# Patient Record
Sex: Male | Born: 1970
Health system: Southern US, Community
[De-identification: ages and names within clinical notes are randomized; demographics above are authoritative.]

## PROBLEM LIST (undated history)

## (undated) DIAGNOSIS — I10 Essential (primary) hypertension: Secondary | ICD-10-CM

## (undated) DIAGNOSIS — R0600 Dyspnea, unspecified: Secondary | ICD-10-CM

## (undated) DIAGNOSIS — N289 Disorder of kidney and ureter, unspecified: Secondary | ICD-10-CM

## (undated) DIAGNOSIS — E2740 Unspecified adrenocortical insufficiency: Secondary | ICD-10-CM

## (undated) DIAGNOSIS — D649 Anemia, unspecified: Secondary | ICD-10-CM

## (undated) DIAGNOSIS — T7840XA Allergy, unspecified, initial encounter: Secondary | ICD-10-CM

## (undated) DIAGNOSIS — Z9289 Personal history of other medical treatment: Secondary | ICD-10-CM

## (undated) DIAGNOSIS — K219 Gastro-esophageal reflux disease without esophagitis: Secondary | ICD-10-CM

## (undated) DIAGNOSIS — I071 Rheumatic tricuspid insufficiency: Secondary | ICD-10-CM

## (undated) DIAGNOSIS — N186 End stage renal disease: Secondary | ICD-10-CM

## (undated) DIAGNOSIS — N19 Unspecified kidney failure: Secondary | ICD-10-CM

## (undated) DIAGNOSIS — F419 Anxiety disorder, unspecified: Secondary | ICD-10-CM

## (undated) DIAGNOSIS — E7800 Pure hypercholesterolemia, unspecified: Secondary | ICD-10-CM

## (undated) DIAGNOSIS — Z992 Dependence on renal dialysis: Secondary | ICD-10-CM

## (undated) DIAGNOSIS — F32A Depression, unspecified: Secondary | ICD-10-CM

## (undated) DIAGNOSIS — I639 Cerebral infarction, unspecified: Secondary | ICD-10-CM

## (undated) DIAGNOSIS — I5020 Unspecified systolic (congestive) heart failure: Secondary | ICD-10-CM

## (undated) HISTORY — PX: INSERTION OF DIALYSIS CATHETER: SHX1324

## (undated) HISTORY — DX: Allergy, unspecified, initial encounter: T78.40XA

## (undated) HISTORY — DX: Anxiety disorder, unspecified: F41.9

## (undated) HISTORY — PX: AV FISTULA PLACEMENT: SHX1204

## (undated) HISTORY — PX: THYROIDECTOMY, PARTIAL: SHX18

## (undated) HISTORY — DX: Depression, unspecified: F32.A

---

## 2000-08-16 ENCOUNTER — Inpatient Hospital Stay (HOSPITAL_COMMUNITY): Admission: EM | Admit: 2000-08-16 | Discharge: 2000-08-21 | Payer: Self-pay | Admitting: Emergency Medicine

## 2000-08-17 ENCOUNTER — Encounter: Payer: Self-pay | Admitting: Internal Medicine

## 2000-08-18 ENCOUNTER — Encounter: Payer: Self-pay | Admitting: Internal Medicine

## 2000-12-24 ENCOUNTER — Ambulatory Visit (HOSPITAL_COMMUNITY): Admission: RE | Admit: 2000-12-24 | Discharge: 2000-12-24 | Payer: Self-pay | Admitting: *Deleted

## 2001-04-10 ENCOUNTER — Emergency Department (HOSPITAL_COMMUNITY): Admission: EM | Admit: 2001-04-10 | Discharge: 2001-04-10 | Payer: Self-pay | Admitting: Orthopedic Surgery

## 2002-12-07 ENCOUNTER — Encounter: Payer: Self-pay | Admitting: Nephrology

## 2002-12-07 ENCOUNTER — Encounter: Admission: RE | Admit: 2002-12-07 | Discharge: 2002-12-07 | Payer: Self-pay | Admitting: Nephrology

## 2003-02-19 ENCOUNTER — Emergency Department (HOSPITAL_COMMUNITY): Admission: EM | Admit: 2003-02-19 | Discharge: 2003-02-19 | Payer: Self-pay | Admitting: Emergency Medicine

## 2003-02-20 ENCOUNTER — Emergency Department (HOSPITAL_COMMUNITY): Admission: EM | Admit: 2003-02-20 | Discharge: 2003-02-20 | Payer: Self-pay | Admitting: Emergency Medicine

## 2003-07-28 ENCOUNTER — Emergency Department (HOSPITAL_COMMUNITY): Admission: EM | Admit: 2003-07-28 | Discharge: 2003-07-28 | Payer: Self-pay | Admitting: Emergency Medicine

## 2003-08-01 ENCOUNTER — Inpatient Hospital Stay (HOSPITAL_COMMUNITY): Admission: EM | Admit: 2003-08-01 | Discharge: 2003-08-04 | Payer: Self-pay | Admitting: Emergency Medicine

## 2003-08-10 ENCOUNTER — Ambulatory Visit (HOSPITAL_COMMUNITY): Admission: RE | Admit: 2003-08-10 | Discharge: 2003-08-10 | Payer: Self-pay | Admitting: Internal Medicine

## 2012-10-04 DIAGNOSIS — L03114 Cellulitis of left upper limb: Secondary | ICD-10-CM | POA: Insufficient documentation

## 2012-10-05 DIAGNOSIS — I82629 Acute embolism and thrombosis of deep veins of unspecified upper extremity: Secondary | ICD-10-CM | POA: Insufficient documentation

## 2012-10-06 DIAGNOSIS — I2699 Other pulmonary embolism without acute cor pulmonale: Secondary | ICD-10-CM | POA: Insufficient documentation

## 2012-10-11 DIAGNOSIS — G43909 Migraine, unspecified, not intractable, without status migrainosus: Secondary | ICD-10-CM | POA: Insufficient documentation

## 2013-03-30 DIAGNOSIS — A419 Sepsis, unspecified organism: Secondary | ICD-10-CM | POA: Insufficient documentation

## 2014-03-03 DIAGNOSIS — R634 Abnormal weight loss: Secondary | ICD-10-CM | POA: Insufficient documentation

## 2014-03-18 DIAGNOSIS — R590 Localized enlarged lymph nodes: Secondary | ICD-10-CM | POA: Insufficient documentation

## 2014-03-18 DIAGNOSIS — D638 Anemia in other chronic diseases classified elsewhere: Secondary | ICD-10-CM | POA: Insufficient documentation

## 2014-03-18 DIAGNOSIS — E871 Hypo-osmolality and hyponatremia: Secondary | ICD-10-CM | POA: Insufficient documentation

## 2014-07-05 DIAGNOSIS — M87059 Idiopathic aseptic necrosis of unspecified femur: Secondary | ICD-10-CM | POA: Insufficient documentation

## 2014-07-05 DIAGNOSIS — K56609 Unspecified intestinal obstruction, unspecified as to partial versus complete obstruction: Secondary | ICD-10-CM | POA: Insufficient documentation

## 2014-07-05 DIAGNOSIS — Z5181 Encounter for therapeutic drug level monitoring: Secondary | ICD-10-CM | POA: Insufficient documentation

## 2014-07-05 DIAGNOSIS — Z7901 Long term (current) use of anticoagulants: Secondary | ICD-10-CM

## 2014-07-05 DIAGNOSIS — R29898 Other symptoms and signs involving the musculoskeletal system: Secondary | ICD-10-CM | POA: Insufficient documentation

## 2014-11-04 DIAGNOSIS — I639 Cerebral infarction, unspecified: Secondary | ICD-10-CM

## 2014-11-04 HISTORY — PX: OTHER SURGICAL HISTORY: SHX169

## 2014-11-04 HISTORY — DX: Cerebral infarction, unspecified: I63.9

## 2014-11-21 DIAGNOSIS — R4182 Altered mental status, unspecified: Secondary | ICD-10-CM | POA: Diagnosis present

## 2014-11-21 DIAGNOSIS — N186 End stage renal disease: Secondary | ICD-10-CM | POA: Diagnosis present

## 2014-11-21 DIAGNOSIS — E162 Hypoglycemia, unspecified: Secondary | ICD-10-CM | POA: Diagnosis present

## 2015-07-13 DIAGNOSIS — S4412XA Injury of median nerve at upper arm level, left arm, initial encounter: Secondary | ICD-10-CM | POA: Insufficient documentation

## 2015-07-24 DIAGNOSIS — I2510 Atherosclerotic heart disease of native coronary artery without angina pectoris: Secondary | ICD-10-CM | POA: Insufficient documentation

## 2015-11-06 DIAGNOSIS — D631 Anemia in chronic kidney disease: Secondary | ICD-10-CM | POA: Diagnosis not present

## 2015-11-06 DIAGNOSIS — N186 End stage renal disease: Secondary | ICD-10-CM | POA: Diagnosis not present

## 2015-11-06 DIAGNOSIS — N2581 Secondary hyperparathyroidism of renal origin: Secondary | ICD-10-CM | POA: Diagnosis not present

## 2015-11-06 DIAGNOSIS — Z992 Dependence on renal dialysis: Secondary | ICD-10-CM | POA: Diagnosis not present

## 2015-11-10 DIAGNOSIS — D631 Anemia in chronic kidney disease: Secondary | ICD-10-CM | POA: Diagnosis not present

## 2015-11-10 DIAGNOSIS — Z992 Dependence on renal dialysis: Secondary | ICD-10-CM | POA: Diagnosis not present

## 2015-11-10 DIAGNOSIS — N186 End stage renal disease: Secondary | ICD-10-CM | POA: Diagnosis not present

## 2015-11-10 DIAGNOSIS — N2581 Secondary hyperparathyroidism of renal origin: Secondary | ICD-10-CM | POA: Diagnosis not present

## 2015-11-14 DIAGNOSIS — D631 Anemia in chronic kidney disease: Secondary | ICD-10-CM | POA: Diagnosis not present

## 2015-11-14 DIAGNOSIS — N2581 Secondary hyperparathyroidism of renal origin: Secondary | ICD-10-CM | POA: Diagnosis not present

## 2015-11-14 DIAGNOSIS — N509 Disorder of male genital organs, unspecified: Secondary | ICD-10-CM | POA: Diagnosis not present

## 2015-11-14 DIAGNOSIS — D509 Iron deficiency anemia, unspecified: Secondary | ICD-10-CM | POA: Diagnosis not present

## 2015-11-14 DIAGNOSIS — N186 End stage renal disease: Secondary | ICD-10-CM | POA: Diagnosis not present

## 2015-11-17 DIAGNOSIS — N2581 Secondary hyperparathyroidism of renal origin: Secondary | ICD-10-CM | POA: Diagnosis not present

## 2015-11-17 DIAGNOSIS — N186 End stage renal disease: Secondary | ICD-10-CM | POA: Diagnosis not present

## 2015-11-17 DIAGNOSIS — D509 Iron deficiency anemia, unspecified: Secondary | ICD-10-CM | POA: Diagnosis not present

## 2015-11-17 DIAGNOSIS — D631 Anemia in chronic kidney disease: Secondary | ICD-10-CM | POA: Diagnosis not present

## 2015-11-20 DIAGNOSIS — N2581 Secondary hyperparathyroidism of renal origin: Secondary | ICD-10-CM | POA: Diagnosis not present

## 2015-11-20 DIAGNOSIS — N186 End stage renal disease: Secondary | ICD-10-CM | POA: Diagnosis not present

## 2015-11-20 DIAGNOSIS — D509 Iron deficiency anemia, unspecified: Secondary | ICD-10-CM | POA: Diagnosis not present

## 2015-11-20 DIAGNOSIS — D631 Anemia in chronic kidney disease: Secondary | ICD-10-CM | POA: Diagnosis not present

## 2015-11-22 DIAGNOSIS — D631 Anemia in chronic kidney disease: Secondary | ICD-10-CM | POA: Diagnosis not present

## 2015-11-22 DIAGNOSIS — D509 Iron deficiency anemia, unspecified: Secondary | ICD-10-CM | POA: Diagnosis not present

## 2015-11-22 DIAGNOSIS — N186 End stage renal disease: Secondary | ICD-10-CM | POA: Diagnosis not present

## 2015-11-22 DIAGNOSIS — E039 Hypothyroidism, unspecified: Secondary | ICD-10-CM | POA: Diagnosis not present

## 2015-11-22 DIAGNOSIS — N2581 Secondary hyperparathyroidism of renal origin: Secondary | ICD-10-CM | POA: Diagnosis not present

## 2015-11-24 DIAGNOSIS — D631 Anemia in chronic kidney disease: Secondary | ICD-10-CM | POA: Diagnosis not present

## 2015-11-24 DIAGNOSIS — N186 End stage renal disease: Secondary | ICD-10-CM | POA: Diagnosis not present

## 2015-11-24 DIAGNOSIS — N2581 Secondary hyperparathyroidism of renal origin: Secondary | ICD-10-CM | POA: Diagnosis not present

## 2015-11-24 DIAGNOSIS — D509 Iron deficiency anemia, unspecified: Secondary | ICD-10-CM | POA: Diagnosis not present

## 2015-11-27 DIAGNOSIS — N186 End stage renal disease: Secondary | ICD-10-CM | POA: Diagnosis not present

## 2015-11-27 DIAGNOSIS — N2581 Secondary hyperparathyroidism of renal origin: Secondary | ICD-10-CM | POA: Diagnosis not present

## 2015-11-27 DIAGNOSIS — D631 Anemia in chronic kidney disease: Secondary | ICD-10-CM | POA: Diagnosis not present

## 2015-11-27 DIAGNOSIS — D509 Iron deficiency anemia, unspecified: Secondary | ICD-10-CM | POA: Diagnosis not present

## 2015-11-28 DIAGNOSIS — S4402XD Injury of ulnar nerve at upper arm level, left arm, subsequent encounter: Secondary | ICD-10-CM | POA: Diagnosis not present

## 2015-11-28 DIAGNOSIS — S5402XD Injury of ulnar nerve at forearm level, left arm, subsequent encounter: Secondary | ICD-10-CM | POA: Diagnosis not present

## 2015-11-28 DIAGNOSIS — R29898 Other symptoms and signs involving the musculoskeletal system: Secondary | ICD-10-CM | POA: Diagnosis not present

## 2015-11-28 DIAGNOSIS — G5631 Lesion of radial nerve, right upper limb: Secondary | ICD-10-CM | POA: Diagnosis not present

## 2015-11-28 DIAGNOSIS — M6281 Muscle weakness (generalized): Secondary | ICD-10-CM | POA: Diagnosis not present

## 2015-11-28 DIAGNOSIS — I728 Aneurysm of other specified arteries: Secondary | ICD-10-CM | POA: Diagnosis not present

## 2015-11-29 DIAGNOSIS — D631 Anemia in chronic kidney disease: Secondary | ICD-10-CM | POA: Diagnosis not present

## 2015-11-29 DIAGNOSIS — D509 Iron deficiency anemia, unspecified: Secondary | ICD-10-CM | POA: Diagnosis not present

## 2015-11-29 DIAGNOSIS — N186 End stage renal disease: Secondary | ICD-10-CM | POA: Diagnosis not present

## 2015-11-29 DIAGNOSIS — N509 Disorder of male genital organs, unspecified: Secondary | ICD-10-CM | POA: Diagnosis not present

## 2015-11-29 DIAGNOSIS — N2581 Secondary hyperparathyroidism of renal origin: Secondary | ICD-10-CM | POA: Diagnosis not present

## 2015-11-30 DIAGNOSIS — N433 Hydrocele, unspecified: Secondary | ICD-10-CM | POA: Diagnosis not present

## 2015-12-01 DIAGNOSIS — N2581 Secondary hyperparathyroidism of renal origin: Secondary | ICD-10-CM | POA: Diagnosis not present

## 2015-12-01 DIAGNOSIS — N186 End stage renal disease: Secondary | ICD-10-CM | POA: Diagnosis not present

## 2015-12-01 DIAGNOSIS — D509 Iron deficiency anemia, unspecified: Secondary | ICD-10-CM | POA: Diagnosis not present

## 2015-12-01 DIAGNOSIS — D631 Anemia in chronic kidney disease: Secondary | ICD-10-CM | POA: Diagnosis not present

## 2015-12-01 DIAGNOSIS — N433 Hydrocele, unspecified: Secondary | ICD-10-CM | POA: Insufficient documentation

## 2015-12-04 DIAGNOSIS — N186 End stage renal disease: Secondary | ICD-10-CM | POA: Diagnosis not present

## 2015-12-04 DIAGNOSIS — D509 Iron deficiency anemia, unspecified: Secondary | ICD-10-CM | POA: Diagnosis not present

## 2015-12-04 DIAGNOSIS — D631 Anemia in chronic kidney disease: Secondary | ICD-10-CM | POA: Diagnosis not present

## 2015-12-04 DIAGNOSIS — N2581 Secondary hyperparathyroidism of renal origin: Secondary | ICD-10-CM | POA: Diagnosis not present

## 2015-12-05 DIAGNOSIS — Z992 Dependence on renal dialysis: Secondary | ICD-10-CM | POA: Diagnosis not present

## 2015-12-05 DIAGNOSIS — N186 End stage renal disease: Secondary | ICD-10-CM | POA: Diagnosis not present

## 2015-12-05 DIAGNOSIS — I120 Hypertensive chronic kidney disease with stage 5 chronic kidney disease or end stage renal disease: Secondary | ICD-10-CM | POA: Diagnosis not present

## 2015-12-08 DIAGNOSIS — D631 Anemia in chronic kidney disease: Secondary | ICD-10-CM | POA: Diagnosis not present

## 2015-12-08 DIAGNOSIS — D509 Iron deficiency anemia, unspecified: Secondary | ICD-10-CM | POA: Diagnosis not present

## 2015-12-08 DIAGNOSIS — N186 End stage renal disease: Secondary | ICD-10-CM | POA: Diagnosis not present

## 2015-12-08 DIAGNOSIS — N2581 Secondary hyperparathyroidism of renal origin: Secondary | ICD-10-CM | POA: Diagnosis not present

## 2015-12-11 DIAGNOSIS — N186 End stage renal disease: Secondary | ICD-10-CM | POA: Diagnosis not present

## 2015-12-11 DIAGNOSIS — N2581 Secondary hyperparathyroidism of renal origin: Secondary | ICD-10-CM | POA: Diagnosis not present

## 2015-12-11 DIAGNOSIS — D631 Anemia in chronic kidney disease: Secondary | ICD-10-CM | POA: Diagnosis not present

## 2015-12-11 DIAGNOSIS — D509 Iron deficiency anemia, unspecified: Secondary | ICD-10-CM | POA: Diagnosis not present

## 2015-12-13 DIAGNOSIS — N186 End stage renal disease: Secondary | ICD-10-CM | POA: Diagnosis not present

## 2015-12-13 DIAGNOSIS — N2581 Secondary hyperparathyroidism of renal origin: Secondary | ICD-10-CM | POA: Diagnosis not present

## 2015-12-13 DIAGNOSIS — D631 Anemia in chronic kidney disease: Secondary | ICD-10-CM | POA: Diagnosis not present

## 2015-12-13 DIAGNOSIS — D509 Iron deficiency anemia, unspecified: Secondary | ICD-10-CM | POA: Diagnosis not present

## 2015-12-15 DIAGNOSIS — N186 End stage renal disease: Secondary | ICD-10-CM | POA: Diagnosis not present

## 2015-12-15 DIAGNOSIS — D631 Anemia in chronic kidney disease: Secondary | ICD-10-CM | POA: Diagnosis not present

## 2015-12-15 DIAGNOSIS — D509 Iron deficiency anemia, unspecified: Secondary | ICD-10-CM | POA: Diagnosis not present

## 2015-12-15 DIAGNOSIS — N2581 Secondary hyperparathyroidism of renal origin: Secondary | ICD-10-CM | POA: Diagnosis not present

## 2015-12-18 DIAGNOSIS — N186 End stage renal disease: Secondary | ICD-10-CM | POA: Diagnosis not present

## 2015-12-18 DIAGNOSIS — N2581 Secondary hyperparathyroidism of renal origin: Secondary | ICD-10-CM | POA: Diagnosis not present

## 2015-12-18 DIAGNOSIS — D509 Iron deficiency anemia, unspecified: Secondary | ICD-10-CM | POA: Diagnosis not present

## 2015-12-18 DIAGNOSIS — D631 Anemia in chronic kidney disease: Secondary | ICD-10-CM | POA: Diagnosis not present

## 2015-12-22 DIAGNOSIS — D509 Iron deficiency anemia, unspecified: Secondary | ICD-10-CM | POA: Diagnosis not present

## 2015-12-22 DIAGNOSIS — E039 Hypothyroidism, unspecified: Secondary | ICD-10-CM | POA: Diagnosis not present

## 2015-12-22 DIAGNOSIS — N186 End stage renal disease: Secondary | ICD-10-CM | POA: Diagnosis not present

## 2015-12-22 DIAGNOSIS — N2581 Secondary hyperparathyroidism of renal origin: Secondary | ICD-10-CM | POA: Diagnosis not present

## 2015-12-22 DIAGNOSIS — D631 Anemia in chronic kidney disease: Secondary | ICD-10-CM | POA: Diagnosis not present

## 2015-12-25 DIAGNOSIS — D509 Iron deficiency anemia, unspecified: Secondary | ICD-10-CM | POA: Diagnosis not present

## 2015-12-25 DIAGNOSIS — D631 Anemia in chronic kidney disease: Secondary | ICD-10-CM | POA: Diagnosis not present

## 2015-12-25 DIAGNOSIS — N2581 Secondary hyperparathyroidism of renal origin: Secondary | ICD-10-CM | POA: Diagnosis not present

## 2015-12-25 DIAGNOSIS — N186 End stage renal disease: Secondary | ICD-10-CM | POA: Diagnosis not present

## 2015-12-26 DIAGNOSIS — K219 Gastro-esophageal reflux disease without esophagitis: Secondary | ICD-10-CM | POA: Diagnosis not present

## 2015-12-26 DIAGNOSIS — Z01818 Encounter for other preprocedural examination: Secondary | ICD-10-CM | POA: Diagnosis not present

## 2015-12-26 DIAGNOSIS — N433 Hydrocele, unspecified: Secondary | ICD-10-CM | POA: Diagnosis not present

## 2015-12-26 DIAGNOSIS — N186 End stage renal disease: Secondary | ICD-10-CM | POA: Diagnosis not present

## 2015-12-26 DIAGNOSIS — I10 Essential (primary) hypertension: Secondary | ICD-10-CM | POA: Diagnosis not present

## 2015-12-26 DIAGNOSIS — F17210 Nicotine dependence, cigarettes, uncomplicated: Secondary | ICD-10-CM | POA: Diagnosis not present

## 2015-12-26 DIAGNOSIS — I120 Hypertensive chronic kidney disease with stage 5 chronic kidney disease or end stage renal disease: Secondary | ICD-10-CM | POA: Diagnosis not present

## 2015-12-26 DIAGNOSIS — Z992 Dependence on renal dialysis: Secondary | ICD-10-CM | POA: Diagnosis not present

## 2015-12-26 DIAGNOSIS — Z79899 Other long term (current) drug therapy: Secondary | ICD-10-CM | POA: Diagnosis not present

## 2015-12-26 DIAGNOSIS — Z8673 Personal history of transient ischemic attack (TIA), and cerebral infarction without residual deficits: Secondary | ICD-10-CM | POA: Diagnosis not present

## 2015-12-27 DIAGNOSIS — N2581 Secondary hyperparathyroidism of renal origin: Secondary | ICD-10-CM | POA: Diagnosis not present

## 2015-12-27 DIAGNOSIS — D509 Iron deficiency anemia, unspecified: Secondary | ICD-10-CM | POA: Diagnosis not present

## 2015-12-27 DIAGNOSIS — N186 End stage renal disease: Secondary | ICD-10-CM | POA: Diagnosis not present

## 2015-12-27 DIAGNOSIS — D631 Anemia in chronic kidney disease: Secondary | ICD-10-CM | POA: Diagnosis not present

## 2015-12-29 DIAGNOSIS — D509 Iron deficiency anemia, unspecified: Secondary | ICD-10-CM | POA: Diagnosis not present

## 2015-12-29 DIAGNOSIS — D631 Anemia in chronic kidney disease: Secondary | ICD-10-CM | POA: Diagnosis not present

## 2015-12-29 DIAGNOSIS — N186 End stage renal disease: Secondary | ICD-10-CM | POA: Diagnosis not present

## 2015-12-29 DIAGNOSIS — N2581 Secondary hyperparathyroidism of renal origin: Secondary | ICD-10-CM | POA: Diagnosis not present

## 2016-01-01 DIAGNOSIS — D631 Anemia in chronic kidney disease: Secondary | ICD-10-CM | POA: Diagnosis not present

## 2016-01-01 DIAGNOSIS — D509 Iron deficiency anemia, unspecified: Secondary | ICD-10-CM | POA: Diagnosis not present

## 2016-01-01 DIAGNOSIS — N186 End stage renal disease: Secondary | ICD-10-CM | POA: Diagnosis not present

## 2016-01-01 DIAGNOSIS — N2581 Secondary hyperparathyroidism of renal origin: Secondary | ICD-10-CM | POA: Diagnosis not present

## 2016-01-02 DIAGNOSIS — Z992 Dependence on renal dialysis: Secondary | ICD-10-CM | POA: Diagnosis not present

## 2016-01-02 DIAGNOSIS — N186 End stage renal disease: Secondary | ICD-10-CM | POA: Diagnosis not present

## 2016-01-02 DIAGNOSIS — I120 Hypertensive chronic kidney disease with stage 5 chronic kidney disease or end stage renal disease: Secondary | ICD-10-CM | POA: Diagnosis not present

## 2016-01-03 DIAGNOSIS — N186 End stage renal disease: Secondary | ICD-10-CM | POA: Diagnosis not present

## 2016-01-03 DIAGNOSIS — R509 Fever, unspecified: Secondary | ICD-10-CM | POA: Diagnosis not present

## 2016-01-03 DIAGNOSIS — A499 Bacterial infection, unspecified: Secondary | ICD-10-CM | POA: Diagnosis not present

## 2016-01-03 DIAGNOSIS — R7881 Bacteremia: Secondary | ICD-10-CM | POA: Diagnosis not present

## 2016-01-03 DIAGNOSIS — N2581 Secondary hyperparathyroidism of renal origin: Secondary | ICD-10-CM | POA: Diagnosis not present

## 2016-01-03 DIAGNOSIS — D631 Anemia in chronic kidney disease: Secondary | ICD-10-CM | POA: Diagnosis not present

## 2016-01-03 DIAGNOSIS — D509 Iron deficiency anemia, unspecified: Secondary | ICD-10-CM | POA: Diagnosis not present

## 2016-01-05 DIAGNOSIS — R7881 Bacteremia: Secondary | ICD-10-CM | POA: Diagnosis not present

## 2016-01-05 DIAGNOSIS — N186 End stage renal disease: Secondary | ICD-10-CM | POA: Diagnosis not present

## 2016-01-05 DIAGNOSIS — D631 Anemia in chronic kidney disease: Secondary | ICD-10-CM | POA: Diagnosis not present

## 2016-01-05 DIAGNOSIS — A499 Bacterial infection, unspecified: Secondary | ICD-10-CM | POA: Diagnosis not present

## 2016-01-05 DIAGNOSIS — R509 Fever, unspecified: Secondary | ICD-10-CM | POA: Diagnosis not present

## 2016-01-05 DIAGNOSIS — N2581 Secondary hyperparathyroidism of renal origin: Secondary | ICD-10-CM | POA: Diagnosis not present

## 2016-01-08 DIAGNOSIS — N2581 Secondary hyperparathyroidism of renal origin: Secondary | ICD-10-CM | POA: Diagnosis not present

## 2016-01-08 DIAGNOSIS — A499 Bacterial infection, unspecified: Secondary | ICD-10-CM | POA: Diagnosis not present

## 2016-01-08 DIAGNOSIS — D631 Anemia in chronic kidney disease: Secondary | ICD-10-CM | POA: Diagnosis not present

## 2016-01-08 DIAGNOSIS — R7881 Bacteremia: Secondary | ICD-10-CM | POA: Diagnosis not present

## 2016-01-08 DIAGNOSIS — N186 End stage renal disease: Secondary | ICD-10-CM | POA: Diagnosis not present

## 2016-01-08 DIAGNOSIS — R509 Fever, unspecified: Secondary | ICD-10-CM | POA: Diagnosis not present

## 2016-01-11 DIAGNOSIS — N2581 Secondary hyperparathyroidism of renal origin: Secondary | ICD-10-CM | POA: Diagnosis not present

## 2016-01-11 DIAGNOSIS — R7881 Bacteremia: Secondary | ICD-10-CM | POA: Diagnosis not present

## 2016-01-11 DIAGNOSIS — N186 End stage renal disease: Secondary | ICD-10-CM | POA: Diagnosis not present

## 2016-01-11 DIAGNOSIS — D631 Anemia in chronic kidney disease: Secondary | ICD-10-CM | POA: Diagnosis not present

## 2016-01-11 DIAGNOSIS — R509 Fever, unspecified: Secondary | ICD-10-CM | POA: Diagnosis not present

## 2016-01-11 DIAGNOSIS — A499 Bacterial infection, unspecified: Secondary | ICD-10-CM | POA: Diagnosis not present

## 2016-01-12 DIAGNOSIS — Z885 Allergy status to narcotic agent status: Secondary | ICD-10-CM | POA: Diagnosis not present

## 2016-01-12 DIAGNOSIS — Z7982 Long term (current) use of aspirin: Secondary | ICD-10-CM | POA: Diagnosis not present

## 2016-01-12 DIAGNOSIS — K219 Gastro-esophageal reflux disease without esophagitis: Secondary | ICD-10-CM | POA: Diagnosis not present

## 2016-01-12 DIAGNOSIS — Z79899 Other long term (current) drug therapy: Secondary | ICD-10-CM | POA: Diagnosis not present

## 2016-01-12 DIAGNOSIS — Z888 Allergy status to other drugs, medicaments and biological substances status: Secondary | ICD-10-CM | POA: Diagnosis not present

## 2016-01-12 DIAGNOSIS — R590 Localized enlarged lymph nodes: Secondary | ICD-10-CM | POA: Diagnosis not present

## 2016-01-12 DIAGNOSIS — Z79891 Long term (current) use of opiate analgesic: Secondary | ICD-10-CM | POA: Diagnosis not present

## 2016-01-12 DIAGNOSIS — I509 Heart failure, unspecified: Secondary | ICD-10-CM | POA: Diagnosis not present

## 2016-01-12 DIAGNOSIS — I2510 Atherosclerotic heart disease of native coronary artery without angina pectoris: Secondary | ICD-10-CM | POA: Diagnosis not present

## 2016-01-12 DIAGNOSIS — R911 Solitary pulmonary nodule: Secondary | ICD-10-CM | POA: Diagnosis not present

## 2016-01-12 DIAGNOSIS — F17200 Nicotine dependence, unspecified, uncomplicated: Secondary | ICD-10-CM | POA: Diagnosis not present

## 2016-01-12 DIAGNOSIS — Z86718 Personal history of other venous thrombosis and embolism: Secondary | ICD-10-CM | POA: Diagnosis not present

## 2016-01-12 DIAGNOSIS — I132 Hypertensive heart and chronic kidney disease with heart failure and with stage 5 chronic kidney disease, or end stage renal disease: Secondary | ICD-10-CM | POA: Diagnosis not present

## 2016-01-12 DIAGNOSIS — Z79818 Long term (current) use of other agents affecting estrogen receptors and estrogen levels: Secondary | ICD-10-CM | POA: Diagnosis not present

## 2016-01-12 DIAGNOSIS — I861 Scrotal varices: Secondary | ICD-10-CM | POA: Diagnosis not present

## 2016-01-12 DIAGNOSIS — I722 Aneurysm of renal artery: Secondary | ICD-10-CM | POA: Diagnosis not present

## 2016-01-12 DIAGNOSIS — N5089 Other specified disorders of the male genital organs: Secondary | ICD-10-CM | POA: Diagnosis not present

## 2016-01-12 DIAGNOSIS — D631 Anemia in chronic kidney disease: Secondary | ICD-10-CM | POA: Diagnosis not present

## 2016-01-12 DIAGNOSIS — N509 Disorder of male genital organs, unspecified: Secondary | ICD-10-CM | POA: Diagnosis not present

## 2016-01-12 DIAGNOSIS — Z992 Dependence on renal dialysis: Secondary | ICD-10-CM | POA: Diagnosis not present

## 2016-01-12 DIAGNOSIS — I071 Rheumatic tricuspid insufficiency: Secondary | ICD-10-CM | POA: Diagnosis not present

## 2016-01-12 DIAGNOSIS — I120 Hypertensive chronic kidney disease with stage 5 chronic kidney disease or end stage renal disease: Secondary | ICD-10-CM | POA: Diagnosis not present

## 2016-01-12 DIAGNOSIS — D472 Monoclonal gammopathy: Secondary | ICD-10-CM | POA: Diagnosis not present

## 2016-01-12 DIAGNOSIS — I5020 Unspecified systolic (congestive) heart failure: Secondary | ICD-10-CM | POA: Diagnosis not present

## 2016-01-12 DIAGNOSIS — M300 Polyarteritis nodosa: Secondary | ICD-10-CM | POA: Diagnosis not present

## 2016-01-12 DIAGNOSIS — N186 End stage renal disease: Secondary | ICD-10-CM | POA: Diagnosis not present

## 2016-01-12 DIAGNOSIS — N4611 Organic oligospermia: Secondary | ICD-10-CM | POA: Diagnosis not present

## 2016-01-12 DIAGNOSIS — I776 Arteritis, unspecified: Secondary | ICD-10-CM | POA: Diagnosis not present

## 2016-01-12 DIAGNOSIS — N433 Hydrocele, unspecified: Secondary | ICD-10-CM | POA: Diagnosis not present

## 2016-01-12 DIAGNOSIS — G894 Chronic pain syndrome: Secondary | ICD-10-CM | POA: Diagnosis not present

## 2016-01-13 DIAGNOSIS — N186 End stage renal disease: Secondary | ICD-10-CM | POA: Diagnosis not present

## 2016-01-13 DIAGNOSIS — Z79891 Long term (current) use of opiate analgesic: Secondary | ICD-10-CM | POA: Diagnosis not present

## 2016-01-13 DIAGNOSIS — Z992 Dependence on renal dialysis: Secondary | ICD-10-CM | POA: Diagnosis not present

## 2016-01-13 DIAGNOSIS — I120 Hypertensive chronic kidney disease with stage 5 chronic kidney disease or end stage renal disease: Secondary | ICD-10-CM | POA: Diagnosis not present

## 2016-01-13 DIAGNOSIS — N5089 Other specified disorders of the male genital organs: Secondary | ICD-10-CM | POA: Diagnosis not present

## 2016-01-13 DIAGNOSIS — R590 Localized enlarged lymph nodes: Secondary | ICD-10-CM | POA: Diagnosis not present

## 2016-01-13 DIAGNOSIS — D631 Anemia in chronic kidney disease: Secondary | ICD-10-CM | POA: Diagnosis not present

## 2016-01-13 DIAGNOSIS — N4611 Organic oligospermia: Secondary | ICD-10-CM | POA: Diagnosis not present

## 2016-01-13 DIAGNOSIS — N433 Hydrocele, unspecified: Secondary | ICD-10-CM | POA: Diagnosis not present

## 2016-01-15 DIAGNOSIS — R509 Fever, unspecified: Secondary | ICD-10-CM | POA: Diagnosis not present

## 2016-01-15 DIAGNOSIS — R7881 Bacteremia: Secondary | ICD-10-CM | POA: Diagnosis not present

## 2016-01-15 DIAGNOSIS — N186 End stage renal disease: Secondary | ICD-10-CM | POA: Diagnosis not present

## 2016-01-15 DIAGNOSIS — A499 Bacterial infection, unspecified: Secondary | ICD-10-CM | POA: Diagnosis not present

## 2016-01-15 DIAGNOSIS — D631 Anemia in chronic kidney disease: Secondary | ICD-10-CM | POA: Diagnosis not present

## 2016-01-15 DIAGNOSIS — N2581 Secondary hyperparathyroidism of renal origin: Secondary | ICD-10-CM | POA: Diagnosis not present

## 2016-01-19 DIAGNOSIS — R509 Fever, unspecified: Secondary | ICD-10-CM | POA: Diagnosis not present

## 2016-01-19 DIAGNOSIS — R7881 Bacteremia: Secondary | ICD-10-CM | POA: Diagnosis not present

## 2016-01-19 DIAGNOSIS — E039 Hypothyroidism, unspecified: Secondary | ICD-10-CM | POA: Diagnosis not present

## 2016-01-19 DIAGNOSIS — N186 End stage renal disease: Secondary | ICD-10-CM | POA: Diagnosis not present

## 2016-01-19 DIAGNOSIS — N2581 Secondary hyperparathyroidism of renal origin: Secondary | ICD-10-CM | POA: Diagnosis not present

## 2016-01-19 DIAGNOSIS — A499 Bacterial infection, unspecified: Secondary | ICD-10-CM | POA: Diagnosis not present

## 2016-01-19 DIAGNOSIS — D631 Anemia in chronic kidney disease: Secondary | ICD-10-CM | POA: Diagnosis not present

## 2016-01-22 DIAGNOSIS — N186 End stage renal disease: Secondary | ICD-10-CM

## 2016-01-22 DIAGNOSIS — N2581 Secondary hyperparathyroidism of renal origin: Secondary | ICD-10-CM | POA: Diagnosis not present

## 2016-01-22 DIAGNOSIS — I119 Hypertensive heart disease without heart failure: Secondary | ICD-10-CM | POA: Insufficient documentation

## 2016-01-22 DIAGNOSIS — D631 Anemia in chronic kidney disease: Secondary | ICD-10-CM | POA: Diagnosis not present

## 2016-01-22 DIAGNOSIS — R7881 Bacteremia: Secondary | ICD-10-CM | POA: Diagnosis not present

## 2016-01-22 DIAGNOSIS — R509 Fever, unspecified: Secondary | ICD-10-CM | POA: Diagnosis not present

## 2016-01-22 DIAGNOSIS — A499 Bacterial infection, unspecified: Secondary | ICD-10-CM | POA: Diagnosis not present

## 2016-01-22 DIAGNOSIS — B9561 Methicillin susceptible Staphylococcus aureus infection as the cause of diseases classified elsewhere: Secondary | ICD-10-CM | POA: Insufficient documentation

## 2016-01-22 DIAGNOSIS — I43 Cardiomyopathy in diseases classified elsewhere: Secondary | ICD-10-CM

## 2016-01-22 DIAGNOSIS — I120 Hypertensive chronic kidney disease with stage 5 chronic kidney disease or end stage renal disease: Secondary | ICD-10-CM | POA: Insufficient documentation

## 2016-01-23 DIAGNOSIS — Z992 Dependence on renal dialysis: Secondary | ICD-10-CM | POA: Diagnosis not present

## 2016-01-23 DIAGNOSIS — Z7982 Long term (current) use of aspirin: Secondary | ICD-10-CM | POA: Diagnosis not present

## 2016-01-23 DIAGNOSIS — I2510 Atherosclerotic heart disease of native coronary artery without angina pectoris: Secondary | ICD-10-CM | POA: Diagnosis present

## 2016-01-23 DIAGNOSIS — N186 End stage renal disease: Secondary | ICD-10-CM | POA: Diagnosis not present

## 2016-01-23 DIAGNOSIS — F17200 Nicotine dependence, unspecified, uncomplicated: Secondary | ICD-10-CM | POA: Diagnosis present

## 2016-01-23 DIAGNOSIS — D631 Anemia in chronic kidney disease: Secondary | ICD-10-CM | POA: Diagnosis not present

## 2016-01-23 DIAGNOSIS — T80211A Bloodstream infection due to central venous catheter, initial encounter: Secondary | ICD-10-CM | POA: Diagnosis present

## 2016-01-23 DIAGNOSIS — D472 Monoclonal gammopathy: Secondary | ICD-10-CM | POA: Diagnosis present

## 2016-01-23 DIAGNOSIS — Z4901 Encounter for fitting and adjustment of extracorporeal dialysis catheter: Secondary | ICD-10-CM | POA: Diagnosis not present

## 2016-01-23 DIAGNOSIS — I120 Hypertensive chronic kidney disease with stage 5 chronic kidney disease or end stage renal disease: Secondary | ICD-10-CM | POA: Diagnosis not present

## 2016-01-23 DIAGNOSIS — E2740 Unspecified adrenocortical insufficiency: Secondary | ICD-10-CM | POA: Diagnosis present

## 2016-01-23 DIAGNOSIS — K7460 Unspecified cirrhosis of liver: Secondary | ICD-10-CM | POA: Diagnosis present

## 2016-01-23 DIAGNOSIS — I071 Rheumatic tricuspid insufficiency: Secondary | ICD-10-CM | POA: Diagnosis not present

## 2016-01-23 DIAGNOSIS — B957 Other staphylococcus as the cause of diseases classified elsewhere: Secondary | ICD-10-CM | POA: Diagnosis present

## 2016-01-23 DIAGNOSIS — I43 Cardiomyopathy in diseases classified elsewhere: Secondary | ICD-10-CM | POA: Diagnosis present

## 2016-01-23 DIAGNOSIS — A411 Sepsis due to other specified staphylococcus: Secondary | ICD-10-CM | POA: Diagnosis not present

## 2016-01-23 DIAGNOSIS — Z79899 Other long term (current) drug therapy: Secondary | ICD-10-CM | POA: Diagnosis not present

## 2016-01-23 DIAGNOSIS — I1311 Hypertensive heart and chronic kidney disease without heart failure, with stage 5 chronic kidney disease, or end stage renal disease: Secondary | ICD-10-CM | POA: Diagnosis present

## 2016-01-23 DIAGNOSIS — Z7902 Long term (current) use of antithrombotics/antiplatelets: Secondary | ICD-10-CM | POA: Diagnosis not present

## 2016-01-23 DIAGNOSIS — R7881 Bacteremia: Secondary | ICD-10-CM | POA: Diagnosis not present

## 2016-01-29 DIAGNOSIS — N186 End stage renal disease: Secondary | ICD-10-CM | POA: Diagnosis not present

## 2016-01-29 DIAGNOSIS — A499 Bacterial infection, unspecified: Secondary | ICD-10-CM | POA: Diagnosis not present

## 2016-01-29 DIAGNOSIS — R7881 Bacteremia: Secondary | ICD-10-CM | POA: Diagnosis not present

## 2016-01-29 DIAGNOSIS — D631 Anemia in chronic kidney disease: Secondary | ICD-10-CM | POA: Diagnosis not present

## 2016-01-29 DIAGNOSIS — N2581 Secondary hyperparathyroidism of renal origin: Secondary | ICD-10-CM | POA: Diagnosis not present

## 2016-01-29 DIAGNOSIS — R509 Fever, unspecified: Secondary | ICD-10-CM | POA: Diagnosis not present

## 2016-01-31 DIAGNOSIS — R7881 Bacteremia: Secondary | ICD-10-CM | POA: Diagnosis not present

## 2016-01-31 DIAGNOSIS — A499 Bacterial infection, unspecified: Secondary | ICD-10-CM | POA: Diagnosis not present

## 2016-01-31 DIAGNOSIS — D631 Anemia in chronic kidney disease: Secondary | ICD-10-CM | POA: Diagnosis not present

## 2016-01-31 DIAGNOSIS — R509 Fever, unspecified: Secondary | ICD-10-CM | POA: Diagnosis not present

## 2016-01-31 DIAGNOSIS — N2581 Secondary hyperparathyroidism of renal origin: Secondary | ICD-10-CM | POA: Diagnosis not present

## 2016-01-31 DIAGNOSIS — N186 End stage renal disease: Secondary | ICD-10-CM | POA: Diagnosis not present

## 2016-02-02 DIAGNOSIS — R7881 Bacteremia: Secondary | ICD-10-CM | POA: Diagnosis not present

## 2016-02-02 DIAGNOSIS — N2581 Secondary hyperparathyroidism of renal origin: Secondary | ICD-10-CM | POA: Diagnosis not present

## 2016-02-02 DIAGNOSIS — A499 Bacterial infection, unspecified: Secondary | ICD-10-CM | POA: Diagnosis not present

## 2016-02-02 DIAGNOSIS — N186 End stage renal disease: Secondary | ICD-10-CM | POA: Diagnosis not present

## 2016-02-02 DIAGNOSIS — D631 Anemia in chronic kidney disease: Secondary | ICD-10-CM | POA: Diagnosis not present

## 2016-02-02 DIAGNOSIS — I120 Hypertensive chronic kidney disease with stage 5 chronic kidney disease or end stage renal disease: Secondary | ICD-10-CM | POA: Diagnosis not present

## 2016-02-02 DIAGNOSIS — Z992 Dependence on renal dialysis: Secondary | ICD-10-CM | POA: Diagnosis not present

## 2016-02-02 DIAGNOSIS — R509 Fever, unspecified: Secondary | ICD-10-CM | POA: Diagnosis not present

## 2016-02-06 DIAGNOSIS — R7881 Bacteremia: Secondary | ICD-10-CM | POA: Diagnosis not present

## 2016-02-06 DIAGNOSIS — N186 End stage renal disease: Secondary | ICD-10-CM | POA: Diagnosis not present

## 2016-02-06 DIAGNOSIS — A499 Bacterial infection, unspecified: Secondary | ICD-10-CM | POA: Diagnosis not present

## 2016-02-06 DIAGNOSIS — D631 Anemia in chronic kidney disease: Secondary | ICD-10-CM | POA: Diagnosis not present

## 2016-02-06 DIAGNOSIS — N2581 Secondary hyperparathyroidism of renal origin: Secondary | ICD-10-CM | POA: Diagnosis not present

## 2016-02-07 DIAGNOSIS — A499 Bacterial infection, unspecified: Secondary | ICD-10-CM | POA: Diagnosis not present

## 2016-02-07 DIAGNOSIS — D631 Anemia in chronic kidney disease: Secondary | ICD-10-CM | POA: Diagnosis not present

## 2016-02-07 DIAGNOSIS — N2581 Secondary hyperparathyroidism of renal origin: Secondary | ICD-10-CM | POA: Diagnosis not present

## 2016-02-07 DIAGNOSIS — N186 End stage renal disease: Secondary | ICD-10-CM | POA: Diagnosis not present

## 2016-02-07 DIAGNOSIS — R7881 Bacteremia: Secondary | ICD-10-CM | POA: Diagnosis not present

## 2016-02-09 DIAGNOSIS — D631 Anemia in chronic kidney disease: Secondary | ICD-10-CM | POA: Diagnosis not present

## 2016-02-09 DIAGNOSIS — N2581 Secondary hyperparathyroidism of renal origin: Secondary | ICD-10-CM | POA: Diagnosis not present

## 2016-02-09 DIAGNOSIS — N186 End stage renal disease: Secondary | ICD-10-CM | POA: Diagnosis not present

## 2016-02-09 DIAGNOSIS — R7881 Bacteremia: Secondary | ICD-10-CM | POA: Diagnosis not present

## 2016-02-09 DIAGNOSIS — A499 Bacterial infection, unspecified: Secondary | ICD-10-CM | POA: Diagnosis not present

## 2016-02-12 DIAGNOSIS — R7881 Bacteremia: Secondary | ICD-10-CM | POA: Diagnosis not present

## 2016-02-12 DIAGNOSIS — N2581 Secondary hyperparathyroidism of renal origin: Secondary | ICD-10-CM | POA: Diagnosis not present

## 2016-02-12 DIAGNOSIS — D631 Anemia in chronic kidney disease: Secondary | ICD-10-CM | POA: Diagnosis not present

## 2016-02-12 DIAGNOSIS — A499 Bacterial infection, unspecified: Secondary | ICD-10-CM | POA: Diagnosis not present

## 2016-02-12 DIAGNOSIS — N186 End stage renal disease: Secondary | ICD-10-CM | POA: Diagnosis not present

## 2016-02-14 DIAGNOSIS — R7881 Bacteremia: Secondary | ICD-10-CM | POA: Diagnosis not present

## 2016-02-14 DIAGNOSIS — N2581 Secondary hyperparathyroidism of renal origin: Secondary | ICD-10-CM | POA: Diagnosis not present

## 2016-02-14 DIAGNOSIS — N186 End stage renal disease: Secondary | ICD-10-CM | POA: Diagnosis not present

## 2016-02-14 DIAGNOSIS — A499 Bacterial infection, unspecified: Secondary | ICD-10-CM | POA: Diagnosis not present

## 2016-02-14 DIAGNOSIS — D631 Anemia in chronic kidney disease: Secondary | ICD-10-CM | POA: Diagnosis not present

## 2016-02-15 DIAGNOSIS — N433 Hydrocele, unspecified: Secondary | ICD-10-CM | POA: Diagnosis not present

## 2016-02-16 DIAGNOSIS — N2581 Secondary hyperparathyroidism of renal origin: Secondary | ICD-10-CM | POA: Diagnosis not present

## 2016-02-16 DIAGNOSIS — N186 End stage renal disease: Secondary | ICD-10-CM | POA: Diagnosis not present

## 2016-02-16 DIAGNOSIS — R7881 Bacteremia: Secondary | ICD-10-CM | POA: Diagnosis not present

## 2016-02-16 DIAGNOSIS — D631 Anemia in chronic kidney disease: Secondary | ICD-10-CM | POA: Diagnosis not present

## 2016-02-16 DIAGNOSIS — A499 Bacterial infection, unspecified: Secondary | ICD-10-CM | POA: Diagnosis not present

## 2016-02-19 DIAGNOSIS — A499 Bacterial infection, unspecified: Secondary | ICD-10-CM | POA: Diagnosis not present

## 2016-02-19 DIAGNOSIS — N2581 Secondary hyperparathyroidism of renal origin: Secondary | ICD-10-CM | POA: Diagnosis not present

## 2016-02-19 DIAGNOSIS — N186 End stage renal disease: Secondary | ICD-10-CM | POA: Diagnosis not present

## 2016-02-19 DIAGNOSIS — D631 Anemia in chronic kidney disease: Secondary | ICD-10-CM | POA: Diagnosis not present

## 2016-02-19 DIAGNOSIS — R7881 Bacteremia: Secondary | ICD-10-CM | POA: Diagnosis not present

## 2016-02-21 DIAGNOSIS — N186 End stage renal disease: Secondary | ICD-10-CM | POA: Diagnosis not present

## 2016-02-21 DIAGNOSIS — I2510 Atherosclerotic heart disease of native coronary artery without angina pectoris: Secondary | ICD-10-CM | POA: Diagnosis not present

## 2016-02-21 DIAGNOSIS — Z992 Dependence on renal dialysis: Secondary | ICD-10-CM | POA: Diagnosis not present

## 2016-02-21 DIAGNOSIS — Z8673 Personal history of transient ischemic attack (TIA), and cerebral infarction without residual deficits: Secondary | ICD-10-CM | POA: Diagnosis not present

## 2016-02-21 DIAGNOSIS — F17210 Nicotine dependence, cigarettes, uncomplicated: Secondary | ICD-10-CM | POA: Diagnosis not present

## 2016-02-21 DIAGNOSIS — I120 Hypertensive chronic kidney disease with stage 5 chronic kidney disease or end stage renal disease: Secondary | ICD-10-CM | POA: Diagnosis not present

## 2016-02-21 DIAGNOSIS — R52 Pain, unspecified: Secondary | ICD-10-CM | POA: Diagnosis not present

## 2016-02-21 DIAGNOSIS — I509 Heart failure, unspecified: Secondary | ICD-10-CM | POA: Diagnosis not present

## 2016-02-21 DIAGNOSIS — M7989 Other specified soft tissue disorders: Secondary | ICD-10-CM | POA: Diagnosis not present

## 2016-02-21 DIAGNOSIS — M79602 Pain in left arm: Secondary | ICD-10-CM | POA: Diagnosis not present

## 2016-02-22 DIAGNOSIS — Z720 Tobacco use: Secondary | ICD-10-CM | POA: Diagnosis not present

## 2016-02-22 DIAGNOSIS — M7989 Other specified soft tissue disorders: Secondary | ICD-10-CM | POA: Diagnosis not present

## 2016-02-22 DIAGNOSIS — Z885 Allergy status to narcotic agent status: Secondary | ICD-10-CM | POA: Diagnosis not present

## 2016-02-22 DIAGNOSIS — I722 Aneurysm of renal artery: Secondary | ICD-10-CM | POA: Diagnosis not present

## 2016-02-22 DIAGNOSIS — Z959 Presence of cardiac and vascular implant and graft, unspecified: Secondary | ICD-10-CM | POA: Diagnosis not present

## 2016-02-22 DIAGNOSIS — I998 Other disorder of circulatory system: Secondary | ICD-10-CM | POA: Diagnosis not present

## 2016-02-22 DIAGNOSIS — N186 End stage renal disease: Secondary | ICD-10-CM | POA: Diagnosis not present

## 2016-02-22 DIAGNOSIS — Z992 Dependence on renal dialysis: Secondary | ICD-10-CM | POA: Diagnosis not present

## 2016-02-22 DIAGNOSIS — Z888 Allergy status to other drugs, medicaments and biological substances status: Secondary | ICD-10-CM | POA: Diagnosis not present

## 2016-02-22 DIAGNOSIS — K759 Inflammatory liver disease, unspecified: Secondary | ICD-10-CM | POA: Diagnosis not present

## 2016-02-22 DIAGNOSIS — Z79818 Long term (current) use of other agents affecting estrogen receptors and estrogen levels: Secondary | ICD-10-CM | POA: Diagnosis not present

## 2016-02-22 DIAGNOSIS — Z7982 Long term (current) use of aspirin: Secondary | ICD-10-CM | POA: Diagnosis not present

## 2016-02-22 DIAGNOSIS — I440 Atrioventricular block, first degree: Secondary | ICD-10-CM | POA: Diagnosis not present

## 2016-02-22 DIAGNOSIS — I071 Rheumatic tricuspid insufficiency: Secondary | ICD-10-CM | POA: Diagnosis not present

## 2016-02-22 DIAGNOSIS — I728 Aneurysm of other specified arteries: Secondary | ICD-10-CM | POA: Diagnosis not present

## 2016-02-22 DIAGNOSIS — Z883 Allergy status to other anti-infective agents status: Secondary | ICD-10-CM | POA: Diagnosis not present

## 2016-02-22 DIAGNOSIS — M79602 Pain in left arm: Secondary | ICD-10-CM | POA: Diagnosis not present

## 2016-02-22 DIAGNOSIS — I721 Aneurysm of artery of upper extremity: Secondary | ICD-10-CM | POA: Diagnosis not present

## 2016-02-22 DIAGNOSIS — Z8673 Personal history of transient ischemic attack (TIA), and cerebral infarction without residual deficits: Secondary | ICD-10-CM | POA: Diagnosis not present

## 2016-02-22 DIAGNOSIS — K219 Gastro-esophageal reflux disease without esophagitis: Secondary | ICD-10-CM | POA: Diagnosis not present

## 2016-02-22 DIAGNOSIS — I2510 Atherosclerotic heart disease of native coronary artery without angina pectoris: Secondary | ICD-10-CM | POA: Diagnosis not present

## 2016-02-22 DIAGNOSIS — I749 Embolism and thrombosis of unspecified artery: Secondary | ICD-10-CM | POA: Diagnosis not present

## 2016-02-22 DIAGNOSIS — G5630 Lesion of radial nerve, unspecified upper limb: Secondary | ICD-10-CM | POA: Diagnosis not present

## 2016-02-22 DIAGNOSIS — I120 Hypertensive chronic kidney disease with stage 5 chronic kidney disease or end stage renal disease: Secondary | ICD-10-CM | POA: Diagnosis not present

## 2016-02-22 DIAGNOSIS — Z79899 Other long term (current) drug therapy: Secondary | ICD-10-CM | POA: Diagnosis not present

## 2016-02-22 DIAGNOSIS — F17210 Nicotine dependence, cigarettes, uncomplicated: Secondary | ICD-10-CM | POA: Diagnosis not present

## 2016-02-22 DIAGNOSIS — R599 Enlarged lymph nodes, unspecified: Secondary | ICD-10-CM | POA: Diagnosis not present

## 2016-02-22 DIAGNOSIS — K7460 Unspecified cirrhosis of liver: Secondary | ICD-10-CM | POA: Diagnosis not present

## 2016-02-22 DIAGNOSIS — D631 Anemia in chronic kidney disease: Secondary | ICD-10-CM | POA: Diagnosis not present

## 2016-02-22 DIAGNOSIS — E875 Hyperkalemia: Secondary | ICD-10-CM | POA: Diagnosis not present

## 2016-02-22 DIAGNOSIS — Z8249 Family history of ischemic heart disease and other diseases of the circulatory system: Secondary | ICD-10-CM | POA: Diagnosis not present

## 2016-02-23 DIAGNOSIS — N186 End stage renal disease: Secondary | ICD-10-CM | POA: Diagnosis not present

## 2016-02-23 DIAGNOSIS — I749 Embolism and thrombosis of unspecified artery: Secondary | ICD-10-CM | POA: Diagnosis not present

## 2016-02-23 DIAGNOSIS — Z992 Dependence on renal dialysis: Secondary | ICD-10-CM | POA: Diagnosis not present

## 2016-02-23 DIAGNOSIS — I722 Aneurysm of renal artery: Secondary | ICD-10-CM | POA: Diagnosis not present

## 2016-02-23 DIAGNOSIS — R599 Enlarged lymph nodes, unspecified: Secondary | ICD-10-CM | POA: Diagnosis not present

## 2016-02-26 DIAGNOSIS — Z8249 Family history of ischemic heart disease and other diseases of the circulatory system: Secondary | ICD-10-CM | POA: Diagnosis not present

## 2016-02-26 DIAGNOSIS — Z79818 Long term (current) use of other agents affecting estrogen receptors and estrogen levels: Secondary | ICD-10-CM | POA: Diagnosis not present

## 2016-02-26 DIAGNOSIS — D631 Anemia in chronic kidney disease: Secondary | ICD-10-CM | POA: Diagnosis not present

## 2016-02-26 DIAGNOSIS — I132 Hypertensive heart and chronic kidney disease with heart failure and with stage 5 chronic kidney disease, or end stage renal disease: Secondary | ICD-10-CM | POA: Diagnosis present

## 2016-02-26 DIAGNOSIS — K7460 Unspecified cirrhosis of liver: Secondary | ICD-10-CM | POA: Diagnosis present

## 2016-02-26 DIAGNOSIS — R079 Chest pain, unspecified: Secondary | ICD-10-CM | POA: Diagnosis not present

## 2016-02-26 DIAGNOSIS — I5022 Chronic systolic (congestive) heart failure: Secondary | ICD-10-CM | POA: Diagnosis present

## 2016-02-26 DIAGNOSIS — R001 Bradycardia, unspecified: Secondary | ICD-10-CM | POA: Diagnosis not present

## 2016-02-26 DIAGNOSIS — Z7982 Long term (current) use of aspirin: Secondary | ICD-10-CM | POA: Diagnosis not present

## 2016-02-26 DIAGNOSIS — D649 Anemia, unspecified: Secondary | ICD-10-CM | POA: Diagnosis present

## 2016-02-26 DIAGNOSIS — E162 Hypoglycemia, unspecified: Secondary | ICD-10-CM | POA: Diagnosis not present

## 2016-02-26 DIAGNOSIS — I429 Cardiomyopathy, unspecified: Secondary | ICD-10-CM | POA: Diagnosis present

## 2016-02-26 DIAGNOSIS — Z79899 Other long term (current) drug therapy: Secondary | ICD-10-CM | POA: Diagnosis not present

## 2016-02-26 DIAGNOSIS — I721 Aneurysm of artery of upper extremity: Secondary | ICD-10-CM | POA: Diagnosis not present

## 2016-02-26 DIAGNOSIS — I442 Atrioventricular block, complete: Secondary | ICD-10-CM | POA: Diagnosis present

## 2016-02-26 DIAGNOSIS — D696 Thrombocytopenia, unspecified: Secondary | ICD-10-CM | POA: Diagnosis present

## 2016-02-26 DIAGNOSIS — N186 End stage renal disease: Secondary | ICD-10-CM | POA: Diagnosis not present

## 2016-02-26 DIAGNOSIS — Z8673 Personal history of transient ischemic attack (TIA), and cerebral infarction without residual deficits: Secondary | ICD-10-CM | POA: Diagnosis not present

## 2016-02-26 DIAGNOSIS — F17210 Nicotine dependence, cigarettes, uncomplicated: Secondary | ICD-10-CM | POA: Diagnosis present

## 2016-02-26 DIAGNOSIS — I120 Hypertensive chronic kidney disease with stage 5 chronic kidney disease or end stage renal disease: Secondary | ICD-10-CM | POA: Diagnosis not present

## 2016-02-26 DIAGNOSIS — E875 Hyperkalemia: Secondary | ICD-10-CM | POA: Diagnosis not present

## 2016-02-26 DIAGNOSIS — R0602 Shortness of breath: Secondary | ICD-10-CM | POA: Diagnosis not present

## 2016-02-26 DIAGNOSIS — D472 Monoclonal gammopathy: Secondary | ICD-10-CM | POA: Diagnosis present

## 2016-02-26 DIAGNOSIS — I724 Aneurysm of artery of lower extremity: Secondary | ICD-10-CM | POA: Diagnosis not present

## 2016-02-26 DIAGNOSIS — K219 Gastro-esophageal reflux disease without esophagitis: Secondary | ICD-10-CM | POA: Diagnosis present

## 2016-02-26 DIAGNOSIS — I071 Rheumatic tricuspid insufficiency: Secondary | ICD-10-CM | POA: Diagnosis present

## 2016-02-26 DIAGNOSIS — I499 Cardiac arrhythmia, unspecified: Secondary | ICD-10-CM | POA: Diagnosis not present

## 2016-02-26 DIAGNOSIS — Z95820 Peripheral vascular angioplasty status with implants and grafts: Secondary | ICD-10-CM | POA: Diagnosis not present

## 2016-02-26 DIAGNOSIS — Z992 Dependence on renal dialysis: Secondary | ICD-10-CM | POA: Diagnosis not present

## 2016-02-26 DIAGNOSIS — I10 Essential (primary) hypertension: Secondary | ICD-10-CM | POA: Diagnosis not present

## 2016-03-01 DIAGNOSIS — A499 Bacterial infection, unspecified: Secondary | ICD-10-CM | POA: Diagnosis not present

## 2016-03-01 DIAGNOSIS — N186 End stage renal disease: Secondary | ICD-10-CM | POA: Diagnosis not present

## 2016-03-01 DIAGNOSIS — R7881 Bacteremia: Secondary | ICD-10-CM | POA: Diagnosis not present

## 2016-03-01 DIAGNOSIS — E039 Hypothyroidism, unspecified: Secondary | ICD-10-CM | POA: Diagnosis not present

## 2016-03-01 DIAGNOSIS — N2581 Secondary hyperparathyroidism of renal origin: Secondary | ICD-10-CM | POA: Diagnosis not present

## 2016-03-01 DIAGNOSIS — D631 Anemia in chronic kidney disease: Secondary | ICD-10-CM | POA: Diagnosis not present

## 2016-03-03 DIAGNOSIS — N186 End stage renal disease: Secondary | ICD-10-CM | POA: Diagnosis not present

## 2016-03-03 DIAGNOSIS — Z992 Dependence on renal dialysis: Secondary | ICD-10-CM | POA: Diagnosis not present

## 2016-03-03 DIAGNOSIS — I120 Hypertensive chronic kidney disease with stage 5 chronic kidney disease or end stage renal disease: Secondary | ICD-10-CM | POA: Diagnosis not present

## 2016-03-06 DIAGNOSIS — N186 End stage renal disease: Secondary | ICD-10-CM | POA: Diagnosis not present

## 2016-03-06 DIAGNOSIS — D509 Iron deficiency anemia, unspecified: Secondary | ICD-10-CM | POA: Diagnosis not present

## 2016-03-06 DIAGNOSIS — N2581 Secondary hyperparathyroidism of renal origin: Secondary | ICD-10-CM | POA: Diagnosis not present

## 2016-03-06 DIAGNOSIS — D631 Anemia in chronic kidney disease: Secondary | ICD-10-CM | POA: Diagnosis not present

## 2016-03-08 DIAGNOSIS — N2581 Secondary hyperparathyroidism of renal origin: Secondary | ICD-10-CM | POA: Diagnosis not present

## 2016-03-08 DIAGNOSIS — N186 End stage renal disease: Secondary | ICD-10-CM | POA: Diagnosis not present

## 2016-03-08 DIAGNOSIS — D509 Iron deficiency anemia, unspecified: Secondary | ICD-10-CM | POA: Diagnosis not present

## 2016-03-08 DIAGNOSIS — D631 Anemia in chronic kidney disease: Secondary | ICD-10-CM | POA: Diagnosis not present

## 2016-03-11 DIAGNOSIS — D509 Iron deficiency anemia, unspecified: Secondary | ICD-10-CM | POA: Diagnosis not present

## 2016-03-11 DIAGNOSIS — N186 End stage renal disease: Secondary | ICD-10-CM | POA: Diagnosis not present

## 2016-03-11 DIAGNOSIS — D631 Anemia in chronic kidney disease: Secondary | ICD-10-CM | POA: Diagnosis not present

## 2016-03-11 DIAGNOSIS — N2581 Secondary hyperparathyroidism of renal origin: Secondary | ICD-10-CM | POA: Diagnosis not present

## 2016-03-12 DIAGNOSIS — Z01818 Encounter for other preprocedural examination: Secondary | ICD-10-CM | POA: Diagnosis not present

## 2016-03-12 DIAGNOSIS — N186 End stage renal disease: Secondary | ICD-10-CM | POA: Diagnosis not present

## 2016-03-12 DIAGNOSIS — T82898A Other specified complication of vascular prosthetic devices, implants and grafts, initial encounter: Secondary | ICD-10-CM | POA: Diagnosis not present

## 2016-03-12 DIAGNOSIS — I120 Hypertensive chronic kidney disease with stage 5 chronic kidney disease or end stage renal disease: Secondary | ICD-10-CM | POA: Diagnosis not present

## 2016-03-13 DIAGNOSIS — D509 Iron deficiency anemia, unspecified: Secondary | ICD-10-CM | POA: Diagnosis not present

## 2016-03-13 DIAGNOSIS — D631 Anemia in chronic kidney disease: Secondary | ICD-10-CM | POA: Diagnosis not present

## 2016-03-13 DIAGNOSIS — N186 End stage renal disease: Secondary | ICD-10-CM | POA: Diagnosis not present

## 2016-03-13 DIAGNOSIS — N2581 Secondary hyperparathyroidism of renal origin: Secondary | ICD-10-CM | POA: Diagnosis not present

## 2016-03-15 DIAGNOSIS — N2581 Secondary hyperparathyroidism of renal origin: Secondary | ICD-10-CM | POA: Diagnosis not present

## 2016-03-15 DIAGNOSIS — D631 Anemia in chronic kidney disease: Secondary | ICD-10-CM | POA: Diagnosis not present

## 2016-03-15 DIAGNOSIS — D509 Iron deficiency anemia, unspecified: Secondary | ICD-10-CM | POA: Diagnosis not present

## 2016-03-15 DIAGNOSIS — N186 End stage renal disease: Secondary | ICD-10-CM | POA: Diagnosis not present

## 2016-03-18 DIAGNOSIS — D509 Iron deficiency anemia, unspecified: Secondary | ICD-10-CM | POA: Diagnosis not present

## 2016-03-18 DIAGNOSIS — N186 End stage renal disease: Secondary | ICD-10-CM | POA: Diagnosis not present

## 2016-03-18 DIAGNOSIS — D631 Anemia in chronic kidney disease: Secondary | ICD-10-CM | POA: Diagnosis not present

## 2016-03-18 DIAGNOSIS — N2581 Secondary hyperparathyroidism of renal origin: Secondary | ICD-10-CM | POA: Diagnosis not present

## 2016-03-20 DIAGNOSIS — N186 End stage renal disease: Secondary | ICD-10-CM | POA: Diagnosis not present

## 2016-03-20 DIAGNOSIS — D509 Iron deficiency anemia, unspecified: Secondary | ICD-10-CM | POA: Diagnosis not present

## 2016-03-20 DIAGNOSIS — D631 Anemia in chronic kidney disease: Secondary | ICD-10-CM | POA: Diagnosis not present

## 2016-03-20 DIAGNOSIS — N2581 Secondary hyperparathyroidism of renal origin: Secondary | ICD-10-CM | POA: Diagnosis not present

## 2016-03-20 DIAGNOSIS — E039 Hypothyroidism, unspecified: Secondary | ICD-10-CM | POA: Diagnosis not present

## 2016-03-22 DIAGNOSIS — N2581 Secondary hyperparathyroidism of renal origin: Secondary | ICD-10-CM | POA: Diagnosis not present

## 2016-03-22 DIAGNOSIS — D509 Iron deficiency anemia, unspecified: Secondary | ICD-10-CM | POA: Diagnosis not present

## 2016-03-22 DIAGNOSIS — N186 End stage renal disease: Secondary | ICD-10-CM | POA: Diagnosis not present

## 2016-03-22 DIAGNOSIS — D631 Anemia in chronic kidney disease: Secondary | ICD-10-CM | POA: Diagnosis not present

## 2016-03-25 DIAGNOSIS — N186 End stage renal disease: Secondary | ICD-10-CM | POA: Diagnosis not present

## 2016-03-25 DIAGNOSIS — N2581 Secondary hyperparathyroidism of renal origin: Secondary | ICD-10-CM | POA: Diagnosis not present

## 2016-03-25 DIAGNOSIS — D631 Anemia in chronic kidney disease: Secondary | ICD-10-CM | POA: Diagnosis not present

## 2016-03-25 DIAGNOSIS — D509 Iron deficiency anemia, unspecified: Secondary | ICD-10-CM | POA: Diagnosis not present

## 2016-03-27 DIAGNOSIS — D631 Anemia in chronic kidney disease: Secondary | ICD-10-CM | POA: Diagnosis not present

## 2016-03-27 DIAGNOSIS — N2581 Secondary hyperparathyroidism of renal origin: Secondary | ICD-10-CM | POA: Diagnosis not present

## 2016-03-27 DIAGNOSIS — D509 Iron deficiency anemia, unspecified: Secondary | ICD-10-CM | POA: Diagnosis not present

## 2016-03-27 DIAGNOSIS — N186 End stage renal disease: Secondary | ICD-10-CM | POA: Diagnosis not present

## 2016-03-28 DIAGNOSIS — N186 End stage renal disease: Secondary | ICD-10-CM | POA: Diagnosis not present

## 2016-03-28 DIAGNOSIS — N2581 Secondary hyperparathyroidism of renal origin: Secondary | ICD-10-CM | POA: Diagnosis not present

## 2016-03-28 DIAGNOSIS — D509 Iron deficiency anemia, unspecified: Secondary | ICD-10-CM | POA: Diagnosis not present

## 2016-03-28 DIAGNOSIS — D631 Anemia in chronic kidney disease: Secondary | ICD-10-CM | POA: Diagnosis not present

## 2016-04-01 DIAGNOSIS — D509 Iron deficiency anemia, unspecified: Secondary | ICD-10-CM | POA: Diagnosis not present

## 2016-04-01 DIAGNOSIS — D631 Anemia in chronic kidney disease: Secondary | ICD-10-CM | POA: Diagnosis not present

## 2016-04-01 DIAGNOSIS — N2581 Secondary hyperparathyroidism of renal origin: Secondary | ICD-10-CM | POA: Diagnosis not present

## 2016-04-01 DIAGNOSIS — N186 End stage renal disease: Secondary | ICD-10-CM | POA: Diagnosis not present

## 2016-04-03 DIAGNOSIS — D631 Anemia in chronic kidney disease: Secondary | ICD-10-CM | POA: Diagnosis not present

## 2016-04-03 DIAGNOSIS — I120 Hypertensive chronic kidney disease with stage 5 chronic kidney disease or end stage renal disease: Secondary | ICD-10-CM | POA: Diagnosis not present

## 2016-04-03 DIAGNOSIS — D509 Iron deficiency anemia, unspecified: Secondary | ICD-10-CM | POA: Diagnosis not present

## 2016-04-03 DIAGNOSIS — N186 End stage renal disease: Secondary | ICD-10-CM | POA: Diagnosis not present

## 2016-04-03 DIAGNOSIS — N2581 Secondary hyperparathyroidism of renal origin: Secondary | ICD-10-CM | POA: Diagnosis not present

## 2016-04-03 DIAGNOSIS — Z992 Dependence on renal dialysis: Secondary | ICD-10-CM | POA: Diagnosis not present

## 2016-04-06 DIAGNOSIS — D631 Anemia in chronic kidney disease: Secondary | ICD-10-CM | POA: Diagnosis not present

## 2016-04-06 DIAGNOSIS — N186 End stage renal disease: Secondary | ICD-10-CM | POA: Diagnosis not present

## 2016-04-06 DIAGNOSIS — N2581 Secondary hyperparathyroidism of renal origin: Secondary | ICD-10-CM | POA: Diagnosis not present

## 2016-04-06 DIAGNOSIS — D509 Iron deficiency anemia, unspecified: Secondary | ICD-10-CM | POA: Diagnosis not present

## 2016-04-08 DIAGNOSIS — N186 End stage renal disease: Secondary | ICD-10-CM | POA: Diagnosis not present

## 2016-04-08 DIAGNOSIS — D509 Iron deficiency anemia, unspecified: Secondary | ICD-10-CM | POA: Diagnosis not present

## 2016-04-08 DIAGNOSIS — N2581 Secondary hyperparathyroidism of renal origin: Secondary | ICD-10-CM | POA: Diagnosis not present

## 2016-04-08 DIAGNOSIS — D631 Anemia in chronic kidney disease: Secondary | ICD-10-CM | POA: Diagnosis not present

## 2016-04-10 DIAGNOSIS — N2581 Secondary hyperparathyroidism of renal origin: Secondary | ICD-10-CM | POA: Diagnosis not present

## 2016-04-10 DIAGNOSIS — D509 Iron deficiency anemia, unspecified: Secondary | ICD-10-CM | POA: Diagnosis not present

## 2016-04-10 DIAGNOSIS — N186 End stage renal disease: Secondary | ICD-10-CM | POA: Diagnosis not present

## 2016-04-10 DIAGNOSIS — D631 Anemia in chronic kidney disease: Secondary | ICD-10-CM | POA: Diagnosis not present

## 2016-04-12 DIAGNOSIS — N186 End stage renal disease: Secondary | ICD-10-CM | POA: Diagnosis not present

## 2016-04-12 DIAGNOSIS — D631 Anemia in chronic kidney disease: Secondary | ICD-10-CM | POA: Diagnosis not present

## 2016-04-12 DIAGNOSIS — N2581 Secondary hyperparathyroidism of renal origin: Secondary | ICD-10-CM | POA: Diagnosis not present

## 2016-04-12 DIAGNOSIS — D509 Iron deficiency anemia, unspecified: Secondary | ICD-10-CM | POA: Diagnosis not present

## 2016-04-15 DIAGNOSIS — N186 End stage renal disease: Secondary | ICD-10-CM | POA: Diagnosis not present

## 2016-04-16 DIAGNOSIS — Z4901 Encounter for fitting and adjustment of extracorporeal dialysis catheter: Secondary | ICD-10-CM | POA: Diagnosis not present

## 2016-04-16 DIAGNOSIS — T82868A Thrombosis of vascular prosthetic devices, implants and grafts, initial encounter: Secondary | ICD-10-CM | POA: Diagnosis not present

## 2016-04-17 DIAGNOSIS — N186 End stage renal disease: Secondary | ICD-10-CM | POA: Diagnosis not present

## 2016-04-17 DIAGNOSIS — D631 Anemia in chronic kidney disease: Secondary | ICD-10-CM | POA: Diagnosis not present

## 2016-04-17 DIAGNOSIS — D509 Iron deficiency anemia, unspecified: Secondary | ICD-10-CM | POA: Diagnosis not present

## 2016-04-17 DIAGNOSIS — N2581 Secondary hyperparathyroidism of renal origin: Secondary | ICD-10-CM | POA: Diagnosis not present

## 2016-04-19 DIAGNOSIS — D509 Iron deficiency anemia, unspecified: Secondary | ICD-10-CM | POA: Diagnosis not present

## 2016-04-19 DIAGNOSIS — N186 End stage renal disease: Secondary | ICD-10-CM | POA: Diagnosis not present

## 2016-04-19 DIAGNOSIS — N2581 Secondary hyperparathyroidism of renal origin: Secondary | ICD-10-CM | POA: Diagnosis not present

## 2016-04-19 DIAGNOSIS — D631 Anemia in chronic kidney disease: Secondary | ICD-10-CM | POA: Diagnosis not present

## 2016-04-22 DIAGNOSIS — D509 Iron deficiency anemia, unspecified: Secondary | ICD-10-CM | POA: Diagnosis not present

## 2016-04-22 DIAGNOSIS — D631 Anemia in chronic kidney disease: Secondary | ICD-10-CM | POA: Diagnosis not present

## 2016-04-22 DIAGNOSIS — N186 End stage renal disease: Secondary | ICD-10-CM | POA: Diagnosis not present

## 2016-04-22 DIAGNOSIS — N2581 Secondary hyperparathyroidism of renal origin: Secondary | ICD-10-CM | POA: Diagnosis not present

## 2016-04-24 DIAGNOSIS — D631 Anemia in chronic kidney disease: Secondary | ICD-10-CM | POA: Diagnosis not present

## 2016-04-24 DIAGNOSIS — D509 Iron deficiency anemia, unspecified: Secondary | ICD-10-CM | POA: Diagnosis not present

## 2016-04-24 DIAGNOSIS — N2581 Secondary hyperparathyroidism of renal origin: Secondary | ICD-10-CM | POA: Diagnosis not present

## 2016-04-24 DIAGNOSIS — N186 End stage renal disease: Secondary | ICD-10-CM | POA: Diagnosis not present

## 2016-04-24 DIAGNOSIS — E039 Hypothyroidism, unspecified: Secondary | ICD-10-CM | POA: Diagnosis not present

## 2016-04-26 DIAGNOSIS — N2581 Secondary hyperparathyroidism of renal origin: Secondary | ICD-10-CM | POA: Diagnosis not present

## 2016-04-26 DIAGNOSIS — D631 Anemia in chronic kidney disease: Secondary | ICD-10-CM | POA: Diagnosis not present

## 2016-04-26 DIAGNOSIS — N186 End stage renal disease: Secondary | ICD-10-CM | POA: Diagnosis not present

## 2016-04-26 DIAGNOSIS — D509 Iron deficiency anemia, unspecified: Secondary | ICD-10-CM | POA: Diagnosis not present

## 2016-04-30 DIAGNOSIS — N186 End stage renal disease: Secondary | ICD-10-CM | POA: Diagnosis not present

## 2016-04-30 DIAGNOSIS — Z992 Dependence on renal dialysis: Secondary | ICD-10-CM | POA: Diagnosis not present

## 2016-04-30 DIAGNOSIS — I150 Renovascular hypertension: Secondary | ICD-10-CM | POA: Diagnosis not present

## 2016-04-30 DIAGNOSIS — Z01818 Encounter for other preprocedural examination: Secondary | ICD-10-CM | POA: Diagnosis not present

## 2016-04-30 DIAGNOSIS — D472 Monoclonal gammopathy: Secondary | ICD-10-CM | POA: Diagnosis not present

## 2016-05-01 DIAGNOSIS — D509 Iron deficiency anemia, unspecified: Secondary | ICD-10-CM | POA: Diagnosis not present

## 2016-05-01 DIAGNOSIS — N186 End stage renal disease: Secondary | ICD-10-CM | POA: Diagnosis not present

## 2016-05-01 DIAGNOSIS — D631 Anemia in chronic kidney disease: Secondary | ICD-10-CM | POA: Diagnosis not present

## 2016-05-01 DIAGNOSIS — N2581 Secondary hyperparathyroidism of renal origin: Secondary | ICD-10-CM | POA: Diagnosis not present

## 2016-05-03 DIAGNOSIS — D631 Anemia in chronic kidney disease: Secondary | ICD-10-CM | POA: Diagnosis not present

## 2016-05-03 DIAGNOSIS — I120 Hypertensive chronic kidney disease with stage 5 chronic kidney disease or end stage renal disease: Secondary | ICD-10-CM | POA: Diagnosis not present

## 2016-05-03 DIAGNOSIS — N186 End stage renal disease: Secondary | ICD-10-CM | POA: Diagnosis not present

## 2016-05-03 DIAGNOSIS — D689 Coagulation defect, unspecified: Secondary | ICD-10-CM | POA: Diagnosis not present

## 2016-05-03 DIAGNOSIS — N2581 Secondary hyperparathyroidism of renal origin: Secondary | ICD-10-CM | POA: Diagnosis not present

## 2016-05-03 DIAGNOSIS — Z992 Dependence on renal dialysis: Secondary | ICD-10-CM | POA: Diagnosis not present

## 2016-05-03 DIAGNOSIS — D509 Iron deficiency anemia, unspecified: Secondary | ICD-10-CM | POA: Diagnosis not present

## 2016-05-06 DIAGNOSIS — N2581 Secondary hyperparathyroidism of renal origin: Secondary | ICD-10-CM | POA: Diagnosis not present

## 2016-05-06 DIAGNOSIS — D631 Anemia in chronic kidney disease: Secondary | ICD-10-CM | POA: Diagnosis not present

## 2016-05-06 DIAGNOSIS — D509 Iron deficiency anemia, unspecified: Secondary | ICD-10-CM | POA: Diagnosis not present

## 2016-05-06 DIAGNOSIS — N186 End stage renal disease: Secondary | ICD-10-CM | POA: Diagnosis not present

## 2016-05-08 DIAGNOSIS — N2581 Secondary hyperparathyroidism of renal origin: Secondary | ICD-10-CM | POA: Diagnosis not present

## 2016-05-08 DIAGNOSIS — N186 End stage renal disease: Secondary | ICD-10-CM | POA: Diagnosis not present

## 2016-05-08 DIAGNOSIS — D631 Anemia in chronic kidney disease: Secondary | ICD-10-CM | POA: Diagnosis not present

## 2016-05-08 DIAGNOSIS — D509 Iron deficiency anemia, unspecified: Secondary | ICD-10-CM | POA: Diagnosis not present

## 2016-05-09 DIAGNOSIS — D631 Anemia in chronic kidney disease: Secondary | ICD-10-CM | POA: Diagnosis not present

## 2016-05-09 DIAGNOSIS — D509 Iron deficiency anemia, unspecified: Secondary | ICD-10-CM | POA: Diagnosis not present

## 2016-05-09 DIAGNOSIS — N186 End stage renal disease: Secondary | ICD-10-CM | POA: Diagnosis not present

## 2016-05-09 DIAGNOSIS — N2581 Secondary hyperparathyroidism of renal origin: Secondary | ICD-10-CM | POA: Diagnosis not present

## 2016-05-10 DIAGNOSIS — F17210 Nicotine dependence, cigarettes, uncomplicated: Secondary | ICD-10-CM | POA: Diagnosis not present

## 2016-05-10 DIAGNOSIS — Z7982 Long term (current) use of aspirin: Secondary | ICD-10-CM | POA: Diagnosis not present

## 2016-05-10 DIAGNOSIS — I5020 Unspecified systolic (congestive) heart failure: Secondary | ICD-10-CM | POA: Diagnosis not present

## 2016-05-10 DIAGNOSIS — I132 Hypertensive heart and chronic kidney disease with heart failure and with stage 5 chronic kidney disease, or end stage renal disease: Secondary | ICD-10-CM | POA: Diagnosis not present

## 2016-05-10 DIAGNOSIS — I879 Disorder of vein, unspecified: Secondary | ICD-10-CM | POA: Diagnosis not present

## 2016-05-10 DIAGNOSIS — Z888 Allergy status to other drugs, medicaments and biological substances status: Secondary | ICD-10-CM | POA: Diagnosis not present

## 2016-05-10 DIAGNOSIS — T82898A Other specified complication of vascular prosthetic devices, implants and grafts, initial encounter: Secondary | ICD-10-CM | POA: Diagnosis not present

## 2016-05-10 DIAGNOSIS — N186 End stage renal disease: Secondary | ICD-10-CM | POA: Diagnosis not present

## 2016-05-10 DIAGNOSIS — Z8673 Personal history of transient ischemic attack (TIA), and cerebral infarction without residual deficits: Secondary | ICD-10-CM | POA: Diagnosis not present

## 2016-05-10 DIAGNOSIS — K7460 Unspecified cirrhosis of liver: Secondary | ICD-10-CM | POA: Diagnosis not present

## 2016-05-10 DIAGNOSIS — Z885 Allergy status to narcotic agent status: Secondary | ICD-10-CM | POA: Diagnosis not present

## 2016-05-10 DIAGNOSIS — I071 Rheumatic tricuspid insufficiency: Secondary | ICD-10-CM | POA: Diagnosis not present

## 2016-05-10 DIAGNOSIS — R911 Solitary pulmonary nodule: Secondary | ICD-10-CM | POA: Diagnosis not present

## 2016-05-10 DIAGNOSIS — Z79899 Other long term (current) drug therapy: Secondary | ICD-10-CM | POA: Diagnosis not present

## 2016-05-10 DIAGNOSIS — Z992 Dependence on renal dialysis: Secondary | ICD-10-CM | POA: Diagnosis not present

## 2016-05-10 DIAGNOSIS — K219 Gastro-esophageal reflux disease without esophagitis: Secondary | ICD-10-CM | POA: Diagnosis not present

## 2016-05-11 DIAGNOSIS — K219 Gastro-esophageal reflux disease without esophagitis: Secondary | ICD-10-CM | POA: Diagnosis not present

## 2016-05-11 DIAGNOSIS — Z885 Allergy status to narcotic agent status: Secondary | ICD-10-CM | POA: Diagnosis not present

## 2016-05-11 DIAGNOSIS — I071 Rheumatic tricuspid insufficiency: Secondary | ICD-10-CM | POA: Diagnosis not present

## 2016-05-11 DIAGNOSIS — I879 Disorder of vein, unspecified: Secondary | ICD-10-CM | POA: Diagnosis not present

## 2016-05-11 DIAGNOSIS — T82898A Other specified complication of vascular prosthetic devices, implants and grafts, initial encounter: Secondary | ICD-10-CM | POA: Diagnosis not present

## 2016-05-11 DIAGNOSIS — Z992 Dependence on renal dialysis: Secondary | ICD-10-CM | POA: Diagnosis not present

## 2016-05-13 DIAGNOSIS — D509 Iron deficiency anemia, unspecified: Secondary | ICD-10-CM | POA: Diagnosis not present

## 2016-05-13 DIAGNOSIS — N186 End stage renal disease: Secondary | ICD-10-CM | POA: Diagnosis not present

## 2016-05-13 DIAGNOSIS — N2581 Secondary hyperparathyroidism of renal origin: Secondary | ICD-10-CM | POA: Diagnosis not present

## 2016-05-13 DIAGNOSIS — D631 Anemia in chronic kidney disease: Secondary | ICD-10-CM | POA: Diagnosis not present

## 2016-05-17 DIAGNOSIS — D509 Iron deficiency anemia, unspecified: Secondary | ICD-10-CM | POA: Diagnosis not present

## 2016-05-17 DIAGNOSIS — N2581 Secondary hyperparathyroidism of renal origin: Secondary | ICD-10-CM | POA: Diagnosis not present

## 2016-05-17 DIAGNOSIS — N186 End stage renal disease: Secondary | ICD-10-CM | POA: Diagnosis not present

## 2016-05-17 DIAGNOSIS — D631 Anemia in chronic kidney disease: Secondary | ICD-10-CM | POA: Diagnosis not present

## 2016-05-20 DIAGNOSIS — D509 Iron deficiency anemia, unspecified: Secondary | ICD-10-CM | POA: Diagnosis not present

## 2016-05-20 DIAGNOSIS — D631 Anemia in chronic kidney disease: Secondary | ICD-10-CM | POA: Diagnosis not present

## 2016-05-20 DIAGNOSIS — N186 End stage renal disease: Secondary | ICD-10-CM | POA: Diagnosis not present

## 2016-05-20 DIAGNOSIS — N2581 Secondary hyperparathyroidism of renal origin: Secondary | ICD-10-CM | POA: Diagnosis not present

## 2016-05-22 DIAGNOSIS — N186 End stage renal disease: Secondary | ICD-10-CM | POA: Diagnosis not present

## 2016-05-22 DIAGNOSIS — D509 Iron deficiency anemia, unspecified: Secondary | ICD-10-CM | POA: Diagnosis not present

## 2016-05-22 DIAGNOSIS — D631 Anemia in chronic kidney disease: Secondary | ICD-10-CM | POA: Diagnosis not present

## 2016-05-22 DIAGNOSIS — E039 Hypothyroidism, unspecified: Secondary | ICD-10-CM | POA: Diagnosis not present

## 2016-05-22 DIAGNOSIS — N2581 Secondary hyperparathyroidism of renal origin: Secondary | ICD-10-CM | POA: Diagnosis not present

## 2016-05-24 DIAGNOSIS — N2581 Secondary hyperparathyroidism of renal origin: Secondary | ICD-10-CM | POA: Diagnosis not present

## 2016-05-24 DIAGNOSIS — D631 Anemia in chronic kidney disease: Secondary | ICD-10-CM | POA: Diagnosis not present

## 2016-05-24 DIAGNOSIS — N186 End stage renal disease: Secondary | ICD-10-CM | POA: Diagnosis not present

## 2016-05-24 DIAGNOSIS — D509 Iron deficiency anemia, unspecified: Secondary | ICD-10-CM | POA: Diagnosis not present

## 2016-05-27 DIAGNOSIS — D631 Anemia in chronic kidney disease: Secondary | ICD-10-CM | POA: Diagnosis not present

## 2016-05-27 DIAGNOSIS — N186 End stage renal disease: Secondary | ICD-10-CM | POA: Diagnosis not present

## 2016-05-27 DIAGNOSIS — N2581 Secondary hyperparathyroidism of renal origin: Secondary | ICD-10-CM | POA: Diagnosis not present

## 2016-05-27 DIAGNOSIS — D509 Iron deficiency anemia, unspecified: Secondary | ICD-10-CM | POA: Diagnosis not present

## 2016-05-29 DIAGNOSIS — D631 Anemia in chronic kidney disease: Secondary | ICD-10-CM | POA: Diagnosis not present

## 2016-05-29 DIAGNOSIS — N2581 Secondary hyperparathyroidism of renal origin: Secondary | ICD-10-CM | POA: Diagnosis not present

## 2016-05-29 DIAGNOSIS — D509 Iron deficiency anemia, unspecified: Secondary | ICD-10-CM | POA: Diagnosis not present

## 2016-05-29 DIAGNOSIS — N186 End stage renal disease: Secondary | ICD-10-CM | POA: Diagnosis not present

## 2016-05-31 DIAGNOSIS — D631 Anemia in chronic kidney disease: Secondary | ICD-10-CM | POA: Diagnosis not present

## 2016-05-31 DIAGNOSIS — N2581 Secondary hyperparathyroidism of renal origin: Secondary | ICD-10-CM | POA: Diagnosis not present

## 2016-05-31 DIAGNOSIS — N186 End stage renal disease: Secondary | ICD-10-CM | POA: Diagnosis not present

## 2016-05-31 DIAGNOSIS — D509 Iron deficiency anemia, unspecified: Secondary | ICD-10-CM | POA: Diagnosis not present

## 2016-06-03 DIAGNOSIS — D631 Anemia in chronic kidney disease: Secondary | ICD-10-CM | POA: Diagnosis not present

## 2016-06-03 DIAGNOSIS — Z992 Dependence on renal dialysis: Secondary | ICD-10-CM | POA: Diagnosis not present

## 2016-06-03 DIAGNOSIS — N186 End stage renal disease: Secondary | ICD-10-CM | POA: Diagnosis not present

## 2016-06-03 DIAGNOSIS — N2581 Secondary hyperparathyroidism of renal origin: Secondary | ICD-10-CM | POA: Diagnosis not present

## 2016-06-03 DIAGNOSIS — D509 Iron deficiency anemia, unspecified: Secondary | ICD-10-CM | POA: Diagnosis not present

## 2016-06-03 DIAGNOSIS — I120 Hypertensive chronic kidney disease with stage 5 chronic kidney disease or end stage renal disease: Secondary | ICD-10-CM | POA: Diagnosis not present

## 2016-06-05 DIAGNOSIS — N186 End stage renal disease: Secondary | ICD-10-CM | POA: Diagnosis not present

## 2016-06-05 DIAGNOSIS — Z09 Encounter for follow-up examination after completed treatment for conditions other than malignant neoplasm: Secondary | ICD-10-CM | POA: Diagnosis not present

## 2016-06-05 DIAGNOSIS — Z992 Dependence on renal dialysis: Secondary | ICD-10-CM | POA: Diagnosis not present

## 2016-06-07 DIAGNOSIS — N2581 Secondary hyperparathyroidism of renal origin: Secondary | ICD-10-CM | POA: Diagnosis not present

## 2016-06-07 DIAGNOSIS — D631 Anemia in chronic kidney disease: Secondary | ICD-10-CM | POA: Diagnosis not present

## 2016-06-07 DIAGNOSIS — N186 End stage renal disease: Secondary | ICD-10-CM | POA: Diagnosis not present

## 2016-06-10 DIAGNOSIS — N2581 Secondary hyperparathyroidism of renal origin: Secondary | ICD-10-CM | POA: Diagnosis not present

## 2016-06-10 DIAGNOSIS — D631 Anemia in chronic kidney disease: Secondary | ICD-10-CM | POA: Diagnosis not present

## 2016-06-10 DIAGNOSIS — N186 End stage renal disease: Secondary | ICD-10-CM | POA: Diagnosis not present

## 2016-06-12 DIAGNOSIS — N186 End stage renal disease: Secondary | ICD-10-CM | POA: Diagnosis not present

## 2016-06-12 DIAGNOSIS — N2581 Secondary hyperparathyroidism of renal origin: Secondary | ICD-10-CM | POA: Diagnosis not present

## 2016-06-12 DIAGNOSIS — D631 Anemia in chronic kidney disease: Secondary | ICD-10-CM | POA: Diagnosis not present

## 2016-06-13 DIAGNOSIS — N2581 Secondary hyperparathyroidism of renal origin: Secondary | ICD-10-CM | POA: Diagnosis not present

## 2016-06-13 DIAGNOSIS — D631 Anemia in chronic kidney disease: Secondary | ICD-10-CM | POA: Diagnosis not present

## 2016-06-13 DIAGNOSIS — N186 End stage renal disease: Secondary | ICD-10-CM | POA: Diagnosis not present

## 2016-06-17 DIAGNOSIS — D631 Anemia in chronic kidney disease: Secondary | ICD-10-CM | POA: Diagnosis not present

## 2016-06-17 DIAGNOSIS — N186 End stage renal disease: Secondary | ICD-10-CM | POA: Diagnosis not present

## 2016-06-17 DIAGNOSIS — N2581 Secondary hyperparathyroidism of renal origin: Secondary | ICD-10-CM | POA: Diagnosis not present

## 2016-06-19 DIAGNOSIS — N2581 Secondary hyperparathyroidism of renal origin: Secondary | ICD-10-CM | POA: Diagnosis not present

## 2016-06-19 DIAGNOSIS — N186 End stage renal disease: Secondary | ICD-10-CM | POA: Diagnosis not present

## 2016-06-19 DIAGNOSIS — D631 Anemia in chronic kidney disease: Secondary | ICD-10-CM | POA: Diagnosis not present

## 2016-06-19 DIAGNOSIS — E039 Hypothyroidism, unspecified: Secondary | ICD-10-CM | POA: Diagnosis not present

## 2016-06-21 DIAGNOSIS — N2581 Secondary hyperparathyroidism of renal origin: Secondary | ICD-10-CM | POA: Diagnosis not present

## 2016-06-21 DIAGNOSIS — N186 End stage renal disease: Secondary | ICD-10-CM | POA: Diagnosis not present

## 2016-06-21 DIAGNOSIS — D631 Anemia in chronic kidney disease: Secondary | ICD-10-CM | POA: Diagnosis not present

## 2016-06-24 DIAGNOSIS — N2581 Secondary hyperparathyroidism of renal origin: Secondary | ICD-10-CM | POA: Diagnosis not present

## 2016-06-24 DIAGNOSIS — N186 End stage renal disease: Secondary | ICD-10-CM | POA: Diagnosis not present

## 2016-06-24 DIAGNOSIS — D631 Anemia in chronic kidney disease: Secondary | ICD-10-CM | POA: Diagnosis not present

## 2016-06-25 DIAGNOSIS — Z111 Encounter for screening for respiratory tuberculosis: Secondary | ICD-10-CM | POA: Diagnosis not present

## 2016-06-25 DIAGNOSIS — R7611 Nonspecific reaction to tuberculin skin test without active tuberculosis: Secondary | ICD-10-CM | POA: Diagnosis not present

## 2016-06-26 DIAGNOSIS — D631 Anemia in chronic kidney disease: Secondary | ICD-10-CM | POA: Diagnosis not present

## 2016-06-26 DIAGNOSIS — N186 End stage renal disease: Secondary | ICD-10-CM | POA: Diagnosis not present

## 2016-06-26 DIAGNOSIS — N2581 Secondary hyperparathyroidism of renal origin: Secondary | ICD-10-CM | POA: Diagnosis not present

## 2016-06-28 DIAGNOSIS — D631 Anemia in chronic kidney disease: Secondary | ICD-10-CM | POA: Diagnosis not present

## 2016-06-28 DIAGNOSIS — N2581 Secondary hyperparathyroidism of renal origin: Secondary | ICD-10-CM | POA: Diagnosis not present

## 2016-06-28 DIAGNOSIS — N186 End stage renal disease: Secondary | ICD-10-CM | POA: Diagnosis not present

## 2016-07-02 DIAGNOSIS — D631 Anemia in chronic kidney disease: Secondary | ICD-10-CM | POA: Diagnosis not present

## 2016-07-02 DIAGNOSIS — Z992 Dependence on renal dialysis: Secondary | ICD-10-CM | POA: Diagnosis not present

## 2016-07-02 DIAGNOSIS — N186 End stage renal disease: Secondary | ICD-10-CM | POA: Diagnosis not present

## 2016-07-02 DIAGNOSIS — Z1159 Encounter for screening for other viral diseases: Secondary | ICD-10-CM | POA: Diagnosis not present

## 2016-07-02 DIAGNOSIS — D529 Folate deficiency anemia, unspecified: Secondary | ICD-10-CM | POA: Diagnosis not present

## 2016-07-02 DIAGNOSIS — N2581 Secondary hyperparathyroidism of renal origin: Secondary | ICD-10-CM | POA: Diagnosis not present

## 2016-07-04 DIAGNOSIS — I120 Hypertensive chronic kidney disease with stage 5 chronic kidney disease or end stage renal disease: Secondary | ICD-10-CM | POA: Diagnosis not present

## 2016-07-04 DIAGNOSIS — Z992 Dependence on renal dialysis: Secondary | ICD-10-CM | POA: Diagnosis not present

## 2016-07-04 DIAGNOSIS — N186 End stage renal disease: Secondary | ICD-10-CM | POA: Diagnosis not present

## 2016-07-05 DIAGNOSIS — Z992 Dependence on renal dialysis: Secondary | ICD-10-CM | POA: Diagnosis not present

## 2016-07-05 DIAGNOSIS — N186 End stage renal disease: Secondary | ICD-10-CM | POA: Diagnosis not present

## 2016-07-06 DIAGNOSIS — N186 End stage renal disease: Secondary | ICD-10-CM | POA: Diagnosis not present

## 2016-07-06 DIAGNOSIS — Z992 Dependence on renal dialysis: Secondary | ICD-10-CM | POA: Diagnosis not present

## 2016-07-06 DIAGNOSIS — N2581 Secondary hyperparathyroidism of renal origin: Secondary | ICD-10-CM | POA: Diagnosis not present

## 2016-07-09 DIAGNOSIS — Z992 Dependence on renal dialysis: Secondary | ICD-10-CM | POA: Diagnosis not present

## 2016-07-09 DIAGNOSIS — N186 End stage renal disease: Secondary | ICD-10-CM | POA: Diagnosis not present

## 2016-07-09 DIAGNOSIS — N2581 Secondary hyperparathyroidism of renal origin: Secondary | ICD-10-CM | POA: Diagnosis not present

## 2016-07-12 DIAGNOSIS — E878 Other disorders of electrolyte and fluid balance, not elsewhere classified: Secondary | ICD-10-CM | POA: Diagnosis not present

## 2016-07-12 DIAGNOSIS — D631 Anemia in chronic kidney disease: Secondary | ICD-10-CM | POA: Diagnosis not present

## 2016-07-12 DIAGNOSIS — N2581 Secondary hyperparathyroidism of renal origin: Secondary | ICD-10-CM | POA: Diagnosis not present

## 2016-07-12 DIAGNOSIS — Z992 Dependence on renal dialysis: Secondary | ICD-10-CM | POA: Diagnosis not present

## 2016-07-12 DIAGNOSIS — N186 End stage renal disease: Secondary | ICD-10-CM | POA: Diagnosis not present

## 2016-07-12 DIAGNOSIS — D509 Iron deficiency anemia, unspecified: Secondary | ICD-10-CM | POA: Diagnosis not present

## 2016-07-15 DIAGNOSIS — D509 Iron deficiency anemia, unspecified: Secondary | ICD-10-CM | POA: Diagnosis not present

## 2016-07-15 DIAGNOSIS — Z992 Dependence on renal dialysis: Secondary | ICD-10-CM | POA: Diagnosis not present

## 2016-07-15 DIAGNOSIS — D631 Anemia in chronic kidney disease: Secondary | ICD-10-CM | POA: Diagnosis not present

## 2016-07-15 DIAGNOSIS — N186 End stage renal disease: Secondary | ICD-10-CM | POA: Diagnosis not present

## 2016-07-15 DIAGNOSIS — N2581 Secondary hyperparathyroidism of renal origin: Secondary | ICD-10-CM | POA: Diagnosis not present

## 2016-07-15 DIAGNOSIS — E878 Other disorders of electrolyte and fluid balance, not elsewhere classified: Secondary | ICD-10-CM | POA: Diagnosis not present

## 2016-07-17 DIAGNOSIS — D509 Iron deficiency anemia, unspecified: Secondary | ICD-10-CM | POA: Diagnosis not present

## 2016-07-17 DIAGNOSIS — Z992 Dependence on renal dialysis: Secondary | ICD-10-CM | POA: Diagnosis not present

## 2016-07-17 DIAGNOSIS — N186 End stage renal disease: Secondary | ICD-10-CM | POA: Diagnosis not present

## 2016-07-17 DIAGNOSIS — E878 Other disorders of electrolyte and fluid balance, not elsewhere classified: Secondary | ICD-10-CM | POA: Diagnosis not present

## 2016-07-17 DIAGNOSIS — N2581 Secondary hyperparathyroidism of renal origin: Secondary | ICD-10-CM | POA: Diagnosis not present

## 2016-07-17 DIAGNOSIS — D631 Anemia in chronic kidney disease: Secondary | ICD-10-CM | POA: Diagnosis not present

## 2016-07-18 DIAGNOSIS — Z992 Dependence on renal dialysis: Secondary | ICD-10-CM | POA: Diagnosis not present

## 2016-07-18 DIAGNOSIS — I10 Essential (primary) hypertension: Secondary | ICD-10-CM | POA: Diagnosis not present

## 2016-07-18 DIAGNOSIS — N186 End stage renal disease: Secondary | ICD-10-CM | POA: Diagnosis not present

## 2016-07-18 DIAGNOSIS — I721 Aneurysm of artery of upper extremity: Secondary | ICD-10-CM | POA: Diagnosis not present

## 2016-07-18 DIAGNOSIS — E2740 Unspecified adrenocortical insufficiency: Secondary | ICD-10-CM | POA: Diagnosis not present

## 2016-07-18 DIAGNOSIS — I5022 Chronic systolic (congestive) heart failure: Secondary | ICD-10-CM | POA: Diagnosis not present

## 2016-07-19 DIAGNOSIS — Z992 Dependence on renal dialysis: Secondary | ICD-10-CM | POA: Diagnosis not present

## 2016-07-19 DIAGNOSIS — E878 Other disorders of electrolyte and fluid balance, not elsewhere classified: Secondary | ICD-10-CM | POA: Diagnosis not present

## 2016-07-19 DIAGNOSIS — N186 End stage renal disease: Secondary | ICD-10-CM | POA: Diagnosis not present

## 2016-07-19 DIAGNOSIS — D509 Iron deficiency anemia, unspecified: Secondary | ICD-10-CM | POA: Diagnosis not present

## 2016-07-19 DIAGNOSIS — N2581 Secondary hyperparathyroidism of renal origin: Secondary | ICD-10-CM | POA: Diagnosis not present

## 2016-07-19 DIAGNOSIS — D631 Anemia in chronic kidney disease: Secondary | ICD-10-CM | POA: Diagnosis not present

## 2016-07-22 DIAGNOSIS — D631 Anemia in chronic kidney disease: Secondary | ICD-10-CM | POA: Diagnosis not present

## 2016-07-22 DIAGNOSIS — D509 Iron deficiency anemia, unspecified: Secondary | ICD-10-CM | POA: Diagnosis not present

## 2016-07-22 DIAGNOSIS — Z992 Dependence on renal dialysis: Secondary | ICD-10-CM | POA: Diagnosis not present

## 2016-07-22 DIAGNOSIS — N186 End stage renal disease: Secondary | ICD-10-CM | POA: Diagnosis not present

## 2016-07-22 DIAGNOSIS — N2581 Secondary hyperparathyroidism of renal origin: Secondary | ICD-10-CM | POA: Diagnosis not present

## 2016-07-22 DIAGNOSIS — E878 Other disorders of electrolyte and fluid balance, not elsewhere classified: Secondary | ICD-10-CM | POA: Diagnosis not present

## 2016-07-24 DIAGNOSIS — N186 End stage renal disease: Secondary | ICD-10-CM | POA: Diagnosis not present

## 2016-07-24 DIAGNOSIS — N2581 Secondary hyperparathyroidism of renal origin: Secondary | ICD-10-CM | POA: Diagnosis not present

## 2016-07-24 DIAGNOSIS — D509 Iron deficiency anemia, unspecified: Secondary | ICD-10-CM | POA: Diagnosis not present

## 2016-07-24 DIAGNOSIS — E878 Other disorders of electrolyte and fluid balance, not elsewhere classified: Secondary | ICD-10-CM | POA: Diagnosis not present

## 2016-07-24 DIAGNOSIS — D631 Anemia in chronic kidney disease: Secondary | ICD-10-CM | POA: Diagnosis not present

## 2016-07-24 DIAGNOSIS — Z992 Dependence on renal dialysis: Secondary | ICD-10-CM | POA: Diagnosis not present

## 2016-07-29 DIAGNOSIS — I440 Atrioventricular block, first degree: Secondary | ICD-10-CM | POA: Diagnosis not present

## 2016-07-29 DIAGNOSIS — Z8673 Personal history of transient ischemic attack (TIA), and cerebral infarction without residual deficits: Secondary | ICD-10-CM | POA: Diagnosis not present

## 2016-07-29 DIAGNOSIS — Z888 Allergy status to other drugs, medicaments and biological substances status: Secondary | ICD-10-CM | POA: Diagnosis not present

## 2016-07-29 DIAGNOSIS — Z886 Allergy status to analgesic agent status: Secondary | ICD-10-CM | POA: Diagnosis not present

## 2016-07-29 DIAGNOSIS — R001 Bradycardia, unspecified: Secondary | ICD-10-CM | POA: Diagnosis not present

## 2016-07-29 DIAGNOSIS — I120 Hypertensive chronic kidney disease with stage 5 chronic kidney disease or end stage renal disease: Secondary | ICD-10-CM | POA: Diagnosis not present

## 2016-07-29 DIAGNOSIS — N19 Unspecified kidney failure: Secondary | ICD-10-CM | POA: Diagnosis not present

## 2016-07-29 DIAGNOSIS — F17210 Nicotine dependence, cigarettes, uncomplicated: Secondary | ICD-10-CM | POA: Diagnosis present

## 2016-07-29 DIAGNOSIS — Z9115 Patient's noncompliance with renal dialysis: Secondary | ICD-10-CM | POA: Diagnosis not present

## 2016-07-29 DIAGNOSIS — Z992 Dependence on renal dialysis: Secondary | ICD-10-CM | POA: Diagnosis not present

## 2016-07-29 DIAGNOSIS — J811 Chronic pulmonary edema: Secondary | ICD-10-CM | POA: Diagnosis not present

## 2016-07-29 DIAGNOSIS — R531 Weakness: Secondary | ICD-10-CM | POA: Diagnosis not present

## 2016-07-29 DIAGNOSIS — J984 Other disorders of lung: Secondary | ICD-10-CM | POA: Diagnosis not present

## 2016-07-29 DIAGNOSIS — I10 Essential (primary) hypertension: Secondary | ICD-10-CM | POA: Diagnosis not present

## 2016-07-29 DIAGNOSIS — D638 Anemia in other chronic diseases classified elsewhere: Secondary | ICD-10-CM | POA: Diagnosis present

## 2016-07-29 DIAGNOSIS — Z885 Allergy status to narcotic agent status: Secondary | ICD-10-CM | POA: Diagnosis not present

## 2016-07-29 DIAGNOSIS — I517 Cardiomegaly: Secondary | ICD-10-CM | POA: Diagnosis not present

## 2016-07-29 DIAGNOSIS — I132 Hypertensive heart and chronic kidney disease with heart failure and with stage 5 chronic kidney disease, or end stage renal disease: Secondary | ICD-10-CM | POA: Diagnosis present

## 2016-07-29 DIAGNOSIS — I5022 Chronic systolic (congestive) heart failure: Secondary | ICD-10-CM | POA: Diagnosis present

## 2016-07-29 DIAGNOSIS — E875 Hyperkalemia: Secondary | ICD-10-CM | POA: Diagnosis not present

## 2016-07-29 DIAGNOSIS — N186 End stage renal disease: Secondary | ICD-10-CM | POA: Diagnosis not present

## 2016-07-29 DIAGNOSIS — Z743 Need for continuous supervision: Secondary | ICD-10-CM | POA: Diagnosis not present

## 2016-07-31 DIAGNOSIS — D509 Iron deficiency anemia, unspecified: Secondary | ICD-10-CM | POA: Diagnosis not present

## 2016-07-31 DIAGNOSIS — N186 End stage renal disease: Secondary | ICD-10-CM | POA: Diagnosis not present

## 2016-07-31 DIAGNOSIS — D631 Anemia in chronic kidney disease: Secondary | ICD-10-CM | POA: Diagnosis not present

## 2016-07-31 DIAGNOSIS — N2581 Secondary hyperparathyroidism of renal origin: Secondary | ICD-10-CM | POA: Diagnosis not present

## 2016-07-31 DIAGNOSIS — E878 Other disorders of electrolyte and fluid balance, not elsewhere classified: Secondary | ICD-10-CM | POA: Diagnosis not present

## 2016-07-31 DIAGNOSIS — Z992 Dependence on renal dialysis: Secondary | ICD-10-CM | POA: Diagnosis not present

## 2016-08-02 DIAGNOSIS — N186 End stage renal disease: Secondary | ICD-10-CM | POA: Diagnosis not present

## 2016-08-02 DIAGNOSIS — Z992 Dependence on renal dialysis: Secondary | ICD-10-CM | POA: Diagnosis not present

## 2016-08-02 DIAGNOSIS — E878 Other disorders of electrolyte and fluid balance, not elsewhere classified: Secondary | ICD-10-CM | POA: Diagnosis not present

## 2016-08-02 DIAGNOSIS — D509 Iron deficiency anemia, unspecified: Secondary | ICD-10-CM | POA: Diagnosis not present

## 2016-08-02 DIAGNOSIS — N2581 Secondary hyperparathyroidism of renal origin: Secondary | ICD-10-CM | POA: Diagnosis not present

## 2016-08-02 DIAGNOSIS — D631 Anemia in chronic kidney disease: Secondary | ICD-10-CM | POA: Diagnosis not present

## 2016-08-04 DIAGNOSIS — Z992 Dependence on renal dialysis: Secondary | ICD-10-CM | POA: Diagnosis not present

## 2016-08-04 DIAGNOSIS — N186 End stage renal disease: Secondary | ICD-10-CM | POA: Diagnosis not present

## 2016-08-06 DIAGNOSIS — Z886 Allergy status to analgesic agent status: Secondary | ICD-10-CM | POA: Diagnosis not present

## 2016-08-06 DIAGNOSIS — N186 End stage renal disease: Secondary | ICD-10-CM | POA: Diagnosis not present

## 2016-08-06 DIAGNOSIS — I120 Hypertensive chronic kidney disease with stage 5 chronic kidney disease or end stage renal disease: Secondary | ICD-10-CM | POA: Diagnosis not present

## 2016-08-06 DIAGNOSIS — I132 Hypertensive heart and chronic kidney disease with heart failure and with stage 5 chronic kidney disease, or end stage renal disease: Secondary | ICD-10-CM | POA: Diagnosis present

## 2016-08-06 DIAGNOSIS — Z8673 Personal history of transient ischemic attack (TIA), and cerebral infarction without residual deficits: Secondary | ICD-10-CM | POA: Diagnosis not present

## 2016-08-06 DIAGNOSIS — Z7952 Long term (current) use of systemic steroids: Secondary | ICD-10-CM | POA: Diagnosis not present

## 2016-08-06 DIAGNOSIS — Z992 Dependence on renal dialysis: Secondary | ICD-10-CM | POA: Diagnosis not present

## 2016-08-06 DIAGNOSIS — F17210 Nicotine dependence, cigarettes, uncomplicated: Secondary | ICD-10-CM | POA: Diagnosis present

## 2016-08-06 DIAGNOSIS — Z885 Allergy status to narcotic agent status: Secondary | ICD-10-CM | POA: Diagnosis not present

## 2016-08-06 DIAGNOSIS — I5022 Chronic systolic (congestive) heart failure: Secondary | ICD-10-CM | POA: Diagnosis present

## 2016-08-06 DIAGNOSIS — E892 Postprocedural hypoparathyroidism: Secondary | ICD-10-CM | POA: Diagnosis present

## 2016-08-06 DIAGNOSIS — Z765 Malingerer [conscious simulation]: Secondary | ICD-10-CM | POA: Diagnosis not present

## 2016-08-06 DIAGNOSIS — R200 Anesthesia of skin: Secondary | ICD-10-CM | POA: Diagnosis present

## 2016-08-06 DIAGNOSIS — D649 Anemia, unspecified: Secondary | ICD-10-CM | POA: Diagnosis not present

## 2016-08-06 DIAGNOSIS — I440 Atrioventricular block, first degree: Secondary | ICD-10-CM | POA: Diagnosis not present

## 2016-08-06 DIAGNOSIS — Z9115 Patient's noncompliance with renal dialysis: Secondary | ICD-10-CM | POA: Diagnosis not present

## 2016-08-06 DIAGNOSIS — E875 Hyperkalemia: Secondary | ICD-10-CM | POA: Diagnosis not present

## 2016-08-06 DIAGNOSIS — M79602 Pain in left arm: Secondary | ICD-10-CM | POA: Diagnosis not present

## 2016-08-06 DIAGNOSIS — N250 Renal osteodystrophy: Secondary | ICD-10-CM | POA: Diagnosis not present

## 2016-08-06 DIAGNOSIS — Z79891 Long term (current) use of opiate analgesic: Secondary | ICD-10-CM | POA: Diagnosis not present

## 2016-08-06 DIAGNOSIS — D638 Anemia in other chronic diseases classified elsewhere: Secondary | ICD-10-CM | POA: Diagnosis present

## 2016-08-06 DIAGNOSIS — M25532 Pain in left wrist: Secondary | ICD-10-CM | POA: Diagnosis not present

## 2016-08-09 DIAGNOSIS — D509 Iron deficiency anemia, unspecified: Secondary | ICD-10-CM | POA: Diagnosis not present

## 2016-08-09 DIAGNOSIS — E878 Other disorders of electrolyte and fluid balance, not elsewhere classified: Secondary | ICD-10-CM | POA: Diagnosis not present

## 2016-08-09 DIAGNOSIS — Z992 Dependence on renal dialysis: Secondary | ICD-10-CM | POA: Diagnosis not present

## 2016-08-09 DIAGNOSIS — D631 Anemia in chronic kidney disease: Secondary | ICD-10-CM | POA: Diagnosis not present

## 2016-08-09 DIAGNOSIS — N2581 Secondary hyperparathyroidism of renal origin: Secondary | ICD-10-CM | POA: Diagnosis not present

## 2016-08-09 DIAGNOSIS — N186 End stage renal disease: Secondary | ICD-10-CM | POA: Diagnosis not present

## 2016-08-12 DIAGNOSIS — N186 End stage renal disease: Secondary | ICD-10-CM | POA: Diagnosis not present

## 2016-08-12 DIAGNOSIS — D509 Iron deficiency anemia, unspecified: Secondary | ICD-10-CM | POA: Diagnosis not present

## 2016-08-12 DIAGNOSIS — D631 Anemia in chronic kidney disease: Secondary | ICD-10-CM | POA: Diagnosis not present

## 2016-08-12 DIAGNOSIS — E878 Other disorders of electrolyte and fluid balance, not elsewhere classified: Secondary | ICD-10-CM | POA: Diagnosis not present

## 2016-08-12 DIAGNOSIS — Z992 Dependence on renal dialysis: Secondary | ICD-10-CM | POA: Diagnosis not present

## 2016-08-12 DIAGNOSIS — N2581 Secondary hyperparathyroidism of renal origin: Secondary | ICD-10-CM | POA: Diagnosis not present

## 2016-08-14 DIAGNOSIS — N2581 Secondary hyperparathyroidism of renal origin: Secondary | ICD-10-CM | POA: Diagnosis not present

## 2016-08-14 DIAGNOSIS — Z992 Dependence on renal dialysis: Secondary | ICD-10-CM | POA: Diagnosis not present

## 2016-08-14 DIAGNOSIS — E878 Other disorders of electrolyte and fluid balance, not elsewhere classified: Secondary | ICD-10-CM | POA: Diagnosis not present

## 2016-08-14 DIAGNOSIS — N186 End stage renal disease: Secondary | ICD-10-CM | POA: Diagnosis not present

## 2016-08-14 DIAGNOSIS — D509 Iron deficiency anemia, unspecified: Secondary | ICD-10-CM | POA: Diagnosis not present

## 2016-08-14 DIAGNOSIS — D631 Anemia in chronic kidney disease: Secondary | ICD-10-CM | POA: Diagnosis not present

## 2016-08-16 DIAGNOSIS — Z992 Dependence on renal dialysis: Secondary | ICD-10-CM | POA: Diagnosis not present

## 2016-08-16 DIAGNOSIS — E878 Other disorders of electrolyte and fluid balance, not elsewhere classified: Secondary | ICD-10-CM | POA: Diagnosis not present

## 2016-08-16 DIAGNOSIS — N186 End stage renal disease: Secondary | ICD-10-CM | POA: Diagnosis not present

## 2016-08-16 DIAGNOSIS — N2581 Secondary hyperparathyroidism of renal origin: Secondary | ICD-10-CM | POA: Diagnosis not present

## 2016-08-16 DIAGNOSIS — D509 Iron deficiency anemia, unspecified: Secondary | ICD-10-CM | POA: Diagnosis not present

## 2016-08-16 DIAGNOSIS — D631 Anemia in chronic kidney disease: Secondary | ICD-10-CM | POA: Diagnosis not present

## 2016-08-19 DIAGNOSIS — N2581 Secondary hyperparathyroidism of renal origin: Secondary | ICD-10-CM | POA: Diagnosis not present

## 2016-08-19 DIAGNOSIS — D509 Iron deficiency anemia, unspecified: Secondary | ICD-10-CM | POA: Diagnosis not present

## 2016-08-19 DIAGNOSIS — N186 End stage renal disease: Secondary | ICD-10-CM | POA: Diagnosis not present

## 2016-08-19 DIAGNOSIS — D631 Anemia in chronic kidney disease: Secondary | ICD-10-CM | POA: Diagnosis not present

## 2016-08-19 DIAGNOSIS — E878 Other disorders of electrolyte and fluid balance, not elsewhere classified: Secondary | ICD-10-CM | POA: Diagnosis not present

## 2016-08-19 DIAGNOSIS — Z992 Dependence on renal dialysis: Secondary | ICD-10-CM | POA: Diagnosis not present

## 2016-08-21 DIAGNOSIS — N2581 Secondary hyperparathyroidism of renal origin: Secondary | ICD-10-CM | POA: Diagnosis not present

## 2016-08-21 DIAGNOSIS — E878 Other disorders of electrolyte and fluid balance, not elsewhere classified: Secondary | ICD-10-CM | POA: Diagnosis not present

## 2016-08-21 DIAGNOSIS — D631 Anemia in chronic kidney disease: Secondary | ICD-10-CM | POA: Diagnosis not present

## 2016-08-21 DIAGNOSIS — Z992 Dependence on renal dialysis: Secondary | ICD-10-CM | POA: Diagnosis not present

## 2016-08-21 DIAGNOSIS — D509 Iron deficiency anemia, unspecified: Secondary | ICD-10-CM | POA: Diagnosis not present

## 2016-08-21 DIAGNOSIS — N186 End stage renal disease: Secondary | ICD-10-CM | POA: Diagnosis not present

## 2016-08-23 DIAGNOSIS — E878 Other disorders of electrolyte and fluid balance, not elsewhere classified: Secondary | ICD-10-CM | POA: Diagnosis not present

## 2016-08-23 DIAGNOSIS — D631 Anemia in chronic kidney disease: Secondary | ICD-10-CM | POA: Diagnosis not present

## 2016-08-23 DIAGNOSIS — N2581 Secondary hyperparathyroidism of renal origin: Secondary | ICD-10-CM | POA: Diagnosis not present

## 2016-08-23 DIAGNOSIS — N186 End stage renal disease: Secondary | ICD-10-CM | POA: Diagnosis not present

## 2016-08-23 DIAGNOSIS — Z992 Dependence on renal dialysis: Secondary | ICD-10-CM | POA: Diagnosis not present

## 2016-08-23 DIAGNOSIS — D509 Iron deficiency anemia, unspecified: Secondary | ICD-10-CM | POA: Diagnosis not present

## 2016-08-26 DIAGNOSIS — E878 Other disorders of electrolyte and fluid balance, not elsewhere classified: Secondary | ICD-10-CM | POA: Diagnosis not present

## 2016-08-26 DIAGNOSIS — N2581 Secondary hyperparathyroidism of renal origin: Secondary | ICD-10-CM | POA: Diagnosis not present

## 2016-08-26 DIAGNOSIS — N186 End stage renal disease: Secondary | ICD-10-CM | POA: Diagnosis not present

## 2016-08-26 DIAGNOSIS — Z992 Dependence on renal dialysis: Secondary | ICD-10-CM | POA: Diagnosis not present

## 2016-08-26 DIAGNOSIS — D509 Iron deficiency anemia, unspecified: Secondary | ICD-10-CM | POA: Diagnosis not present

## 2016-08-26 DIAGNOSIS — D631 Anemia in chronic kidney disease: Secondary | ICD-10-CM | POA: Diagnosis not present

## 2016-08-28 DIAGNOSIS — D509 Iron deficiency anemia, unspecified: Secondary | ICD-10-CM | POA: Diagnosis not present

## 2016-08-28 DIAGNOSIS — D631 Anemia in chronic kidney disease: Secondary | ICD-10-CM | POA: Diagnosis not present

## 2016-08-28 DIAGNOSIS — Z992 Dependence on renal dialysis: Secondary | ICD-10-CM | POA: Diagnosis not present

## 2016-08-28 DIAGNOSIS — E878 Other disorders of electrolyte and fluid balance, not elsewhere classified: Secondary | ICD-10-CM | POA: Diagnosis not present

## 2016-08-28 DIAGNOSIS — N186 End stage renal disease: Secondary | ICD-10-CM | POA: Diagnosis not present

## 2016-08-28 DIAGNOSIS — N2581 Secondary hyperparathyroidism of renal origin: Secondary | ICD-10-CM | POA: Diagnosis not present

## 2016-08-30 DIAGNOSIS — D631 Anemia in chronic kidney disease: Secondary | ICD-10-CM | POA: Diagnosis not present

## 2016-08-30 DIAGNOSIS — N2581 Secondary hyperparathyroidism of renal origin: Secondary | ICD-10-CM | POA: Diagnosis not present

## 2016-08-30 DIAGNOSIS — E878 Other disorders of electrolyte and fluid balance, not elsewhere classified: Secondary | ICD-10-CM | POA: Diagnosis not present

## 2016-08-30 DIAGNOSIS — Z992 Dependence on renal dialysis: Secondary | ICD-10-CM | POA: Diagnosis not present

## 2016-08-30 DIAGNOSIS — N186 End stage renal disease: Secondary | ICD-10-CM | POA: Diagnosis not present

## 2016-08-30 DIAGNOSIS — D509 Iron deficiency anemia, unspecified: Secondary | ICD-10-CM | POA: Diagnosis not present

## 2016-09-02 DIAGNOSIS — D631 Anemia in chronic kidney disease: Secondary | ICD-10-CM | POA: Diagnosis not present

## 2016-09-02 DIAGNOSIS — N186 End stage renal disease: Secondary | ICD-10-CM | POA: Diagnosis not present

## 2016-09-02 DIAGNOSIS — E878 Other disorders of electrolyte and fluid balance, not elsewhere classified: Secondary | ICD-10-CM | POA: Diagnosis not present

## 2016-09-02 DIAGNOSIS — Z992 Dependence on renal dialysis: Secondary | ICD-10-CM | POA: Diagnosis not present

## 2016-09-02 DIAGNOSIS — N2581 Secondary hyperparathyroidism of renal origin: Secondary | ICD-10-CM | POA: Diagnosis not present

## 2016-09-02 DIAGNOSIS — D509 Iron deficiency anemia, unspecified: Secondary | ICD-10-CM | POA: Diagnosis not present

## 2016-09-04 DIAGNOSIS — Z992 Dependence on renal dialysis: Secondary | ICD-10-CM | POA: Diagnosis not present

## 2016-09-04 DIAGNOSIS — D509 Iron deficiency anemia, unspecified: Secondary | ICD-10-CM | POA: Diagnosis not present

## 2016-09-04 DIAGNOSIS — D631 Anemia in chronic kidney disease: Secondary | ICD-10-CM | POA: Diagnosis not present

## 2016-09-04 DIAGNOSIS — E878 Other disorders of electrolyte and fluid balance, not elsewhere classified: Secondary | ICD-10-CM | POA: Diagnosis not present

## 2016-09-04 DIAGNOSIS — N2581 Secondary hyperparathyroidism of renal origin: Secondary | ICD-10-CM | POA: Diagnosis not present

## 2016-09-04 DIAGNOSIS — N186 End stage renal disease: Secondary | ICD-10-CM | POA: Diagnosis not present

## 2016-09-06 DIAGNOSIS — Z992 Dependence on renal dialysis: Secondary | ICD-10-CM | POA: Diagnosis not present

## 2016-09-06 DIAGNOSIS — D509 Iron deficiency anemia, unspecified: Secondary | ICD-10-CM | POA: Diagnosis not present

## 2016-09-06 DIAGNOSIS — D631 Anemia in chronic kidney disease: Secondary | ICD-10-CM | POA: Diagnosis not present

## 2016-09-06 DIAGNOSIS — N2581 Secondary hyperparathyroidism of renal origin: Secondary | ICD-10-CM | POA: Diagnosis not present

## 2016-09-06 DIAGNOSIS — N186 End stage renal disease: Secondary | ICD-10-CM | POA: Diagnosis not present

## 2016-09-06 DIAGNOSIS — E878 Other disorders of electrolyte and fluid balance, not elsewhere classified: Secondary | ICD-10-CM | POA: Diagnosis not present

## 2016-09-09 DIAGNOSIS — N2581 Secondary hyperparathyroidism of renal origin: Secondary | ICD-10-CM | POA: Diagnosis not present

## 2016-09-09 DIAGNOSIS — D509 Iron deficiency anemia, unspecified: Secondary | ICD-10-CM | POA: Diagnosis not present

## 2016-09-09 DIAGNOSIS — Z992 Dependence on renal dialysis: Secondary | ICD-10-CM | POA: Diagnosis not present

## 2016-09-09 DIAGNOSIS — N186 End stage renal disease: Secondary | ICD-10-CM | POA: Diagnosis not present

## 2016-09-09 DIAGNOSIS — E878 Other disorders of electrolyte and fluid balance, not elsewhere classified: Secondary | ICD-10-CM | POA: Diagnosis not present

## 2016-09-09 DIAGNOSIS — D631 Anemia in chronic kidney disease: Secondary | ICD-10-CM | POA: Diagnosis not present

## 2016-09-13 DIAGNOSIS — Z992 Dependence on renal dialysis: Secondary | ICD-10-CM | POA: Diagnosis not present

## 2016-09-13 DIAGNOSIS — N186 End stage renal disease: Secondary | ICD-10-CM | POA: Diagnosis not present

## 2016-09-13 DIAGNOSIS — E878 Other disorders of electrolyte and fluid balance, not elsewhere classified: Secondary | ICD-10-CM | POA: Diagnosis not present

## 2016-09-13 DIAGNOSIS — N2581 Secondary hyperparathyroidism of renal origin: Secondary | ICD-10-CM | POA: Diagnosis not present

## 2016-09-13 DIAGNOSIS — D631 Anemia in chronic kidney disease: Secondary | ICD-10-CM | POA: Diagnosis not present

## 2016-09-13 DIAGNOSIS — D509 Iron deficiency anemia, unspecified: Secondary | ICD-10-CM | POA: Diagnosis not present

## 2016-09-16 DIAGNOSIS — N2581 Secondary hyperparathyroidism of renal origin: Secondary | ICD-10-CM | POA: Diagnosis not present

## 2016-09-16 DIAGNOSIS — D631 Anemia in chronic kidney disease: Secondary | ICD-10-CM | POA: Diagnosis not present

## 2016-09-16 DIAGNOSIS — Z992 Dependence on renal dialysis: Secondary | ICD-10-CM | POA: Diagnosis not present

## 2016-09-16 DIAGNOSIS — N186 End stage renal disease: Secondary | ICD-10-CM | POA: Diagnosis not present

## 2016-09-16 DIAGNOSIS — D509 Iron deficiency anemia, unspecified: Secondary | ICD-10-CM | POA: Diagnosis not present

## 2016-09-16 DIAGNOSIS — E878 Other disorders of electrolyte and fluid balance, not elsewhere classified: Secondary | ICD-10-CM | POA: Diagnosis not present

## 2016-09-17 DIAGNOSIS — T82898A Other specified complication of vascular prosthetic devices, implants and grafts, initial encounter: Secondary | ICD-10-CM | POA: Diagnosis not present

## 2016-09-18 DIAGNOSIS — N186 End stage renal disease: Secondary | ICD-10-CM | POA: Diagnosis not present

## 2016-09-18 DIAGNOSIS — E878 Other disorders of electrolyte and fluid balance, not elsewhere classified: Secondary | ICD-10-CM | POA: Diagnosis not present

## 2016-09-18 DIAGNOSIS — Z992 Dependence on renal dialysis: Secondary | ICD-10-CM | POA: Diagnosis not present

## 2016-09-18 DIAGNOSIS — N2581 Secondary hyperparathyroidism of renal origin: Secondary | ICD-10-CM | POA: Diagnosis not present

## 2016-09-18 DIAGNOSIS — D631 Anemia in chronic kidney disease: Secondary | ICD-10-CM | POA: Diagnosis not present

## 2016-09-18 DIAGNOSIS — D509 Iron deficiency anemia, unspecified: Secondary | ICD-10-CM | POA: Diagnosis not present

## 2016-09-20 DIAGNOSIS — Z992 Dependence on renal dialysis: Secondary | ICD-10-CM | POA: Diagnosis not present

## 2016-09-20 DIAGNOSIS — D509 Iron deficiency anemia, unspecified: Secondary | ICD-10-CM | POA: Diagnosis not present

## 2016-09-20 DIAGNOSIS — N186 End stage renal disease: Secondary | ICD-10-CM | POA: Diagnosis not present

## 2016-09-20 DIAGNOSIS — N2581 Secondary hyperparathyroidism of renal origin: Secondary | ICD-10-CM | POA: Diagnosis not present

## 2016-09-20 DIAGNOSIS — D631 Anemia in chronic kidney disease: Secondary | ICD-10-CM | POA: Diagnosis not present

## 2016-09-20 DIAGNOSIS — E878 Other disorders of electrolyte and fluid balance, not elsewhere classified: Secondary | ICD-10-CM | POA: Diagnosis not present

## 2016-09-23 DIAGNOSIS — E878 Other disorders of electrolyte and fluid balance, not elsewhere classified: Secondary | ICD-10-CM | POA: Diagnosis not present

## 2016-09-23 DIAGNOSIS — Z992 Dependence on renal dialysis: Secondary | ICD-10-CM | POA: Diagnosis not present

## 2016-09-23 DIAGNOSIS — N186 End stage renal disease: Secondary | ICD-10-CM | POA: Diagnosis not present

## 2016-09-23 DIAGNOSIS — D509 Iron deficiency anemia, unspecified: Secondary | ICD-10-CM | POA: Diagnosis not present

## 2016-09-23 DIAGNOSIS — D631 Anemia in chronic kidney disease: Secondary | ICD-10-CM | POA: Diagnosis not present

## 2016-09-23 DIAGNOSIS — N2581 Secondary hyperparathyroidism of renal origin: Secondary | ICD-10-CM | POA: Diagnosis not present

## 2016-09-25 DIAGNOSIS — E878 Other disorders of electrolyte and fluid balance, not elsewhere classified: Secondary | ICD-10-CM | POA: Diagnosis not present

## 2016-09-25 DIAGNOSIS — D509 Iron deficiency anemia, unspecified: Secondary | ICD-10-CM | POA: Diagnosis not present

## 2016-09-25 DIAGNOSIS — N2581 Secondary hyperparathyroidism of renal origin: Secondary | ICD-10-CM | POA: Diagnosis not present

## 2016-09-25 DIAGNOSIS — D631 Anemia in chronic kidney disease: Secondary | ICD-10-CM | POA: Diagnosis not present

## 2016-09-25 DIAGNOSIS — Z992 Dependence on renal dialysis: Secondary | ICD-10-CM | POA: Diagnosis not present

## 2016-09-25 DIAGNOSIS — N186 End stage renal disease: Secondary | ICD-10-CM | POA: Diagnosis not present

## 2016-09-27 DIAGNOSIS — D631 Anemia in chronic kidney disease: Secondary | ICD-10-CM | POA: Diagnosis not present

## 2016-09-27 DIAGNOSIS — E878 Other disorders of electrolyte and fluid balance, not elsewhere classified: Secondary | ICD-10-CM | POA: Diagnosis not present

## 2016-09-27 DIAGNOSIS — N2581 Secondary hyperparathyroidism of renal origin: Secondary | ICD-10-CM | POA: Diagnosis not present

## 2016-09-27 DIAGNOSIS — D509 Iron deficiency anemia, unspecified: Secondary | ICD-10-CM | POA: Diagnosis not present

## 2016-09-27 DIAGNOSIS — N186 End stage renal disease: Secondary | ICD-10-CM | POA: Diagnosis not present

## 2016-09-27 DIAGNOSIS — Z992 Dependence on renal dialysis: Secondary | ICD-10-CM | POA: Diagnosis not present

## 2016-09-30 DIAGNOSIS — N186 End stage renal disease: Secondary | ICD-10-CM | POA: Diagnosis not present

## 2016-09-30 DIAGNOSIS — N2581 Secondary hyperparathyroidism of renal origin: Secondary | ICD-10-CM | POA: Diagnosis not present

## 2016-09-30 DIAGNOSIS — D631 Anemia in chronic kidney disease: Secondary | ICD-10-CM | POA: Diagnosis not present

## 2016-09-30 DIAGNOSIS — D509 Iron deficiency anemia, unspecified: Secondary | ICD-10-CM | POA: Diagnosis not present

## 2016-09-30 DIAGNOSIS — E878 Other disorders of electrolyte and fluid balance, not elsewhere classified: Secondary | ICD-10-CM | POA: Diagnosis not present

## 2016-09-30 DIAGNOSIS — Z992 Dependence on renal dialysis: Secondary | ICD-10-CM | POA: Diagnosis not present

## 2016-10-03 DIAGNOSIS — E875 Hyperkalemia: Secondary | ICD-10-CM | POA: Diagnosis not present

## 2016-10-03 DIAGNOSIS — T82858A Stenosis of vascular prosthetic devices, implants and grafts, initial encounter: Secondary | ICD-10-CM | POA: Diagnosis not present

## 2016-10-03 DIAGNOSIS — I742 Embolism and thrombosis of arteries of the upper extremities: Secondary | ICD-10-CM | POA: Diagnosis not present

## 2016-10-03 DIAGNOSIS — I132 Hypertensive heart and chronic kidney disease with heart failure and with stage 5 chronic kidney disease, or end stage renal disease: Secondary | ICD-10-CM | POA: Diagnosis not present

## 2016-10-03 DIAGNOSIS — N186 End stage renal disease: Secondary | ICD-10-CM | POA: Diagnosis not present

## 2016-10-03 DIAGNOSIS — N189 Chronic kidney disease, unspecified: Secondary | ICD-10-CM | POA: Diagnosis not present

## 2016-10-03 DIAGNOSIS — T82898A Other specified complication of vascular prosthetic devices, implants and grafts, initial encounter: Secondary | ICD-10-CM | POA: Diagnosis not present

## 2016-10-03 DIAGNOSIS — R001 Bradycardia, unspecified: Secondary | ICD-10-CM | POA: Diagnosis not present

## 2016-10-03 DIAGNOSIS — I5022 Chronic systolic (congestive) heart failure: Secondary | ICD-10-CM | POA: Diagnosis not present

## 2016-10-03 DIAGNOSIS — E2740 Unspecified adrenocortical insufficiency: Secondary | ICD-10-CM | POA: Diagnosis not present

## 2016-10-03 DIAGNOSIS — I120 Hypertensive chronic kidney disease with stage 5 chronic kidney disease or end stage renal disease: Secondary | ICD-10-CM | POA: Diagnosis not present

## 2016-10-04 DIAGNOSIS — I120 Hypertensive chronic kidney disease with stage 5 chronic kidney disease or end stage renal disease: Secondary | ICD-10-CM | POA: Diagnosis not present

## 2016-10-04 DIAGNOSIS — I9589 Other hypotension: Secondary | ICD-10-CM | POA: Diagnosis not present

## 2016-10-04 DIAGNOSIS — Z886 Allergy status to analgesic agent status: Secondary | ICD-10-CM | POA: Diagnosis not present

## 2016-10-04 DIAGNOSIS — Z992 Dependence on renal dialysis: Secondary | ICD-10-CM | POA: Diagnosis not present

## 2016-10-04 DIAGNOSIS — I440 Atrioventricular block, first degree: Secondary | ICD-10-CM | POA: Diagnosis not present

## 2016-10-04 DIAGNOSIS — E2740 Unspecified adrenocortical insufficiency: Secondary | ICD-10-CM | POA: Diagnosis present

## 2016-10-04 DIAGNOSIS — I959 Hypotension, unspecified: Secondary | ICD-10-CM | POA: Diagnosis not present

## 2016-10-04 DIAGNOSIS — N189 Chronic kidney disease, unspecified: Secondary | ICD-10-CM | POA: Diagnosis not present

## 2016-10-04 DIAGNOSIS — I132 Hypertensive heart and chronic kidney disease with heart failure and with stage 5 chronic kidney disease, or end stage renal disease: Secondary | ICD-10-CM | POA: Diagnosis present

## 2016-10-04 DIAGNOSIS — D638 Anemia in other chronic diseases classified elsewhere: Secondary | ICD-10-CM | POA: Diagnosis present

## 2016-10-04 DIAGNOSIS — I5022 Chronic systolic (congestive) heart failure: Secondary | ICD-10-CM | POA: Diagnosis present

## 2016-10-04 DIAGNOSIS — F17210 Nicotine dependence, cigarettes, uncomplicated: Secondary | ICD-10-CM | POA: Diagnosis present

## 2016-10-04 DIAGNOSIS — E872 Acidosis: Secondary | ICD-10-CM | POA: Diagnosis present

## 2016-10-04 DIAGNOSIS — N186 End stage renal disease: Secondary | ICD-10-CM | POA: Diagnosis not present

## 2016-10-04 DIAGNOSIS — Z9115 Patient's noncompliance with renal dialysis: Secondary | ICD-10-CM | POA: Diagnosis not present

## 2016-10-04 DIAGNOSIS — R001 Bradycardia, unspecified: Secondary | ICD-10-CM | POA: Diagnosis not present

## 2016-10-04 DIAGNOSIS — E875 Hyperkalemia: Secondary | ICD-10-CM | POA: Diagnosis not present

## 2016-10-04 DIAGNOSIS — Z8673 Personal history of transient ischemic attack (TIA), and cerebral infarction without residual deficits: Secondary | ICD-10-CM | POA: Diagnosis not present

## 2016-10-04 DIAGNOSIS — Z743 Need for continuous supervision: Secondary | ICD-10-CM | POA: Diagnosis not present

## 2016-10-04 DIAGNOSIS — D631 Anemia in chronic kidney disease: Secondary | ICD-10-CM | POA: Diagnosis not present

## 2016-10-04 DIAGNOSIS — R42 Dizziness and giddiness: Secondary | ICD-10-CM | POA: Diagnosis not present

## 2016-10-04 DIAGNOSIS — N250 Renal osteodystrophy: Secondary | ICD-10-CM | POA: Diagnosis not present

## 2016-10-07 DIAGNOSIS — N186 End stage renal disease: Secondary | ICD-10-CM | POA: Diagnosis not present

## 2016-10-07 DIAGNOSIS — N2581 Secondary hyperparathyroidism of renal origin: Secondary | ICD-10-CM | POA: Diagnosis not present

## 2016-10-07 DIAGNOSIS — Z992 Dependence on renal dialysis: Secondary | ICD-10-CM | POA: Diagnosis not present

## 2016-10-07 DIAGNOSIS — D631 Anemia in chronic kidney disease: Secondary | ICD-10-CM | POA: Diagnosis not present

## 2016-10-07 DIAGNOSIS — D509 Iron deficiency anemia, unspecified: Secondary | ICD-10-CM | POA: Diagnosis not present

## 2016-10-09 DIAGNOSIS — D631 Anemia in chronic kidney disease: Secondary | ICD-10-CM | POA: Diagnosis not present

## 2016-10-09 DIAGNOSIS — Z992 Dependence on renal dialysis: Secondary | ICD-10-CM | POA: Diagnosis not present

## 2016-10-09 DIAGNOSIS — N2581 Secondary hyperparathyroidism of renal origin: Secondary | ICD-10-CM | POA: Diagnosis not present

## 2016-10-09 DIAGNOSIS — D509 Iron deficiency anemia, unspecified: Secondary | ICD-10-CM | POA: Diagnosis not present

## 2016-10-09 DIAGNOSIS — N186 End stage renal disease: Secondary | ICD-10-CM | POA: Diagnosis not present

## 2016-10-11 DIAGNOSIS — D509 Iron deficiency anemia, unspecified: Secondary | ICD-10-CM | POA: Diagnosis not present

## 2016-10-11 DIAGNOSIS — D631 Anemia in chronic kidney disease: Secondary | ICD-10-CM | POA: Diagnosis not present

## 2016-10-11 DIAGNOSIS — N2581 Secondary hyperparathyroidism of renal origin: Secondary | ICD-10-CM | POA: Diagnosis not present

## 2016-10-11 DIAGNOSIS — N186 End stage renal disease: Secondary | ICD-10-CM | POA: Diagnosis not present

## 2016-10-11 DIAGNOSIS — Z992 Dependence on renal dialysis: Secondary | ICD-10-CM | POA: Diagnosis not present

## 2016-10-14 DIAGNOSIS — Z992 Dependence on renal dialysis: Secondary | ICD-10-CM | POA: Diagnosis not present

## 2016-10-14 DIAGNOSIS — D631 Anemia in chronic kidney disease: Secondary | ICD-10-CM | POA: Diagnosis not present

## 2016-10-14 DIAGNOSIS — D509 Iron deficiency anemia, unspecified: Secondary | ICD-10-CM | POA: Diagnosis not present

## 2016-10-14 DIAGNOSIS — N186 End stage renal disease: Secondary | ICD-10-CM | POA: Diagnosis not present

## 2016-10-14 DIAGNOSIS — N2581 Secondary hyperparathyroidism of renal origin: Secondary | ICD-10-CM | POA: Diagnosis not present

## 2016-10-16 DIAGNOSIS — L905 Scar conditions and fibrosis of skin: Secondary | ICD-10-CM | POA: Diagnosis not present

## 2016-10-16 DIAGNOSIS — R2232 Localized swelling, mass and lump, left upper limb: Secondary | ICD-10-CM | POA: Diagnosis not present

## 2016-10-16 DIAGNOSIS — M79642 Pain in left hand: Secondary | ICD-10-CM | POA: Diagnosis not present

## 2016-10-16 DIAGNOSIS — Z9889 Other specified postprocedural states: Secondary | ICD-10-CM | POA: Diagnosis not present

## 2016-10-17 DIAGNOSIS — N186 End stage renal disease: Secondary | ICD-10-CM | POA: Diagnosis not present

## 2016-10-17 DIAGNOSIS — Z992 Dependence on renal dialysis: Secondary | ICD-10-CM | POA: Diagnosis not present

## 2016-10-18 DIAGNOSIS — N186 End stage renal disease: Secondary | ICD-10-CM | POA: Diagnosis not present

## 2016-10-18 DIAGNOSIS — N2581 Secondary hyperparathyroidism of renal origin: Secondary | ICD-10-CM | POA: Diagnosis not present

## 2016-10-18 DIAGNOSIS — Z992 Dependence on renal dialysis: Secondary | ICD-10-CM | POA: Diagnosis not present

## 2016-10-18 DIAGNOSIS — D509 Iron deficiency anemia, unspecified: Secondary | ICD-10-CM | POA: Diagnosis not present

## 2016-10-18 DIAGNOSIS — D631 Anemia in chronic kidney disease: Secondary | ICD-10-CM | POA: Diagnosis not present

## 2016-10-21 DIAGNOSIS — D631 Anemia in chronic kidney disease: Secondary | ICD-10-CM | POA: Diagnosis not present

## 2016-10-21 DIAGNOSIS — N2581 Secondary hyperparathyroidism of renal origin: Secondary | ICD-10-CM | POA: Diagnosis not present

## 2016-10-21 DIAGNOSIS — Z992 Dependence on renal dialysis: Secondary | ICD-10-CM | POA: Diagnosis not present

## 2016-10-21 DIAGNOSIS — D509 Iron deficiency anemia, unspecified: Secondary | ICD-10-CM | POA: Diagnosis not present

## 2016-10-21 DIAGNOSIS — N186 End stage renal disease: Secondary | ICD-10-CM | POA: Diagnosis not present

## 2016-10-23 DIAGNOSIS — N2581 Secondary hyperparathyroidism of renal origin: Secondary | ICD-10-CM | POA: Diagnosis not present

## 2016-10-23 DIAGNOSIS — D509 Iron deficiency anemia, unspecified: Secondary | ICD-10-CM | POA: Diagnosis not present

## 2016-10-23 DIAGNOSIS — Z992 Dependence on renal dialysis: Secondary | ICD-10-CM | POA: Diagnosis not present

## 2016-10-23 DIAGNOSIS — D631 Anemia in chronic kidney disease: Secondary | ICD-10-CM | POA: Diagnosis not present

## 2016-10-23 DIAGNOSIS — N186 End stage renal disease: Secondary | ICD-10-CM | POA: Diagnosis not present

## 2016-10-24 DIAGNOSIS — F17210 Nicotine dependence, cigarettes, uncomplicated: Secondary | ICD-10-CM | POA: Diagnosis not present

## 2016-10-24 DIAGNOSIS — I5022 Chronic systolic (congestive) heart failure: Secondary | ICD-10-CM | POA: Diagnosis not present

## 2016-10-24 DIAGNOSIS — N19 Unspecified kidney failure: Secondary | ICD-10-CM | POA: Diagnosis not present

## 2016-10-24 DIAGNOSIS — T82848A Pain from vascular prosthetic devices, implants and grafts, initial encounter: Secondary | ICD-10-CM | POA: Diagnosis not present

## 2016-10-24 DIAGNOSIS — I132 Hypertensive heart and chronic kidney disease with heart failure and with stage 5 chronic kidney disease, or end stage renal disease: Secondary | ICD-10-CM | POA: Diagnosis not present

## 2016-10-24 DIAGNOSIS — Z992 Dependence on renal dialysis: Secondary | ICD-10-CM | POA: Diagnosis not present

## 2016-10-24 DIAGNOSIS — Z886 Allergy status to analgesic agent status: Secondary | ICD-10-CM | POA: Diagnosis not present

## 2016-10-24 DIAGNOSIS — T82828A Fibrosis of vascular prosthetic devices, implants and grafts, initial encounter: Secondary | ICD-10-CM | POA: Diagnosis not present

## 2016-10-24 DIAGNOSIS — N186 End stage renal disease: Secondary | ICD-10-CM | POA: Diagnosis not present

## 2016-10-24 DIAGNOSIS — T82898A Other specified complication of vascular prosthetic devices, implants and grafts, initial encounter: Secondary | ICD-10-CM | POA: Diagnosis not present

## 2016-10-24 DIAGNOSIS — Z8673 Personal history of transient ischemic attack (TIA), and cerebral infarction without residual deficits: Secondary | ICD-10-CM | POA: Diagnosis not present

## 2016-10-24 DIAGNOSIS — Z4901 Encounter for fitting and adjustment of extracorporeal dialysis catheter: Secondary | ICD-10-CM | POA: Diagnosis not present

## 2016-10-25 DIAGNOSIS — N186 End stage renal disease: Secondary | ICD-10-CM | POA: Diagnosis not present

## 2016-10-25 DIAGNOSIS — Z992 Dependence on renal dialysis: Secondary | ICD-10-CM | POA: Diagnosis not present

## 2016-10-25 DIAGNOSIS — N2581 Secondary hyperparathyroidism of renal origin: Secondary | ICD-10-CM | POA: Diagnosis not present

## 2016-10-25 DIAGNOSIS — D631 Anemia in chronic kidney disease: Secondary | ICD-10-CM | POA: Diagnosis not present

## 2016-10-25 DIAGNOSIS — D509 Iron deficiency anemia, unspecified: Secondary | ICD-10-CM | POA: Diagnosis not present

## 2016-10-27 DIAGNOSIS — D509 Iron deficiency anemia, unspecified: Secondary | ICD-10-CM | POA: Diagnosis not present

## 2016-10-27 DIAGNOSIS — N2581 Secondary hyperparathyroidism of renal origin: Secondary | ICD-10-CM | POA: Diagnosis not present

## 2016-10-27 DIAGNOSIS — N186 End stage renal disease: Secondary | ICD-10-CM | POA: Diagnosis not present

## 2016-10-27 DIAGNOSIS — Z992 Dependence on renal dialysis: Secondary | ICD-10-CM | POA: Diagnosis not present

## 2016-10-27 DIAGNOSIS — D631 Anemia in chronic kidney disease: Secondary | ICD-10-CM | POA: Diagnosis not present

## 2016-10-30 DIAGNOSIS — Z992 Dependence on renal dialysis: Secondary | ICD-10-CM | POA: Diagnosis not present

## 2016-10-30 DIAGNOSIS — N186 End stage renal disease: Secondary | ICD-10-CM | POA: Diagnosis not present

## 2016-10-30 DIAGNOSIS — N2581 Secondary hyperparathyroidism of renal origin: Secondary | ICD-10-CM | POA: Diagnosis not present

## 2016-10-30 DIAGNOSIS — D631 Anemia in chronic kidney disease: Secondary | ICD-10-CM | POA: Diagnosis not present

## 2016-10-30 DIAGNOSIS — D509 Iron deficiency anemia, unspecified: Secondary | ICD-10-CM | POA: Diagnosis not present

## 2016-11-01 DIAGNOSIS — N2581 Secondary hyperparathyroidism of renal origin: Secondary | ICD-10-CM | POA: Diagnosis not present

## 2016-11-01 DIAGNOSIS — N186 End stage renal disease: Secondary | ICD-10-CM | POA: Diagnosis not present

## 2016-11-01 DIAGNOSIS — D631 Anemia in chronic kidney disease: Secondary | ICD-10-CM | POA: Diagnosis not present

## 2016-11-01 DIAGNOSIS — D509 Iron deficiency anemia, unspecified: Secondary | ICD-10-CM | POA: Diagnosis not present

## 2016-11-01 DIAGNOSIS — Z992 Dependence on renal dialysis: Secondary | ICD-10-CM | POA: Diagnosis not present

## 2016-11-03 DIAGNOSIS — N186 End stage renal disease: Secondary | ICD-10-CM | POA: Diagnosis not present

## 2016-11-03 DIAGNOSIS — Z992 Dependence on renal dialysis: Secondary | ICD-10-CM | POA: Diagnosis not present

## 2016-11-03 DIAGNOSIS — D631 Anemia in chronic kidney disease: Secondary | ICD-10-CM | POA: Diagnosis not present

## 2016-11-03 DIAGNOSIS — N2581 Secondary hyperparathyroidism of renal origin: Secondary | ICD-10-CM | POA: Diagnosis not present

## 2016-11-03 DIAGNOSIS — D509 Iron deficiency anemia, unspecified: Secondary | ICD-10-CM | POA: Diagnosis not present

## 2016-11-04 DIAGNOSIS — N186 End stage renal disease: Secondary | ICD-10-CM | POA: Diagnosis not present

## 2016-11-04 DIAGNOSIS — Z992 Dependence on renal dialysis: Secondary | ICD-10-CM | POA: Diagnosis not present

## 2016-11-06 DIAGNOSIS — N186 End stage renal disease: Secondary | ICD-10-CM | POA: Diagnosis not present

## 2016-11-06 DIAGNOSIS — D509 Iron deficiency anemia, unspecified: Secondary | ICD-10-CM | POA: Diagnosis not present

## 2016-11-06 DIAGNOSIS — N2581 Secondary hyperparathyroidism of renal origin: Secondary | ICD-10-CM | POA: Diagnosis not present

## 2016-11-06 DIAGNOSIS — D631 Anemia in chronic kidney disease: Secondary | ICD-10-CM | POA: Diagnosis not present

## 2016-11-06 DIAGNOSIS — T7840XA Allergy, unspecified, initial encounter: Secondary | ICD-10-CM | POA: Diagnosis not present

## 2016-11-06 DIAGNOSIS — Z992 Dependence on renal dialysis: Secondary | ICD-10-CM | POA: Diagnosis not present

## 2016-11-08 DIAGNOSIS — T7840XA Allergy, unspecified, initial encounter: Secondary | ICD-10-CM | POA: Diagnosis not present

## 2016-11-08 DIAGNOSIS — Z992 Dependence on renal dialysis: Secondary | ICD-10-CM | POA: Diagnosis not present

## 2016-11-08 DIAGNOSIS — N2581 Secondary hyperparathyroidism of renal origin: Secondary | ICD-10-CM | POA: Diagnosis not present

## 2016-11-08 DIAGNOSIS — N186 End stage renal disease: Secondary | ICD-10-CM | POA: Diagnosis not present

## 2016-11-08 DIAGNOSIS — D509 Iron deficiency anemia, unspecified: Secondary | ICD-10-CM | POA: Diagnosis not present

## 2016-11-08 DIAGNOSIS — D631 Anemia in chronic kidney disease: Secondary | ICD-10-CM | POA: Diagnosis not present

## 2016-11-11 DIAGNOSIS — N2581 Secondary hyperparathyroidism of renal origin: Secondary | ICD-10-CM | POA: Diagnosis not present

## 2016-11-11 DIAGNOSIS — D631 Anemia in chronic kidney disease: Secondary | ICD-10-CM | POA: Diagnosis not present

## 2016-11-11 DIAGNOSIS — N186 End stage renal disease: Secondary | ICD-10-CM | POA: Diagnosis not present

## 2016-11-11 DIAGNOSIS — D509 Iron deficiency anemia, unspecified: Secondary | ICD-10-CM | POA: Diagnosis not present

## 2016-11-11 DIAGNOSIS — Z992 Dependence on renal dialysis: Secondary | ICD-10-CM | POA: Diagnosis not present

## 2016-11-11 DIAGNOSIS — T7840XA Allergy, unspecified, initial encounter: Secondary | ICD-10-CM | POA: Diagnosis not present

## 2016-11-13 DIAGNOSIS — Z992 Dependence on renal dialysis: Secondary | ICD-10-CM | POA: Diagnosis not present

## 2016-11-13 DIAGNOSIS — D509 Iron deficiency anemia, unspecified: Secondary | ICD-10-CM | POA: Diagnosis not present

## 2016-11-13 DIAGNOSIS — T7840XA Allergy, unspecified, initial encounter: Secondary | ICD-10-CM | POA: Diagnosis not present

## 2016-11-13 DIAGNOSIS — N186 End stage renal disease: Secondary | ICD-10-CM | POA: Diagnosis not present

## 2016-11-13 DIAGNOSIS — D631 Anemia in chronic kidney disease: Secondary | ICD-10-CM | POA: Diagnosis not present

## 2016-11-13 DIAGNOSIS — N2581 Secondary hyperparathyroidism of renal origin: Secondary | ICD-10-CM | POA: Diagnosis not present

## 2016-11-15 DIAGNOSIS — D631 Anemia in chronic kidney disease: Secondary | ICD-10-CM | POA: Diagnosis not present

## 2016-11-15 DIAGNOSIS — T7840XA Allergy, unspecified, initial encounter: Secondary | ICD-10-CM | POA: Diagnosis not present

## 2016-11-15 DIAGNOSIS — N2581 Secondary hyperparathyroidism of renal origin: Secondary | ICD-10-CM | POA: Diagnosis not present

## 2016-11-15 DIAGNOSIS — N186 End stage renal disease: Secondary | ICD-10-CM | POA: Diagnosis not present

## 2016-11-15 DIAGNOSIS — Z992 Dependence on renal dialysis: Secondary | ICD-10-CM | POA: Diagnosis not present

## 2016-11-15 DIAGNOSIS — D509 Iron deficiency anemia, unspecified: Secondary | ICD-10-CM | POA: Diagnosis not present

## 2016-11-18 DIAGNOSIS — N2581 Secondary hyperparathyroidism of renal origin: Secondary | ICD-10-CM | POA: Diagnosis not present

## 2016-11-18 DIAGNOSIS — T7840XA Allergy, unspecified, initial encounter: Secondary | ICD-10-CM | POA: Diagnosis not present

## 2016-11-18 DIAGNOSIS — D631 Anemia in chronic kidney disease: Secondary | ICD-10-CM | POA: Diagnosis not present

## 2016-11-18 DIAGNOSIS — N186 End stage renal disease: Secondary | ICD-10-CM | POA: Diagnosis not present

## 2016-11-18 DIAGNOSIS — D509 Iron deficiency anemia, unspecified: Secondary | ICD-10-CM | POA: Diagnosis not present

## 2016-11-18 DIAGNOSIS — Z992 Dependence on renal dialysis: Secondary | ICD-10-CM | POA: Diagnosis not present

## 2016-11-20 DIAGNOSIS — N189 Chronic kidney disease, unspecified: Secondary | ICD-10-CM | POA: Insufficient documentation

## 2016-11-20 DIAGNOSIS — N2581 Secondary hyperparathyroidism of renal origin: Secondary | ICD-10-CM | POA: Insufficient documentation

## 2016-11-20 DIAGNOSIS — T829XXA Unspecified complication of cardiac and vascular prosthetic device, implant and graft, initial encounter: Secondary | ICD-10-CM | POA: Insufficient documentation

## 2016-11-20 DIAGNOSIS — L299 Pruritus, unspecified: Secondary | ICD-10-CM | POA: Insufficient documentation

## 2016-11-20 DIAGNOSIS — D631 Anemia in chronic kidney disease: Secondary | ICD-10-CM | POA: Insufficient documentation

## 2016-11-20 DIAGNOSIS — D689 Coagulation defect, unspecified: Secondary | ICD-10-CM | POA: Insufficient documentation

## 2016-11-20 DIAGNOSIS — R0602 Shortness of breath: Secondary | ICD-10-CM | POA: Insufficient documentation

## 2016-11-20 DIAGNOSIS — D509 Iron deficiency anemia, unspecified: Secondary | ICD-10-CM | POA: Insufficient documentation

## 2016-11-21 DIAGNOSIS — Z992 Dependence on renal dialysis: Secondary | ICD-10-CM | POA: Diagnosis not present

## 2016-11-21 DIAGNOSIS — N186 End stage renal disease: Secondary | ICD-10-CM | POA: Diagnosis not present

## 2016-11-21 DIAGNOSIS — D509 Iron deficiency anemia, unspecified: Secondary | ICD-10-CM | POA: Diagnosis not present

## 2016-11-21 DIAGNOSIS — N2581 Secondary hyperparathyroidism of renal origin: Secondary | ICD-10-CM | POA: Diagnosis not present

## 2016-11-23 DIAGNOSIS — D509 Iron deficiency anemia, unspecified: Secondary | ICD-10-CM | POA: Diagnosis not present

## 2016-11-23 DIAGNOSIS — N186 End stage renal disease: Secondary | ICD-10-CM | POA: Diagnosis not present

## 2016-11-23 DIAGNOSIS — Z992 Dependence on renal dialysis: Secondary | ICD-10-CM | POA: Diagnosis not present

## 2016-11-23 DIAGNOSIS — N2581 Secondary hyperparathyroidism of renal origin: Secondary | ICD-10-CM | POA: Diagnosis not present

## 2016-11-25 ENCOUNTER — Encounter (HOSPITAL_COMMUNITY): Payer: Self-pay | Admitting: Emergency Medicine

## 2016-11-25 DIAGNOSIS — R1084 Generalized abdominal pain: Secondary | ICD-10-CM | POA: Diagnosis not present

## 2016-11-25 DIAGNOSIS — I10 Essential (primary) hypertension: Secondary | ICD-10-CM | POA: Diagnosis not present

## 2016-11-25 DIAGNOSIS — Z79899 Other long term (current) drug therapy: Secondary | ICD-10-CM | POA: Insufficient documentation

## 2016-11-25 DIAGNOSIS — F17200 Nicotine dependence, unspecified, uncomplicated: Secondary | ICD-10-CM | POA: Diagnosis not present

## 2016-11-25 DIAGNOSIS — J111 Influenza due to unidentified influenza virus with other respiratory manifestations: Secondary | ICD-10-CM | POA: Diagnosis not present

## 2016-11-25 DIAGNOSIS — Z7982 Long term (current) use of aspirin: Secondary | ICD-10-CM | POA: Diagnosis not present

## 2016-11-25 DIAGNOSIS — R05 Cough: Secondary | ICD-10-CM | POA: Insufficient documentation

## 2016-11-25 DIAGNOSIS — R509 Fever, unspecified: Secondary | ICD-10-CM | POA: Insufficient documentation

## 2016-11-25 NOTE — ED Triage Notes (Signed)
Pt states he is having generalized weakness, dizziness, diarrhea x 2 days, cough, and general fatigue  Pt is a dialysis pt and receives treatment on Tuesday, Thursday and Saturday

## 2016-11-26 ENCOUNTER — Emergency Department (HOSPITAL_COMMUNITY)
Admission: EM | Admit: 2016-11-26 | Discharge: 2016-11-26 | Disposition: A | Discharge status: Home / Self Care | Payer: Medicare Other | Attending: Emergency Medicine | Admitting: Emergency Medicine

## 2016-11-26 ENCOUNTER — Emergency Department (HOSPITAL_COMMUNITY): Payer: Medicare Other

## 2016-11-26 DIAGNOSIS — N2581 Secondary hyperparathyroidism of renal origin: Secondary | ICD-10-CM | POA: Diagnosis not present

## 2016-11-26 DIAGNOSIS — N186 End stage renal disease: Secondary | ICD-10-CM | POA: Diagnosis not present

## 2016-11-26 DIAGNOSIS — Z992 Dependence on renal dialysis: Secondary | ICD-10-CM | POA: Diagnosis not present

## 2016-11-26 DIAGNOSIS — R6889 Other general symptoms and signs: Secondary | ICD-10-CM

## 2016-11-26 DIAGNOSIS — D509 Iron deficiency anemia, unspecified: Secondary | ICD-10-CM | POA: Diagnosis not present

## 2016-11-26 DIAGNOSIS — R05 Cough: Secondary | ICD-10-CM | POA: Diagnosis not present

## 2016-11-26 HISTORY — DX: Unspecified kidney failure: N19

## 2016-11-26 HISTORY — DX: Essential (primary) hypertension: I10

## 2016-11-26 LAB — CBC WITH DIFFERENTIAL/PLATELET
Basophils Absolute: 0 10*3/uL (ref 0.0–0.1)
Basophils Relative: 0 %
EOS PCT: 2 %
Eosinophils Absolute: 0.1 10*3/uL (ref 0.0–0.7)
HCT: 34.3 % — ABNORMAL LOW (ref 39.0–52.0)
Hemoglobin: 11.8 g/dL — ABNORMAL LOW (ref 13.0–17.0)
LYMPHS ABS: 0.8 10*3/uL (ref 0.7–4.0)
LYMPHS PCT: 21 %
MCH: 30.3 pg (ref 26.0–34.0)
MCHC: 34.4 g/dL (ref 30.0–36.0)
MCV: 88.2 fL (ref 78.0–100.0)
MONO ABS: 0.4 10*3/uL (ref 0.1–1.0)
Monocytes Relative: 11 %
Neutro Abs: 2.6 10*3/uL (ref 1.7–7.7)
Neutrophils Relative %: 66 %
PLATELETS: 98 10*3/uL — AB (ref 150–400)
RBC: 3.89 MIL/uL — ABNORMAL LOW (ref 4.22–5.81)
RDW: 17.7 % — AB (ref 11.5–15.5)
WBC: 3.9 10*3/uL — ABNORMAL LOW (ref 4.0–10.5)

## 2016-11-26 LAB — COMPREHENSIVE METABOLIC PANEL
ALT: 15 U/L — ABNORMAL LOW (ref 17–63)
AST: 31 U/L (ref 15–41)
Albumin: 3.3 g/dL — ABNORMAL LOW (ref 3.5–5.0)
Alkaline Phosphatase: 78 U/L (ref 38–126)
Anion gap: 16 — ABNORMAL HIGH (ref 5–15)
BUN: 77 mg/dL — ABNORMAL HIGH (ref 6–20)
CHLORIDE: 93 mmol/L — AB (ref 101–111)
CO2: 19 mmol/L — ABNORMAL LOW (ref 22–32)
CREATININE: 16.39 mg/dL — AB (ref 0.61–1.24)
Calcium: 6.8 mg/dL — ABNORMAL LOW (ref 8.9–10.3)
GFR, EST AFRICAN AMERICAN: 4 mL/min — AB (ref >60)
GFR, EST NON AFRICAN AMERICAN: 3 mL/min — AB (ref >60)
Glucose, Bld: 79 mg/dL (ref 65–99)
POTASSIUM: 4.8 mmol/L (ref 3.5–5.1)
Sodium: 128 mmol/L — ABNORMAL LOW (ref 135–145)
Total Bilirubin: 0.5 mg/dL (ref 0.3–1.2)
Total Protein: 8.2 g/dL — ABNORMAL HIGH (ref 6.5–8.1)

## 2016-11-26 LAB — LIPASE, BLOOD: LIPASE: 97 U/L — AB (ref 11–51)

## 2016-11-26 MED ORDER — ACETAMINOPHEN 325 MG PO TABS
650.0000 mg | ORAL_TABLET | Freq: Once | ORAL | Status: AC
  Administered 2016-11-26: 650 mg via ORAL
  Filled 2016-11-26: qty 2

## 2016-11-26 MED ORDER — SODIUM CHLORIDE 0.9 % IV BOLUS (SEPSIS)
500.0000 mL | Freq: Once | INTRAVENOUS | Status: AC
  Administered 2016-11-26: 500 mL via INTRAVENOUS

## 2016-11-26 NOTE — ED Provider Notes (Signed)
[facility] DEPT Provider Note   CSN: [id number] Arrival date & time: 11/25/16  1931     History   Chief Complaint Chief Complaint  Patient presents with  . Weakness  . Cough    HPI Marvin Roberson is a 46 y.o. male who presents with flu like illness. PMH significant for ESRD on dialysis (Tu, Th, Sat) and HTN. He states for the past 3 days he has had subjective fever, chills, lightheadedness, headache, rhinorrhea/congestion, sore throat, chest pain after coughing, productive cough, SOB, abdominal pain, N/V/D. He was last dialyzed on Sat. He has not had his flu shot. Reports sick contacts with his relatives. Has not taken any OTC meds.  HPI  Past Medical History:  Diagnosis Date  . Hypertension   . Kidney failure     There are no active problems to display for this patient.   Past Surgical History:  Procedure Laterality Date  . arm surgery         Home Medications    Prior to Admission medications   Medication Sig Start Date End Date Taking? Authorizing Provider  aspirin EC 81 MG tablet Take 81 mg by mouth daily.   Yes Historical Provider, MD  atorvastatin (LIPITOR) 40 MG tablet Take 40 mg by mouth daily.   Yes Historical Provider, MD  carvedilol (COREG) 25 MG tablet Take 25 mg by mouth 2 (two) times daily with a meal.   Yes Historical Provider, MD  hydrALAZINE (APRESOLINE) 100 MG tablet Take 100 mg by mouth 3 (three) times daily.   Yes Historical Provider, MD  hydrocortisone (CORTEF) 10 MG tablet Take 5 mg by mouth daily.    Yes Historical Provider, MD  losartan (COZAAR) 100 MG tablet Take 100 mg by mouth daily.   Yes Historical Provider, MD  omeprazole (PRILOSEC) 40 MG capsule Take 40 mg by mouth daily.   Yes Historical Provider, MD    Family History History reviewed. No pertinent family history.  Social History Social History  Substance Use Topics  . Smoking status: Current Every Day Smoker  . Smokeless tobacco: Never Used  . Alcohol use No      Allergies   Betadine [povidone iodine]; Gabapentin; and Lisinopril   Review of Systems Review of Systems  Constitutional: Positive for chills, fatigue and fever.  HENT: Positive for congestion, rhinorrhea and sore throat. Negative for ear pain.   Respiratory: Positive for cough. Negative for shortness of breath.   Cardiovascular: Positive for chest pain (with coughing).  Gastrointestinal: Positive for abdominal pain, diarrhea, nausea and vomiting.  Genitourinary: Negative for dysuria.  All other systems reviewed and are negative.    Physical Exam Updated Vital Signs BP (!) 99/54   Pulse 77   Temp 99.4 F (37.4 C) (Oral)   Resp 10   Ht 5\' 7"  (1.702 m)   Wt 61.5 kg   SpO2 94%   BMI 21.24 kg/m   Physical Exam  Constitutional: He is oriented to person, place, and time. He appears well-developed and well-nourished. No distress.  HENT:  Head: Normocephalic and atraumatic.  Right Ear: Hearing, tympanic membrane, external ear and ear canal normal.  Left Ear: Hearing, tympanic membrane, external ear and ear canal normal.  Nose: No mucosal edema or rhinorrhea.  Mouth/Throat: Uvula is midline, oropharynx is clear and moist and mucous membranes are normal.  Eyes: Conjunctivae are normal. Pupils are equal, round, and reactive to light. Right eye exhibits no discharge. Left eye exhibits no discharge. No scleral icterus.  Neck:  Normal range of motion.  Cardiovascular: Normal rate and regular rhythm.  Exam reveals no gallop and no friction rub.   No murmur heard. Pulmonary/Chest: Effort normal and breath sounds normal. No respiratory distress. He has no wheezes. He has no rales. He exhibits no tenderness.  Port-a-cath in place, non-tender  Abdominal: Soft. Bowel sounds are normal. He exhibits no distension and no mass. There is tenderness. There is no rebound and no guarding. No hernia.  Mild, generalized tenderness  Neurological: He is alert and oriented to person, place, and  time.  Skin: Skin is warm and dry.  Psychiatric: He has a normal mood and affect. His behavior is normal.  Nursing note and vitals reviewed.    ED Treatments / Results  Labs (all labs ordered are listed, but only abnormal results are displayed) Labs Reviewed  CBC WITH DIFFERENTIAL/PLATELET - Abnormal; Notable for the following:       Result Value   WBC 3.9 (*)    RBC 3.89 (*)    Hemoglobin 11.8 (*)    HCT 34.3 (*)    RDW 17.7 (*)    Platelets 98 (*)    All other components within normal limits  COMPREHENSIVE METABOLIC PANEL - Abnormal; Notable for the following:    Sodium 128 (*)    Chloride 93 (*)    CO2 19 (*)    BUN 77 (*)    Creatinine, Ser 16.39 (*)    Calcium 6.8 (*)    Total Protein 8.2 (*)    Albumin 3.3 (*)    ALT 15 (*)    GFR calc non Af Amer 3 (*)    GFR calc Af Amer 4 (*)    Anion gap 16 (*)    All other components within normal limits  LIPASE, BLOOD - Abnormal; Notable for the following:    Lipase 97 (*)    All other components within normal limits  URINALYSIS, ROUTINE W REFLEX MICROSCOPIC    EKG  EKG Interpretation  Date/Time:  Tuesday November 26 2016 02:18:10 EST Ventricular Rate:  78 PR Interval:    QRS Duration: 108 QT Interval:  417 QTC Calculation: 475 R Axis:   -36 Text Interpretation:  Sinus rhythm Prolonged PR interval Left axis deviation Borderline low voltage, extremity leads No old tracing to compare Confirmed by Glynn Octave [phone]) on 11/26/2016 4:05:26 AM       Radiology Dg Chest 2 View  Result Date: 11/26/2016 CLINICAL DATA:  Initial evaluation for generalized weakness with chills and cough. EXAM: CHEST  2 VIEW COMPARISON:  None available. FINDINGS: Mild cardiomegaly. Mediastinal silhouette within normal limits. Right-sided central venous catheter terminates at the right atrium. Lungs are clear. No focal infiltrates. No pulmonary edema or pleural effusion. No pneumothorax. No acute osseous abnormality. Surgical clips of  vascular stent overlie the left axillary region. IMPRESSION: No active cardiopulmonary disease identified. Electronically Signed   By: Jeannine Boga M.D.   On: 11/26/2016 02:55    Procedures Procedures (including critical care time)  Medications Ordered in ED Medications  sodium chloride 0.9 % bolus 500 mL (0 mLs Intravenous Stopped 11/26/16 0503)  acetaminophen (TYLENOL) tablet 650 mg (650 mg Oral Given 11/26/16 1308)     Initial Impression / Assessment and Plan / ED Course  I have reviewed the triage vital signs and the nursing notes.  Pertinent labs & imaging results that were available during my care of the patient were reviewed by me and considered in my medical decision  making (see chart for details).  46 year old male with ESRD with flu like symtpoms. Blood pressure is soft - will given 500cc IVF as well as Tylenol for elevated temp. Otherwise vitals are normal. He did have a reading of hypoxia however this was transient when he was asleep and otherwise has normal sats on RA. CBC remarkable for mild anemia. CMP has many derangements - most likely chronic due to ESRD. Labs reviewed by Dr. Claudine Mouton who feels this is close to his baseline. CXR negative. EKG shows SR with prolonged PR.  On recheck, patient is resting comfortably. He is tolerating PO. Will d/c with strict return precautions.   Final Clinical Impressions(s) / ED Diagnoses   Final diagnoses:  Flu-like symptoms    New Prescriptions New Prescriptions   No medications on file     Recardo Evangelist, PA-C 11/26/16 2126    Everlene Balls, MD 11/28/16 [phone]

## 2016-11-26 NOTE — ED Notes (Signed)
Pt unable to urinate.

## 2016-11-26 NOTE — ED Notes (Signed)
Pt states that he has been having generalized weakness, chills with a productive coughing x 5 days.  Pt does report having diarrhea x 2 days, and nausea and 1 episode of vomiting.

## 2016-11-26 NOTE — Discharge Instructions (Signed)
Please follow up with Dialysis clinic today Take tylenol as needed for fever and chills Follow up with nephrologist regarding fistula

## 2016-11-27 ENCOUNTER — Observation Stay (HOSPITAL_COMMUNITY)
Admission: EM | Admit: 2016-11-27 | Discharge: 2016-11-29 | Disposition: A | Discharge status: Home / Self Care | Payer: Medicare Other | Attending: Internal Medicine | Admitting: Internal Medicine

## 2016-11-27 ENCOUNTER — Other Ambulatory Visit (HOSPITAL_COMMUNITY): Payer: Medicare Other

## 2016-11-27 ENCOUNTER — Encounter (HOSPITAL_COMMUNITY): Payer: Self-pay | Admitting: *Deleted

## 2016-11-27 DIAGNOSIS — Z992 Dependence on renal dialysis: Secondary | ICD-10-CM | POA: Diagnosis not present

## 2016-11-27 DIAGNOSIS — R410 Disorientation, unspecified: Secondary | ICD-10-CM

## 2016-11-27 DIAGNOSIS — R7989 Other specified abnormal findings of blood chemistry: Secondary | ICD-10-CM

## 2016-11-27 DIAGNOSIS — J09X2 Influenza due to identified novel influenza A virus with other respiratory manifestations: Secondary | ICD-10-CM | POA: Diagnosis not present

## 2016-11-27 DIAGNOSIS — E7800 Pure hypercholesterolemia, unspecified: Secondary | ICD-10-CM | POA: Diagnosis not present

## 2016-11-27 DIAGNOSIS — I10 Essential (primary) hypertension: Secondary | ICD-10-CM | POA: Diagnosis present

## 2016-11-27 DIAGNOSIS — R0902 Hypoxemia: Secondary | ICD-10-CM | POA: Insufficient documentation

## 2016-11-27 DIAGNOSIS — Z8673 Personal history of transient ischemic attack (TIA), and cerebral infarction without residual deficits: Secondary | ICD-10-CM | POA: Diagnosis not present

## 2016-11-27 DIAGNOSIS — R112 Nausea with vomiting, unspecified: Secondary | ICD-10-CM | POA: Diagnosis present

## 2016-11-27 DIAGNOSIS — I959 Hypotension, unspecified: Secondary | ICD-10-CM | POA: Diagnosis not present

## 2016-11-27 DIAGNOSIS — D472 Monoclonal gammopathy: Secondary | ICD-10-CM | POA: Insufficient documentation

## 2016-11-27 DIAGNOSIS — I120 Hypertensive chronic kidney disease with stage 5 chronic kidney disease or end stage renal disease: Secondary | ICD-10-CM | POA: Diagnosis not present

## 2016-11-27 DIAGNOSIS — Z7982 Long term (current) use of aspirin: Secondary | ICD-10-CM | POA: Diagnosis not present

## 2016-11-27 DIAGNOSIS — E160 Drug-induced hypoglycemia without coma: Secondary | ICD-10-CM | POA: Diagnosis present

## 2016-11-27 DIAGNOSIS — R197 Diarrhea, unspecified: Secondary | ICD-10-CM | POA: Diagnosis present

## 2016-11-27 DIAGNOSIS — E162 Hypoglycemia, unspecified: Secondary | ICD-10-CM | POA: Diagnosis not present

## 2016-11-27 DIAGNOSIS — R6889 Other general symptoms and signs: Secondary | ICD-10-CM | POA: Diagnosis present

## 2016-11-27 DIAGNOSIS — Z7952 Long term (current) use of systemic steroids: Secondary | ICD-10-CM | POA: Insufficient documentation

## 2016-11-27 DIAGNOSIS — E2740 Unspecified adrenocortical insufficiency: Principal | ICD-10-CM

## 2016-11-27 DIAGNOSIS — K219 Gastro-esophageal reflux disease without esophagitis: Secondary | ICD-10-CM | POA: Diagnosis not present

## 2016-11-27 DIAGNOSIS — N186 End stage renal disease: Secondary | ICD-10-CM | POA: Diagnosis present

## 2016-11-27 DIAGNOSIS — T383X5A Adverse effect of insulin and oral hypoglycemic [antidiabetic] drugs, initial encounter: Secondary | ICD-10-CM

## 2016-11-27 DIAGNOSIS — R778 Other specified abnormalities of plasma proteins: Secondary | ICD-10-CM

## 2016-11-27 HISTORY — DX: Gastro-esophageal reflux disease without esophagitis: K21.9

## 2016-11-27 HISTORY — DX: End stage renal disease: Z99.2

## 2016-11-27 HISTORY — DX: End stage renal disease: N18.6

## 2016-11-27 HISTORY — DX: Pure hypercholesterolemia, unspecified: E78.00

## 2016-11-27 HISTORY — DX: Cerebral infarction, unspecified: I63.9

## 2016-11-27 LAB — COMPREHENSIVE METABOLIC PANEL
ALBUMIN: 2.5 g/dL — AB (ref 3.5–5.0)
ALT: 15 U/L — ABNORMAL LOW (ref 17–63)
ANION GAP: 12 (ref 5–15)
AST: 49 U/L — ABNORMAL HIGH (ref 15–41)
Alkaline Phosphatase: 79 U/L (ref 38–126)
BILIRUBIN TOTAL: 0.8 mg/dL (ref 0.3–1.2)
BUN: 37 mg/dL — ABNORMAL HIGH (ref 6–20)
CO2: 21 mmol/L — ABNORMAL LOW (ref 22–32)
Calcium: 6.8 mg/dL — ABNORMAL LOW (ref 8.9–10.3)
Chloride: 96 mmol/L — ABNORMAL LOW (ref 101–111)
Creatinine, Ser: 11.31 mg/dL — ABNORMAL HIGH (ref 0.61–1.24)
GFR calc non Af Amer: 5 mL/min — ABNORMAL LOW (ref >60)
GFR, EST AFRICAN AMERICAN: 6 mL/min — AB (ref >60)
GLUCOSE: 284 mg/dL — AB (ref 65–99)
POTASSIUM: 3.8 mmol/L (ref 3.5–5.1)
SODIUM: 129 mmol/L — AB (ref 135–145)
TOTAL PROTEIN: 7.2 g/dL (ref 6.5–8.1)

## 2016-11-27 LAB — CBC WITH DIFFERENTIAL/PLATELET
BASOS ABS: 0 10*3/uL (ref 0.0–0.1)
BASOS PCT: 0 %
EOS ABS: 0.1 10*3/uL (ref 0.0–0.7)
EOS PCT: 2 %
HCT: 27.8 % — ABNORMAL LOW (ref 39.0–52.0)
Hemoglobin: 9.2 g/dL — ABNORMAL LOW (ref 13.0–17.0)
LYMPHS PCT: 15 %
Lymphs Abs: 0.8 10*3/uL (ref 0.7–4.0)
MCH: 30.5 pg (ref 26.0–34.0)
MCHC: 33.1 g/dL (ref 30.0–36.0)
MCV: 92.1 fL (ref 78.0–100.0)
Monocytes Absolute: 0.3 10*3/uL (ref 0.1–1.0)
Monocytes Relative: 6 %
Neutro Abs: 4 10*3/uL (ref 1.7–7.7)
Neutrophils Relative %: 77 %
PLATELETS: 67 10*3/uL — AB (ref 150–400)
RBC: 3.02 MIL/uL — AB (ref 4.22–5.81)
RDW: 17.8 % — AB (ref 11.5–15.5)
WBC: 5.2 10*3/uL (ref 4.0–10.5)

## 2016-11-27 LAB — GLUCOSE, CAPILLARY
GLUCOSE-CAPILLARY: 57 mg/dL — AB (ref 65–99)
Glucose-Capillary: 151 mg/dL — ABNORMAL HIGH (ref 65–99)

## 2016-11-27 LAB — CBG MONITORING, ED
GLUCOSE-CAPILLARY: 94 mg/dL (ref 65–99)
GLUCOSE-CAPILLARY: 170 mg/dL — AB (ref 65–99)
GLUCOSE-CAPILLARY: 196 mg/dL — AB (ref 65–99)
GLUCOSE-CAPILLARY: 83 mg/dL (ref 65–99)
Glucose-Capillary: 19 mg/dL — CL (ref 65–99)
Glucose-Capillary: 43 mg/dL — CL (ref 65–99)
Glucose-Capillary: 125 mg/dL — ABNORMAL HIGH (ref 65–99)

## 2016-11-27 LAB — INFLUENZA PANEL BY PCR (TYPE A & B)
INFLAPCR: POSITIVE — AB
INFLBPCR: NEGATIVE

## 2016-11-27 LAB — TROPONIN I
TROPONIN I: 0.67 ng/mL — AB (ref <0.03)
Troponin I: 0.61 ng/mL (ref <0.03)
Troponin I: 0.55 ng/mL (ref <0.03)

## 2016-11-27 LAB — ETHANOL: Alcohol, Ethyl (B): <5 mg/dL (ref <5)

## 2016-11-27 LAB — PROTIME-INR
INR: 1.22
Prothrombin Time: 15.5 seconds — ABNORMAL HIGH (ref 11.4–15.2)

## 2016-11-27 LAB — LIPASE, BLOOD: LIPASE: 78 U/L — AB (ref 11–51)

## 2016-11-27 LAB — CK: Total CK: 561 U/L — ABNORMAL HIGH (ref 49–397)

## 2016-11-27 LAB — I-STAT CG4 LACTIC ACID, ED: LACTIC ACID, VENOUS: 0.96 mmol/L (ref 0.5–1.9)

## 2016-11-27 MED ORDER — SODIUM CHLORIDE 0.9 % IV SOLN
INTRAVENOUS | Status: DC
  Administered 2016-11-27: 21:00:00 via INTRAVENOUS

## 2016-11-27 MED ORDER — DEXAMETHASONE SODIUM PHOSPHATE 4 MG/ML IJ SOLN
4.0000 mg | Freq: Once | INTRAMUSCULAR | Status: AC
  Administered 2016-11-27: 4 mg via INTRAVENOUS
  Filled 2016-11-27: qty 1

## 2016-11-27 MED ORDER — SODIUM CHLORIDE 0.9 % IV BOLUS (SEPSIS)
1000.0000 mL | Freq: Once | INTRAVENOUS | Status: AC
  Administered 2016-11-27: 1000 mL via INTRAVENOUS

## 2016-11-27 MED ORDER — SIMETHICONE 80 MG PO CHEW
80.0000 mg | CHEWABLE_TABLET | Freq: Four times a day (QID) | ORAL | Status: DC | PRN
  Administered 2016-11-27: 80 mg via ORAL
  Filled 2016-11-27: qty 1

## 2016-11-27 MED ORDER — PANTOPRAZOLE SODIUM 40 MG PO TBEC
40.0000 mg | DELAYED_RELEASE_TABLET | Freq: Every day | ORAL | Status: DC
  Administered 2016-11-27 – 2016-11-29 (×3): 40 mg via ORAL
  Filled 2016-11-27 (×3): qty 1

## 2016-11-27 MED ORDER — HEPARIN SODIUM (PORCINE) 5000 UNIT/ML IJ SOLN
5000.0000 [IU] | Freq: Three times a day (TID) | INTRAMUSCULAR | Status: DC
  Filled 2016-11-27 (×4): qty 1

## 2016-11-27 MED ORDER — DEXTROSE 50 % IV SOLN
INTRAVENOUS | Status: AC
  Administered 2016-11-27: 12:00:00
  Filled 2016-11-27: qty 50

## 2016-11-27 MED ORDER — TRAMADOL HCL 50 MG PO TABS
50.0000 mg | ORAL_TABLET | Freq: Once | ORAL | Status: DC
  Filled 2016-11-27 (×2): qty 1

## 2016-11-27 MED ORDER — SODIUM CHLORIDE 0.9% FLUSH
3.0000 mL | Freq: Two times a day (BID) | INTRAVENOUS | Status: DC
  Administered 2016-11-27 – 2016-11-29 (×4): 3 mL via INTRAVENOUS

## 2016-11-27 MED ORDER — ATORVASTATIN CALCIUM 40 MG PO TABS
40.0000 mg | ORAL_TABLET | Freq: Every day | ORAL | Status: DC
  Administered 2016-11-27 – 2016-11-29 (×2): 40 mg via ORAL
  Filled 2016-11-27 (×3): qty 1

## 2016-11-27 MED ORDER — CALCIUM CARBONATE ANTACID 500 MG PO CHEW
1.0000 | CHEWABLE_TABLET | Freq: Once | ORAL | Status: AC
  Administered 2016-11-27: 200 mg via ORAL
  Filled 2016-11-27: qty 1

## 2016-11-27 MED ORDER — ACETAMINOPHEN 325 MG PO TABS
650.0000 mg | ORAL_TABLET | Freq: Four times a day (QID) | ORAL | Status: DC | PRN
  Administered 2016-11-27: 650 mg via ORAL
  Filled 2016-11-27: qty 2

## 2016-11-27 MED ORDER — ACETAMINOPHEN 650 MG RE SUPP: 650.0000 mg | Freq: Four times a day (QID) | RECTAL | Status: DC | PRN

## 2016-11-27 MED ORDER — DEXAMETHASONE SODIUM PHOSPHATE 4 MG/ML IJ SOLN: 4.0000 mg | Freq: Two times a day (BID) | INTRAMUSCULAR | Status: DC

## 2016-11-27 MED ORDER — ASPIRIN EC 81 MG PO TBEC
81.0000 mg | DELAYED_RELEASE_TABLET | Freq: Every day | ORAL | Status: DC
  Administered 2016-11-27 – 2016-11-29 (×3): 81 mg via ORAL
  Filled 2016-11-27 (×3): qty 1

## 2016-11-27 NOTE — ED Notes (Signed)
SpO2 78-80% on RA. Placed on 2L [place], SpO2 remains 78-80%. Increased O2 to 6L [place], SpO2 improved to 90-93%

## 2016-11-27 NOTE — ED Triage Notes (Signed)
Per EMS - pt from home. Roommate found pt unresponsive, called EMS. Initial SBP 70, cbg 41. Given oral glucagon, pt became more responsive. Dialysis pt T/Th/Sat. Pt went to dialysis yesterday. Reports diarrhea and decreased oral intake x 1 week.  Initial CBG in ED 19, given amp d50. Dr. Vanita Panda, [place] aware. CBG improved to 94. Will continue to closely monitor.

## 2016-11-27 NOTE — ED Notes (Signed)
EKG given to Dr. Lockwood. 

## 2016-11-27 NOTE — ED Provider Notes (Signed)
[facility] DEPT Provider Note   CSN: [id number] Arrival date & time: 11/27/16  0998     History   Chief Complaint Chief Complaint  Patient presents with  . Hypoglycemia    HPI Marvin Roberson is a 46 y.o. male.  HPI  Patient presents after being found unresponsive by family members. EMS reports the patient had initial blood glucose of 40, and after he was provided supplemental sugar had glucose of 19 on arrival to the emergency department. The patient sleepy, awakens minimally, briefly, acknowledges where he is, who he is, denies pain. Patient reportedly has had about one week of GI like illness, with nausea, diarrhea. He denies abdominal pain. He had dialysis yesterday.    Past Medical History:  Diagnosis Date  . Hypertension   . Kidney failure     There are no active problems to display for this patient.   Past Surgical History:  Procedure Laterality Date  . arm surgery         Home Medications    Prior to Admission medications   Medication Sig Start Date End Date Taking? Authorizing Provider  aspirin EC 81 MG tablet Take 81 mg by mouth daily.    Historical Provider, MD  atorvastatin (LIPITOR) 40 MG tablet Take 40 mg by mouth daily.    Historical Provider, MD  carvedilol (COREG) 25 MG tablet Take 25 mg by mouth 2 (two) times daily with a meal.    Historical Provider, MD  hydrALAZINE (APRESOLINE) 100 MG tablet Take 100 mg by mouth 3 (three) times daily.    Historical Provider, MD  hydrocortisone (CORTEF) 10 MG tablet Take 5 mg by mouth daily.     Historical Provider, MD  losartan (COZAAR) 100 MG tablet Take 100 mg by mouth daily.    Historical Provider, MD  omeprazole (PRILOSEC) 40 MG capsule Take 40 mg by mouth daily.    Historical Provider, MD    Family History History reviewed. No pertinent family history.  Social History Social History  Substance Use Topics  . Smoking status: Current Every Day Smoker  . Smokeless tobacco: Never Used  . Alcohol  use No     Allergies   Betadine [povidone iodine]; Gabapentin; and Lisinopril   Review of Systems Review of Systems  Constitutional:       Per HPI, otherwise negative  HENT:       Per HPI, otherwise negative  Respiratory:       Per HPI, otherwise negative  Cardiovascular:       Per HPI, otherwise negative  Gastrointestinal: Positive for diarrhea and nausea.  Endocrine:       Negative aside from HPI  Genitourinary:       Neg aside from HPI   Musculoskeletal:       Per HPI, otherwise negative  Skin: Negative.   Allergic/Immunologic: Positive for immunocompromised state.  Neurological: Positive for weakness. Negative for syncope.     Physical Exam Updated Vital Signs BP 109/59 (BP Location: Right Arm)   Pulse 69   Temp 97.3 F (36.3 C) (Oral)   Resp 12   Ht 5\' 7"  (1.702 m)   Wt 135 lb (61.2 kg)   SpO2 95%   BMI 21.14 kg/m   Physical Exam  Constitutional: He is oriented to person, place, and time.  Ill appearing thin young M, oriented x3, but sleepy  HENT:  Head: Normocephalic and atraumatic.  Eyes: Conjunctivae and EOM are normal.  Cardiovascular: Normal rate and regular rhythm.  Pulmonary/Chest: Effort normal. No stridor. No respiratory distress.  R upper chest port, unremarkable  Abdominal: He exhibits no distension.  Musculoskeletal: He exhibits no edema.  Neurological: He is oriented to person, place, and time.  MAES, Ox3, sleepy but awakens quickly.  Skin: Skin is warm and dry.  Psychiatric: He has a normal mood and affect.  Nursing note and vitals reviewed.    ED Treatments / Results  Labs (all labs ordered are listed, but only abnormal results are displayed) Labs Reviewed  COMPREHENSIVE METABOLIC PANEL - Abnormal; Notable for the following:       Result Value   Sodium 129 (*)    Chloride 96 (*)    CO2 21 (*)    Glucose, Bld 284 (*)    BUN 37 (*)    Creatinine, Ser 11.31 (*)    Calcium 6.8 (*)    Albumin 2.5 (*)    AST 49 (*)    ALT 15  (*)    GFR calc non Af Amer 5 (*)    GFR calc Af Amer 6 (*)    All other components within normal limits  LIPASE, BLOOD - Abnormal; Notable for the following:    Lipase 78 (*)    All other components within normal limits  TROPONIN I - Abnormal; Notable for the following:    Troponin I 0.67 (*)    All other components within normal limits  CBC WITH DIFFERENTIAL/PLATELET - Abnormal; Notable for the following:    RBC 3.02 (*)    Hemoglobin 9.2 (*)    HCT 27.8 (*)    RDW 17.8 (*)    Platelets 67 (*)    All other components within normal limits  TROPONIN I - Abnormal; Notable for the following:    Troponin I 0.61 (*)    All other components within normal limits  CBG MONITORING, ED - Abnormal; Notable for the following:    Glucose-Capillary 170 (*)    All other components within normal limits  CBG MONITORING, ED - Abnormal; Notable for the following:    Glucose-Capillary 196 (*)    All other components within normal limits  CBG MONITORING, ED - Abnormal; Notable for the following:    Glucose-Capillary 19 (*)    All other components within normal limits  CBG MONITORING, ED - Abnormal; Notable for the following:    Glucose-Capillary 43 (*)    All other components within normal limits  CBG MONITORING, ED - Abnormal; Notable for the following:    Glucose-Capillary 125 (*)    All other components within normal limits  ETHANOL  CK  I-STAT CG4 LACTIC ACID, ED  CBG MONITORING, ED    EKG  EKG Interpretation  Date/Time:  Wednesday November 27 2016 09:28:05 EST Ventricular Rate:  71 PR Interval:    QRS Duration: 132 QT Interval:  523 QTC Calculation: 569 R Axis:   -26 Text Interpretation:  regular narrow complex qrs w substantial artefact Abnormal ekg Confirmed by Carmin Muskrat  MD ([id number]) on 11/27/2016 10:20:08 AM       Radiology Dg Chest 2 View  Result Date: 11/26/2016 CLINICAL DATA:  Initial evaluation for generalized weakness with chills and cough. EXAM: CHEST  2 VIEW  COMPARISON:  None available. FINDINGS: Mild cardiomegaly. Mediastinal silhouette within normal limits. Right-sided central venous catheter terminates at the right atrium. Lungs are clear. No focal infiltrates. No pulmonary edema or pleural effusion. No pneumothorax. No acute osseous abnormality. Surgical clips of vascular stent overlie the left  axillary region. IMPRESSION: No active cardiopulmonary disease identified. Electronically Signed   By: Jeannine Boga M.D.   On: 11/26/2016 02:55    Procedures Procedures (including critical care time)  Medications Ordered in ED Medications  dextrose 50 % solution (not administered)  sodium chloride 0.9 % bolus 1,000 mL (0 mLs Intravenous Stopped 11/27/16 1222)     Initial Impression / Assessment and Plan / ED Course  I have reviewed the triage vital signs and the nursing notes.  Pertinent labs & imaging results that were available during my care of the patient were reviewed by me and considered in my medical decision making (see chart for details).  Clinical Course as of Nov 27 1236  Wed Nov 27, 2016  1047 Patient sleepingInitial labs notable for elevated troponinGlucose has improved since arrival.  [RL]    Clinical Course User Index [RL] Carmin Muskrat, MD    12:38 PM Patient again hypoglycemic, glucose 43. Patient received an additional amp of D50, will receive food. Patient continues to deny any pain, including chest pain. Now he is much more awake, describes compliance with medication, denies a history of diabetes.  He states that he moved to our area one week ago from [place]. He has no primary care physician here.  With concern for persistent hypoglycemia in a nondiabetic patient with no anti-hyperglycemics, as well as with elevated troponin, though with no ongoing chest pain, the patient requires admission for further evaluation and management. There is some suspicion for medication-related episodes of  hypoglycemia.  Elevated troponin likely secondary to metabolic demand given his altered mental status, as well as the patient's known renal disease.  Repeat troponin slightly decreased from arrival Final Clinical Impressions(s) / ED Diagnoses  Hypoglycemia Elevated troponin Altered mental status  CRITICAL CARE Performed by: Carmin Muskrat Total critical care time: 35 minutes Critical care time was exclusive of separately billable procedures and treating other patients. Critical care was necessary to treat or prevent imminent or life-threatening deterioration. Critical care was time spent personally by me on the following activities: development of treatment plan with patient and/or surrogate as well as nursing, discussions with consultants, evaluation of patient's response to treatment, examination of patient, obtaining history from patient or surrogate, ordering and performing treatments and interventions, ordering and review of laboratory studies, ordering and review of radiographic studies, pulse oximetry and re-evaluation of patient's condition.     Carmin Muskrat, MD 11/27/16 1534

## 2016-11-27 NOTE — ED Notes (Signed)
Refusing u/s and orthostatic vital signs

## 2016-11-27 NOTE — H&P (Signed)
Date: 11/27/2016               Patient Name:  Marvin Roberson MRN: [id number]  DOB: 1970/12/09 Age / Sex: 46 y.o., male   PCP: No Pcp Per Patient         Medical Service: Internal Medicine Teaching Service         Attending Physician: Dr. Lucious Groves, DO    First Contact: Dr. Hetty Ely Pager: [phone]  Second Contact: Dr. Juleen China  Pager: [phone]       After Hours (After 5p/  First Contact Pager: [phone]  weekends / holidays): Second Contact Pager: [phone]   Chief Complaint: diarrhea   History of Present Illness: Marvin Roberson is a 46 y.o. male with a PMH of  ESRD (on T/Th/Sa HD), HTN, and stroke who presented to the ED yesterday (1/23) with subjective fever, chills, lightheartedness, headache, rhinorrhea/congestion, sore throat, chest pain with coughing and changes in position, productive cough, shortness of breath, abdominal pain, N/V/D and decreased oral intake for one week. He was found to have BP 99/54 and transient hypoxia while sleeping and abdominal tenderness on exam. He was discharged from the ED with strict return precautions.  He says that he developed diarrhea last Friday. He took immodium on Saturday and his diarrhea resolved until this morning when he had two episodes of watery bowel movements. He had two episodes of vomiting on Saturday. He has had decreased appetite and not been eating or drinking as much as usual since Friday but has continued to take medications, including antihypertensives. This morning he was feeling sweaty with blurry vision and nausea, he told his cousin that he wasn't feeling well and that he should call the ambulance, the next thing he remembers is waking up in the emergency room. He was sitting on the couch when he wasn't feeling well and does not think that he fell to the floor or hit his head. He has been around multiple sick children and did not receive this years influenza vaccine. He has a history of these episodes of low blood sugar when he doesn't  eat anything and experiences nausea, blurry vision, and diaphoresis then passes out.  When seen in the ED he said he was feeling cold, tires, and hungry. He denies chest pain at this time.  He had a stroke in 2016 which lead to decreased vision in his left eye. He denies hx of seizure or MI.  Upon arrival his SBP was 70. Upon arrival to the ED his blood glucose was 19, he was given an amp of D50 and bg improved to 280 on BMP at 9:30a then dropped to 43 on poc by 12:30p.  On presentation he was found to be hypoxic 78-80% on room air which improved to 90-93% on nasal canula.   Meds:  No outpatient prescriptions have been marked as taking for the 11/27/16 encounter [employer] Encounter).     Allergies: Allergies as of 11/27/2016 - Review Complete 11/27/2016  Allergen Reaction Noted  . Betadine [povidone iodine]  11/25/2016  . Gabapentin Other (See Comments) 11/25/2016  . Lisinopril Other (See Comments) 11/25/2016   Past Medical History:  Diagnosis Date  . Hypertension   . Kidney failure    Family History: History reviewed. No pertinent family history.  Social History:  Denies alcohol use. Smokes 2-3 cigarettes per day. Denies illicit drug use. Reports that he moved to [place] from [place] 1 week ago.   Review of Systems: A  complete ROS was negative except as per HPI.   Physical Exam: Blood pressure (!) 94/48, pulse 74, temperature 97.3 F (36.3 C), temperature source Oral, resp. rate 17, height 5\' 7"  (1.702 m), weight 135 lb (61.2 kg), SpO2 100 %. Physical Exam  Constitutional: He is oriented to person, place, and time. He appears well-developed and well-nourished. No distress.  HENT:  Head: Normocephalic and atraumatic.  Cardiovascular: Normal rate and regular rhythm.   No murmur heard. Right chest - catheter with no signs of infection, dressings clean dry and intact  No peripheral edema   Pulmonary/Chest:  defered exam   Abdominal: Soft. He exhibits no distension. There  is no tenderness. There is no guarding.  Neurological: He is alert and oriented to person, place, and time.  Skin: Skin is warm and dry. He is not diaphoretic.   LABS BMP Na 129 (corrected 132 for hyperglycemia), K 3.8, Cl 96, CO2 21, BUN 37, Crt 11.31, Gap 12 CBC WBC 5.2, Hgb 9.2, MCV 92, RDW 17, PLT 67  Lactic acid 0.96 Trop 0.67 > 0.61  Ethanol <5   Assessment & Plan by Problem:    Hypoglycemia   Flu-like symptoms   Nausea & vomiting   Diarrhea 46 year old man with PMH ESRD, stroke, and HTN. Review of [facility] records shows that he has had episodes of hypoglycemia and been evaluated by endocrinology who obtained cortisol level and found that it was high  so they were able to exclude addisonian crisis. In 2016 he was found to have a nodular liver contour on CT at [facility] and decreased liver function testing so decreased gluconeogenesis is a consideration. 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 Regardless he has been on hydrocortisone for an extended period of time and may have developed adrenal insufficiency. Low glucose may be related to poor po intake in the setting of viral illness.   -Dexamethasone 4 mg  -Continue hydrocortisone 5 mg  -follow up orthostatic vitals  -f/u flu PCR  -f/u am cortisol, ACTH  -follow up PT and abdominal ultrasound  -continue CBG monitoring and renal diet   Hypotension  Improving after 1500 cc bolus in the ED. Secondary to decreased PO intake with continues anti htn use.  -continue drip   Elevated troponin  Denies chest pain or muscle pain at this time but troponin was elevated on admission.  - trend troponin  -follow up CK  -follow up repeat EKG and admit to telemetry     ESRD (end stage renal disease) ([place]) States this is secondary to uncontrolled HTN and he has been on  dialysis for the past 20 years. T/Th/Sa HD. Reports that he went to dialysis yesterday.  -will consult nephrology, he will need dialysis if he stays overnight tomorrow      Hypertension Home meds include hydralazine 100 mg TID, losartan 100 mg qd, carvedilol 25 mg BID.   Hx of stroke  Continue home meds ASA and atorvostatin 40 mg qd.   DVT Ppx heparin Code Status FULL   Dispo: Admit patient to Inpatient with expected length of stay greater than 2 midnights.  Signed: Ledell Noss, MD 11/27/2016, 3:28 PM  Pager: [phone]

## 2016-11-27 NOTE — ED Notes (Signed)
Called for meal tray.

## 2016-11-28 DIAGNOSIS — R778 Other specified abnormalities of plasma proteins: Secondary | ICD-10-CM | POA: Diagnosis not present

## 2016-11-28 DIAGNOSIS — Z7982 Long term (current) use of aspirin: Secondary | ICD-10-CM

## 2016-11-28 DIAGNOSIS — E2740 Unspecified adrenocortical insufficiency: Secondary | ICD-10-CM

## 2016-11-28 DIAGNOSIS — I69398 Other sequelae of cerebral infarction: Secondary | ICD-10-CM | POA: Diagnosis not present

## 2016-11-28 DIAGNOSIS — I776 Arteritis, unspecified: Secondary | ICD-10-CM | POA: Diagnosis not present

## 2016-11-28 DIAGNOSIS — H538 Other visual disturbances: Secondary | ICD-10-CM

## 2016-11-28 DIAGNOSIS — I959 Hypotension, unspecified: Secondary | ICD-10-CM | POA: Diagnosis not present

## 2016-11-28 DIAGNOSIS — R0902 Hypoxemia: Secondary | ICD-10-CM | POA: Diagnosis not present

## 2016-11-28 DIAGNOSIS — F17210 Nicotine dependence, cigarettes, uncomplicated: Secondary | ICD-10-CM

## 2016-11-28 DIAGNOSIS — Z7952 Long term (current) use of systemic steroids: Secondary | ICD-10-CM | POA: Diagnosis not present

## 2016-11-28 DIAGNOSIS — E162 Hypoglycemia, unspecified: Secondary | ICD-10-CM | POA: Diagnosis not present

## 2016-11-28 DIAGNOSIS — Z992 Dependence on renal dialysis: Secondary | ICD-10-CM | POA: Diagnosis not present

## 2016-11-28 DIAGNOSIS — Z79899 Other long term (current) drug therapy: Secondary | ICD-10-CM

## 2016-11-28 DIAGNOSIS — N186 End stage renal disease: Secondary | ICD-10-CM | POA: Diagnosis not present

## 2016-11-28 DIAGNOSIS — J101 Influenza due to other identified influenza virus with other respiratory manifestations: Secondary | ICD-10-CM | POA: Diagnosis not present

## 2016-11-28 DIAGNOSIS — Z8673 Personal history of transient ischemic attack (TIA), and cerebral infarction without residual deficits: Secondary | ICD-10-CM | POA: Diagnosis not present

## 2016-11-28 DIAGNOSIS — D472 Monoclonal gammopathy: Secondary | ICD-10-CM | POA: Diagnosis not present

## 2016-11-28 DIAGNOSIS — I120 Hypertensive chronic kidney disease with stage 5 chronic kidney disease or end stage renal disease: Secondary | ICD-10-CM | POA: Diagnosis not present

## 2016-11-28 DIAGNOSIS — J09X2 Influenza due to identified novel influenza A virus with other respiratory manifestations: Secondary | ICD-10-CM | POA: Diagnosis not present

## 2016-11-28 LAB — CBC
HCT: 27.7 % — ABNORMAL LOW (ref 39.0–52.0)
Hemoglobin: 9.1 g/dL — ABNORMAL LOW (ref 13.0–17.0)
MCH: 29.8 pg (ref 26.0–34.0)
MCHC: 32.9 g/dL (ref 30.0–36.0)
MCV: 90.8 fL (ref 78.0–100.0)
PLATELETS: 68 10*3/uL — AB (ref 150–400)
RBC: 3.05 MIL/uL — AB (ref 4.22–5.81)
RDW: 17.7 % — AB (ref 11.5–15.5)
WBC: 3 10*3/uL — AB (ref 4.0–10.5)

## 2016-11-28 LAB — GLUCOSE, CAPILLARY
GLUCOSE-CAPILLARY: 146 mg/dL — AB (ref 65–99)
GLUCOSE-CAPILLARY: 173 mg/dL — AB (ref 65–99)
GLUCOSE-CAPILLARY: 204 mg/dL — AB (ref 65–99)
Glucose-Capillary: 128 mg/dL — ABNORMAL HIGH (ref 65–99)
Glucose-Capillary: 149 mg/dL — ABNORMAL HIGH (ref 65–99)
Glucose-Capillary: 162 mg/dL — ABNORMAL HIGH (ref 65–99)

## 2016-11-28 LAB — TROPONIN I: Troponin I: 0.32 ng/mL (ref <0.03)

## 2016-11-28 LAB — BASIC METABOLIC PANEL
Anion gap: 15 (ref 5–15)
BUN: 47 mg/dL — AB (ref 6–20)
CHLORIDE: 100 mmol/L — AB (ref 101–111)
CO2: 17 mmol/L — ABNORMAL LOW (ref 22–32)
CREATININE: 12.76 mg/dL — AB (ref 0.61–1.24)
Calcium: 6.5 mg/dL — ABNORMAL LOW (ref 8.9–10.3)
GFR, EST AFRICAN AMERICAN: 5 mL/min — AB (ref >60)
GFR, EST NON AFRICAN AMERICAN: 4 mL/min — AB (ref >60)
Glucose, Bld: 135 mg/dL — ABNORMAL HIGH (ref 65–99)
POTASSIUM: 4.3 mmol/L (ref 3.5–5.1)
SODIUM: 132 mmol/L — AB (ref 135–145)

## 2016-11-28 LAB — HIV ANTIBODY (ROUTINE TESTING W REFLEX): HIV SCREEN 4TH GENERATION: NONREACTIVE

## 2016-11-28 LAB — CORTISOL-AM, BLOOD: CORTISOL - AM: 3.3 ug/dL — AB (ref 6.7–22.6)

## 2016-11-28 LAB — HEPATITIS B SURFACE ANTIGEN: Hepatitis B Surface Ag: NEGATIVE

## 2016-11-28 MED ORDER — HYDROCORTISONE 5 MG PO TABS: ORAL_TABLET | ORAL | 0 refills | Status: DC

## 2016-11-28 MED ORDER — HYDROCORTISONE 5 MG PO TABS: 5.0000 mg | ORAL_TABLET | Freq: Every evening | ORAL | 0 refills | Status: DC

## 2016-11-28 MED ORDER — HYDROCORTISONE 5 MG PO TABS
5.0000 mg | ORAL_TABLET | Freq: Every evening | ORAL | Status: DC
  Filled 2016-11-28: qty 1

## 2016-11-28 MED ORDER — OSELTAMIVIR PHOSPHATE 30 MG PO CAPS
30.0000 mg | ORAL_CAPSULE | ORAL | 0 refills | Status: DC
Start: 2016-11-30 — End: 2016-11-29

## 2016-11-28 MED ORDER — HYDROCORTISONE 5 MG PO TABS
15.0000 mg | ORAL_TABLET | Freq: Every day | ORAL | Status: DC
  Filled 2016-11-28: qty 1

## 2016-11-28 MED ORDER — OSELTAMIVIR PHOSPHATE 30 MG PO CAPS
30.0000 mg | ORAL_CAPSULE | ORAL | Status: DC
  Administered 2016-11-28: 30 mg via ORAL
  Filled 2016-11-28: qty 1

## 2016-11-28 MED ORDER — HYDROCORTISONE 10 MG PO TABS
10.0000 mg | ORAL_TABLET | Freq: Every day | ORAL | Status: DC
  Administered 2016-11-28: 10 mg via ORAL
  Filled 2016-11-28: qty 1

## 2016-11-28 MED ORDER — SODIUM CHLORIDE 0.9 % IV SOLN: 62.5000 mg | INTRAVENOUS | Status: DC

## 2016-11-28 MED ORDER — HYDROCORTISONE 5 MG PO TABS
5.0000 mg | ORAL_TABLET | Freq: Once | ORAL | Status: DC
  Filled 2016-11-28: qty 1

## 2016-11-28 MED ORDER — OSELTAMIVIR PHOSPHATE 30 MG PO CAPS
30.0000 mg | ORAL_CAPSULE | Freq: Once | ORAL | Status: AC
Start: 2016-11-28 — End: 2016-11-28
  Administered 2016-11-28: 30 mg via ORAL
  Filled 2016-11-28: qty 1

## 2016-11-28 MED ORDER — NEPRO/CARBSTEADY PO LIQD: 237.0000 mL | Freq: Three times a day (TID) | ORAL | Status: DC

## 2016-11-28 NOTE — Care Management Note (Addendum)
Case Management Note  Patient Details  Name: Yadir Zentner MRN: [id number] Date of Birth: 1971-09-19  Subjective/Objective:              Patient states he has HD on [employer] TTS chair time is 12:00. States [facility] uses [facility], [address] is closest to where he stays. He states he will take a cab home, declined bus pass. He states his medicaid is active in [place], and he knows to go to DSS and understands how to get it switched to [place].       Action/Plan:  CM will place contact info for [facility] on AVS, patient understands to call Monday to schedule PCP, no appointments available at this time.   Expected Discharge Date:                  Expected Discharge Plan:  Home/Self Care  In-House Referral:     Discharge planning Services  CM Consult  Post Acute Care Choice:    Choice offered to:     DME Arranged:    DME Agency:     HH Arranged:    [place] Agency:     Status of Service:  Completed, signed off  If discussed at [employer] of Stay Meetings, dates discussed:    Additional Comments:  Carles Collet, RN 11/28/2016, 1:33 PM

## 2016-11-28 NOTE — Progress Notes (Signed)
  Subjective: Marvin Roberson continues to feel chills today but has had no further diarrhea or vomiting since the time of admission. He does not like the renal diet that he has been served overnight but says that he will eat something if he is provided with a regular diet.    Objective:  Vital signs in last 24 hours: Vitals:   11/27/16 2240 11/28/16 0030 11/28/16 0424 11/28/16 0500  BP: (!) 106/52 130/80 128/67   Pulse: 72 69 64   Resp: 18 18 18    Temp: 98.7 F (37.1 C)     TempSrc: Oral     SpO2: 91%  98%   Weight:    121 lb 4.1 oz (55 kg)  Height:       Physical Exam  Constitutional: He is oriented to person, place, and time. He appears well-developed and well-nourished. No distress.  Cardiovascular: Normal rate and regular rhythm.   No murmur heard. No peripheral edema   Pulmonary/Chest: Effort normal. No respiratory distress. He has no wheezes. He has no rales. He exhibits tenderness.  Abdominal: Soft. Bowel sounds are normal. He exhibits no distension. There is no tenderness.  Neurological: He is alert and oriented to person, place, and time.  Skin: Skin is warm and dry. He is not diaphoretic.   Assessment/Plan: Pt is a 46 y.o. yo male with a PMHx of ESRD (on T/Th/Sa HD), HTN, and stroke  who was admitted on 11/27/2016 after an episode of blurred vision, sweatiness and nausea, which was determined to be secondary to hypoglycemia and adrenal insufficiency.     Hypoglycemia   Nausea & vomiting   Diarrhea   Flu-like symptoms Symptoms likely multifactorial and related to influenza and precipitated adrenal insufficiency. Hypotension and hypoglycemic have resolved. Will continue tamiflu and stress dose steroids. There is inconsistency in the record of his hydrocortisone dose, in one note in march he was on hydrocortisone 15 mg in AM and 5 mg in the evening but in a note in following months he was noted to be on hydrocortisone 5 mg once daily without mention of a change.  -follow up  A1c  -Starting hydrocortisone 15 mg qAM and 5 mg qPM. Instructed him that he will need to take 2-3 times this dose in the setting of illness.   Elevated troponin  Has chest wall tenderness today, Troponin peaked at 0.97 then trended down. Chest pain in the setting of e refused this morning EKG.  -follow up CK  -follow up repeat EKG and admit to telemetry     ESRD (end stage renal disease) ([place]) On T/Th/Sa scheduled HD. Will have HD today. No hyperkalemia on BMP today.     Hypertension Normotensive at this time which is improved from hypotension at presentation. Home meds include hydralazine 100 mg TID, losartan 100 mg qd, carvedilol 25 mg BID. Will continue to hold these but they can be restarted if he becomes hypertensive.   Hx of stroke  Continue home meds ASA and atorvostatin 40 mg qd.   Dispo: Anticipated discharge today after dialysis    Ledell Noss, MD 11/28/2016, 10:07 AM Pager: [phone]

## 2016-11-28 NOTE — Care Management Obs Status (Signed)
[place] NOTIFICATION   Patient Details  Name: Marvin Roberson MRN: [id number] Date of Birth: 1971-03-06   Medicare Observation Status Notification Given:  Yes    Carles Collet, RN 11/28/2016, 9:35 AM

## 2016-11-28 NOTE — H&P (Signed)
Internal Medicine Attending Admission Note  I saw and evaluated the patient. I reviewed the resident's note and I agree with the resident's findings and plan as documented in the resident's note.  Assessment & Plan by Problem:   Hypotension and Hypoglycemia likely secondary to Adrenal Crisis in the setting of Influenza A infection/ possibly subtherapeutic glucocorticoid dosing - He was treated appropriately with IVF and Dexamethasone which allowed for confirmatory testing this morning that revealed low AM cortisol.  His blood sugars and blood glucose are now stabilized and we will place him on a more stable dose of hydrocortisone.  His hypoglycemia was never fully confirmed with plasma glucose testing. - Unfortunately he has had very fragmented medical care between [place] and [place] and even within [place].  Review of his records from Care everywhere shows that he presented with hypoglycemia back in September of 2016, adrenal crisis was entertained and he was started on IV hydrocortisone, a follow up AM cortisol level was of course elevated to 156 (in the setting of high dose IV hydrocortisone.  He was ultimately evaluated by endocrinology who could not determine why he developed AI in the first place or was previously placed on steroids but recommended he be titrated back down, he was ultimately discharged on Hydrocortisone 15mg  in AM and 5mg  in PM (given 2 separate scripts it appears in the EMR), they felt Adrenal crisis at that time was less likely given he presented with HYPERtension.  This apparently continued thru at least March of 2017 when he was evaluated at [facility] for a separate issue.  However by April of 2017 his chart appears to have dropped the AM dose of hydrocortisone and reveals only the PM dosing.  This eventually turned into 5mg  of hydrocortisone daily. - I would at least start him back on 15mg  of Hydrocortisone in AM and 5 in PM, with instructions to take 2-3 times this dose for 3 days in the  setting of illness. - Continue renal dose Tamiflu    ESRD (end stage renal disease) [facility]) - Nephrology consult    Nausea & vomiting, Diarrhea - May be secondary to adrenal insufficiency verus Influenza, treating both as above    Hypertension - Really needs to get plugged in with more stable local care, will need better titration of his steroids and BP medications.  Chief Complaint(s):diarrhea  History - key components related to admission:Briefly Marvin Roberson is a 46 yo male with an extensive PMH of ESRD, sCHF( EF 35-40% 03/14/15>> improved to 60-65% 01/24/16), HTN, stroke, MGUS, Aortitis, Pseudoaneurysm, Adrenal Insufficiency, Nodular contour of liver (found of CT 07/2015), Pulmonary nodule (77mm Left apical 07/10/15) who presented with fever, chills, dizziness, headache, nasal congestion, N/V, and Diarrhea for the last week.  He has had contact with people with flu like illness. Notes diarrhea is non bloody, improved with imodium.  He felt acutely worse yesterday and asked family member to call EMS, on their arrival he was noted to be hypoglycemic.  He was treated for his hypoglycemia however on arrival to the ED he was again found to be hypoglycemic (both via fingerstick glucose) and hypotensive with SBP of 70.  He was given IVF, dextrose and he symptomatically improved.  We also treated him with 4mg  of IV dexamethasone and admitted him.  His hypoglycemia and hypotensive have fully resolved.  He tested positive for influenza A.  Lab results: Reviewed in Epic  Physical Exam - key components related to admission: General: resting in bed, disinterested and watching  TV HEENT: EOMI, no scleral icterus Cardiac: RRR, no rubs, murmurs or gallops Pulm: clear to auscultation bilaterally, moving normal volumes of air Abd: soft, nontender, nondistended, BS present, no appreciated organomegaly Ext: warm and well perfused, no pedal edema Neuro: alert and oriented X4  Vitals:   11/27/16 2240 11/28/16 0030  11/28/16 0424 11/28/16 0500  BP: (!) 106/52 130/80 128/67   Pulse: 72 69 64   Resp: 18 18 18    Temp: 98.7 F (37.1 C)     TempSrc: Oral     SpO2: 91%  98%   Weight:    121 lb 4.1 oz (55 kg)  Height:

## 2016-11-28 NOTE — Care Management CC44 (Signed)
Condition Code 44 Documentation Completed  Patient Details  Name: Labarron Durnin MRN: [id number] Date of Birth: August 18, 1971   Condition Code 44 given:  Yes Patient signature on Condition Code 44 notice:  Yes Documentation of 2 MD's agreement:  Yes Code 44 added to claim:  Yes    Carles Collet, RN 11/28/2016, 9:35 AM

## 2016-11-28 NOTE — Progress Notes (Signed)
Pt complaining of generalized pain with worsening pain in left arm around old fistula site that he reports has been dead for years and I do not feel any thrill or bruit. Requests morphine.Feels feverish and I gave him tylenol. Blood sugar in the 50s and he has been drinking orange juice and it came up to 150. BP remains low in 90s to low 340B systolic.I started continuous fluids see MAR. Refuses to eat the sandwich we brought him because he says hospital food doesn't appeal to him. He says he has no friends who can bring him outside food until tomorrow.Requests MD to see him. Internal Med MD Alphonzo Grieve and a colleague came to bedside. Tramadol, simethicone, and Tums ordered see MAR for pain, gas, and stomach upset. Pt refused tramadol saying it doesn't do anything for him. He became diaphoretic shortly after MD's left and said he did not feel good. Denies chest pain or abdominal pain. Recheck sugar, sats, Bps and temp were within normal limits. Rapid Response RN Magda Paganini notified to keep pt on her radar. She said this might be the pt's fever breaking. MD Svalina said to call if declines We wiped him off with rags and changed his damp sheets and watched him closely. Half an hour later he looked much better and stopped sweating. Sugars remains stable in 150s and other VSS. Will continue to monitor.

## 2016-11-28 NOTE — Progress Notes (Signed)
Pt refused second attempt to do EKG and refused first attempt at giving tamiflu, he said he would take it "later" so I rescheduled it for 0730 on dayshift see MAR

## 2016-11-28 NOTE — Progress Notes (Signed)
Patient complaining of 8/10 pain. On call Internal med MD paged. Directed to give patient one time dose of Tramadol. Patient refused med and refused Tylenol. States the meds do not help the pain and would rather not take anything.

## 2016-11-28 NOTE — Progress Notes (Signed)
Went to do EKG and pt said he would prefer we do it at 0600 because he wants to sleep now. Will retry later.

## 2016-11-28 NOTE — Progress Notes (Addendum)
Pt arrived at the unit from dialysis at 1900. Discussed with charge nurse if we can proceed with discharge, she stated we could move it to tomorrow, because it was late.

## 2016-11-28 NOTE — Consult Note (Signed)
[facility] Renal Consultation Note  Indication for Consultation:  Management of ESRD/hemodialysis; anemia, hypertension/volume and secondary hyperparathyroidism  HPI: Marvin Roberson is a 46 y.o. male ESRD 2/2 HTN first HD 2001 in [place]/ recently moved back [facility] 11/21/16  [facility] TTS ) admitted with Hypotension ( reportedlywas taking home meds  With N/V/D), Hypoglycemia , Influenzae A ,Elevated troponin  (0.32). His last hd was on schedule 11/26/16 using perm cath ( failed upper bilat arm access with  Tender seroma/hematoma or thrombosed aneurysm R Arm antecubital  Area and Yesterday had op refer to [facility] for evaluation by Dr. Marval Regal  Seen at [facility] stable then ) we are asked to see for HD/ ESRD Issues. Currently noted Obs statis .       Past Medical History:  Diagnosis Date  . ESRD on dialysis [facility]) since 1990s   "TTS; [address]" (11/27/2016)  . GERD (gastroesophageal reflux disease)   . High cholesterol   . Hypertension   . Kidney failure   . Stroke [facility]) 2016   decreased vision in his left eye/notes 11/27/2016    Past Surgical History:  Procedure Laterality Date  . arm surgery Left 2016   "for aneurysm"  . INSERTION OF DIALYSIS CATHETER Right    chest  . THYROIDECTOMY       History reviewed. No pertinent family history.    reports that he has been smoking Cigarettes.  He has a 2.40 pack-year smoking history. He has never used smokeless tobacco. He reports that he does not drink alcohol or use drugs. [employer] prior to transfer to [facility] [place] before transfer to [place] 11/21/16/ Moved back to [place] to live with "cousins Audiological scientist .ho [facility] .    Allergies  Allergen Reactions  . Betadine [Povidone Iodine]   . Gabapentin Other (See Comments)    Put him In hospital  . Lisinopril Other (See Comments)    angioedema    Prior to Admission medications   Medication Sig Start Date End Date Taking? Authorizing Provider  aspirin EC 81 MG tablet Take 81 mg  by mouth daily.    Historical Provider, MD  atorvastatin (LIPITOR) 40 MG tablet Take 40 mg by mouth daily.    Historical Provider, MD  carvedilol (COREG) 25 MG tablet Take 25 mg by mouth 2 (two) times daily with a meal.    Historical Provider, MD  hydrALAZINE (APRESOLINE) 100 MG tablet Take 100 mg by mouth 3 (three) times daily.    Historical Provider, MD  hydrocortisone (CORTEF) 10 MG tablet Take 5 mg by mouth daily.     Historical Provider, MD  losartan (COZAAR) 100 MG tablet Take 100 mg by mouth daily.    Historical Provider, MD  omeprazole (PRILOSEC) 40 MG capsule Take 40 mg by mouth daily.    Historical Provider, MD      Results for orders placed or performed during the hospital encounter of 11/27/16 (from the past 48 hour(s))  CBG monitoring, ED     Status: Abnormal   Collection Time: 11/27/16  9:12 AM  Result Value Ref Range   Glucose-Capillary 19 (LL) 65 - 99 mg/dL   Comment 1 Notify RN    Comment 2 Document in Chart   Ethanol     Status: None   Collection Time: 11/27/16  9:19 AM  Result Value Ref Range   Alcohol, Ethyl (B) <5 <5 mg/dL    Comment:        LOWEST DETECTABLE LIMIT FOR  SERUM ALCOHOL IS 5 mg/dL FOR MEDICAL PURPOSES ONLY   CBG monitoring, ED     Status: None   Collection Time: 11/27/16  9:20 AM  Result Value Ref Range   Glucose-Capillary 94 65 - 99 mg/dL  Comprehensive metabolic panel     Status: Abnormal   Collection Time: 11/27/16  9:32 AM  Result Value Ref Range   Sodium 129 (L) 135 - 145 mmol/L   Potassium 3.8 3.5 - 5.1 mmol/L   Chloride 96 (L) 101 - 111 mmol/L   CO2 21 (L) 22 - 32 mmol/L   Glucose, Bld 284 (H) 65 - 99 mg/dL   BUN 37 (H) 6 - 20 mg/dL   Creatinine, Ser 11.31 (H) 0.61 - 1.24 mg/dL   Calcium 6.8 (L) 8.9 - 10.3 mg/dL   Total Protein 7.2 6.5 - 8.1 g/dL   Albumin 2.5 (L) 3.5 - 5.0 g/dL   AST 49 (H) 15 - 41 U/L   ALT 15 (L) 17 - 63 U/L   Alkaline Phosphatase 79 38 - 126 U/L   Total Bilirubin 0.8 0.3 - 1.2 mg/dL   GFR calc non Af Amer 5  (L) >60 mL/min   GFR calc Af Amer 6 (L) >60 mL/min    Comment: (NOTE) The eGFR has been calculated using the CKD EPI equation. This calculation has not been validated in all clinical situations. eGFR's persistently <60 mL/min signify possible Chronic Kidney Disease.    Anion gap 12 5 - 15  Lipase, blood     Status: Abnormal   Collection Time: 11/27/16  9:32 AM  Result Value Ref Range   Lipase 78 (H) 11 - 51 U/L  Troponin I     Status: Abnormal   Collection Time: 11/27/16  9:32 AM  Result Value Ref Range   Troponin I 0.67 (HH) <0.03 ng/mL    Comment: CRITICAL RESULT CALLED TO, READ BACK BY AND VERIFIED WITH: ALISHA BABB,RN AT 1042 11/27/16 BY ZBEECH.   CBC with Differential     Status: Abnormal   Collection Time: 11/27/16  9:32 AM  Result Value Ref Range   WBC 5.2 4.0 - 10.5 K/uL   RBC 3.02 (L) 4.22 - 5.81 MIL/uL   Hemoglobin 9.2 (L) 13.0 - 17.0 g/dL   HCT 27.8 (L) 39.0 - 52.0 %   MCV 92.1 78.0 - 100.0 fL   MCH 30.5 26.0 - 34.0 pg   MCHC 33.1 30.0 - 36.0 g/dL   RDW 17.8 (H) 11.5 - 15.5 %   Platelets 67 (L) 150 - 400 K/uL    Comment: REPEATED TO VERIFY PLATELET COUNT CONFIRMED BY SMEAR    Neutrophils Relative % 77 %   Neutro Abs 4.0 1.7 - 7.7 K/uL   Lymphocytes Relative 15 %   Lymphs Abs 0.8 0.7 - 4.0 K/uL   Monocytes Relative 6 %   Monocytes Absolute 0.3 0.1 - 1.0 K/uL   Eosinophils Relative 2 %   Eosinophils Absolute 0.1 0.0 - 0.7 K/uL   Basophils Relative 0 %   Basophils Absolute 0.0 0.0 - 0.1 K/uL  CBG monitoring, ED     Status: Abnormal   Collection Time: 11/27/16  9:35 AM  Result Value Ref Range   Glucose-Capillary 170 (H) 65 - 99 mg/dL  I-Stat CG4 Lactic Acid, ED     Status: None   Collection Time: 11/27/16  9:45 AM  Result Value Ref Range   Lactic Acid, Venous 0.96 0.5 - 1.9 mmol/L  CBG monitoring, ED  Status: Abnormal   Collection Time: 11/27/16 10:10 AM  Result Value Ref Range   Glucose-Capillary 196 (H) 65 - 99 mg/dL  POC CBG, ED     Status:  Abnormal   Collection Time: 11/27/16 12:26 PM  Result Value Ref Range   Glucose-Capillary 43 (LL) 65 - 99 mg/dL  Troponin I     Status: Abnormal   Collection Time: 11/27/16 12:27 PM  Result Value Ref Range   Troponin I 0.61 (HH) <0.03 ng/mL    Comment: CRITICAL VALUE NOTED.  VALUE IS CONSISTENT WITH PREVIOUSLY REPORTED AND CALLED VALUE.  CBG monitoring, ED     Status: Abnormal   Collection Time: 11/27/16 12:48 PM  Result Value Ref Range   Glucose-Capillary 125 (H) 65 - 99 mg/dL  CK     Status: Abnormal   Collection Time: 11/27/16  4:31 PM  Result Value Ref Range   Total CK 561 (H) 49 - 397 U/L  Protime-INR     Status: Abnormal   Collection Time: 11/27/16  4:31 PM  Result Value Ref Range   Prothrombin Time 15.5 (H) 11.4 - 15.2 seconds   INR 1.22   CBG monitoring, ED     Status: None   Collection Time: 11/27/16  5:40 PM  Result Value Ref Range   Glucose-Capillary 83 65 - 99 mg/dL  Influenza panel by PCR (type A & B)     Status: Abnormal   Collection Time: 11/27/16  6:30 PM  Result Value Ref Range   Influenza A By PCR POSITIVE (A) NEGATIVE   Influenza B By PCR NEGATIVE NEGATIVE    Comment: (NOTE) The Xpert Xpress Flu assay is intended as an aid in the diagnosis of  influenza and should not be used as a sole basis for treatment.  This  assay is FDA approved for nasopharyngeal swab specimens only. Nasal  washings and aspirates are unacceptable for Xpert Xpress Flu testing.   Troponin I (q 6hr x 3)     Status: Abnormal   Collection Time: 11/27/16  7:52 PM  Result Value Ref Range   Troponin I 0.55 (HH) <0.03 ng/mL    Comment: CRITICAL VALUE NOTED.  VALUE IS CONSISTENT WITH PREVIOUSLY REPORTED AND CALLED VALUE.  Glucose, capillary     Status: Abnormal   Collection Time: 11/27/16  8:10 PM  Result Value Ref Range   Glucose-Capillary 57 (L) 65 - 99 mg/dL  Glucose, capillary     Status: Abnormal   Collection Time: 11/27/16 10:19 PM  Result Value Ref Range   Glucose-Capillary 151  (H) 65 - 99 mg/dL  Glucose, capillary     Status: Abnormal   Collection Time: 11/28/16 12:13 AM  Result Value Ref Range   Glucose-Capillary 149 (H) 65 - 99 mg/dL  Glucose, capillary     Status: Abnormal   Collection Time: 11/28/16  4:21 AM  Result Value Ref Range   Glucose-Capillary 146 (H) 65 - 99 mg/dL  Basic metabolic panel     Status: Abnormal   Collection Time: 11/28/16  6:07 AM  Result Value Ref Range   Sodium 132 (L) 135 - 145 mmol/L   Potassium 4.3 3.5 - 5.1 mmol/L   Chloride 100 (L) 101 - 111 mmol/L   CO2 17 (L) 22 - 32 mmol/L   Glucose, Bld 135 (H) 65 - 99 mg/dL   BUN 47 (H) 6 - 20 mg/dL   Creatinine, Ser 12.76 (H) 0.61 - 1.24 mg/dL   Calcium 6.5 (L) 8.9 -  10.3 mg/dL   GFR calc non Af Amer 4 (L) >60 mL/min   GFR calc Af Amer 5 (L) >60 mL/min    Comment: (NOTE) The eGFR has been calculated using the CKD EPI equation. This calculation has not been validated in all clinical situations. eGFR's persistently <60 mL/min signify possible Chronic Kidney Disease.    Anion gap 15 5 - 15  CBC     Status: Abnormal   Collection Time: 11/28/16  6:07 AM  Result Value Ref Range   WBC 3.0 (L) 4.0 - 10.5 K/uL   RBC 3.05 (L) 4.22 - 5.81 MIL/uL   Hemoglobin 9.1 (L) 13.0 - 17.0 g/dL    Comment: CONSISTENT WITH PREVIOUS RESULT   HCT 27.7 (L) 39.0 - 52.0 %   MCV 90.8 78.0 - 100.0 fL   MCH 29.8 26.0 - 34.0 pg   MCHC 32.9 30.0 - 36.0 g/dL   RDW 17.7 (H) 11.5 - 15.5 %   Platelets 68 (L) 150 - 400 K/uL    Comment: CONSISTENT WITH PREVIOUS RESULT  Troponin I (q 6hr x 3)     Status: Abnormal   Collection Time: 11/28/16  6:07 AM  Result Value Ref Range   Troponin I 0.32 (HH) <0.03 ng/mL    Comment: CRITICAL VALUE NOTED.  VALUE IS CONSISTENT WITH PREVIOUSLY REPORTED AND CALLED VALUE.  Cortisol-am, blood     Status: Abnormal   Collection Time: 11/28/16  6:07 AM  Result Value Ref Range   Cortisol - AM 3.3 (L) 6.7 - 22.6 ug/dL  Glucose, capillary     Status: Abnormal   Collection Time:  11/28/16  7:54 AM  Result Value Ref Range   Glucose-Capillary 128 (H) 65 - 99 mg/dL    ROS: see hpi  Physical Exam: Vitals:   11/28/16 0030 11/28/16 0424  BP: 130/80 128/67  Pulse: 69 64  Resp: 18 18  Temp:       General: alert thin AAm NAD OX3 chronically  ill appearing  HEENT: [place] EOMI MMM Neck: supple Heart: RRR no mur , rub or gal Lungs: CTA  Abdomen: Soft , Nt, Nd Extremities: no pedal edema  Skin: no overt rash dry  Neuro:alert oX3 / L upper extrem weakness  Dialysis Access: RIJ perm cath NT/ no dc   Dialysis Orders: Center: [facility]  on TTS . EDW 61  HD Bath 2k,2.25 ca  Time 3.5 hr Heparin none. Access R IJ Perm cath          Venofer 65m weekly   Other op labs  hgb 11.1 (11-21-16)  pth 201   Assessment/Plan  1. ESRD -  HD TTS schedule  2. Influenza A-  With Viral syndrome - per admit  3. Hypertension/volume  - HYPOTENSION on admit  Sec to BP meds taken at home with vomiting and diarrhea / vol depletion / No uf with hd today /Bp now stable 128/67  4. Anemia  - no esa as op / fu hgb trend / weekly iron on hd  5. Metabolic bone disease -  No vit  Or binder last phos op noted 2.8  Fu labs  6. R arm seroma/hematoma or thrombosed aneurysm  At OLD avf site - appears stable [facility] op apt fu made yest .  7. HO cva - per admit   DErnest Haber PA-C CSanta [id number]/25/2018, 9:53 AM   Pt seen, examined and agree w A/P as above.  RKelly SplinterMD CNewell Rubbermaidpager  [phone]   11/28/2016, 6:10 PM

## 2016-11-29 ENCOUNTER — Telehealth: Payer: Self-pay

## 2016-11-29 DIAGNOSIS — E272 Addisonian crisis: Secondary | ICD-10-CM

## 2016-11-29 DIAGNOSIS — N186 End stage renal disease: Secondary | ICD-10-CM

## 2016-11-29 DIAGNOSIS — Z888 Allergy status to other drugs, medicaments and biological substances status: Secondary | ICD-10-CM

## 2016-11-29 DIAGNOSIS — D472 Monoclonal gammopathy: Secondary | ICD-10-CM | POA: Diagnosis not present

## 2016-11-29 DIAGNOSIS — E162 Hypoglycemia, unspecified: Secondary | ICD-10-CM

## 2016-11-29 DIAGNOSIS — Z992 Dependence on renal dialysis: Secondary | ICD-10-CM | POA: Diagnosis not present

## 2016-11-29 DIAGNOSIS — I120 Hypertensive chronic kidney disease with stage 5 chronic kidney disease or end stage renal disease: Secondary | ICD-10-CM

## 2016-11-29 DIAGNOSIS — E2740 Unspecified adrenocortical insufficiency: Secondary | ICD-10-CM | POA: Diagnosis not present

## 2016-11-29 LAB — GLUCOSE, CAPILLARY
GLUCOSE-CAPILLARY: 110 mg/dL — AB (ref 65–99)
GLUCOSE-CAPILLARY: 106 mg/dL — AB (ref 65–99)

## 2016-11-29 LAB — HEMOGLOBIN A1C
Hgb A1c MFr Bld: 4.7 % — ABNORMAL LOW (ref 4.8–5.6)
Mean Plasma Glucose: 88 mg/dL

## 2016-11-29 LAB — HEPATITIS B SURFACE ANTIBODY,QUALITATIVE: Hep B S Ab: REACTIVE

## 2016-11-29 LAB — HEPATITIS B CORE ANTIBODY, TOTAL: Hep B Core Total Ab: NEGATIVE

## 2016-11-29 MED ORDER — OSELTAMIVIR PHOSPHATE 30 MG PO CAPS: 30.0000 mg | ORAL_CAPSULE | ORAL | 0 refills | Status: DC

## 2016-11-29 MED ORDER — HYDROCORTISONE 5 MG PO TABS
15.0000 mg | ORAL_TABLET | Freq: Every day | ORAL | Status: DC
  Administered 2016-11-29: 15 mg via ORAL
  Filled 2016-11-29: qty 1

## 2016-11-29 MED ORDER — HYDROCORTISONE 5 MG PO TABS: 5.0000 mg | ORAL_TABLET | Freq: Every evening | ORAL | 0 refills | Status: DC

## 2016-11-29 MED ORDER — WHITE PETROLATUM GEL
Status: AC
  Administered 2016-11-29: 10:00:00
  Filled 2016-11-29: qty 1

## 2016-11-29 MED ORDER — HYDROCORTISONE 5 MG PO TABS: ORAL_TABLET | ORAL | 0 refills | Status: DC

## 2016-11-29 NOTE — Discharge Summary (Signed)
Name: Marvin Roberson MRN: [id number] DOB: 07-29-1971 46 y.o. PCP: No Pcp Per Patient  Date of Admission: 11/27/2016  9:16 AM Date of Discharge: 11/29/2016 Attending Physician: Marvin Groves, DO  Discharge Diagnosis: 1.   Adrenal crisis ([place])   Hypoglycemia  Discharge Medications: Allergies as of 11/29/2016      Reactions   Betadine [povidone Iodine]    Gabapentin Other (See Comments)   Put him In hospital   Lisinopril Other (See Comments)   angioedema      Medication List    TAKE these medications   aspirin EC 81 MG tablet Take 81 mg by mouth daily.   atorvastatin 40 MG tablet Commonly known as:  LIPITOR Take 40 mg by mouth daily.   carvedilol 25 MG tablet Commonly known as:  COREG Take 25 mg by mouth 2 (two) times daily with a meal.   hydrALAZINE 100 MG tablet Commonly known as:  APRESOLINE Take 100 mg by mouth 3 (three) times daily.   hydrocortisone 5 MG tablet Commonly known as:  CORTEF Take 3 tablets (15 mg) every morning What changed:  medication strength  how much to take  how to take this  when to take this  additional instructions   hydrocortisone 5 MG tablet Commonly known as:  CORTEF Take 1 tablet (5 mg total) by mouth every evening. What changed:  You were already taking a medication with the same name, and this prescription was added. Make sure you understand how and when to take each.   losartan 100 MG tablet Commonly known as:  COZAAR Take 100 mg by mouth daily.   omeprazole 40 MG capsule Commonly known as:  PRILOSEC Take 40 mg by mouth daily.   oseltamivir 30 MG capsule Commonly known as:  TAMIFLU Take 1 capsule (30 mg total) by mouth Every Tuesday,Thursday,and Saturday with dialysis. Start taking on:  11/30/2016       Disposition and follow-up:   MarvinSujay Roberson was discharged from [facility] in Stable condition.  At the hospital follow up visit please address:  1.   Adrenal insufficiency- Confirm correct  hydrocortisone dosing 15 mg q AM, 5q PM with dose increase for acute illness. Can he be established with endocrinology?   [facility]- Records in care everywhere show that he needs hematology follow up.  2.  Labs / imaging needed at time of follow-up: none   3.  Pending labs/ test needing follow-up: none   Follow-up Appointments: Follow-up Information    [place]. Call.   Why:  Call Monday to schedule your follow up appointment.  Contact information: [place] [phone] [place] Hospital Course by problem list:    Hypoglycemia   Diarrhea   Adrenal crisis [facility])   Flu-like symptoms 46 y.o. male with a PMH of  ESRD (on T/Th/Sa HD), HTN, and stroke who presented to the ED after an episode of feeling sweaty with blurry vision and nausea then passing out. He had had diarrhea, and decreased appetite with decreased po intake for days prior to this episode. He described multiple sick contacts. He described a hx of having these symptoms related to hypoglycemia. In the ED he was found to have a CBG 19 which improved to 280 on BMP after an amp of D50. Review of home medications showed hydrocortisone 5 mg qd. He was unaware of dx of adrenal insufficiency or why he  was on this medication. Extensive review of his chart revealed that this may have developed after long term prednisone use and that he had originally been on hydrocortisone 15 mg qAM and 5 qPM but then the 15 mg AM dose had dropped off of medication list, perhaps accidentally. Does not sound like he has established consistent PCP care. We resumed hydrocortisone 15/5 dosing and counseled on increasing the dose for infection or other stress. Influenza positive, started on tamiflu with renal dosing. Flu was thought to have precipitated adrenal crisis. On the day of discharge hypotension and hypoglycemia had resolved.     ESRD (end stage renal disease) ([place]) Went for  HD on 1/25.   Discharge Vitals:   BP (!) 142/75 (BP Location: Right Leg)   Pulse 63   Temp 97.4 F (36.3 C)   Resp 18   Ht 5\' 7"  (1.702 m)   Wt 129 lb 10.1 oz (58.8 kg)   SpO2 100%   BMI 20.30 kg/m   Pertinent Labs, Studies, and Procedures:  Procedures Performed:  Dg Chest 2 View  Result Date: 11/26/2016 CLINICAL DATA:  Initial evaluation for generalized weakness with chills and cough. EXAM: CHEST  2 VIEW COMPARISON:  None available. FINDINGS: Mild cardiomegaly. Mediastinal silhouette within normal limits. Right-sided central venous catheter terminates at the right atrium. Lungs are clear. No focal infiltrates. No pulmonary edema or pleural effusion. No pneumothorax. No acute osseous abnormality. Surgical clips of vascular stent overlie the left axillary region. IMPRESSION: No active cardiopulmonary disease identified. Electronically Signed   By: Marvin Roberson M.D.   On: 11/26/2016 02:55     Consultations: Treatment Team:  Marvin Jaffe, MD  Discharge Instructions: Discharge Instructions    Call MD for:  difficulty breathing, headache or visual disturbances    Complete by:  As directed    Call MD for:  persistant nausea and vomiting    Complete by:  As directed    Call MD for:  temperature >100.4    Complete by:  As directed    Diet - low sodium heart healthy    Complete by:  As directed    Discharge instructions    Complete by:  As directed    Thank you for trusting [place] with your medical care!  You were hospitalized for adrenal insufficiency and the flu and treated with tamiflu and steroids.   Please take note of the following changes to your medications: START taking Hydrocortisone 15 mg every morning and 5 mg every evening  Take tamiflu after your hemodialysis session on Thursday and Saturday  Dont forget to eat plenty of food   To make sure you are getting better, please make it to the follow-up appointments listed on the first page.  If you have any  questions, please call [phone].   Increase activity slowly    Complete by:  As directed       Signed: Ledell Noss, MD 11/29/2016, 10:46 AM   Pager: [phone]

## 2016-11-29 NOTE — Telephone Encounter (Signed)
[facility] needs to speak with a nurse regarding meds.

## 2016-11-29 NOTE — Progress Notes (Signed)
Internal Medicine Attending:   I saw and examined the patient. I reviewed the resident's note and I agree with the resident's findings and plan as documented in the resident's note. Patient seen on AM rounds, he was already fully dressed, he declined to take off his jacket for pulmonary auscultation.  He otherwise appears well.  His blood sugars and blood pressure appear stable.  Overall I would classify his admission as Adrenal crisis secondary to Influenza A in the setting of likely subtherapeutic dosing of adrenal replacement steroids. From our review of his fragmented records he has adrenal insufficiency from previous steroid use, he has not needed mineralcorticoid replacement this may be partially due to his ESRD.  We cannot find any evidence that his dose of Hydrocortisone was decreased intentionally from 15/5 to only 5mg  daily and are concerned this may have been an error or miscommunication.  We will place him back on his 15mg  AM and 5mg  PM dose.  We have also instructed him on the 3 by 3 rule for steroid dosing.  I would like him to establish with an endocrinologist for ACTH stim testing to further clarify his diagnosis and for better following and titration of his medications but he certainly has multiple issues that need to be followed.  He will be discharged today, we were not able to get him an appointment with [facility] at this time for follow up but have given him the number and we have been told if he calls on Monday he will be given an appointment next week. He was prescribed Tamiflu to complete treatment course for influenza.

## 2016-11-29 NOTE — Telephone Encounter (Signed)
Spoke to pharm, they need clarification on tamiflu, have paged dr blum

## 2016-11-29 NOTE — Telephone Encounter (Signed)
Have clarified script, there is 1 dose to take tomorrow after dialysis, #1 w/ no additional refills, gave to [employer] the pharmacist at [facility]

## 2016-11-29 NOTE — Progress Notes (Signed)
Nsg Discharge Note  Admit Date:  11/27/2016 Discharge date: 11/29/2016   Worley Radermacher to be D/C'd Home per MD order.  AVS completed.  Copy for chart, and copy for patient signed, and dated. Patient/caregiver able to verbalize understanding.  Discharge Medication: Allergies as of 11/29/2016      Reactions   Betadine [povidone Iodine]    Gabapentin Other (See Comments)   Put him In hospital   Lisinopril Other (See Comments)   angioedema      Medication List    TAKE these medications   aspirin EC 81 MG tablet Take 81 mg by mouth daily.   atorvastatin 40 MG tablet Commonly known as:  LIPITOR Take 40 mg by mouth daily.   carvedilol 25 MG tablet Commonly known as:  COREG Take 25 mg by mouth 2 (two) times daily with a meal.   hydrALAZINE 100 MG tablet Commonly known as:  APRESOLINE Take 100 mg by mouth 3 (three) times daily.   hydrocortisone 5 MG tablet Commonly known as:  CORTEF Take 3 tablets (15 mg) every morning What changed:  medication strength  how much to take  how to take this  when to take this  additional instructions   hydrocortisone 5 MG tablet Commonly known as:  CORTEF Take 1 tablet (5 mg total) by mouth every evening. What changed:  You were already taking a medication with the same name, and this prescription was added. Make sure you understand how and when to take each.   losartan 100 MG tablet Commonly known as:  COZAAR Take 100 mg by mouth daily.   omeprazole 40 MG capsule Commonly known as:  PRILOSEC Take 40 mg by mouth daily.   oseltamivir 30 MG capsule Commonly known as:  TAMIFLU Take 1 capsule (30 mg total) by mouth Every Tuesday,Thursday,and Saturday with dialysis. Start taking on:  11/30/2016       Discharge Assessment: Vitals:   11/28/16 2111 11/29/16 0445  BP: 138/72 (!) 142/75  Pulse: 63 63  Resp: 18 18  Temp: 98.2 F (36.8 C) 97.4 F (36.3 C)   Skin clean, dry and intact without evidence of skin break down, no evidence  of skin tears noted. IV catheter discontinued intact. Site without signs and symptoms of complications - no redness or edema noted at insertion site, patient denies c/o pain - only slight tenderness at site.  Dressing with slight pressure applied.  D/c Instructions-Education: Discharge instructions given to patient/family with verbalized understanding. D/c education completed with patient/family including follow up instructions, medication list, d/c activities limitations if indicated, with other d/c instructions as indicated by MD - patient able to verbalize understanding, all questions fully answered. Patient instructed to return to ED, call 911, or call MD for any changes in condition.  Patient escorted via [place], and D/C home via private auto.  Salley Slaughter, RN 11/29/2016 11:34 AM

## 2016-11-29 NOTE — Discharge Instructions (Signed)
Addison Disease Addison disease, which is also called primary adrenal insufficiency, is a condition in which the adrenal glands do not make enough of the hormones cortisol and aldosterone. The disease causes blood pressure to drop and causes potassium to build up to dangerous levels. If Addison disease is not treated, it can suddenly get worse and become a life-threatening condition. A sudden worsening of the disease is called an addisonian crisis. SYMPTOMS Common symptoms of this condition include:  Severe fatigue.  Muscle weakness.  Loss of appetite.  Weight loss.  Darkening of the skin.  Drops in blood pressure. Other symptoms include:  Nausea or vomiting.  Diarrhea.  Dizziness or fainting.  Irritability  Depression.  Salt cravings.  Low blood sugar (hypoglycemia).  Irregular or no menstrual periods. Symptoms usually develop slowly and get worse gradually. DIAGNOSIS This condition may be diagnosed based on your medical history, symptoms, and lab test results. The lab tests include a measurement of your blood cortisol levels. You may also have a CT scan or MRI of the adrenal and pituitary glands. TREATMENT This condition cannot be cured, but it can be managed with medicines that replace cortisol and aldosterone. These medicines may need to be taken several times per day. If you become so sick that you are unable to take these medicines by mouth or you are unable to keep them down, you will need to get them through an injection. Illness, stress, and surgery can increase your body's need for cortisol. It is very important that you talk with your health care provider and understand how to adjust your medicine dosages if you become ill or stressed or if you are to have surgery. HOME CARE INSTRUCTIONS  Take over-the-counter and prescription medicines only as told by your health care provider.  Know how to increase your medicine dosage during periods of stress or mild  illness.  When you travel, carry a needle, a syringe, and an injectable form of cortisol in case of an emergency.  In case of an emergency, wear a warning bracelet or neck chain so that others understand that you have Addison disease.  Carry an ID card that states that you have Addison disease. The card should include:  Instructions to inject a certain amount of medicine if you are severely hurt or cannot respond.  The name and phone number of your health care provider.  The name and phone number of your closest relative. SEEK MEDICAL CARE IF:  You get sick with another illness.  You develop new symptoms. SEEK IMMEDIATE MEDICAL CARE IF:  You have a severe infection or other illness.  You have severe vomiting or diarrhea.  You find it necessary to give yourself injectable medicine.  You have symptoms of an addisonian crisis. These include:  Sudden, severe pain in the lower back, abdomen, or legs.  Severe vomiting and diarrhea.  Dehydration.  Low blood pressure.  Loss of consciousness. This information is not intended to replace advice given to you by your health care provider. Make sure you discuss any questions you have with your health care provider. Document Released: 10/21/2005 Document Revised: 07/12/2015 Document Reviewed: 12/15/2014 Elsevier Interactive Patient Education  2017 [employer].

## 2016-11-30 DIAGNOSIS — N186 End stage renal disease: Secondary | ICD-10-CM | POA: Diagnosis not present

## 2016-11-30 DIAGNOSIS — N2581 Secondary hyperparathyroidism of renal origin: Secondary | ICD-10-CM | POA: Diagnosis not present

## 2016-11-30 DIAGNOSIS — D509 Iron deficiency anemia, unspecified: Secondary | ICD-10-CM | POA: Diagnosis not present

## 2016-11-30 DIAGNOSIS — Z992 Dependence on renal dialysis: Secondary | ICD-10-CM | POA: Diagnosis not present

## 2016-12-02 ENCOUNTER — Encounter: Payer: Self-pay | Admitting: Vascular Surgery

## 2016-12-03 ENCOUNTER — Encounter: Payer: Self-pay | Admitting: Vascular Surgery

## 2016-12-03 DIAGNOSIS — N2581 Secondary hyperparathyroidism of renal origin: Secondary | ICD-10-CM | POA: Diagnosis not present

## 2016-12-03 DIAGNOSIS — N186 End stage renal disease: Secondary | ICD-10-CM | POA: Diagnosis not present

## 2016-12-03 DIAGNOSIS — D509 Iron deficiency anemia, unspecified: Secondary | ICD-10-CM | POA: Diagnosis not present

## 2016-12-03 DIAGNOSIS — Z992 Dependence on renal dialysis: Secondary | ICD-10-CM | POA: Diagnosis not present

## 2016-12-04 DIAGNOSIS — Z992 Dependence on renal dialysis: Secondary | ICD-10-CM | POA: Diagnosis not present

## 2016-12-04 DIAGNOSIS — N186 End stage renal disease: Secondary | ICD-10-CM | POA: Diagnosis not present

## 2016-12-04 DIAGNOSIS — I129 Hypertensive chronic kidney disease with stage 1 through stage 4 chronic kidney disease, or unspecified chronic kidney disease: Secondary | ICD-10-CM | POA: Diagnosis not present

## 2016-12-05 DIAGNOSIS — N2581 Secondary hyperparathyroidism of renal origin: Secondary | ICD-10-CM | POA: Diagnosis not present

## 2016-12-05 DIAGNOSIS — D509 Iron deficiency anemia, unspecified: Secondary | ICD-10-CM | POA: Diagnosis not present

## 2016-12-05 DIAGNOSIS — R7881 Bacteremia: Secondary | ICD-10-CM | POA: Diagnosis not present

## 2016-12-05 DIAGNOSIS — D631 Anemia in chronic kidney disease: Secondary | ICD-10-CM | POA: Diagnosis not present

## 2016-12-05 DIAGNOSIS — N186 End stage renal disease: Secondary | ICD-10-CM | POA: Diagnosis not present

## 2016-12-06 ENCOUNTER — Ambulatory Visit (INDEPENDENT_AMBULATORY_CARE_PROVIDER_SITE_OTHER): Payer: Medicare Other | Admitting: Vascular Surgery

## 2016-12-06 ENCOUNTER — Ambulatory Visit (HOSPITAL_COMMUNITY)
Admission: RE | Admit: 2016-12-06 | Discharge: 2016-12-06 | Disposition: A | Discharge status: Home / Self Care | Payer: Medicare Other | Source: Ambulatory Visit | Attending: Vascular Surgery | Admitting: Vascular Surgery

## 2016-12-06 ENCOUNTER — Encounter: Payer: Self-pay | Admitting: Vascular Surgery

## 2016-12-06 VITALS — BP 143/99 | HR 97 | Temp 98.6°F | Resp 16 | Ht 67.0 in | Wt 140.0 lb

## 2016-12-06 DIAGNOSIS — T82898A Other specified complication of vascular prosthetic devices, implants and grafts, initial encounter: Secondary | ICD-10-CM

## 2016-12-06 DIAGNOSIS — T82511A Breakdown (mechanical) of surgically created arteriovenous shunt, initial encounter: Secondary | ICD-10-CM

## 2016-12-06 DIAGNOSIS — N186 End stage renal disease: Secondary | ICD-10-CM

## 2016-12-06 DIAGNOSIS — E441 Mild protein-calorie malnutrition: Secondary | ICD-10-CM | POA: Insufficient documentation

## 2016-12-06 NOTE — Progress Notes (Signed)
Referred by:  Dr. Marval Regal (Nephrology)  Reason for referral: left arm mass  History of Present Illness  Marvin Roberson is a 46 y.o. ([birth year]) male with complicated history of multiple access complications from an [facility] who presents for evaluation of a large pulsatile mass in left arm.  The patient has already had access procedures in both arms.  He last saw Dr. Rod Can at [facility] for a R RC AVF ligation.  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   Past Medical History:  Diagnosis Date  . ESRD on dialysis [facility]) since 1990s   "TTS; [address]" (11/27/2016)  . GERD (gastroesophageal reflux disease)   . High cholesterol   . Hypertension   . Kidney failure   . Stroke [facility]) 2016   decreased vision in his left eye/notes 11/27/2016    Past Surgical History:  Procedure Laterality Date  . arm surgery Left 2016   "for aneurysm"  . INSERTION OF DIALYSIS CATHETER Right    chest  . THYROIDECTOMY      Social History   Social History  . Marital status: Single    Spouse name: N/A  . Number of children: N/A  . Years of education: N/A   Occupational History  . Not on file.   Social History Main Topics  . Smoking status: Current Every Day Smoker    Packs/day: 0.12    Years: 20.00    Types: Cigarettes  . Smokeless tobacco: Never Used     Comment: 3-4 cigarettes per day.   . Alcohol use No  . Drug use: No  . Sexual activity: Yes   Other Topics Concern  . Not on file   Social History Narrative  . No narrative on file    Family History: patient is unable to detail the medical history of his parents   Current Outpatient Prescriptions  Medication Sig Dispense Refill  . aspirin EC 81 MG tablet Take 81 mg by mouth daily.    Marland Kitchen atorvastatin (LIPITOR) 40 MG tablet Take 40 mg by mouth daily.    . carvedilol (COREG) 25 MG tablet Take 25 mg by mouth 2 (two)  times daily with a meal.    . hydrALAZINE (APRESOLINE) 100 MG tablet Take 100 mg by mouth 3 (three) times daily.    . hydrocortisone (CORTEF) 5 MG tablet Take 3 tablets (15 mg) every morning 60 tablet 0  . hydrocortisone (CORTEF) 5 MG tablet Take 1 tablet (5 mg total) by mouth every evening. 30 tablet 0  . losartan (COZAAR) 100 MG tablet Take 100 mg by mouth daily.    Marland Kitchen omeprazole (PRILOSEC) 40 MG capsule Take 40 mg by mouth daily.     No current facility-administered medications for this visit.     Allergies  Allergen Reactions  . Betadine [Povidone Iodine]   . Gabapentin Other (See Comments)    Put him In hospital  . Lisinopril Other (See Comments)    angioedema    REVIEW OF SYSTEMS:   Cardiac:  positive for: Shortness of breath upon exertion, negative for: Chest pain or chest pressure and Shortness of breath when lying flat,   Vascular:  positive for: intermittent claudication and prior clotted veins,  negative for: Pain in feet at night that wakes you up from your sleep and Leg swelling  Pulmonary:  positive for:  no symptoms,  negative for: Oxygen at home, Productive cough and Wheezing  Neurologic:  positive for: Temporary loss of vision in one eye, negative for: Sudden weakness in arms or legs, Sudden numbness in arms or legs and Sudden onset of difficulty speaking or slurred speech  Gastrointestinal:  positive for: no symptoms, negative for: Blood in stool and Vomited blood  Genitourinary:  positive for: no symptoms, negative for: Burning when urinating and Blood in urine  Psychiatric:  positive for: no symptoms,  negative for: Major depression  Hematologic:  positive for: no symptoms,  negative for: negative for: Problems with blood clotting too easily  Dermatologic:  positive for: no symptoms, negative for: Rashes or ulcers  Constitutional:  positive for: no symptoms, negative for: Fever or chills  Ear/Nose/Throat:  positive for: no  symptoms, negative for: Change in hearing, Nose bleeds and Sore throat  Musculoskeletal:  positive for: no symptoms, negative for: Back pain, Joint pain and Muscle pain   Physical Examination  Vitals:   12/06/16 1418  BP: (!) 143/99  Pulse: 97  Resp: 16  Temp: 98.6 F (37 C)  TempSrc: Oral  SpO2: 98%  Weight: 140 lb (63.5 kg)  Height: 5\' 7"  (1.702 m)    Body mass index is 21.93 kg/m.  General: Alert, O x 3, WD,Ill appearing, hostile  Head: [place]/AT,   Ear/Nose/Throat: Hearing grossly intact, nares without erythema or drainage, oropharynx without Erythema or Exudate , Mallampati score: 3,   Eyes: PERRLA, EOMI,   Neck: Supple, mid-line trachea,    Pulmonary: Sym exp, good B air movt,CTA B  Cardiac: RRR, Nl S1, S2, no Murmurs, No rubs, No S3,S4  Vascular: Vessel Right Left  Radial Palpable Faintly palpable  Brachial Palpable Palpable, pulsatile mass overlying  Carotid Palpable, No Bruit Palpable, No Bruit  Aorta Not palpable N/A  Femoral Palpable Palpable  Popliteal Not palpable Not palpable  PT Not palpable Not palpable  DP Not palpable Not palpable   Gastrointestinal: soft, non-distended, non-tender to palpation, No guarding or rebound, no HSM, no masses, no CVAT B, No palpable prominent aortic pulse,    Musculoskeletal: Testing limited due poor cooperation, Extremities without ischemic changes, contracture in L arm, multiple healed incision in L arm, large pseudoaneurysm overlying or involving brachial artery, tender to touch, no erythema  Neurologic: CN intact grossly intact, sensation testing limited by poor cooperation, Motor exam as listed above  Psychiatric: Judgement intact, Mood & affect inappropriate: angry affect  Dermatologic: See M/S exam for extremity exam, No rashes otherwise noted  Lymph : Palpable lymph nodes: None   L access duplex (12/06/2016)  Possible thrombosing PSA  Suboptimal imaging due to poor patient cooperation due to  pain  Outside Studies/Documentation 20 pages of outside documents were reviewed including: outpatient nephrology, recent hospitalization.   Medical Decision Making  Marvin Roberson is a 46 y.o. male who presents with ESRD requiring hemodialysis, multiple access complications at outside institituion   I recommended he follow up with his vascular surgeons at [facility] as they know his reconstruction best but he refuses.  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  It is unclear to me the reason for his prior PSA.  He is going to need a L arm angiogram, exploration of the left arm, possible repair of brachial PSA  Given his hostile disposition, this procedure will need to be completed under general anesthesia to avoid competition with the patient.  He will be admitted overnight as  he has no one to observe him overnight. Risk, benefits, and alternatives to access surgery were discussed.   The patient is aware the risks include but are not limited to: bleeding, infection, nerve damage, thrombosis, failure to mature, need for additional procedures, death and stroke.   The patient agrees to proceed forward with the procedure.  He is scheduled 7 FEB 18.  Adele Barthel, MD, FACS [facility] Office: [phone] Pager: [phone]  12/06/2016, 5:55 PM

## 2016-12-07 DIAGNOSIS — N2581 Secondary hyperparathyroidism of renal origin: Secondary | ICD-10-CM | POA: Diagnosis not present

## 2016-12-07 DIAGNOSIS — D631 Anemia in chronic kidney disease: Secondary | ICD-10-CM | POA: Diagnosis not present

## 2016-12-07 DIAGNOSIS — N186 End stage renal disease: Secondary | ICD-10-CM | POA: Diagnosis not present

## 2016-12-07 DIAGNOSIS — D509 Iron deficiency anemia, unspecified: Secondary | ICD-10-CM | POA: Diagnosis not present

## 2016-12-07 DIAGNOSIS — R7881 Bacteremia: Secondary | ICD-10-CM | POA: Diagnosis not present

## 2016-12-09 ENCOUNTER — Other Ambulatory Visit: Payer: Self-pay

## 2016-12-09 ENCOUNTER — Encounter (HOSPITAL_COMMUNITY): Payer: Self-pay | Admitting: *Deleted

## 2016-12-09 NOTE — Progress Notes (Signed)
   12/09/16 1906  OBSTRUCTIVE SLEEP APNEA  Have you ever been diagnosed with sleep apnea through a sleep study? No  Do you snore loudly (loud enough to be heard through closed doors)?  1  Do you often feel tired, fatigued, or sleepy during the daytime (such as falling asleep during driving or talking to someone)? 1  Has anyone observed you stop breathing during your sleep? 1  Do you have, or are you being treated for high blood pressure? 1  BMI more than 35 kg/m2? 0  Age > 77 (1-yes) 0  Male Gender (Yes=1) 1  Obstructive Sleep Apnea Score 5  Score 5 or greater  Results sent to PCP

## 2016-12-10 ENCOUNTER — Encounter (HOSPITAL_COMMUNITY): Payer: Self-pay | Admitting: *Deleted

## 2016-12-10 DIAGNOSIS — D509 Iron deficiency anemia, unspecified: Secondary | ICD-10-CM | POA: Diagnosis not present

## 2016-12-10 DIAGNOSIS — D631 Anemia in chronic kidney disease: Secondary | ICD-10-CM | POA: Diagnosis not present

## 2016-12-10 DIAGNOSIS — N186 End stage renal disease: Secondary | ICD-10-CM | POA: Diagnosis not present

## 2016-12-10 DIAGNOSIS — R7881 Bacteremia: Secondary | ICD-10-CM | POA: Diagnosis not present

## 2016-12-10 DIAGNOSIS — N2581 Secondary hyperparathyroidism of renal origin: Secondary | ICD-10-CM | POA: Diagnosis not present

## 2016-12-10 NOTE — Progress Notes (Signed)
I spoke with patinent and asked who is managing his hydrocortisone.  Patient said that he was not sure, but it was some Dr off of [facility]. Patient reported that he saw Dr Madelon Lips at the dialysis center today and she told him to take his regular dose of Hydorcortione - 15 mg in the morning and then they will begin to wean the medication down.

## 2016-12-11 ENCOUNTER — Encounter (HOSPITAL_COMMUNITY)
Admission: RE | Disposition: A | Discharge status: Home / Self Care | Payer: Self-pay | Source: Ambulatory Visit | Attending: Vascular Surgery

## 2016-12-11 ENCOUNTER — Inpatient Hospital Stay (HOSPITAL_COMMUNITY)
Admission: RE | Admit: 2016-12-11 | Discharge: 2016-12-13 | DRG: 252 | Disposition: A | Discharge status: Home / Self Care | Payer: Medicare Other | Source: Ambulatory Visit | Attending: Vascular Surgery | Admitting: Vascular Surgery

## 2016-12-11 ENCOUNTER — Ambulatory Visit (HOSPITAL_COMMUNITY): Payer: Medicare Other | Admitting: Emergency Medicine

## 2016-12-11 ENCOUNTER — Encounter (HOSPITAL_COMMUNITY): Payer: Self-pay | Admitting: *Deleted

## 2016-12-11 DIAGNOSIS — K219 Gastro-esophageal reflux disease without esophagitis: Secondary | ICD-10-CM | POA: Diagnosis present

## 2016-12-11 DIAGNOSIS — G8929 Other chronic pain: Secondary | ICD-10-CM | POA: Diagnosis present

## 2016-12-11 DIAGNOSIS — I721 Aneurysm of artery of upper extremity: Secondary | ICD-10-CM | POA: Diagnosis not present

## 2016-12-11 DIAGNOSIS — Z5329 Procedure and treatment not carried out because of patient's decision for other reasons: Secondary | ICD-10-CM | POA: Diagnosis not present

## 2016-12-11 DIAGNOSIS — N186 End stage renal disease: Secondary | ICD-10-CM | POA: Diagnosis not present

## 2016-12-11 DIAGNOSIS — I120 Hypertensive chronic kidney disease with stage 5 chronic kidney disease or end stage renal disease: Secondary | ICD-10-CM | POA: Diagnosis present

## 2016-12-11 DIAGNOSIS — H5450 Low vision, one eye, unspecified eye: Secondary | ICD-10-CM | POA: Diagnosis present

## 2016-12-11 DIAGNOSIS — E7800 Pure hypercholesterolemia, unspecified: Secondary | ICD-10-CM | POA: Diagnosis present

## 2016-12-11 DIAGNOSIS — I770 Arteriovenous fistula, acquired: Secondary | ICD-10-CM | POA: Diagnosis not present

## 2016-12-11 DIAGNOSIS — T827XXA Infection and inflammatory reaction due to other cardiac and vascular devices, implants and grafts, initial encounter: Secondary | ICD-10-CM | POA: Diagnosis present

## 2016-12-11 DIAGNOSIS — I69398 Other sequelae of cerebral infarction: Secondary | ICD-10-CM

## 2016-12-11 DIAGNOSIS — E2740 Unspecified adrenocortical insufficiency: Secondary | ICD-10-CM | POA: Diagnosis present

## 2016-12-11 DIAGNOSIS — I729 Aneurysm of unspecified site: Secondary | ICD-10-CM

## 2016-12-11 DIAGNOSIS — E875 Hyperkalemia: Secondary | ICD-10-CM | POA: Diagnosis not present

## 2016-12-11 DIAGNOSIS — M7989 Other specified soft tissue disorders: Secondary | ICD-10-CM | POA: Diagnosis not present

## 2016-12-11 DIAGNOSIS — N2581 Secondary hyperparathyroidism of renal origin: Secondary | ICD-10-CM | POA: Diagnosis present

## 2016-12-11 DIAGNOSIS — T82898A Other specified complication of vascular prosthetic devices, implants and grafts, initial encounter: Principal | ICD-10-CM | POA: Diagnosis present

## 2016-12-11 DIAGNOSIS — F17210 Nicotine dependence, cigarettes, uncomplicated: Secondary | ICD-10-CM | POA: Diagnosis present

## 2016-12-11 DIAGNOSIS — D509 Iron deficiency anemia, unspecified: Secondary | ICD-10-CM | POA: Diagnosis not present

## 2016-12-11 DIAGNOSIS — D62 Acute posthemorrhagic anemia: Secondary | ICD-10-CM | POA: Diagnosis present

## 2016-12-11 DIAGNOSIS — Z992 Dependence on renal dialysis: Secondary | ICD-10-CM

## 2016-12-11 DIAGNOSIS — D631 Anemia in chronic kidney disease: Secondary | ICD-10-CM | POA: Diagnosis present

## 2016-12-11 DIAGNOSIS — M898X9 Other specified disorders of bone, unspecified site: Secondary | ICD-10-CM | POA: Diagnosis present

## 2016-12-11 DIAGNOSIS — Z886 Allergy status to analgesic agent status: Secondary | ICD-10-CM | POA: Diagnosis not present

## 2016-12-11 DIAGNOSIS — I742 Embolism and thrombosis of arteries of the upper extremities: Secondary | ICD-10-CM | POA: Diagnosis not present

## 2016-12-11 DIAGNOSIS — B49 Unspecified mycosis: Secondary | ICD-10-CM | POA: Diagnosis not present

## 2016-12-11 DIAGNOSIS — R112 Nausea with vomiting, unspecified: Secondary | ICD-10-CM | POA: Diagnosis not present

## 2016-12-11 DIAGNOSIS — Z888 Allergy status to other drugs, medicaments and biological substances status: Secondary | ICD-10-CM

## 2016-12-11 DIAGNOSIS — Z8673 Personal history of transient ischemic attack (TIA), and cerebral infarction without residual deficits: Secondary | ICD-10-CM | POA: Diagnosis not present

## 2016-12-11 DIAGNOSIS — Z7982 Long term (current) use of aspirin: Secondary | ICD-10-CM

## 2016-12-11 DIAGNOSIS — E8770 Fluid overload, unspecified: Secondary | ICD-10-CM | POA: Diagnosis not present

## 2016-12-11 HISTORY — PX: FALSE ANEURYSM REPAIR: SHX5152

## 2016-12-11 HISTORY — PX: WOUND EXPLORATION: SHX6188

## 2016-12-11 HISTORY — DX: Personal history of other medical treatment: Z92.89

## 2016-12-11 HISTORY — PX: UPPER EXTREMITY ANGIOGRAM: SHX6310

## 2016-12-11 HISTORY — PX: ANGIOPLASTY: SHX39

## 2016-12-11 HISTORY — DX: Unspecified adrenocortical insufficiency: E27.40

## 2016-12-11 HISTORY — DX: Dyspnea, unspecified: R06.00

## 2016-12-11 HISTORY — PX: THROMBECTOMY BRACHIAL ARTERY: SHX6649

## 2016-12-11 LAB — POCT I-STAT 4, (NA,K, GLUC, HGB,HCT)
GLUCOSE: 84 mg/dL (ref 65–99)
HCT: 28 % — ABNORMAL LOW (ref 39.0–52.0)
Hemoglobin: 9.5 g/dL — ABNORMAL LOW (ref 13.0–17.0)
POTASSIUM: 4.4 mmol/L (ref 3.5–5.1)
SODIUM: 140 mmol/L (ref 135–145)

## 2016-12-11 LAB — COMPREHENSIVE METABOLIC PANEL
ALBUMIN: 2.5 g/dL — AB (ref 3.5–5.0)
ALT: 13 U/L — ABNORMAL LOW (ref 17–63)
AST: 18 U/L (ref 15–41)
Alkaline Phosphatase: 45 U/L (ref 38–126)
Anion gap: 8 (ref 5–15)
BILIRUBIN TOTAL: 0.3 mg/dL (ref 0.3–1.2)
BUN: 37 mg/dL — AB (ref 6–20)
CALCIUM: 6.2 mg/dL — AB (ref 8.9–10.3)
CO2: 22 mmol/L (ref 22–32)
Chloride: 109 mmol/L (ref 101–111)
Creatinine, Ser: 7.99 mg/dL — ABNORMAL HIGH (ref 0.61–1.24)
GFR calc Af Amer: 8 mL/min — ABNORMAL LOW (ref >60)
GFR calc non Af Amer: 7 mL/min — ABNORMAL LOW (ref >60)
GLUCOSE: 106 mg/dL — AB (ref 65–99)
POTASSIUM: 5.4 mmol/L — AB (ref 3.5–5.1)
SODIUM: 139 mmol/L (ref 135–145)
TOTAL PROTEIN: 5.8 g/dL — AB (ref 6.5–8.1)

## 2016-12-11 LAB — CREATININE, SERUM
Creatinine, Ser: 8.12 mg/dL — ABNORMAL HIGH (ref 0.61–1.24)
GFR, EST AFRICAN AMERICAN: 8 mL/min — AB (ref >60)
GFR, EST NON AFRICAN AMERICAN: 7 mL/min — AB (ref >60)

## 2016-12-11 LAB — CBC
HEMATOCRIT: 20.6 % — AB (ref 39.0–52.0)
HEMATOCRIT: 19.9 % — AB (ref 39.0–52.0)
HEMOGLOBIN: 6.6 g/dL — AB (ref 13.0–17.0)
HEMOGLOBIN: 6.4 g/dL — AB (ref 13.0–17.0)
MCH: 30.3 pg (ref 26.0–34.0)
MCH: 30.3 pg (ref 26.0–34.0)
MCHC: 32 g/dL (ref 30.0–36.0)
MCHC: 32.2 g/dL (ref 30.0–36.0)
MCV: 94.5 fL (ref 78.0–100.0)
MCV: 94.3 fL (ref 78.0–100.0)
Platelets: 143 10*3/uL — ABNORMAL LOW (ref 150–400)
Platelets: 121 10*3/uL — ABNORMAL LOW (ref 150–400)
RBC: 2.18 MIL/uL — ABNORMAL LOW (ref 4.22–5.81)
RBC: 2.11 MIL/uL — AB (ref 4.22–5.81)
RDW: 19.1 % — ABNORMAL HIGH (ref 11.5–15.5)
RDW: 19.1 % — ABNORMAL HIGH (ref 11.5–15.5)
WBC: 4.4 10*3/uL (ref 4.0–10.5)
WBC: 5 10*3/uL (ref 4.0–10.5)

## 2016-12-11 LAB — PREPARE RBC (CROSSMATCH)

## 2016-12-11 LAB — ABO/RH: ABO/RH(D): A POS

## 2016-12-11 SURGERY — ANGIOGRAM, UPPER EXTREMITY
Anesthesia: General | Site: Arm Upper | Laterality: Left

## 2016-12-11 MED ORDER — DEXTROSE 5 % IV SOLN
1.5000 g | INTRAVENOUS | Status: AC
  Administered 2016-12-11: 1.5 g via INTRAVENOUS
  Filled 2016-12-11: qty 1.5

## 2016-12-11 MED ORDER — OXYCODONE-ACETAMINOPHEN 5-325 MG PO TABS
1.0000 | ORAL_TABLET | ORAL | Status: DC | PRN
  Administered 2016-12-11: 2 via ORAL
  Filled 2016-12-11 (×2): qty 2

## 2016-12-11 MED ORDER — VANCOMYCIN HCL IN DEXTROSE 750-5 MG/150ML-% IV SOLN
750.0000 mg | INTRAVENOUS | Status: DC
  Administered 2016-12-12: 750 mg via INTRAVENOUS
  Filled 2016-12-11: qty 150

## 2016-12-11 MED ORDER — FENTANYL CITRATE (PF) 250 MCG/5ML IJ SOLN
INTRAMUSCULAR | Status: DC | PRN
  Administered 2016-12-11 (×2): 50 ug via INTRAVENOUS
  Administered 2016-12-11: 100 ug via INTRAVENOUS
  Administered 2016-12-11: 50 ug via INTRAVENOUS

## 2016-12-11 MED ORDER — PIPERACILLIN-TAZOBACTAM 3.375 G IVPB 30 MIN
3.3750 g | Freq: Once | INTRAVENOUS | Status: AC
  Administered 2016-12-11: 3.375 g via INTRAVENOUS
  Filled 2016-12-11: qty 50

## 2016-12-11 MED ORDER — MIDAZOLAM HCL 5 MG/5ML IJ SOLN
INTRAMUSCULAR | Status: DC | PRN
  Administered 2016-12-11: 2 mg via INTRAVENOUS

## 2016-12-11 MED ORDER — HEMOSTATIC AGENTS (NO CHARGE) OPTIME
TOPICAL | Status: DC | PRN
  Administered 2016-12-11: 3 via TOPICAL

## 2016-12-11 MED ORDER — LABETALOL HCL 5 MG/ML IV SOLN: 10.0000 mg | INTRAVENOUS | Status: DC | PRN

## 2016-12-11 MED ORDER — LIDOCAINE HCL (CARDIAC) 20 MG/ML IV SOLN
INTRAVENOUS | Status: DC | PRN
  Administered 2016-12-11: 50 mg via INTRATRACHEAL

## 2016-12-11 MED ORDER — LIDOCAINE 2% (20 MG/ML) 5 ML SYRINGE
INTRAMUSCULAR | Status: AC
  Filled 2016-12-11: qty 5

## 2016-12-11 MED ORDER — ONDANSETRON HCL 4 MG/2ML IJ SOLN
4.0000 mg | Freq: Four times a day (QID) | INTRAMUSCULAR | Status: DC | PRN
  Administered 2016-12-11: 4 mg via INTRAVENOUS
  Filled 2016-12-11: qty 2

## 2016-12-11 MED ORDER — IODIXANOL 320 MG/ML IV SOLN
INTRAVENOUS | Status: DC | PRN
  Administered 2016-12-11: 100 mL via INTRAVENOUS

## 2016-12-11 MED ORDER — ACETAMINOPHEN 325 MG PO TABS: 325.0000 mg | ORAL_TABLET | ORAL | Status: DC | PRN

## 2016-12-11 MED ORDER — ONDANSETRON HCL 4 MG/2ML IJ SOLN
INTRAMUSCULAR | Status: AC
  Filled 2016-12-11: qty 2

## 2016-12-11 MED ORDER — PROPOFOL 10 MG/ML IV BOLUS
INTRAVENOUS | Status: AC
  Filled 2016-12-11: qty 20

## 2016-12-11 MED ORDER — HYDROMORPHONE HCL 1 MG/ML IJ SOLN
INTRAMUSCULAR | Status: AC
  Filled 2016-12-11: qty 1

## 2016-12-11 MED ORDER — THROMBIN 20000 UNITS EX SOLR
CUTANEOUS | Status: DC | PRN
  Administered 2016-12-11: 20000 [IU] via TOPICAL

## 2016-12-11 MED ORDER — THROMBIN 20000 UNITS EX SOLR
CUTANEOUS | Status: AC
  Filled 2016-12-11: qty 20000

## 2016-12-11 MED ORDER — DOXERCALCIFEROL 4 MCG/2ML IV SOLN
1.0000 ug | INTRAVENOUS | Status: DC
  Administered 2016-12-12: 1 ug via INTRAVENOUS
  Filled 2016-12-11: qty 2

## 2016-12-11 MED ORDER — HEPARIN SODIUM (PORCINE) 1000 UNIT/ML IJ SOLN
INTRAMUSCULAR | Status: AC
  Filled 2016-12-11: qty 1

## 2016-12-11 MED ORDER — HEPARIN SODIUM (PORCINE) 5000 UNIT/ML IJ SOLN
5000.0000 [IU] | Freq: Three times a day (TID) | INTRAMUSCULAR | Status: DC
  Filled 2016-12-11: qty 1

## 2016-12-11 MED ORDER — ACETAMINOPHEN 325 MG RE SUPP: 325.0000 mg | RECTAL | Status: DC | PRN

## 2016-12-11 MED ORDER — HYDRALAZINE HCL 20 MG/ML IJ SOLN: 5.0000 mg | INTRAMUSCULAR | Status: DC | PRN

## 2016-12-11 MED ORDER — HYDROMORPHONE HCL 1 MG/ML IJ SOLN
0.5000 mg | INTRAMUSCULAR | Status: DC | PRN
  Administered 2016-12-11: 0.5 mg via INTRAVENOUS
  Administered 2016-12-11: 1 mg via INTRAVENOUS
  Administered 2016-12-11: 0.5 mg via INTRAVENOUS
  Administered 2016-12-12 (×3): 1 mg via INTRAVENOUS
  Filled 2016-12-11 (×6): qty 1

## 2016-12-11 MED ORDER — SODIUM CHLORIDE 0.9 % IV SOLN
INTRAVENOUS | Status: DC
  Administered 2016-12-11 (×2): via INTRAVENOUS

## 2016-12-11 MED ORDER — SODIUM CHLORIDE 0.9% FLUSH
3.0000 mL | Freq: Two times a day (BID) | INTRAVENOUS | Status: DC
  Administered 2016-12-12 – 2016-12-13 (×2): 3 mL via INTRAVENOUS

## 2016-12-11 MED ORDER — CALCIUM ACETATE (PHOS BINDER) 667 MG PO CAPS
667.0000 mg | ORAL_CAPSULE | Freq: Three times a day (TID) | ORAL | Status: DC
  Filled 2016-12-11 (×3): qty 1

## 2016-12-11 MED ORDER — DEXTROSE 5 % IV SOLN
INTRAVENOUS | Status: DC | PRN
  Administered 2016-12-11: 30 ug/min via INTRAVENOUS

## 2016-12-11 MED ORDER — ROCURONIUM BROMIDE 50 MG/5ML IV SOSY
PREFILLED_SYRINGE | INTRAVENOUS | Status: AC
  Filled 2016-12-11: qty 5

## 2016-12-11 MED ORDER — SODIUM CHLORIDE 0.9% FLUSH: 3.0000 mL | INTRAVENOUS | Status: DC | PRN

## 2016-12-11 MED ORDER — ASPIRIN EC 81 MG PO TBEC
81.0000 mg | DELAYED_RELEASE_TABLET | Freq: Every day | ORAL | Status: DC
  Administered 2016-12-12 – 2016-12-13 (×2): 81 mg via ORAL
  Filled 2016-12-11 (×2): qty 1

## 2016-12-11 MED ORDER — OXYCODONE HCL 5 MG/5ML PO SOLN: 5.0000 mg | Freq: Once | ORAL | Status: DC | PRN

## 2016-12-11 MED ORDER — GLYCOPYRROLATE 0.2 MG/ML IJ SOLN
INTRAMUSCULAR | Status: DC | PRN
  Administered 2016-12-11: .4 mg via INTRAVENOUS

## 2016-12-11 MED ORDER — HEPARIN SODIUM (PORCINE) 1000 UNIT/ML IJ SOLN
INTRAMUSCULAR | Status: DC | PRN
  Administered 2016-12-11: 2000 [IU] via INTRAVENOUS
  Administered 2016-12-11: 6500 [IU] via INTRAVENOUS
  Administered 2016-12-11: 5000 [IU] via INTRAVENOUS

## 2016-12-11 MED ORDER — ONDANSETRON HCL 4 MG/2ML IJ SOLN
INTRAMUSCULAR | Status: DC | PRN
  Administered 2016-12-11: 4 mg via INTRAVENOUS

## 2016-12-11 MED ORDER — BISACODYL 10 MG RE SUPP: 10.0000 mg | Freq: Every day | RECTAL | Status: DC | PRN

## 2016-12-11 MED ORDER — MIDAZOLAM HCL 2 MG/2ML IJ SOLN
INTRAMUSCULAR | Status: AC
  Filled 2016-12-11: qty 2

## 2016-12-11 MED ORDER — VANCOMYCIN HCL 10 G IV SOLR
1500.0000 mg | Freq: Once | INTRAVENOUS | Status: DC
  Filled 2016-12-11: qty 1500

## 2016-12-11 MED ORDER — HYDRALAZINE HCL 50 MG PO TABS
100.0000 mg | ORAL_TABLET | Freq: Three times a day (TID) | ORAL | Status: DC
  Administered 2016-12-11 – 2016-12-13 (×4): 100 mg via ORAL
  Filled 2016-12-11 (×6): qty 2

## 2016-12-11 MED ORDER — PROTAMINE SULFATE 10 MG/ML IV SOLN
INTRAVENOUS | Status: DC | PRN
  Administered 2016-12-11: 20 mg via INTRAVENOUS
  Administered 2016-12-11 (×2): 10 mg via INTRAVENOUS

## 2016-12-11 MED ORDER — METOPROLOL TARTRATE 5 MG/5ML IV SOLN: 2.0000 mg | INTRAVENOUS | Status: DC | PRN

## 2016-12-11 MED ORDER — DOCUSATE SODIUM 100 MG PO CAPS
100.0000 mg | ORAL_CAPSULE | Freq: Every day | ORAL | Status: DC
  Filled 2016-12-11: qty 1

## 2016-12-11 MED ORDER — NEOSTIGMINE METHYLSULFATE 10 MG/10ML IV SOLN
INTRAVENOUS | Status: DC | PRN
  Administered 2016-12-11: 2.5 mg via INTRAVENOUS

## 2016-12-11 MED ORDER — PANTOPRAZOLE SODIUM 40 MG PO TBEC
40.0000 mg | DELAYED_RELEASE_TABLET | Freq: Every day | ORAL | Status: DC
  Administered 2016-12-12: 40 mg via ORAL
  Filled 2016-12-11: qty 1

## 2016-12-11 MED ORDER — POTASSIUM CHLORIDE CRYS ER 20 MEQ PO TBCR: 20.0000 meq | EXTENDED_RELEASE_TABLET | Freq: Once | ORAL | Status: DC

## 2016-12-11 MED ORDER — SODIUM CHLORIDE 0.9 % IV SOLN: Freq: Once | INTRAVENOUS | Status: DC

## 2016-12-11 MED ORDER — PROTAMINE SULFATE 10 MG/ML IV SOLN
INTRAVENOUS | Status: AC
  Filled 2016-12-11: qty 5

## 2016-12-11 MED ORDER — PIPERACILLIN-TAZOBACTAM 3.375 G IVPB
3.3750 g | Freq: Two times a day (BID) | INTRAVENOUS | Status: DC
  Administered 2016-12-12: 3.375 g via INTRAVENOUS
  Filled 2016-12-11 (×2): qty 50

## 2016-12-11 MED ORDER — HYDROCORTISONE 5 MG PO TABS
5.0000 mg | ORAL_TABLET | Freq: Every evening | ORAL | Status: DC
  Administered 2016-12-11: 5 mg via ORAL
  Filled 2016-12-11: qty 1

## 2016-12-11 MED ORDER — SODIUM CHLORIDE 0.9 % IV SOLN: 250.0000 mL | INTRAVENOUS | Status: DC | PRN

## 2016-12-11 MED ORDER — ATORVASTATIN CALCIUM 40 MG PO TABS
40.0000 mg | ORAL_TABLET | Freq: Every day | ORAL | Status: DC
  Administered 2016-12-11 – 2016-12-13 (×3): 40 mg via ORAL
  Filled 2016-12-11 (×3): qty 1

## 2016-12-11 MED ORDER — ALUM & MAG HYDROXIDE-SIMETH 200-200-20 MG/5ML PO SUSP: 15.0000 mL | ORAL | Status: DC | PRN

## 2016-12-11 MED ORDER — PROPOFOL 10 MG/ML IV BOLUS
INTRAVENOUS | Status: DC | PRN
  Administered 2016-12-11: 150 mg via INTRAVENOUS

## 2016-12-11 MED ORDER — LOSARTAN POTASSIUM 50 MG PO TABS
100.0000 mg | ORAL_TABLET | Freq: Every day | ORAL | Status: DC
  Administered 2016-12-11 – 2016-12-13 (×3): 100 mg via ORAL
  Filled 2016-12-11 (×3): qty 2

## 2016-12-11 MED ORDER — CHLORHEXIDINE GLUCONATE CLOTH 2 % EX PADS: 6.0000 | MEDICATED_PAD | Freq: Once | CUTANEOUS | Status: DC

## 2016-12-11 MED ORDER — GUAIFENESIN-DM 100-10 MG/5ML PO SYRP: 15.0000 mL | ORAL_SOLUTION | ORAL | Status: DC | PRN

## 2016-12-11 MED ORDER — HYDROMORPHONE HCL 1 MG/ML IJ SOLN
0.2500 mg | INTRAMUSCULAR | Status: DC | PRN
  Administered 2016-12-11: 1 mg via INTRAVENOUS

## 2016-12-11 MED ORDER — FENTANYL CITRATE (PF) 250 MCG/5ML IJ SOLN
INTRAMUSCULAR | Status: AC
  Filled 2016-12-11: qty 5

## 2016-12-11 MED ORDER — HYDROCORTISONE 5 MG PO TABS
45.0000 mg | ORAL_TABLET | ORAL | Status: DC
  Filled 2016-12-11 (×2): qty 1

## 2016-12-11 MED ORDER — ONDANSETRON HCL 4 MG/2ML IJ SOLN: 4.0000 mg | Freq: Four times a day (QID) | INTRAMUSCULAR | Status: DC | PRN

## 2016-12-11 MED ORDER — ALBUMIN HUMAN 5 % IV SOLN
INTRAVENOUS | Status: DC | PRN
  Administered 2016-12-11: 13:00:00 via INTRAVENOUS

## 2016-12-11 MED ORDER — LIDOCAINE HCL (PF) 1 % IJ SOLN
INTRAMUSCULAR | Status: AC
  Filled 2016-12-11: qty 30

## 2016-12-11 MED ORDER — PHENOL 1.4 % MT LIQD: 1.0000 | OROMUCOSAL | Status: DC | PRN

## 2016-12-11 MED ORDER — SODIUM CHLORIDE 0.9 % IV SOLN
INTRAVENOUS | Status: DC | PRN
  Administered 2016-12-11: 500 mL

## 2016-12-11 MED ORDER — ROCURONIUM BROMIDE 100 MG/10ML IV SOLN
INTRAVENOUS | Status: DC | PRN
  Administered 2016-12-11: 40 mg via INTRAVENOUS

## 2016-12-11 MED ORDER — 0.9 % SODIUM CHLORIDE (POUR BTL) OPTIME
TOPICAL | Status: DC | PRN
  Administered 2016-12-11: 1000 mL

## 2016-12-11 MED ORDER — OXYCODONE HCL 5 MG PO TABS: 5.0000 mg | ORAL_TABLET | Freq: Once | ORAL | Status: DC | PRN

## 2016-12-11 MED ORDER — CARVEDILOL 25 MG PO TABS
25.0000 mg | ORAL_TABLET | Freq: Two times a day (BID) | ORAL | Status: DC
  Administered 2016-12-12 – 2016-12-13 (×2): 25 mg via ORAL
  Filled 2016-12-11 (×4): qty 1

## 2016-12-11 SURGICAL SUPPLY — 89 items
ADH SKN CLS APL DERMABOND .7 (GAUZE/BANDAGES/DRESSINGS) ×3
ARMBAND PINK RESTRICT EXTREMIT (MISCELLANEOUS) ×4 IMPLANT
BAG BANDED W/RUBBER/TAPE 36X54 (MISCELLANEOUS) ×1 IMPLANT
BAG EQP BAND 135X91 W/RBR TAPE (MISCELLANEOUS) ×3
BAG SNAP BAND KOVER 36X36 (MISCELLANEOUS) ×1 IMPLANT
BALLN MUSTANG 6.0X40 75 (BALLOONS) ×4
BALLOON MUSTANG 6.0X40 75 (BALLOONS) IMPLANT
BANDAGE ACE 4X5 VEL STRL LF (GAUZE/BANDAGES/DRESSINGS) IMPLANT
BANDAGE ELASTIC 4 VELCRO ST LF (GAUZE/BANDAGES/DRESSINGS) ×1 IMPLANT
BANDAGE ESMARK 6X9 LF (GAUZE/BANDAGES/DRESSINGS) IMPLANT
BNDG CMPR 9X4 STRL LF SNTH (GAUZE/BANDAGES/DRESSINGS) ×6
BNDG CMPR 9X6 STRL LF SNTH (GAUZE/BANDAGES/DRESSINGS)
BNDG CMPR MD 5X2 ELC HKLP STRL (GAUZE/BANDAGES/DRESSINGS) ×3
BNDG ELASTIC 2 VLCR STRL LF (GAUZE/BANDAGES/DRESSINGS) ×1 IMPLANT
BNDG ESMARK 4X9 LF (GAUZE/BANDAGES/DRESSINGS) ×2 IMPLANT
BNDG ESMARK 6X9 LF (GAUZE/BANDAGES/DRESSINGS)
CANISTER SUCTION 2500CC (MISCELLANEOUS) ×4 IMPLANT
CATH EMB 3FR 40CM (CATHETERS) ×1 IMPLANT
CATH EMB 4FR 40CM (CATHETERS) ×1 IMPLANT
CATH STRAIGHT 5FR 65CM (CATHETERS) ×1 IMPLANT
CLIP TI MEDIUM 24 (CLIP) ×4 IMPLANT
CLIP TI MEDIUM 6 (CLIP) ×4 IMPLANT
CLIP TI WIDE RED SMALL 24 (CLIP) ×4 IMPLANT
CLIP TI WIDE RED SMALL 6 (CLIP) ×4 IMPLANT
CONT SPEC 4OZ CLIKSEAL STRL BL (MISCELLANEOUS) ×2 IMPLANT
COVER DOME SNAP 22 D (MISCELLANEOUS) ×1 IMPLANT
COVER PROBE W GEL 5X96 (DRAPES) ×2 IMPLANT
COVER SURGICAL LIGHT HANDLE (MISCELLANEOUS) ×4 IMPLANT
CUFF TOURNIQUET SINGLE 18IN (TOURNIQUET CUFF) ×2 IMPLANT
CUFF TOURNIQUET SINGLE 24IN (TOURNIQUET CUFF) IMPLANT
CUFF TOURNIQUET SINGLE 34IN LL (TOURNIQUET CUFF) IMPLANT
CUFF TOURNIQUET SINGLE 44IN (TOURNIQUET CUFF) IMPLANT
DECANTER SPIKE VIAL GLASS SM (MISCELLANEOUS) ×4 IMPLANT
DERMABOND ADVANCED (GAUZE/BANDAGES/DRESSINGS) ×1
DERMABOND ADVANCED .7 DNX12 (GAUZE/BANDAGES/DRESSINGS) ×3 IMPLANT
DRAIN CHANNEL 15F RND FF W/TCR (WOUND CARE) IMPLANT
DRSG COVADERM 4X10 (GAUZE/BANDAGES/DRESSINGS) ×1 IMPLANT
DRSG COVADERM 4X6 (GAUZE/BANDAGES/DRESSINGS) ×1 IMPLANT
DRSG COVADERM 4X8 (GAUZE/BANDAGES/DRESSINGS) IMPLANT
ELECT REM PT RETURN 9FT ADLT (ELECTROSURGICAL) ×4
ELECTRODE REM PT RTRN 9FT ADLT (ELECTROSURGICAL) ×3 IMPLANT
EVACUATOR SILICONE 100CC (DRAIN) IMPLANT
GAUZE SPONGE 4X4 16PLY XRAY LF (GAUZE/BANDAGES/DRESSINGS) ×1 IMPLANT
GLOVE BIO SURGEON STRL SZ 6.5 (GLOVE) ×1 IMPLANT
GLOVE BIO SURGEON STRL SZ7 (GLOVE) ×10 IMPLANT
GLOVE BIOGEL PI IND STRL 6.5 (GLOVE) IMPLANT
GLOVE BIOGEL PI IND STRL 7.5 (GLOVE) ×3 IMPLANT
GLOVE BIOGEL PI INDICATOR 6.5 (GLOVE) ×1
GLOVE BIOGEL PI INDICATOR 7.5 (GLOVE) ×4
GLOVE INDICATOR 7.0 STRL GRN (GLOVE) ×1 IMPLANT
GLOVE INDICATOR 7.5 STRL GRN (GLOVE) ×1 IMPLANT
GLOVE SURG SS PI 7.5 STRL IVOR (GLOVE) ×4 IMPLANT
GOWN STRL REUS W/ TWL LRG LVL3 (GOWN DISPOSABLE) ×9 IMPLANT
GOWN STRL REUS W/TWL 2XL LVL3 (GOWN DISPOSABLE) ×7 IMPLANT
GOWN STRL REUS W/TWL LRG LVL3 (GOWN DISPOSABLE) ×28
GRAFT COLLAGEN VASCULAR 7X40 (Vascular Products) ×1 IMPLANT
KIT BASIN OR (CUSTOM PROCEDURE TRAY) ×4 IMPLANT
KIT ENCORE 26 ADVANTAGE (KITS) ×1 IMPLANT
KIT ROOM TURNOVER OR (KITS) ×4 IMPLANT
NDL PERC 18GX7CM (NEEDLE) IMPLANT
NEEDLE PERC 18GX7CM (NEEDLE) ×4 IMPLANT
NS IRRIG 1000ML POUR BTL (IV SOLUTION) ×8 IMPLANT
PACK CV ACCESS (CUSTOM PROCEDURE TRAY) ×4 IMPLANT
PACK GENERAL/GYN (CUSTOM PROCEDURE TRAY) ×3 IMPLANT
PACK PERIPHERAL VASCULAR (CUSTOM PROCEDURE TRAY) ×3 IMPLANT
PAD ARMBOARD 7.5X6 YLW CONV (MISCELLANEOUS) ×8 IMPLANT
PADDING CAST COTTON 6X4 STRL (CAST SUPPLIES) IMPLANT
SHEATH AVANTI 11CM 5FR (MISCELLANEOUS) ×1 IMPLANT
SPONGE LAP 18X18 X RAY DECT (DISPOSABLE) ×3 IMPLANT
SPONGE SURGIFOAM ABS GEL 100 (HEMOSTASIS) IMPLANT
STAPLER VISISTAT 35W (STAPLE) ×1 IMPLANT
SUT MNCRL AB 4-0 PS2 18 (SUTURE) ×4 IMPLANT
SUT PROLENE 5 0 C 1 24 (SUTURE) ×10 IMPLANT
SUT PROLENE 6 0 BV (SUTURE) ×10 IMPLANT
SUT PROLENE 7 0 BV 1 (SUTURE) ×5 IMPLANT
SUT VIC AB 2-0 CT1 27 (SUTURE) ×4
SUT VIC AB 2-0 CT1 TAPERPNT 27 (SUTURE) ×3 IMPLANT
SUT VIC AB 3-0 SH 27 (SUTURE) ×16
SUT VIC AB 3-0 SH 27X BRD (SUTURE) ×3 IMPLANT
SWAB COLLECTION DEVICE MRSA (MISCELLANEOUS) ×1 IMPLANT
SWAB CULTURE LIQUID MINI MALE (MISCELLANEOUS) ×1 IMPLANT
SYR TB 1ML LUER SLIP (SYRINGE) ×1 IMPLANT
SYR TOOMEY 50ML (SYRINGE) ×1 IMPLANT
SYRINGE 20CC LL (MISCELLANEOUS) ×1 IMPLANT
TOWEL OR 17X24 6PK STRL BLUE (TOWEL DISPOSABLE) ×1 IMPLANT
TRAY FOLEY W/METER SILVER 16FR (SET/KITS/TRAYS/PACK) ×3 IMPLANT
UNDERPAD 30X30 (UNDERPADS AND DIAPERS) ×4 IMPLANT
WATER STERILE IRR 1000ML POUR (IV SOLUTION) ×7 IMPLANT
WIRE BENTSON .035X145CM (WIRE) ×1 IMPLANT

## 2016-12-11 NOTE — H&P (View-Only) (Signed)
Referred by:  Dr. Marval Regal (Nephrology)  Reason for referral: left arm mass  History of Present Illness  Marvin Roberson is a 46 y.o. ([birth year]) male with complicated history of multiple access complications from an [facility] who presents for evaluation of a large pulsatile mass in left arm.  The patient has already had access procedures in both arms.  He last saw Dr. Rod Can at [facility] for a R RC AVF ligation.  He also had a L ax-brachial bypass with PTFE for PSA of L ax and brachial artery at [facility]  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   Past Medical History:  Diagnosis Date  . ESRD on dialysis [facility]) since 1990s   "TTS; [address]" (11/27/2016)  . GERD (gastroesophageal reflux disease)   . High cholesterol   . Hypertension   . Kidney failure   . Stroke [facility]) 2016   decreased vision in his left eye/notes 11/27/2016    Past Surgical History:  Procedure Laterality Date  . arm surgery Left 2016   "for aneurysm"  . INSERTION OF DIALYSIS CATHETER Right    chest  . THYROIDECTOMY      Social History   Social History  . Marital status: Single    Spouse name: N/A  . Number of children: N/A  . Years of education: N/A   Occupational History  . Not on file.   Social History Main Topics  . Smoking status: Current Every Day Smoker    Packs/day: 0.12    Years: 20.00    Types: Cigarettes  . Smokeless tobacco: Never Used     Comment: 3-4 cigarettes per day.   . Alcohol use No  . Drug use: No  . Sexual activity: Yes   Other Topics Concern  . Not on file   Social History Narrative  . No narrative on file    Family History: patient is unable to detail the medical history of his parents   Current Outpatient Prescriptions  Medication Sig Dispense Refill  . aspirin EC 81 MG tablet Take 81 mg by mouth daily.    Marland Kitchen atorvastatin (LIPITOR) 40 MG tablet Take 40 mg by mouth daily.    . carvedilol (COREG) 25 MG tablet Take 25 mg by mouth 2 (two)  times daily with a meal.    . hydrALAZINE (APRESOLINE) 100 MG tablet Take 100 mg by mouth 3 (three) times daily.    . hydrocortisone (CORTEF) 5 MG tablet Take 3 tablets (15 mg) every morning 60 tablet 0  . hydrocortisone (CORTEF) 5 MG tablet Take 1 tablet (5 mg total) by mouth every evening. 30 tablet 0  . losartan (COZAAR) 100 MG tablet Take 100 mg by mouth daily.    Marland Kitchen omeprazole (PRILOSEC) 40 MG capsule Take 40 mg by mouth daily.     No current facility-administered medications for this visit.     Allergies  Allergen Reactions  . Betadine [Povidone Iodine]   . Gabapentin Other (See Comments)    Put him In hospital  . Lisinopril Other (See Comments)    angioedema    REVIEW OF SYSTEMS:   Cardiac:  positive for: Shortness of breath upon exertion, negative for: Chest pain or chest pressure and Shortness of breath when lying flat,   Vascular:  positive for: intermittent claudication and prior clotted veins,  negative for: Pain in feet at night that wakes you up from your sleep and Leg swelling  Pulmonary:  positive for:  no symptoms,  negative for: Oxygen at home, Productive cough and Wheezing  Neurologic:  positive for: Temporary loss of vision in one eye, negative for: Sudden weakness in arms or legs, Sudden numbness in arms or legs and Sudden onset of difficulty speaking or slurred speech  Gastrointestinal:  positive for: no symptoms, negative for: Blood in stool and Vomited blood  Genitourinary:  positive for: no symptoms, negative for: Burning when urinating and Blood in urine  Psychiatric:  positive for: no symptoms,  negative for: Major depression  Hematologic:  positive for: no symptoms,  negative for: negative for: Problems with blood clotting too easily  Dermatologic:  positive for: no symptoms, negative for: Rashes or ulcers  Constitutional:  positive for: no symptoms, negative for: Fever or chills  Ear/Nose/Throat:  positive for: no  symptoms, negative for: Change in hearing, Nose bleeds and Sore throat  Musculoskeletal:  positive for: no symptoms, negative for: Back pain, Joint pain and Muscle pain   Physical Examination  Vitals:   12/06/16 1418  BP: (!) 143/99  Pulse: 97  Resp: 16  Temp: 98.6 F (37 C)  TempSrc: Oral  SpO2: 98%  Weight: 140 lb (63.5 kg)  Height: 5\' 7"  (1.702 m)    Body mass index is 21.93 kg/m.  General: Alert, O x 3, WD,Ill appearing, hostile  Head: [place]/AT,   Ear/Nose/Throat: Hearing grossly intact, nares without erythema or drainage, oropharynx without Erythema or Exudate , Mallampati score: 3,   Eyes: PERRLA, EOMI,   Neck: Supple, mid-line trachea,    Pulmonary: Sym exp, good B air movt,CTA B  Cardiac: RRR, Nl S1, S2, no Murmurs, No rubs, No S3,S4  Vascular: Vessel Right Left  Radial Palpable Faintly palpable  Brachial Palpable Palpable, pulsatile mass overlying  Carotid Palpable, No Bruit Palpable, No Bruit  Aorta Not palpable N/A  Femoral Palpable Palpable  Popliteal Not palpable Not palpable  PT Not palpable Not palpable  DP Not palpable Not palpable   Gastrointestinal: soft, non-distended, non-tender to palpation, No guarding or rebound, no HSM, no masses, no CVAT B, No palpable prominent aortic pulse,    Musculoskeletal: Testing limited due poor cooperation, Extremities without ischemic changes, contracture in L arm, multiple healed incision in L arm, large pseudoaneurysm overlying or involving brachial artery, tender to touch, no erythema  Neurologic: CN intact grossly intact, sensation testing limited by poor cooperation, Motor exam as listed above  Psychiatric: Judgement intact, Mood & affect inappropriate: angry affect  Dermatologic: See M/S exam for extremity exam, No rashes otherwise noted  Lymph : Palpable lymph nodes: None   L access duplex (12/06/2016)  Possible thrombosing PSA  Suboptimal imaging due to poor patient cooperation due to  pain  Outside Studies/Documentation 20 pages of outside documents were reviewed including: outpatient nephrology, recent hospitalization.   Medical Decision Making  Marvin Roberson is a 46 y.o. male who presents with ESRD requiring hemodialysis, multiple access complications at outside institituion   I recommended he follow up with his vascular surgeons at [facility] as they know his reconstruction best but he refuses.  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  It is unclear to me the reason for his prior PSA.  He is going to need a L arm angiogram, exploration of the left arm, possible repair of brachial PSA  Given his hostile disposition, this procedure will need to be completed under general anesthesia to avoid competition with the patient.  He will be admitted overnight as  he has no one to observe him overnight. Risk, benefits, and alternatives to access surgery were discussed.   The patient is aware the risks include but are not limited to: bleeding, infection, nerve damage, thrombosis, failure to mature, need for additional procedures, death and stroke.   The patient agrees to proceed forward with the procedure.  He is scheduled 7 FEB 18.  Adele Barthel, MD, FACS [facility] Office: [phone] Pager: [phone]  12/06/2016, 5:55 PM

## 2016-12-11 NOTE — Anesthesia Preprocedure Evaluation (Signed)
Anesthesia Evaluation  Patient identified by MRN, date of birth, ID band Patient awake    Reviewed: Allergy & Precautions, H&P , NPO status , Patient's Chart, lab work & pertinent test results  Airway Mallampati: II   Neck ROM: full    Dental   Pulmonary Current Smoker,    breath sounds clear to auscultation       Cardiovascular hypertension,  Rhythm:regular Rate:Normal     Neuro/Psych CVA, Residual Symptoms    GI/Hepatic GERD  ,  Endo/Other    Renal/GU ESRF and DialysisRenal disease     Musculoskeletal   Abdominal   Peds  Hematology   Anesthesia Other Findings   Reproductive/Obstetrics                             Anesthesia Physical Anesthesia Plan  ASA: III  Anesthesia Plan: General   Post-op Pain Management:    Induction: Intravenous  Airway Management Planned: Oral ETT  Additional Equipment:   Intra-op Plan:   Post-operative Plan: Extubation in OR  Informed Consent: I have reviewed the patients History and Physical, chart, labs and discussed the procedure including the risks, benefits and alternatives for the proposed anesthesia with the patient or authorized representative who has indicated his/her understanding and acceptance.     Plan Discussed with: CRNA, Anesthesiologist and Surgeon  Anesthesia Plan Comments:         Anesthesia Quick Evaluation

## 2016-12-11 NOTE — Anesthesia Procedure Notes (Signed)
Procedure Name: Intubation Date/Time: 12/11/2016 8:36 AM Performed by: Mariea Clonts Pre-anesthesia Checklist: Patient identified, Emergency Drugs available, Suction available and Patient being monitored Patient Re-evaluated:Patient Re-evaluated prior to inductionOxygen Delivery Method: Circle System Utilized Preoxygenation: Pre-oxygenation with 100% oxygen Intubation Type: IV induction Ventilation: Mask ventilation without difficulty Laryngoscope Size: Miller and 2 Grade View: Grade II Tube type: Oral Tube size: 7.5 mm Number of attempts: 1 Airway Equipment and Method: Stylet and Oral airway Placement Confirmation: ETT inserted through vocal cords under direct vision,  positive ETCO2 and breath sounds checked- equal and bilateral Tube secured with: Tape Dental Injury: Teeth and Oropharynx as per pre-operative assessment

## 2016-12-11 NOTE — Anesthesia Postprocedure Evaluation (Signed)
Anesthesia Post Note  Patient: Marvin Roberson  Procedure(s) Performed: Procedure(s) (LRB): LEFT ARM ANGIOGRAM (Left) RESECTION  BRACHIAL ARTERY (Left) LEFT ARM BRACHIAL ARTERY WITH INTERPOSTIONAL GRAFT (Left) ANGIOPLASTY LEFT ARM & SUBCLAVIAN ARTERY (Left) THROMBECTOMY BRACHIAL ARTERY AND ULNAR  Patient location during evaluation: PACU Anesthesia Type: General Level of consciousness: awake and alert and patient cooperative Pain management: pain level controlled Vital Signs Assessment: post-procedure vital signs reviewed and stable Respiratory status: spontaneous breathing and respiratory function stable Cardiovascular status: stable Anesthetic complications: no       Last Vitals:  Vitals:   12/11/16 1405 12/11/16 1415  BP: (!) 104/54   Pulse: 70 (!) 59  Resp: 16 15  Temp:      Last Pain:  Vitals:   12/11/16 1415  TempSrc:   PainSc: [place]

## 2016-12-11 NOTE — Op Note (Addendum)
OPERATIVE NOTE   PROCEDURE: 1. Left forearm arm exploration 2. Resection of left brachial artery pseudoaneurysm 3. Repair of left brachial artery with interposition graft (6 mm Artegraft) 4. Open cannulation of left axillobrachial graft 5. Left arm angiogram 6. Left subclavian artery angioplasty (6 mm x 40 mm) 7. Thrombectomy of axillobrachial graft, left brachial artery and ulnar artery 8. Exploration of left radial artery  PRE-OPERATIVE DIAGNOSIS: expanding recurrent left brachial artery pseudoaneurysm  POST-OPERATIVE DIAGNOSIS: same as above   SURGEON: Adele Barthel, MD  ASSISTANT(S): Silva Bandy, PAC   ANESTHESIA: general  ESTIMATED BLOOD LOSS: 500 cc  FINDING(S): 1.  Thickened chronically thrombosed basilic and cephalic vein 2.  Extensive scarring throughout forearm 3.  2-3 cm thickened chronic pseudoaneurysm wall with extensive intraluminal thrombus: excellent retrograde bleeding evident after thrombectomy 4.  Evidence of prior sutures in this pseudoaneurysm 5.  Monophasic brachial artery signal prior to intervention on brachial artery 6.  Strongly dopplerable ulnar artery after brachial artery reconstruction with faint radial artery signal 7.  Patent left subclavian artery with >75% stenosis at proximal anastomosis of axillobrachial graft: near resolved with angioplasty 8.  Acute thrombosis of the distal axillobrachial graft after proximal angioplasty, suggestive of likely embolism from angioplasty of proximal graft 9.  Acute thrombus obtained from brachial and ulnar arteries and axillobrachial graft 10.  Angiogram after thrombectomy suggests proximal 1/3 forearm brachial artery bifurcation with patent brachial artery with some distal disease <30% 11.  Brachial artery bifurcates into an ulnar artery that appears to be in spasm and a radial artery that appears to occlude proximally 12.  Chronically thrombosed distal radial artery 13.  Strongly dopplerable ulnar after at  end of case with intact palmar arch signal  SPECIMEN(S):  Culture and sensitivity of liquefied thrombus from pseudoaneurysm, pseudoaneurysm wall sent for permanent section  INDICATIONS:   Marvin Roberson is a 46 y.o. male with multiple co-morbidities including a left axillary artery pseudoaneurysm and brachial artery pseudoaneurysm who underwent an left axillobrachial bypass at [facility].  The patient saw me with reported rapidly enlarging mass in his left forearm.  I recommended he follow up with [facility] given they had done his prior procedures.  The patient refused referral back to [facility].  I offered the patient a left arm angiogram, possible antecubital exploration and repair of pseudoaneurysm.  I discussed with the patient the nature of angiographic procedures, especially the limited patencies of any endovascular intervention.  The patient is aware of that the risks of an angiographic procedure include but are not limited to: bleeding, infection, access site complications, renal failure, embolization, rupture of vessel, dissection, arteriovenous fistula, possible need for emergent surgical intervention, possible need for surgical procedures to treat the patient's pathology, anaphylactic reaction to contrast, and stroke and death.  The risk, benefits, and alternative for brachial artery reconstruction were discussed with the patient.  The patient is aware the risks include but are not limited to: bleeding, infection, myocardial infarction, stroke, limb loss, nerve damage, need for additional procedures in the future, wound complications, and inability to complete the reconstruction.  The patient is aware of these risks and agreed to proceed.   DESCRIPTION: After obtaining full informed written consent, the patient was brought back to the operating room and placed supine upon the operating table.  The patient received IV antibiotics prior to induction.  After obtaining adequate anesthesia, the patient was prepped and  draped in the standard fashion for: left arm access procedure.  The patient was given 6500  units of Heparin intravenously, which was a therapeutic bolus. An additional 2000 units of Heparin was administered every hour after initial bolus to maintain anticoagulation.  In total, 15500 units of Heparin was administrated to achieve and maintain a therapeutic level of anticoagulation. After waiting 3 minutes, I placed a sterile tourniquet on the upper arm and exsanguinated the left arm with an Esmark bandage.  The tourniquet was first inflated to 250 mm Hg.  This did not stop the pulse in the pseudoaneurysm.  The tourniquet was reinflated to 350 mm Hg.  This also did not stop the pulse.  I exchanged the machine and the tourniquet.  Despite these changes, there remained continued pulsations in the pseudoaneurysm.  Either the brachial artery is calcified or the graft is non-compressible.  I left the tourniquet up to limit the bleeding with dissection.  I made an incision over the proximal forearm, extending into the antecubitum, through the prior forearm incision.  Using blunt dissection and electrocautery, I dissected out the brachial artery pseudoaneurysm.  The median nerve could be identified, but the brachial artery could not be clearly identified proximally or distally as there appeared to be a large inflammatory rind.  Eventually, I determined that the prior axillobrachial graft was easily discerned in the mid-upper arm.  I dissected out this graft so I had control of the inflow to this brachial artery.  After clamping the graft, the pulse in the pseudoaneurysm stopped.  I deflated the upper arm tourniquet and removed it.    I also applied a manual tourniquet using the Esmark bandage on the distal forearm.  I made an incision in the brachial artery pseudoaneurysm: immediately chronic thrombus was evident.  It took additional sharp dissection to get into the lumen of this pseudoaneurysm.  The wall of this chronic  pseudoaneurysm cavity was 2-3 cm thick.  While there was no frank infection evident, the appearance of the brachial artery with thickened inflammatory rind was reminiscent of a mycotic pseudoaneurysm.  I opened the anterior wall of the pseudoaneurysm proximally and distally until I could identify lumen of normal artery.  I resected ~3 cm segment of pseudoaneurysm wall and sent it as specimen for formal evaluation.  Essentially the posterior wall of this pseudoaneurysm was obliterated without obvious residual arterial wall.  In dissecting out the distal segment, I ran across prior prolene sutures.   I elected to reconstruct this brachial artery with an 6 mm Artegraft segment given the lack of vein conduit in the left arm.  Neither leg had been prepared for vein harvest.  I also suspect neither greater saphenous vein was adequate given the axillobrachial bypass with PTFE.    After preparing the Artegraft per manufacturer instructions, I spatulated one of the graft and sewed the graft to the proximal brachial artery with a running stitch of 6-0 Prolene in an end-to-end fashion.  I had to switch to 5-0 Prolene at one point in this anastomosis given the thickness of the surrounding inflammatory rind.  I elected to use this surrounding rind as a bolster to the anastomotic line given the relative thiness of the artery.  I clamped the graft distally and then unclamped the axillobrachial graft.  There was a weak pulse in this graft, suggestive of a more proximal flow restriction, so angiography was going to be necessary.  I bled the graft and there was reasonably vigorous arterial bleeding.  I clamped the distal graft and marked the anterior orientation.  I clamped the axillobrachial graft  again and washed out the graft and artery with heparinized saline.  I then spatulated the distal brachial artery which only appeared to be 3 mm.  I spatulated the distal Artegraft, shortening the graft in the process. I sewed the graft  to the distal brachial artery with a running stitch of 6-0 Prolene in an end-to-end configuration.  Prior to completing this anastomosis, I allowed the distal end to backbled: excellent backbleeding.  I also allowed the proximal brachial artery to bleed: vigorous arterial bleeding without clot.  I completed this anastomosis in the usual fashion.  There was extensive bleeding from the subcutaneous tissue at this point, so I spent 15 minutes controlling bleeders with electrocautery, suture ligation, and Avitene.  I was forced to give the patient 50 mg of Protamine to slow down the bleeding.  Eventually the bleeding slowed to an ooze.  I reapproximated the subcutaneous tissue in the forearm incision with a double layer of 3-0 Vicryl.  At this point, I cannulated the distal axillobrachial graft with a 18 gauge needle and passed the short J-wire into the graft.  I loaded the 5-Fr sheath over the wire.  I then loaded a straight catheter and Bentson wire into the sheath,  I did a hand injection which demonstrated a patent left subclavian artery with >75% stenosis at the proximal anastomosis of the graft.  I obtained a 6 mm x 40 mm balloon and centered the balloon on the anastomosis.  I inflated the balloon at 10 atm for 2 minutes.  An obvious hemodynamically significant waist was noted in this process.  The balloon was deflated and removed.  The completion images demonstrated near total resolution of the proximal stenosis.  Unfortunately, the graft appeared to dead end just proximal to the sheath placement, concerning for embolism from the angioplasty.  I repaired the puncture in the graft with a 6-0 Prolene Z-stitch.  The sheath was removed while tying the stitch.  At this point, I elected to reopen the forearm incision, sharply transecting the prior 3-0 Vicryl sutures.  There was no pulse in the Artegraft segment.  I gave the patient another 5000 units of Heparin intravenous.   I made a transverse incision in the  Artegraft and extended it enough to facilitate a thrombectomy.  First I passed a 3 mm Fogarty down to the level of the wrist distally.  I extracted acute thrombus on 2 passes.  After another 3 passes, there was no further thrombus and there was reasonable backbleeding.  I clamped this end of the graft. I then tried to pass a 3 and 4 Fogarty proximally but both only advanced so far, removing some acute thrombus without full restoration of antegrade bleeding.  I clamped the Artegraft proximally as there was some bleeding at this point.  I turned my attention to the distal axillobrachial graft.  I removed one external ring and then made a transverse incision.  There was some bleeding, so I clamped the graft proximally.  I then passed a 4 Fogarty distally until I encountered the proximal clamp.  I removed this, allowing me to clearly see the Fogarty pass by the graftotomy.  I extract a chunk of acute thrombus on one pass. There was no further clot on the next 2 passes.  I then finally I passed the 4 Fogartry proximally.  This popped out a plug of thrombus which lead to torrential bleeding from the graft.  I reclamped this graft.    At this point, I elected  to repair the grafotomy with two stitches of 6-0 Prolene, sewing one from each end of the grafotomy and tying in the middle.  I then repeated this same process on the segment of Artegraft.  Prior to completing this, I bled the proximal segment: no thrombus was noted with excellent antegrade bleeding.  I also backbled the distal brachial artery: no thrombus with good backbleeding.  The repair was completed without any difficulty.   At this point, I could doppler an ulnar artery and palmar arch but no radial artery.  I again cannulated the distal axillobrachial graft as previously and then completed the distal arm angiogram on this left arm by hand injections via the sheath.  This demonstrated patency of the distal axillobrachial graft, intervening diseased  brachial artery and widely patent Artegraft segment leading to a brachial bifurcation in the proximal 1/3 of the forearm with slow flow into the ulnar and radial arteries.  The radial artery flow appeared to end.  The ulnar artery appeared to be spasmed.  Given the youth of this patient, I felt an attempt at exploring the radial artery was indicated.  I made an incision through the prior distal incision over the radial artery.  I could identify a prior radiocepahlic arteriovenous fistula.  Following the fistula down to where the artery would be, I could only find a chronically occluded small radial artery that was hardened.  The more proximal segment appeared to be diseased also.  At this point, all incisions were packed with Avitene.  After waiting a few minutes, bleeding points were controlled with electrocautery.  I repaired each incision with a layer of 3-0 Vicryl.  The forearm incision had an extra layer of 3-0 Vicryl.  The skin at each incision was then reapproximated with staples.  At the end of the case, this patient had a strongly dopplerable ulnar signal without any radial signal.  His palmar arch remained intact.   COMPLICATIONS: none  CONDITION: guarded   Adele Barthel, MD, FACS [facility] [place] Office: [phone] Pager: [phone]  12/11/2016, 1:54 PM

## 2016-12-11 NOTE — Progress Notes (Signed)
Dr Bridgett Larsson notified of h/h 6.6/ 20.6, orders for 2 units prbcs rec'd/ blood bank notified

## 2016-12-11 NOTE — Progress Notes (Addendum)
Pharmacy Antibiotic Note  Marvin Roberson is a 46 y.o. male  with concern of infected left brachial pseudoaneurysm s/p OR on 2/7.  Pharmacy has been consulted for zosyn and vancomycin dosing. He is noted with ESRD on HD (plans for HD on 12/12/16). No recent vancomycin given -WBC= 4.4, afebrile, SCr= 12.7 on 11/28/16  Plan: -Vancomycin 1500mg  IV x1 followed by 750mg  IV TTS post HD -Zosyn 3.375gm x1 then 2.25gm IV q12h -Will follow cultures and clinical progress  Height: 5\' 7"  (170.2 cm) Weight: 140 lb (63.5 kg) IBW/kg (Calculated) : 66.1  Temp (24hrs), Avg:98 F (36.7 C), Min:97.9 F (36.6 C), Max:98 F (36.7 C)   Recent Labs Lab 12/11/16 1335  WBC 4.4    Estimated Creatinine Clearance: 6.6 mL/min (by C-G formula based on SCr of 12.76 mg/dL (H)).    Allergies  Allergen Reactions  . Gabapentin Other (See Comments)    Sleepiness, altered mental state  . Lisinopril Other (See Comments)    angioedema  . Betadine [Povidone Iodine] Rash    Antimicrobials this admission: 2/7 vanc 2/7 zosyn  Dose adjustments this admission:   Microbiology results: 2/7 blood x2  Thank you for allowing pharmacy to be a part of this patient's care.  Hildred Laser, Pharm D 12/11/2016 3:41 PM

## 2016-12-11 NOTE — Interval H&P Note (Signed)
History and Physical Interval Note:  12/11/2016 8:08 AM  [name]  has presented today for surgery, with the diagnosis of Left arm pseudoaneurysm I72.9; End stage renal disease N18.6  The various methods of treatment have been discussed with the patient and family. After consideration of risks, benefits and other options for treatment, the patient has consented to  Procedure(s): LEFT ARM ANGIOGRAM (Left) POSSIBLE REPAIR BRACHIAL PSEUDOANEURYSM (Left) POSSIBLE LEFT ARM WOUND EXPLORATION (Left) as a surgical intervention .  [name] history has been reviewed, patient examined, no change in status, stable for surgery.  I have reviewed the patient's chart and labs.  Questions were answered to the patient's satisfaction.     Adele Barthel

## 2016-12-11 NOTE — Progress Notes (Signed)
CRITICAL VALUE ALERT  Critical value received:  Calcium 6.2  Date of notification:  12-11-2016  Time of notification:  9381  Critical value read back: yes  Nurse who received alert:  Grant Fontana BSN, RN  MD notified (1st page):  Bridgett Larsson  Time of first page:  1545  MD notified (2nd page): Bridgett Larsson  Time of second page: 1619  Responding MD:  Time MD responded:

## 2016-12-11 NOTE — Consult Note (Signed)
[facility] Renal Consultation Note    Indication for Consultation:  Management of ESRD/hemodialysis; anemia, hypertension/volume and secondary hyperparathyroidism PCP: none Referring Physician: Dr. Bridgett Larsson  HPI: Marvin Roberson is a 46 y.o. male with ESRD ([facility]) secondary to HTN on HD since 2001 who transferred to [facility] in January 2018 from [place].  Prior to that he lived in [place].  He has had multiple vascular access procedures over time.  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  He was seen by Dr. Bridgett Larsson for access evaluation who recommended he f/u with surgeons at [facility] but he refused.  He told me several weeks ago he had surgery planned in [place], but he was tired of waiting to get it done.  He is admitted following surgery today but Dr. Bridgett Larsson. He reportedly had blood loss of 900 ml per PACU RN and post sugery hgb is 6.6 down from baseline 9.5 His last dialysis was 2/6.  When seen in the PACU he is very difficult to arouse and will not answer my question where he goes to dialysis.  In addition to general anesthesia, he has received IV dilaudid.   Additionally, he had an admission for ? adrenal crisis with low BP and low BS, influenza A. He was discharged on hydrocortisone 15 mg q am and 5 mg q pm.  Past Medical History:  Diagnosis Date  . Adrenal insufficiency ([place])   . Dyspnea    "when I have too much fluid"  . ESRD on dialysis [facility]) since 1990s   "TTS; [address]" (11/27/2016)  . GERD (gastroesophageal reflux disease)   . High cholesterol   . History of blood transfusion   . Hypertension   . Kidney failure   . Stroke [facility]) 2016   decreased vision in his left eye/notes 11/27/2016   Past Surgical History:  Procedure Laterality Date  . arm surgery Left 2016   "for aneurysm"  . AV FISTULA PLACEMENT    . INSERTION OF DIALYSIS CATHETER Right    chest  . THYROIDECTOMY     History reviewed. No pertinent family history. Social History:  reports that he  has been smoking Cigarettes.  He has a 2.40 pack-year smoking history. He has never used smokeless tobacco. He reports that he does not drink alcohol or use drugs. Allergies  Allergen Reactions  . Gabapentin Other (See Comments)    Sleepiness, altered mental state  . Lisinopril Other (See Comments)    angioedema  . Betadine [Povidone Iodine] Rash   Prior to Admission medications   Medication Sig Start Date End Date Taking? Authorizing Provider  aspirin EC 81 MG tablet Take 81 mg by mouth daily.   Yes Historical Provider, MD  atorvastatin (LIPITOR) 40 MG tablet Take 40 mg by mouth daily.   Yes Historical Provider, MD  carvedilol (COREG) 25 MG tablet Take 25 mg by mouth 2 (two) times daily with a meal.   Yes Historical Provider, MD  hydrALAZINE (APRESOLINE) 100 MG tablet Take 100 mg by mouth 3 (three) times daily.   Yes Historical Provider, MD  hydrocortisone (CORTEF) 5 MG tablet Take 3 tablets (15 mg) every morning 11/29/16  Yes Ledell Noss, MD  hydrocortisone (CORTEF) 5 MG tablet Take 1 tablet (5 mg total) by mouth every evening. 11/29/16  Yes Ledell Noss, MD  losartan (COZAAR) 100 MG tablet Take 100 mg by mouth daily.   Yes Historical Provider, MD  omeprazole (PRILOSEC) 40 MG capsule Take 40  mg by mouth daily.   Yes Historical Provider, MD   Current Facility-Administered Medications  Medication Dose Route Frequency Provider Last Rate Last Dose  . 0.9 %  sodium chloride infusion   Intravenous Continuous Conrad Veguita, MD      . carvedilol (COREG) tablet 25 mg  25 mg Oral BID WC Alvia Grove, PA-C      . Chlorhexidine Gluconate Cloth 2 % PADS 6 each  6 each Topical Once Conrad [place], MD      . hydrALAZINE (APRESOLINE) injection 5 mg  5 mg Intravenous Q20 Min PRN Alvia Grove, PA-C      . hydrALAZINE (APRESOLINE) tablet 100 mg  100 mg Oral TID Alvia Grove, PA-C      . HYDROmorphone (DILAUDID) 1 MG/ML injection           . HYDROmorphone (DILAUDID) injection 0.25-0.5 mg  0.25-0.5 mg  Intravenous Q5 min PRN Albertha Ghee, MD   1 mg at 12/11/16 1407  . labetalol (NORMODYNE,TRANDATE) injection 10 mg  10 mg Intravenous Q10 min PRN Alvia Grove, PA-C      . losartan (COZAAR) tablet 100 mg  100 mg Oral Daily Alvia Grove, PA-C      . metoprolol (LOPRESSOR) injection 2-5 mg  2-5 mg Intravenous Q2H PRN Alvia Grove, PA-C      . ondansetron (ZOFRAN) injection 4 mg  4 mg Intravenous Q6H PRN Albertha Ghee, MD      . oxyCODONE (Oxy IR/ROXICODONE) immediate release tablet 5 mg  5 mg Oral Once PRN Albertha Ghee, MD       Or  . oxyCODONE (ROXICODONE) 5 MG/5ML solution 5 mg  5 mg Oral Once PRN Albertha Ghee, MD      . oxyCODONE-acetaminophen (PERCOCET/ROXICET) 5-325 MG per tablet 1-2 tablet  1-2 tablet Oral Q4H PRN Alvia Grove, PA-C       Labs: Basic Metabolic Panel:  Recent Labs Lab 12/11/16 0729  NA 140  K 4.4  GLUCOSE 84  CBC:  Recent Labs Lab 12/11/16 0729 12/11/16 1335  WBC  --  4.4  HGB 9.5* 6.6*  HCT 28.0* 20.6*  MCV  --  94.5  PLT  --  143*   ROS: As per HPI. Unable to answer questions due to sedation Physical Exam: Vitals:   12/11/16 0726 12/11/16 1340 12/11/16 1405 12/11/16 1415  BP:  (!) 91/49 (!) 104/54   Pulse:  67 70 (!) 59  Resp:  (!) 24 16 15   Temp:  98 F (36.7 C)    TempSrc:      SpO2:  100% 99% 97%  Weight:      Height: 5\' 7"  (1.702 m)        General:  Thin AA male Head: barely can keep eyes open and falls back asleep Neck: Supple.  Lungs: CTA bilaterally without wheezes, rales, or rhonchi anteriorly Breathing is unlabored. Heart: RRR with S1 S2.  Abdomen: soft NT + BS Lower extremities:without edema  Neuro: sedated.cannot answer questions Dialysis Access: right arm AVGG wrapped right IJ  Dialysis Orders: [facility] TTS 3.5 hours EDW 61 right IJ 2 K 2.5 Ca 400/800 EDW 61 Hect 1 venofer 50 per week Mircera 30 q 4 last given 2 /1 Recent labs: hgb 9.5 82% sat (still on venofer but give ABLA, may continue) USUAL pre HD BP are ~  200s/120 with post BP 190/100s  Assessment/Plan: 1. s/p left arm angiogram with resection of brachial pseudoaneurysm, repair  of brachial artery with interposition graft; PTA of 75%  left  subclavian artery and thrombectomy of left brachial and ulnar arters- per Dr. Bridgett Larsson - I suspect pain control will be an issue for him 2. ESRD -  TTS - plan HD first round Thursday no heparin K 4.4 pre surgery 3. Hypertension/volume  - BP dramatically lower than outpt BPs which are totally out of control- gets to edw easily; BP low last admission and back to markedly elevated BP at his outpt center which suggests that he does not take meds as Rx. 4. Acute blood loss anemia superimposed upon Anemia  hgb pre HD 9s down to 6.6 post op with 900 ml blood loss; plan transfuse one unit today and another on HD tomorrow; recheck CBC pre HD and if need be, we can transfuse more on HD; no ESA/Fe yet 5. Metabolic bone disease -  Continue Hect and binders 6. Nutrition - renal diet/vit when eating 7. Adrenal insufficiency - d/c last admission on 15 mg q am and 5 mg q pm on hydrocortisone.  Myriam Jacobson, PA-C [place] [phone] 12/11/2016, 2:46 PM   Pt seen, examined and agree w A/P as above.  Kelly Splinter MD [employer] pager [phone]   12/11/2016, 4:09 PM

## 2016-12-11 NOTE — Transfer of Care (Signed)
Immediate Anesthesia Transfer of Care Note  Patient: Marvin Roberson  Procedure(s) Performed: Procedure(s): LEFT ARM ANGIOGRAM (Left) RESECTION  BRACHIAL ARTERY (Left) LEFT ARM BRACHIAL ARTERY WITH INTERPOSTIONAL GRAFT (Left) ANGIOPLASTY LEFT ARM & SUBCLAVIAN ARTERY (Left) THROMBECTOMY BRACHIAL ARTERY AND ULNAR  Patient Location: PACU  Anesthesia Type:General  Level of Consciousness: awake, alert  and oriented  Airway & Oxygen Therapy: Patient Spontanous Breathing and Patient connected to nasal cannula oxygen  Post-op Assessment: Report given to RN, Post -op Vital signs reviewed and stable and Patient moving all extremities X 4  Post vital signs: Reviewed and stable  Last Vitals:  Vitals:   12/11/16 0712  BP: 138/68  Pulse: 69  Resp: 16  Temp: 36.6 C    Last Pain:  Vitals:   12/11/16 0726  TempSrc:   PainSc: 10-Worst pain ever      Patients Stated Pain Goal: 3 (37/04/88 8916)  Complications: No apparent anesthesia complications

## 2016-12-12 ENCOUNTER — Encounter (HOSPITAL_COMMUNITY): Payer: Self-pay | Admitting: Vascular Surgery

## 2016-12-12 DIAGNOSIS — Z8673 Personal history of transient ischemic attack (TIA), and cerebral infarction without residual deficits: Secondary | ICD-10-CM

## 2016-12-12 DIAGNOSIS — I721 Aneurysm of artery of upper extremity: Secondary | ICD-10-CM

## 2016-12-12 DIAGNOSIS — Z888 Allergy status to other drugs, medicaments and biological substances status: Secondary | ICD-10-CM

## 2016-12-12 DIAGNOSIS — F17210 Nicotine dependence, cigarettes, uncomplicated: Secondary | ICD-10-CM

## 2016-12-12 DIAGNOSIS — N186 End stage renal disease: Secondary | ICD-10-CM

## 2016-12-12 DIAGNOSIS — I729 Aneurysm of unspecified site: Secondary | ICD-10-CM

## 2016-12-12 DIAGNOSIS — Z992 Dependence on renal dialysis: Secondary | ICD-10-CM

## 2016-12-12 LAB — BASIC METABOLIC PANEL
Anion gap: 13 (ref 5–15)
BUN: 45 mg/dL — ABNORMAL HIGH (ref 6–20)
CALCIUM: 6.3 mg/dL — AB (ref 8.9–10.3)
CO2: 21 mmol/L — AB (ref 22–32)
CREATININE: 9.31 mg/dL — AB (ref 0.61–1.24)
Chloride: 104 mmol/L (ref 101–111)
GFR calc Af Amer: 7 mL/min — ABNORMAL LOW (ref >60)
GFR calc non Af Amer: 6 mL/min — ABNORMAL LOW (ref >60)
GLUCOSE: 77 mg/dL (ref 65–99)
Potassium: 5.1 mmol/L (ref 3.5–5.1)
Sodium: 138 mmol/L (ref 135–145)

## 2016-12-12 LAB — CBC
HCT: 21.9 % — ABNORMAL LOW (ref 39.0–52.0)
Hemoglobin: 7.1 g/dL — ABNORMAL LOW (ref 13.0–17.0)
MCH: 30.5 pg (ref 26.0–34.0)
MCHC: 32.4 g/dL (ref 30.0–36.0)
MCV: 94 fL (ref 78.0–100.0)
Platelets: 103 10*3/uL — ABNORMAL LOW (ref 150–400)
RBC: 2.33 MIL/uL — ABNORMAL LOW (ref 4.22–5.81)
RDW: 18 % — AB (ref 11.5–15.5)
WBC: 7.1 10*3/uL (ref 4.0–10.5)

## 2016-12-12 LAB — POCT I-STAT 4, (NA,K, GLUC, HGB,HCT)
Glucose, Bld: 100 mg/dL — ABNORMAL HIGH (ref 65–99)
HCT: 20 % — ABNORMAL LOW (ref 39.0–52.0)
Hemoglobin: 6.8 g/dL — CL (ref 13.0–17.0)
Potassium: 6 mmol/L — ABNORMAL HIGH (ref 3.5–5.1)
Sodium: 137 mmol/L (ref 135–145)

## 2016-12-12 LAB — PREPARE RBC (CROSSMATCH)

## 2016-12-12 MED ORDER — VANCOMYCIN HCL IN DEXTROSE 750-5 MG/150ML-% IV SOLN
INTRAVENOUS | Status: AC
  Filled 2016-12-12: qty 150

## 2016-12-12 MED ORDER — HYDROCORTISONE 5 MG PO TABS
5.0000 mg | ORAL_TABLET | Freq: Every evening | ORAL | Status: DC
  Filled 2016-12-12 (×2): qty 1

## 2016-12-12 MED ORDER — HYDROMORPHONE HCL 1 MG/ML IJ SOLN
1.0000 mg | INTRAMUSCULAR | Status: DC | PRN
  Administered 2016-12-12 – 2016-12-13 (×6): 2 mg via INTRAVENOUS
  Administered 2016-12-13 (×2): 1 mg via INTRAVENOUS
  Filled 2016-12-12 (×5): qty 2
  Filled 2016-12-12: qty 1
  Filled 2016-12-12: qty 2

## 2016-12-12 MED ORDER — SODIUM CHLORIDE 0.9 % IV SOLN: 100.0000 mL | INTRAVENOUS | Status: DC | PRN

## 2016-12-12 MED ORDER — SODIUM CHLORIDE 0.9 % IV SOLN: Freq: Once | INTRAVENOUS | Status: DC

## 2016-12-12 MED ORDER — HYDROCORTISONE 5 MG PO TABS
15.0000 mg | ORAL_TABLET | Freq: Every day | ORAL | Status: DC
  Administered 2016-12-12 – 2016-12-13 (×2): 15 mg via ORAL
  Filled 2016-12-12 (×2): qty 1

## 2016-12-12 MED ORDER — HYDROMORPHONE HCL 1 MG/ML IJ SOLN
INTRAMUSCULAR | Status: AC
  Filled 2016-12-12: qty 2

## 2016-12-12 MED ORDER — PENTAFLUOROPROP-TETRAFLUOROETH EX AERO: 1.0000 "application " | INHALATION_SPRAY | CUTANEOUS | Status: DC | PRN

## 2016-12-12 MED ORDER — LIDOCAINE HCL (PF) 1 % IJ SOLN: 5.0000 mL | INTRAMUSCULAR | Status: DC | PRN

## 2016-12-12 MED ORDER — LIDOCAINE-PRILOCAINE 2.5-2.5 % EX CREA
1.0000 "application " | TOPICAL_CREAM | CUTANEOUS | Status: DC | PRN
  Filled 2016-12-12: qty 5

## 2016-12-12 MED ORDER — ALTEPLASE 2 MG IJ SOLR: 2.0000 mg | Freq: Once | INTRAMUSCULAR | Status: DC | PRN

## 2016-12-12 MED ORDER — DOXERCALCIFEROL 4 MCG/2ML IV SOLN
INTRAVENOUS | Status: AC
  Filled 2016-12-12: qty 2

## 2016-12-12 MED ORDER — CEFTAZIDIME 2 G IJ SOLR
2.0000 g | INTRAMUSCULAR | Status: DC
  Administered 2016-12-12: 2 g via INTRAVENOUS
  Filled 2016-12-12: qty 2

## 2016-12-12 NOTE — Progress Notes (Signed)
[facility] Progress Note   Subjective: upset about losing money, refused HD this am, may agree to do later today. Wants reg diet, "I've been doing this 20 yrs"..., and IV pain meds wearing off about 90 min  Vitals:   12/11/16 2019 12/11/16 2107 12/11/16 2350 12/12/16 0451  BP: (!) 113/59 (!) 119/51 122/61 124/73  Pulse: 62 (!) 57 64 (!) 57  Resp: 18 18 18 18   Temp: 97.5 F (36.4 C) 97.6 F (36.4 C) 97.8 F (36.6 C) 97.9 F (36.6 C)  TempSrc:    Oral  SpO2: 98% 97% 97% 98%  Weight:      Height:        Inpatient medications: . sodium chloride   Intravenous Once  . sodium chloride   Intravenous Once  . aspirin EC  81 mg Oral Daily  . atorvastatin  40 mg Oral Daily  . calcium acetate  667 mg Oral TID WC  . carvedilol  25 mg Oral BID WC  . cefTAZidime (FORTAZ)  IV  2 g Intravenous Q T,Th,Sa-HD  . docusate sodium  100 mg Oral Daily  . doxercalciferol  1 mcg Intravenous Q T,Th,Sa-HD  . heparin  5,000 Units Subcutaneous Q8H  . hydrALAZINE  100 mg Oral TID  . hydrocortisone  45 mg Oral BH-q7a  . hydrocortisone  5 mg Oral QPM  . losartan  100 mg Oral Daily  . pantoprazole  40 mg Oral Daily  . sodium chloride flush  3 mL Intravenous Q12H  . vancomycin  750 mg Intravenous Q T,Th,Sa-HD    sodium chloride, sodium chloride, sodium chloride, acetaminophen **OR** acetaminophen, alteplase, alum & mag hydroxide-simeth, bisacodyl, guaiFENesin-dextromethorphan, hydrALAZINE, HYDROmorphone (DILAUDID) injection, labetalol, lidocaine (PF), lidocaine-prilocaine, metoprolol, ondansetron, oxyCODONE-acetaminophen, pentafluoroprop-tetrafluoroeth, phenol, sodium chloride flush  Exam: General:  Thin AA male, alert, nad Neck: Supple.  Lungs: CTA bilaterally without wheezes, rales, or rhonchi anteriorly Breathing is unlabored. Heart: RRR with S1 S2.  Abdomen: soft NT + BS Lower extremities:without edema , LUE in ace wrap Neuro: nf, Ox3 Dialysis Access: R arm AVGG / right  IJ  Dialysis Orders: [facility] TTS 3.5 hours EDW 61 right IJ 2 K 2.5 Ca 400/800 EDW 61 Hect 1 venofer 50 per week Mircera 30 q 4 last given 2 /1 Recent labs: hgb 9.5 82% sat (still on venofer but give ABLA, may continue) USUAL pre HD BP are ~ 200s/120 with post BP 190/100s  Assessment: 1. s/p left arm angiogram with resection of brachial pseudoaneurysm, repair of brachial artery with interposition graft; PTA of 75%  left  subclavian artery and thrombectomy of left brachial and ulnar arters- per Dr. Bridgett Larsson - 2. ESRD -  TTS - as above, may agree to HD later today 3. Hypertension/volume  - BP's unusually low for this patient.  4. Anemia CKD + ABL - from surgery, got one unit yest, another today on HD. no ESA/Fe yet 5. Metabolic bone disease -  Continue Hect and binders 6. Nutrition - renal diet/vit when eating 7. Adrenal insufficiency - d/c last admission on 15 mg q am and 5 mg q pm on hydrocortisone. 8. Nutirtion - changed to reg diet 9. Hyperkalemia - mild    Plan - as above, ^'d dilaudid 1-2 mg q 3hr prn   Kelly Splinter MD [facility] pager [phone]   12/12/2016, 8:54 AM    Recent Labs Lab 12/11/16 1255 12/11/16 1431 12/11/16 1527 12/12/16 0334  NA 137 139  --  138  K 6.0*  5.4*  --  5.1  CL  --  109  --  104  CO2  --  22  --  21*  GLUCOSE 100* 106*  --  77  BUN  --  37*  --  45*  CREATININE  --  7.99* 8.12* 9.31*  CALCIUM  --  6.2*  --  6.3*    Recent Labs Lab 12/11/16 1431  AST 18  ALT 13*  ALKPHOS 45  BILITOT 0.3  PROT 5.8*  ALBUMIN 2.5*    Recent Labs Lab 12/11/16 1335 12/11/16 1527 12/12/16 0334  WBC 4.4 5.0 7.1  HGB 6.6* 6.4* 7.1*  HCT 20.6* 19.9* 21.9*  MCV 94.5 94.3 94.0  PLT 143* 121* 103*   Iron/TIBC/Ferritin/ %Sat No results found for: IRON, TIBC, FERRITIN, IRONPCTSAT

## 2016-12-12 NOTE — Progress Notes (Signed)
Patient returned from dialysis without completing his treatment stating that he had money missing. Upon entering patient's room, he was noted to be agitated, walking around his bed and looking through his clothing and sheets. He said that when he arrived to dialysis a staff member moved his jacked and money fell out of the pockets. He stated that he had "over 300 dollars" missing, and declined to state the actual amount, but did state that the total amount of cash he had on him at the time of admission was over $1000. When asked why he did not disclose this when he arrived so we could keep a record of it in his chart he stated that it was none of our business, and it in his bag and "out of sight out of mind." I then asked him if he would like me to call security so that he could report the missing money. The patient did not respond, but just sat quietly on the edge of the bed. I then asked if there was anything I could do to help. Again he did not respond. I then said that the patient could let me know if there was anything I could do and I left the room. The charge nurse was made aware of the situation.

## 2016-12-12 NOTE — Progress Notes (Addendum)
[facility] [place]  Subjective  - Up set he has lost 300 dollars in cash since HD this am.   Objective 124/73 (!) 57 97.9 F (36.6 C) (Oral) 18 98%  Intake/Output Summary (Last 24 hours) at 12/12/16 0749 Last data filed at 12/12/16 0409  Gross per 24 hour  Intake             2870 ml  Output              950 ml  Net             1920 ml    Standing up going through his blankets using his left hand well without complaints of pain. Palpable radial pulse, sensation decreased secondary to median nerve injury prior to this surgery Dressing clean and dry, changed at bedside staples intact and minimal edema in the arm at incisions.  No edema in the left hand.   Assessment/Planning: POD # 1  PROCEDURE: 1. Right forearm arm exploration 2. Resection of brachial artery pseudoaneurysm 3. Repair of brachial artery with interposition graft (6 mm Artegraft) 4. Open cannulation of axillobrachial graft 5. Left arm angiogram 6. Left subclavian artery angioplasty (6 mm x 40 mm) 7. Thrombectomy of axillobrachial graft, left brachial artery and ulnar artery  will change dressing tomorrow  Pending cultures intra operative and blood cultures, Mycotic aneurysm verses contaminated specimen? Consult ID for assistance and recommendations Continue IV antibiotics until final cultures ceftazidime    and Vancomycin  Chronic anemia on HD HGB 7.1 asymptomatic Clarification of hydrocortisone dose to 15 mg in the am and 5 mg in the PM here and for his home dose.  Laurence Slate [facility] 12/12/2016 7:49 AM --  Laboratory Lab Results:  Recent Labs  12/11/16 1527 12/12/16 0334  WBC 5.0 7.1  HGB 6.4* 7.1*  HCT 19.9* 21.9*  PLT 121* 103*   BMET  Recent Labs  12/11/16 1431 12/11/16 1527 12/12/16 0334  NA 139  --  138  K 5.4*  --  5.1  CL 109  --  104  CO2 22  --  21*  GLUCOSE 106*  --  77  BUN 37*  --  45*  CREATININE 7.99* 8.12* 9.31*  CALCIUM 6.2*  --  6.3*     COAG Lab Results  Component Value Date   INR 1.22 11/27/2016   No results found for: PTT  Addendum  I have independently interviewed and examined the patient, and I agree with the physician assistant's findings.    Intraoperative findings are concerning for:  1. Proximal anastomotic stenosis on L ax-brachial BPG placed by [facility] 2. Antecubital brachial artery PSA: appearance c/w chronic PSA with some concern for possible mycotic origin   - Would get ID's input into this patient's case, specifically are the intraoperative gram statin suggestive of mycotic aneurysm and what abx regimen might be needed  - HD already arranged by Renal.  If pt refuses, he can be discharged to his routine outpatient dialysis center  - Pt will need to continue longitudinal care with [facility] as he will likely need a redo of the L ax-brachial bypass at some point, likely with deep vein conduit if he has inadequate GSV (likely as PTFE is not usually used in young patients for bypass)  - Deep vein harvest carried some significant risk for complications including venous gangrene, hence, has not been routinely done at [facility], thus I would refer this patient back to his tertiary care  facility to pursue this  - Overall, pt can be discharged if inpatient IV abx is NOT needed and after inpt HD is completed if the patient agrees to such  - Pt will likely need referral to a Pain Clinic as an outpatient, as his chronic pain was present prior to the surgery, likely due to a multiple etiology including prior nerve injury in the left arm   Adele Barthel, MD, FACS [facility] [place]: [phone] Pager: [phone]  12/12/2016, 9:59 AM

## 2016-12-12 NOTE — Consult Note (Signed)
Date of Admission:  12/11/2016  Date of Consult:  12/12/2016  Reason for Consult: mycotic aneurysm Referring Physician: Dr.Chen   HPI: Marvin Roberson is an 46 y.o. male. With ESRD on HD, adrenal insufficiency history of stroke who was admitted to vascular surgery sit service due to swelling of his left arm. He ended up undergoing right forearm exploration with resection of his brachial artery where he had a pseudoaneurysm. He had repair of his brachial artery with a graft cannulation of his axilla brachial graft left subclavian artery angioplasty and thrombectomy of axillobrachial graft left brachial artery and ulnar artery. Cultures were taken from the liquefied thrombus from the pseudoaneurysm as well as the pseudoaneurysm wall. These are now growing a gram-positive coccus and a gram-negative rod. Blood cultures have been sterile.  He has an HD catheter in his right chest.  He has been on vancomycin and Zosyn which has been changed to vancomycin and ceftazidime.     Past Medical History:  Diagnosis Date  . Adrenal insufficiency ([place])   . Dyspnea    "when I have too much fluid"  . ESRD on dialysis [facility]) since 1990s   "TTS; [address]" (11/27/2016)  . GERD (gastroesophageal reflux disease)   . High cholesterol   . History of blood transfusion   . Hypertension   . Kidney failure   . Stroke [facility]) 2016   decreased vision in his left eye/notes 11/27/2016    Past Surgical History:  Procedure Laterality Date  . ANGIOPLASTY Left 12/11/2016   Procedure: ANGIOPLASTY LEFT ARM & SUBCLAVIAN ARTERY;  Surgeon: Conrad Belleville, MD;  Location: [place];  Service: Vascular;  Laterality: Left;  . arm surgery Left 2016   "for aneurysm"  . AV FISTULA PLACEMENT    . FALSE ANEURYSM REPAIR Left 12/11/2016   Procedure: RESECTION  BRACHIAL ARTERY;  Surgeon: Conrad Westside, MD;  Location: [place];  Service: Vascular;  Laterality: Left;  . INSERTION OF DIALYSIS CATHETER Right    chest  . THROMBECTOMY  BRACHIAL ARTERY  12/11/2016   Procedure: THROMBECTOMY BRACHIAL ARTERY AND ULNAR;  Surgeon: Conrad Needville, MD;  Location: [place];  Service: Vascular;;  . THYROIDECTOMY    . UPPER EXTREMITY ANGIOGRAM Left 12/11/2016   Procedure: LEFT ARM ANGIOGRAM;  Surgeon: Conrad Saucier, MD;  Location: [place];  Service: Vascular;  Laterality: Left;  . WOUND EXPLORATION Left 12/11/2016   Procedure: LEFT ARM BRACHIAL ARTERY WITH INTERPOSTIONAL GRAFT;  Surgeon: Conrad , MD;  Location: [place];  Service: Vascular;  Laterality: Left;    Social History:  reports that he has been smoking Cigarettes.  He has a 2.40 pack-year smoking history. He has never used smokeless tobacco. He reports that he does not drink alcohol or use drugs.   History reviewed. No pertinent family history.  Allergies  Allergen Reactions  . Gabapentin Other (See Comments)    Sleepiness, altered mental state  . Lisinopril Other (See Comments)    angioedema  . Betadine [Povidone Iodine] Rash     Medications: I have reviewed patients current medications as documented in Epic Anti-infectives    Start     Dose/Rate Route Frequency Ordered Stop   12/12/16 1538  Vancomycin (VANCOCIN) 750-5 MG/150ML-% IVPB    Comments:  Kiang, Angelito   : cabinet override      12/12/16 1538 12/13/16 0344   12/12/16 1200  vancomycin (VANCOCIN) IVPB 750 mg/150 ml premix     750  mg 150 mL/hr over 60 Minutes Intravenous Every T-Th-Sa (Hemodialysis) 12/11/16 1544     12/12/16 1200  cefTAZidime (FORTAZ) 2 g in dextrose 5 % 50 mL IVPB     2 g 100 mL/hr over 30 Minutes Intravenous Every T-Th-Sa (Hemodialysis) 12/12/16 0824     12/12/16 0400  piperacillin-tazobactam (ZOSYN) IVPB 3.375 g  Status:  Discontinued     3.375 g 12.5 mL/hr over 240 Minutes Intravenous Every 12 hours 12/11/16 1544 12/12/16 0824   12/11/16 1700  vancomycin (VANCOCIN) 1,500 mg in sodium chloride 0.9 % 500 mL IVPB  Status:  Discontinued     1,500 mg 250 mL/hr over 120 Minutes Intravenous  Once  12/11/16 1544 12/12/16 0824   12/11/16 1630  piperacillin-tazobactam (ZOSYN) IVPB 3.375 g     3.375 g 100 mL/hr over 30 Minutes Intravenous  Once 12/11/16 1544 12/11/16 1732   12/11/16 0712  cefUROXime (ZINACEF) 1.5 g in dextrose 5 % 50 mL IVPB     1.5 g 100 mL/hr over 30 Minutes Intravenous 30 min pre-op 12/11/16 0712 12/11/16 0754         ROS: as in HPI otherwise remainder of 12 point Review of Systems is negative   Blood pressure 121/69, pulse 67, temperature 97.9 F (36.6 C), temperature source Oral, resp. rate 15, height '5\' 7"'$  (1.702 m), weight 136 lb 11 oz (62 kg), SpO2 98 %. General: Alert and awake, oriented x3, not in any acute distress. HEENT: anicteric sclera,  EOMI, oropharynx clear and without exudate Cardiovascular: regular rate, normal r,  no murmur rubs or gallops Pulmonary: clear to auscultation bilaterally, no wheezing, rales or rhonchi Gastrointestinal: soft nontender, nondistended, normal bowel sounds, Musculoskeletal: no  clubbing or edema noted bilaterally Skin, left arm with bandage right groin with surgical sites that are clean right chest with HD catheter in place, his feet do not have any infections or ulcers Neuro: nonfocal, strength and sensation intact   Results for orders placed or performed during the hospital encounter of 12/11/16 (from the past 48 hour(s))  I-STAT 4, (NA,K, GLUC, HGB,HCT)     Status: Abnormal   Collection Time: 12/11/16  7:29 AM  Result Value Ref Range   Sodium 140 135 - 145 mmol/L   Potassium 4.4 3.5 - 5.1 mmol/L   Glucose, Bld 84 65 - 99 mg/dL   HCT 28.0 (L) 39.0 - 52.0 %   Hemoglobin 9.5 (L) 13.0 - 17.0 g/dL  Aerobic Culture (superficial specimen)     Status: None (Preliminary result)   Collection Time: 12/11/16  9:56 AM  Result Value Ref Range   Specimen Description TISSUE    Special Requests PSEUDOANEURYSM POF ZINACEF    Gram Stain      FEW WBC PRESENT,BOTH PMN AND MONONUCLEAR NO ORGANISMS SEEN    Culture NO GROWTH  1 DAY    Report Status PENDING   Aerobic/Anaerobic Culture (surgical/deep wound)     Status: None (Preliminary result)   Collection Time: 12/11/16 10:13 AM  Result Value Ref Range   Specimen Description WOUND LEFT ARM    Special Requests      SWAB FROM LEFT ARM GRAFT PSEUDOANEURYSM POF ZINACEF   Gram Stain      RARE WBC PRESENT, PREDOMINANTLY PMN FEW GRAM NEGATIVE RODS RARE GRAM POSITIVE COCCI    Culture NO GROWTH 1 DAY    Report Status PENDING   I-STAT 4, (NA,K, GLUC, HGB,HCT)     Status: Abnormal   Collection Time: 12/11/16 12:55 PM  Result Value Ref Range   Sodium 137 135 - 145 mmol/L   Potassium 6.0 (H) 3.5 - 5.1 mmol/L   Glucose, Bld 100 (H) 65 - 99 mg/dL   HCT 20.0 (L) 39.0 - 52.0 %   Hemoglobin 6.8 (LL) 13.0 - 17.0 g/dL   Comment NOTIFIED PHYSICIAN   Type and screen Laura     Status: None (Preliminary result)   Collection Time: 12/11/16  1:05 PM  Result Value Ref Range   ISSUE DATE / TIME [id number]    Blood Product Unit Number [id number]    PRODUCT CODE [id number]    Unit Type and Rh 6200    Blood Product Expiration Date 201802202359    ISSUE DATE / TIME [id number]    Blood Product Unit Number [id number]    PRODUCT CODE [id number]    Unit Type and Rh 6200    Blood Product Expiration Date [id number]   ABO/Rh     Status: None   Collection Time: 12/11/16  1:05 PM  Result Value Ref Range   ABO/RH(D) A POS   CBC     Status: Abnormal   Collection Time: 12/11/16  1:35 PM  Result Value Ref Range   WBC 4.4 4.0 - 10.5 K/uL   RBC 2.18 (L) 4.22 - 5.81 MIL/uL   Hemoglobin 6.6 (LL) 13.0 - 17.0 g/dL    Comment: REPEATED TO VERIFY CRITICAL RESULT CALLED TO, READ BACK BY AND VERIFIED WITH: MARK BRANDE,RN AT 9323 12/11/2016 BY ZBEECH.    HCT 20.6 (L) 39.0 - 52.0 %   MCV 94.5 78.0 - 100.0 fL   MCH 30.3 26.0 - 34.0 pg   MCHC 32.0 30.0 - 36.0 g/dL   RDW 19.1 (H) 11.5 - 15.5 %   Platelets 143 (L) 150 - 400 K/uL  Prepare RBC (crossmatch)      Status: None   Collection Time: 12/11/16  2:30 PM  Result Value Ref Range   Order Confirmation ORDER PROCESSED BY BLOOD BANK   Comprehensive metabolic panel     Status: Abnormal   Collection Time: 12/11/16  2:31 PM  Result Value Ref Range   Sodium 139 135 - 145 mmol/L   Potassium 5.4 (H) 3.5 - 5.1 mmol/L    Comment: DELTA CHECK NOTED   Chloride 109 101 - 111 mmol/L   CO2 22 22 - 32 mmol/L   Glucose, Bld 106 (H) 65 - 99 mg/dL   BUN 37 (H) 6 - 20 mg/dL   Creatinine, Ser 7.99 (H) 0.61 - 1.24 mg/dL   Calcium 6.2 (LL) 8.9 - 10.3 mg/dL    Comment: CRITICAL RESULT CALLED TO, READ BACK BY AND VERIFIED WITH: L. MOFFITT RN [id number] GREEN R    Total Protein 5.8 (L) 6.5 - 8.1 g/dL   Albumin 2.5 (L) 3.5 - 5.0 g/dL   AST 18 15 - 41 U/L   ALT 13 (L) 17 - 63 U/L   Alkaline Phosphatase 45 38 - 126 U/L   Total Bilirubin 0.3 0.3 - 1.2 mg/dL   GFR calc non Af Amer 7 (L) >60 mL/min   GFR calc Af Amer 8 (L) >60 mL/min    Comment: (NOTE) The eGFR has been calculated using the CKD EPI equation. This calculation has not been validated in all clinical situations. eGFR's persistently <60 mL/min signify possible Chronic Kidney Disease.    Anion gap 8 5 - 15  Culture, blood (Routine X 2) w Reflex to ID Panel  Status: None (Preliminary result)   Collection Time: 12/11/16  3:25 PM  Result Value Ref Range   Specimen Description BLOOD RIGHT HAND    Special Requests BOTTLES DRAWN AEROBIC ONLY 5CC    Culture NO GROWTH < 24 HOURS    Report Status PENDING   CBC     Status: Abnormal   Collection Time: 12/11/16  3:27 PM  Result Value Ref Range   WBC 5.0 4.0 - 10.5 K/uL   RBC 2.11 (L) 4.22 - 5.81 MIL/uL   Hemoglobin 6.4 (LL) 13.0 - 17.0 g/dL    Comment: REPEATED TO VERIFY SPECIMEN CHECKED FOR CLOTS CRITICAL VALUE NOTED.  VALUE IS CONSISTENT WITH PREVIOUSLY REPORTED AND CALLED VALUE.    HCT 19.9 (L) 39.0 - 52.0 %   MCV 94.3 78.0 - 100.0 fL   MCH 30.3 26.0 - 34.0 pg   MCHC 32.2 30.0 - 36.0 g/dL    RDW 19.1 (H) 11.5 - 15.5 %   Platelets 121 (L) 150 - 400 K/uL  Creatinine, serum     Status: Abnormal   Collection Time: 12/11/16  3:27 PM  Result Value Ref Range   Creatinine, Ser 8.12 (H) 0.61 - 1.24 mg/dL   GFR calc non Af Amer 7 (L) >60 mL/min   GFR calc Af Amer 8 (L) >60 mL/min    Comment: (NOTE) The eGFR has been calculated using the CKD EPI equation. This calculation has not been validated in all clinical situations. eGFR's persistently <60 mL/min signify possible Chronic Kidney Disease.   Culture, blood (Routine X 2) w Reflex to ID Panel     Status: None (Preliminary result)   Collection Time: 12/11/16  3:30 PM  Result Value Ref Range   Specimen Description BLOOD RIGHT ARM    Special Requests BOTTLES DRAWN AEROBIC ONLY 5CC    Culture NO GROWTH < 24 HOURS    Report Status PENDING   Prepare RBC     Status: None   Collection Time: 12/11/16  7:54 PM  Result Value Ref Range   Order Confirmation BB SAMPLE OR UNITS ALREADY AVAILABLE   CBC     Status: Abnormal   Collection Time: 12/12/16  3:34 AM  Result Value Ref Range   WBC 7.1 4.0 - 10.5 K/uL   RBC 2.33 (L) 4.22 - 5.81 MIL/uL   Hemoglobin 7.1 (L) 13.0 - 17.0 g/dL   HCT 21.9 (L) 39.0 - 52.0 %   MCV 94.0 78.0 - 100.0 fL   MCH 30.5 26.0 - 34.0 pg   MCHC 32.4 30.0 - 36.0 g/dL   RDW 18.0 (H) 11.5 - 15.5 %   Platelets 103 (L) 150 - 400 K/uL    Comment: REPEATED TO VERIFY PLATELET COUNT CONFIRMED BY SMEAR   Basic metabolic panel     Status: Abnormal   Collection Time: 12/12/16  3:34 AM  Result Value Ref Range   Sodium 138 135 - 145 mmol/L   Potassium 5.1 3.5 - 5.1 mmol/L   Chloride 104 101 - 111 mmol/L   CO2 21 (L) 22 - 32 mmol/L   Glucose, Bld 77 65 - 99 mg/dL   BUN 45 (H) 6 - 20 mg/dL   Creatinine, Ser 9.31 (H) 0.61 - 1.24 mg/dL   Calcium 6.3 (LL) 8.9 - 10.3 mg/dL    Comment: CRITICAL RESULT CALLED TO, READ BACK BY AND VERIFIED WITH: I OSAGIE,RN [phone] WILDERK    GFR calc non Af Amer 6 (L) >60 mL/min   GFR  calc  Af Amer 7 (L) >60 mL/min    Comment: (NOTE) The eGFR has been calculated using the CKD EPI equation. This calculation has not been validated in all clinical situations. eGFR's persistently <60 mL/min signify possible Chronic Kidney Disease.    Anion gap 13 5 - 15  Prepare RBC     Status: None   Collection Time: 12/12/16  2:10 PM  Result Value Ref Range   Order Confirmation BB SAMPLE OR UNITS ALREADY AVAILABLE    '@BRIEFLABTABLE'$ (sdes,specrequest,cult,reptstatus)   ) Recent Results (from the past 720 hour(s))  Aerobic Culture (superficial specimen)     Status: None (Preliminary result)   Collection Time: 12/11/16  9:56 AM  Result Value Ref Range Status   Specimen Description TISSUE  Final   Special Requests PSEUDOANEURYSM POF ZINACEF  Final   Gram Stain   Final    FEW WBC PRESENT,BOTH PMN AND MONONUCLEAR NO ORGANISMS SEEN    Culture NO GROWTH 1 DAY  Final   Report Status PENDING  Incomplete  Aerobic/Anaerobic Culture (surgical/deep wound)     Status: None (Preliminary result)   Collection Time: 12/11/16 10:13 AM  Result Value Ref Range Status   Specimen Description WOUND LEFT ARM  Final   Special Requests   Final    SWAB FROM LEFT ARM GRAFT PSEUDOANEURYSM POF ZINACEF   Gram Stain   Final    RARE WBC PRESENT, PREDOMINANTLY PMN FEW GRAM NEGATIVE RODS RARE GRAM POSITIVE COCCI    Culture NO GROWTH 1 DAY  Final   Report Status PENDING  Incomplete  Culture, blood (Routine X 2) w Reflex to ID Panel     Status: None (Preliminary result)   Collection Time: 12/11/16  3:25 PM  Result Value Ref Range Status   Specimen Description BLOOD RIGHT HAND  Final   Special Requests BOTTLES DRAWN AEROBIC ONLY 5CC  Final   Culture NO GROWTH < 24 HOURS  Final   Report Status PENDING  Incomplete  Culture, blood (Routine X 2) w Reflex to ID Panel     Status: None (Preliminary result)   Collection Time: 12/11/16  3:30 PM  Result Value Ref Range Status   Specimen Description BLOOD RIGHT ARM   Final   Special Requests BOTTLES DRAWN AEROBIC ONLY 5CC  Final   Culture NO GROWTH < 24 HOURS  Final   Report Status PENDING  Incomplete     Impression/Recommendation  Active Problems:   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HCC)   Pal Shell is a 46 y.o. male with  End-stage renal disease on hemodialysis who underwent vascular surgery occluding resection of a pseudoaneurysm that is now been found to have organisms growing on culture raising concern for a mycotic aneurysm.  #1 Mycotic pseudoaneurysm: Given the positive cultures we have to presume that he indeed has an infected pseudoaneurysm:  Will use vancomycin and Ceptaz edema and for now which are easy to dose with dialysis. We will see if we can simple size antibiotics as cultures come back.  I would plan on giving him 6 weeks of antibiotics with dialysis and then transitioning and hopefully over to an oral suppressive regimen given the fact that there is graft in place near whether this intravascular infection was present.  Screening culture these had screening for hep C and HIV.   12/12/2016, 4:53 PM   Thank you so much for this interesting consult  [facility] for [place] [phone] (pager) [phone] (office) 12/12/2016, 4:53 PM  Rhina Brackett  [place] 12/12/2016, 4:53 PM

## 2016-12-13 ENCOUNTER — Encounter: Payer: Self-pay | Admitting: Vascular Surgery

## 2016-12-13 ENCOUNTER — Telehealth: Payer: Self-pay | Admitting: Vascular Surgery

## 2016-12-13 ENCOUNTER — Telehealth: Payer: Self-pay | Admitting: Nephrology

## 2016-12-13 DIAGNOSIS — R7881 Bacteremia: Secondary | ICD-10-CM | POA: Insufficient documentation

## 2016-12-13 LAB — TYPE AND SCREEN
Blood Product Expiration Date: 201802202359
Blood Product Expiration Date: 201802222359
ISSUE DATE / TIME: 201802072023
ISSUE DATE / TIME: 201802081538
UNIT TYPE AND RH: 6200
Unit Type and Rh: 6200

## 2016-12-13 LAB — AEROBIC CULTURE W GRAM STAIN (SUPERFICIAL SPECIMEN): Culture: NO GROWTH

## 2016-12-13 LAB — AEROBIC CULTURE  (SUPERFICIAL SPECIMEN)

## 2016-12-13 MED ORDER — DEXTROSE 5 % IV SOLN: 2.0000 g | INTRAVENOUS | Status: DC

## 2016-12-13 MED ORDER — OXYCODONE-ACETAMINOPHEN 5-325 MG PO TABS: 1.0000 | ORAL_TABLET | Freq: Four times a day (QID) | ORAL | 0 refills | Status: DC | PRN

## 2016-12-13 MED ORDER — VANCOMYCIN HCL IN DEXTROSE 750-5 MG/150ML-% IV SOLN: 750.0000 mg | INTRAVENOUS | Status: DC

## 2016-12-13 NOTE — Telephone Encounter (Signed)
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 Bridgett Larsson (MD only) in 2 weeks  Thanks Maudie Mercury

## 2016-12-13 NOTE — Progress Notes (Signed)
Patient refused  morning lab draw. Does not want to be disturbed or be touched with needle. All attempt failed.

## 2016-12-13 NOTE — Progress Notes (Signed)
Patient refuses Heparin SQ and does not want the medication. Voiced that he does not need it. Patient educated about the need for Heparin in preventing Deep Vein Thrombosis. Patient declined.

## 2016-12-13 NOTE — Telephone Encounter (Signed)
Spoke to pt on home # gave date also req letter be sent for post op 2/19

## 2016-12-13 NOTE — Progress Notes (Signed)
Patient continues to refuse telemetry and refusing PO pain medication. Only wants IV Dilaudid. Patient sleeps and wakes up and wants IV Dilaudid and then goes back to sleep.

## 2016-12-13 NOTE — Progress Notes (Signed)
Discharge instructions printed and reviewed with patient, and copy given for him to take home. All questions addressed at this time. New prescriptions reviewed and paper prescription for percocet given to be filled at patient's usual outpatient pharmacy. IV removed. Room searched for patient belongings, and confirmed with patient that all valuables were accounted for. Staff escorted patient to main entrance for discharge.

## 2016-12-13 NOTE — Progress Notes (Addendum)
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  Verified with pt he has baseline chronic pain in left arm  Appreciate ID's input.  ID confirms my clinical suspicion given the presence of GNR in the liquified thrombus in the brachial artery PSA  D/C today once abx regimen setup with Renal  Staples out in 2 weeks in the office   Short-term issues:  Possible bacteremia: 6 wk of abx during HD to try to sterile blood stream and graft  Pain issues: acute post-op pain regimen of narcotics will be issued but I recommended the pt consider chronic pain mgmt given the medicolegal issues currently with narcotics.  The patient refused such, as he reports he does not usually use narcotics except for short time periods.  Long-term issues:  Mycotic aneurysm: the prior stitches in this L brachial artery prove this is the same area as his prior brachial PSA.  This suggests that the original etiology of his axillary and brachial PSA might have been infectious.    Unfortunately, he may need long-term abx suppression given the presence of PTFE ax-brachial bypass in this L arm  PTFE ax-brachial bypass:   intraop angio demonstrated >75% stenosis of the proximal anastomosis.  This is not surprising given pt's young age and known increased neointimal hyperplasia rate in the younger patient population.    Essentially at some point this ax-brachial bypass is going to need to be replaced, as I suspect he will develop an ischemic left arm when the bypass occludes.  The lack of conduit is the problem.  On follow-up will have his GSV mapped to see if he has an acceptable conduit, but I suspect he does NOT have such as they would have used his GSV for the original  ax-brachial bypass.  I suspect his only remaining suitable conduit would be a deep vein from the leg.  Given the potential venous complications related to such, no deep vein harvest has been completed at [facility] for >6 years.    Subsequently, in the event, he needs reconstructions he will need to be refer to a tertiary center with experience with deep vein.  The patient refuses to go back to [facility], so would probably refer him to [facility].  I have made it clear to the patient that his is at risk of LEFT ARM LOSS if his L axillo-brachial bypass is loss whether due to thrombosis or infection.  He agrees to avoid competing with care in the short term.  Continuity of care:   Competition with care: Pt has not had a formal psychiatric evaluation but I suspect some personality disorders result in his competition with care.  During this admission, he has refused blood draws and hemodialysis.  During his outpatient visit, he was less that forthcoming with his medical history which I had to reconstruct from the EMR.  I have concerns that the patient will limit his medical options if not refuse interventions that will essential to his continued well being.  I have briefly discussed this with the patient, who is clearly overwhelmed at this point.  Variable geography: His complex medical care spans >3 states and the patient does not have records of all his care.  This makes maintain a coherent plan of care difficult.  Subjective  - 2 Days Post-Op  Pain better, refused AM blood draws, 2 u pRBC transfused so far   Objective Vitals:   12/12/16 1730 12/12/16 1745 12/12/16 2100 12/13/16 0452  BP: (!) 114/57 113/61 121/66 (!) 153/76  Pulse: 72 62 62 66  Resp: 18 18 18 18   Temp:  98.2 F (36.8 C) 98.1 F (36.7 C) 97.9 F (36.6 C)  TempSrc:  Oral Oral Oral  SpO2:  95% 100% 99%  Weight:  132 lb 4.4 oz (60 kg)    Height:         Intake/Output Summary (Last 24 hours) at 12/13/16 0838 Last data filed at  12/12/16 1745  Gross per 24 hour  Intake              335 ml  Output             2015 ml  Net            -1680 ml    PULM  CTAB CV  RRR GI  soft, NTND VASC  L arm appropriately TTP, intact unchanged hand grip 3/5, sensation unchanged, faintly palpable ulnar artery, inc x 3 c/d/i with no active bleeding currently, prior pulsatile mass resolved   Adele Barthel, MD, FACS [facility] Office: [phone] Pager: [phone]  12/13/2016, 8:38 AM

## 2016-12-13 NOTE — Progress Notes (Signed)
Subjective: No new complaints   Antibiotics:  Anti-infectives    Start     Dose/Rate Route Frequency Ordered Stop   12/14/16 1800  cefTAZidime (FORTAZ) 2 g in dextrose 5 % 50 mL IVPB     2 g 100 mL/hr over 30 Minutes Intravenous Every T-Th-Sa (1800) 12/13/16 0857     12/14/16 0000  cefTAZidime 2 g in dextrose 5 % 50 mL     2 g 100 mL/hr over 30 Minutes Intravenous Every T-Th-Sa (1800) 12/13/16 0954     12/14/16 0000  Vancomycin (VANCOCIN) 750-5 MG/150ML-% SOLN     750 mg 150 mL/hr over 60 Minutes Intravenous Every T-Th-Sa (Hemodialysis) 12/13/16 0954     12/12/16 1538  Vancomycin (VANCOCIN) 750-5 MG/150ML-% IVPB    Comments:  Kiang, Angelito   : cabinet override      12/12/16 1538 12/12/16 1715   12/12/16 1200  vancomycin (VANCOCIN) IVPB 750 mg/150 ml premix     750 mg 150 mL/hr over 60 Minutes Intravenous Every T-Th-Sa (Hemodialysis) 12/11/16 1544     12/12/16 1200  cefTAZidime (FORTAZ) 2 g in dextrose 5 % 50 mL IVPB  Status:  Discontinued     2 g 100 mL/hr over 30 Minutes Intravenous Every T-Th-Sa (Hemodialysis) 12/12/16 0824 12/13/16 0857   12/12/16 0400  piperacillin-tazobactam (ZOSYN) IVPB 3.375 g  Status:  Discontinued     3.375 g 12.5 mL/hr over 240 Minutes Intravenous Every 12 hours 12/11/16 1544 12/12/16 0824   12/11/16 1700  vancomycin (VANCOCIN) 1,500 mg in sodium chloride 0.9 % 500 mL IVPB  Status:  Discontinued     1,500 mg 250 mL/hr over 120 Minutes Intravenous  Once 12/11/16 1544 12/12/16 0824   12/11/16 1630  piperacillin-tazobactam (ZOSYN) IVPB 3.375 g     3.375 g 100 mL/hr over 30 Minutes Intravenous  Once 12/11/16 1544 12/11/16 1732   12/11/16 0712  cefUROXime (ZINACEF) 1.5 g in dextrose 5 % 50 mL IVPB     1.5 g 100 mL/hr over 30 Minutes Intravenous 30 min pre-op 12/11/16 0712 12/11/16 0754      Medications: Scheduled Meds: . sodium chloride   Intravenous Once  . sodium chloride   Intravenous Once  . sodium chloride   Intravenous Once  .  aspirin EC  81 mg Oral Daily  . atorvastatin  40 mg Oral Daily  . calcium acetate  667 mg Oral TID WC  . carvedilol  25 mg Oral BID WC  . [START ON 12/14/2016] cefTAZidime (FORTAZ)  IV  2 g Intravenous Q T,Th,Sat-1800  . docusate sodium  100 mg Oral Daily  . doxercalciferol  1 mcg Intravenous Q T,Th,Sa-HD  . heparin  5,000 Units Subcutaneous Q8H  . hydrALAZINE  100 mg Oral TID  . hydrocortisone  15 mg Oral Daily  . hydrocortisone  5 mg Oral QPM  . losartan  100 mg Oral Daily  . pantoprazole  40 mg Oral Daily  . sodium chloride flush  3 mL Intravenous Q12H  . vancomycin  750 mg Intravenous Q T,Th,Sa-HD   Continuous Infusions: PRN Meds:.sodium chloride, acetaminophen **OR** acetaminophen, alum & mag hydroxide-simeth, bisacodyl, guaiFENesin-dextromethorphan, hydrALAZINE, HYDROmorphone (DILAUDID) injection, labetalol, metoprolol, ondansetron, oxyCODONE-acetaminophen, phenol, sodium chloride flush    Objective: Weight change: -3 lb 5 oz (-1.504 kg)  Intake/Output Summary (Last 24 hours) at 12/13/16 1139 Last data filed at 12/13/16 0855  Gross per 24 hour  Intake  435 ml  Output             2015 ml  Net            -1580 ml   Blood pressure (!) 153/76, pulse 66, temperature 97.9 F (36.6 C), temperature source Oral, resp. rate 18, height 5\' 7"  (1.702 m), weight 132 lb 4.4 oz (60 kg), SpO2 99 %. Temp:  [97.5 F (36.4 C)-98.2 F (36.8 C)] 97.9 F (36.6 C) (02/09 0452) Pulse Rate:  [62-72] 66 (02/09 0452) Resp:  [15-20] 18 (02/09 0452) BP: (100-153)/(50-76) 153/76 (02/09 0452) SpO2:  [95 %-100 %] 99 % (02/09 0452) Weight:  [132 lb 4.4 oz (60 kg)-136 lb 11 oz (62 kg)] 132 lb 4.4 oz (60 kg) (02/08 1745)  Physical Exam: General: Alert and awake, oriented x3, not in any acute distress. HEENT: anicteric sclera,  EOMI,  Cardiovascular: regular rate, normal r,  no murmur rubs or gallops Pulmonary: clear to auscultation bilaterally, no wheezing, rales or  rhonchi Gastrointestinal: soft nontender, nondistended, normal bowel sounds, Musculoskeletal: no  clubbing or edema noted bilaterally Skin, left arm with bandage right groin with surgical sites that are clean right chest with HD catheter in place, his feet do not have any infections or ulcers Neuro: nonfocal, strength and sensation intact  CBC:  CBC Latest Ref Rng & Units 12/12/2016 12/11/2016 12/11/2016  WBC 4.0 - 10.5 K/uL 7.1 5.0 4.4  Hemoglobin 13.0 - 17.0 g/dL 7.1(L) 6.4(LL) 6.6(LL)  Hematocrit 39.0 - 52.0 % 21.9(L) 19.9(L) 20.6(L)  Platelets 150 - 400 K/uL 103(L) 121(L) 143(L)      BMET  Recent Labs  12/11/16 1431 12/11/16 1527 12/12/16 0334  NA 139  --  138  K 5.4*  --  5.1  CL 109  --  104  CO2 22  --  21*  GLUCOSE 106*  --  77  BUN 37*  --  45*  CREATININE 7.99* 8.12* 9.31*  CALCIUM 6.2*  --  6.3*     Liver Panel   Recent Labs  12/11/16 1431  PROT 5.8*  ALBUMIN 2.5*  AST 18  ALT 13*  ALKPHOS 45  BILITOT 0.3       Sedimentation Rate No results for input(s): ESRSEDRATE in the last 72 hours. C-Reactive Protein No results for input(s): CRP in the last 72 hours.  Micro Results: Recent Results (from the past 720 hour(s))  Aerobic Culture (superficial specimen)     Status: None (Preliminary result)   Collection Time: 12/11/16  9:56 AM  Result Value Ref Range Status   Specimen Description TISSUE  Final   Special Requests PSEUDOANEURYSM POF ZINACEF  Final   Gram Stain   Final    FEW WBC PRESENT,BOTH PMN AND MONONUCLEAR NO ORGANISMS SEEN    Culture NO GROWTH 1 DAY  Final   Report Status PENDING  Incomplete  Aerobic/Anaerobic Culture (surgical/deep wound)     Status: None (Preliminary result)   Collection Time: 12/11/16 10:13 AM  Result Value Ref Range Status   Specimen Description WOUND LEFT ARM  Final   Special Requests   Final    SWAB FROM LEFT ARM GRAFT PSEUDOANEURYSM POF ZINACEF   Gram Stain   Final    RARE WBC PRESENT, PREDOMINANTLY  PMN FEW GRAM NEGATIVE RODS RARE GRAM POSITIVE COCCI    Culture   Final    NO GROWTH 2 DAYS NO ANAEROBES ISOLATED; CULTURE IN PROGRESS FOR 5 DAYS   Report Status PENDING  Incomplete  Culture, blood (  Routine X 2) w Reflex to ID Panel     Status: None (Preliminary result)   Collection Time: 12/11/16  3:25 PM  Result Value Ref Range Status   Specimen Description BLOOD RIGHT HAND  Final   Special Requests BOTTLES DRAWN AEROBIC ONLY 5CC  Final   Culture NO GROWTH < 24 HOURS  Final   Report Status PENDING  Incomplete  Culture, blood (Routine X 2) w Reflex to ID Panel     Status: None (Preliminary result)   Collection Time: 12/11/16  3:30 PM  Result Value Ref Range Status   Specimen Description BLOOD RIGHT ARM  Final   Special Requests BOTTLES DRAWN AEROBIC ONLY 5CC  Final   Culture NO GROWTH < 24 HOURS  Final   Report Status PENDING  Incomplete    Studies/Results: No results found.    Assessment/Plan:  INTERVAL HISTORY:  Cultures without growth to date   Active Problems:   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HCC)   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HCC)   ESRD on hemodialysis (HCC)    Marvin Roberson is a 46 y.o. male with  End-stage renal disease on hemodialysis who underwent vascular surgery occluding resection of a pseudoaneurysm that is now been found to have organisms growing on culture raising concern for a mycotic aneurysm.  #1 Mycotic pseudoaneurysm:  Nothing is growing on culture yet.   If nothing grows on culture would go with vancomycin and ceftazidime 6 weeks and have him follow-up in our clinic to consider an oral suppressive antibiotic.  I'll arrange hospital follow-up the patient in follow-up his cultures.  Dr. Megan Salon will be available for questions this weekend.  LOS: 2 days   Alcide Evener 12/13/2016, 11:39 AM

## 2016-12-13 NOTE — Telephone Encounter (Signed)
Error

## 2016-12-13 NOTE — Progress Notes (Signed)
[facility] Progress Note   Subjective: had 3 hr HD yesterday.  K better.  Wound cx from OR had +gram stain but negative culture to date. Per ID rec Rx with 6 wks of IV abx (vanc/ fortaz) with OP HD, then may need suppressive abx.    Vitals:   12/12/16 1730 12/12/16 1745 12/12/16 2100 12/13/16 0452  BP: (!) 114/57 113/61 121/66 (!) 153/76  Pulse: 72 62 62 66  Resp: 18 18 18 18   Temp:  98.2 F (36.8 C) 98.1 F (36.7 C) 97.9 F (36.6 C)  TempSrc:  Oral Oral Oral  SpO2:  95% 100% 99%  Weight:  60 kg (132 lb 4.4 oz)    Height:        Inpatient medications: . sodium chloride   Intravenous Once  . sodium chloride   Intravenous Once  . sodium chloride   Intravenous Once  . aspirin EC  81 mg Oral Daily  . atorvastatin  40 mg Oral Daily  . calcium acetate  667 mg Oral TID WC  . carvedilol  25 mg Oral BID WC  . [START ON 12/14/2016] cefTAZidime (FORTAZ)  IV  2 g Intravenous Q T,Th,Sat-1800  . docusate sodium  100 mg Oral Daily  . doxercalciferol  1 mcg Intravenous Q T,Th,Sa-HD  . heparin  5,000 Units Subcutaneous Q8H  . hydrALAZINE  100 mg Oral TID  . hydrocortisone  15 mg Oral Daily  . hydrocortisone  5 mg Oral QPM  . losartan  100 mg Oral Daily  . pantoprazole  40 mg Oral Daily  . sodium chloride flush  3 mL Intravenous Q12H  . vancomycin  750 mg Intravenous Q T,Th,Sa-HD    sodium chloride, acetaminophen **OR** acetaminophen, alum & mag hydroxide-simeth, bisacodyl, guaiFENesin-dextromethorphan, hydrALAZINE, HYDROmorphone (DILAUDID) injection, labetalol, metoprolol, ondansetron, oxyCODONE-acetaminophen, phenol, sodium chloride flush  Exam: General:  Thin AA male, alert, nad Neck: Supple.  Lungs: CTA bilaterally without wheezes, rales, or rhonchi anteriorly Breathing is unlabored. Heart: RRR with S1 S2.  Abdomen: soft NT + BS Lower extremities:without edema , LUE in ace wrap Neuro: nf, Ox3 Dialysis Access: R arm AVGG / right IJ  Dialysis Orders: [facility]  3.5 hours EDW 61 right IJ 2 K 2.5 Ca 400/800 EDW 61 Hect 1 venofer 50 per week Mircera 30 q 4 last given 2 /1 Recent labs: hgb 9.5 82% sat (still on venofer but give ABLA, may continue) USUAL pre HD BP are ~ 200s/120 with post BP 190/100s  Assessment: 1. SP left arm angiogram with resection of brachial pseudoaneurysm, repair of brachial artery with interposition graft; PTA of 75%  left  subclavian artery and thrombectomy of left brachial and ulnar arters- possible brachial artery mycotic aneurysm, ID recommending 6 wks of IV vanc/ fortaz w HD the possible suppressive abx thereafter 2. ESRD -  TTS HD 3. Hypertension/volume  - BP's unusually low for this patient.  4. Anemia CKD + ABL - from surgery, got one unit yest, another today on HD. no ESA/Fe yet 5. Metabolic bone disease -  Continue Hect and binders 6. Nutrition - renal diet/vit when eating 7. Adrenal insufficiency - d/c last admission on 15 mg q am and 5 mg q pm on hydrocortisone.  8. Nutirtion - changed to reg diet 9. Hyperkalemia - mild    Plan - possible dc today, IV abx at OP HD as above   Kelly Splinter MD [place] pager [phone]   12/13/2016, 1:17 PM  Recent Labs Lab 12/11/16 1255 12/11/16 1431 12/11/16 1527 12/12/16 0334  NA 137 139  --  138  K 6.0* 5.4*  --  5.1  CL  --  109  --  104  CO2  --  22  --  21*  GLUCOSE 100* 106*  --  77  BUN  --  37*  --  45*  CREATININE  --  7.99* 8.12* 9.31*  CALCIUM  --  6.2*  --  6.3*    Recent Labs Lab 12/11/16 1431  AST 18  ALT 13*  ALKPHOS 45  BILITOT 0.3  PROT 5.8*  ALBUMIN 2.5*    Recent Labs Lab 12/11/16 1335 12/11/16 1527 12/12/16 0334  WBC 4.4 5.0 7.1  HGB 6.6* 6.4* 7.1*  HCT 20.6* 19.9* 21.9*  MCV 94.5 94.3 94.0  PLT 143* 121* 103*   Iron/TIBC/Ferritin/ %Sat No results found for: IRON, TIBC, FERRITIN, IRONPCTSAT

## 2016-12-14 DIAGNOSIS — N2581 Secondary hyperparathyroidism of renal origin: Secondary | ICD-10-CM | POA: Diagnosis not present

## 2016-12-14 DIAGNOSIS — D509 Iron deficiency anemia, unspecified: Secondary | ICD-10-CM | POA: Diagnosis not present

## 2016-12-14 DIAGNOSIS — D631 Anemia in chronic kidney disease: Secondary | ICD-10-CM | POA: Diagnosis not present

## 2016-12-14 DIAGNOSIS — R7881 Bacteremia: Secondary | ICD-10-CM | POA: Diagnosis not present

## 2016-12-14 DIAGNOSIS — N186 End stage renal disease: Secondary | ICD-10-CM | POA: Diagnosis not present

## 2016-12-15 NOTE — Discharge Summary (Signed)
[facility] Discharge Summary  Marvin Roberson 09-21-1971 46 y.o. male  [id number]  Admission Date: 12/11/2016  Discharge Date: 12/13/2016  Physician: Adele Barthel, MD  Admission Diagnosis: Left arm pseudoaneurysm I72.9; End stage renal disease N18.6  HPI:   This is a 46 y.o. male with complicated history of multiple access complications from an [facility] who presents for evaluation of a large pulsatile mass in left arm.  The patient has already had access procedures in both arms.  He last saw Dr. Rod Can at [facility] for a R RC AVF ligation.  He also had a L ax-brachial bypass with PTFE for PSA of L ax and brachial artery at [facility].  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  Hospital Course:  The patient was admitted to the hospital and taken to the operating room on 12/11/2016 and underwent:  1. Right forearm arm exploration 2. Resection of brachial artery pseudoaneurysm 3. Repair of brachial artery with interposition graft (6 mm Artegraft) 4. Open cannulation of axillobrachial graft 5. Left arm angiogram 6. Left subclavian artery angioplasty (6 mm x 40 mm) 7. Thrombectomy of axillobrachial graft, left brachial artery and ulnar artery 8. Exploration of left radial artery    The patient tolerated the procedure well and was transported to the PACU in stable condition.   Blood cultures were drawn and was started on vancomycin and zosyn empirically post-op.  Nephrology was consulted for dialysis needs. ID was consulted for mycotic aneurysm. His wound cultures grew some gram positive coccus and a gram negative rod. His antibiotics were changed to vancomycin and ceftazidime. ID recommended 6 weeks of IV antibiotics. These were to be given during dialysis.   Post-operatively, the patient's pain was better. He does have some chronic pain in the left arm at baseline. He ended his dialysis run early and refused blood draws.   From Dr. Lianne Moris progress note:      Short-term issues: ? Possible bacteremia: 6 wk of abx during HD to try to sterile blood stream and graft ? Pain issues: acute post-op pain regimen of narcotics will be issued but I recommended the pt consider chronic pain mgmt given the medicolegal issues currently with narcotics.  The patient refused such, as he reports he does not usually use narcotics except for short time periods.  Long-term issues: ? Mycotic aneurysm: the prior stitches in this L brachial artery prove this is the same area as his prior brachial PSA.  This suggests that the original etiology of his axillary and brachial PSA might have been infectious.    Unfortunately, he may need long-term abx suppression given the presence of PTFE ax-brachial bypass in this L arm ? PTFE ax-brachial bypass:   intraop angio demonstrated >75% stenosis of the proximal anastomosis.  This is not surprising given pt's young age and known increased neointimal hyperplasia rate in the younger patient population.    Essentially at some point this ax-brachial bypass is going to need to be replaced, as I suspect he will develop an ischemic left arm when the bypass occludes.  The lack of conduit is the problem.  On follow-up will have his GSV mapped to see if he has an acceptable conduit, but I suspect he does NOT have such as they would have used his GSV for the original ax-brachial bypass.  I suspect his only remaining suitable conduit would be a deep vein from the leg.  Given the potential venous complications related to such, no  deep vein harvest has been completed at [facility] for >6 years.    Subsequently, in the event, he needs reconstructions he will need to be refer to a tertiary center with experience with deep vein.  The patient refuses to go back to [facility], so would probably refer him to [facility].  I have made it clear to the patient that his is at risk of LEFT ARM LOSS if his L axillo-brachial bypass is loss whether due to thrombosis or  infection.  He agrees to avoid competing with care in the short term. ? Continuity of care:   Competition with care: Pt has not had a formal psychiatric evaluation but I suspect some personality disorders result in his competition with care.  During this admission, he has refused blood draws and hemodialysis.  During his outpatient visit, he was less that forthcoming with his medical history which I had to reconstruct from the EMR.  I have concerns that the patient will limit his medical options if not refuse interventions that will essential to his continued well being.  I have briefly discussed this with the patient, who is clearly overwhelmed at this point.  Variable geography: His complex medical care spans >3 states and the patient does not have records of all his care.  This makes maintain a coherent plan of care difficult.  He was discharged home on POD 2 in good condition.    CBC    Component Value Date/Time   WBC 7.1 12/12/2016 0334   RBC 2.33 (L) 12/12/2016 0334   HGB 7.1 (L) 12/12/2016 0334   HCT 21.9 (L) 12/12/2016 0334   PLT 103 (L) 12/12/2016 0334   MCV 94.0 12/12/2016 0334   MCH 30.5 12/12/2016 0334   MCHC 32.4 12/12/2016 0334   RDW 18.0 (H) 12/12/2016 0334   LYMPHSABS 0.8 11/27/2016 0932   MONOABS 0.3 11/27/2016 0932   EOSABS 0.1 11/27/2016 0932   BASOSABS 0.0 11/27/2016 0932    BMET    Component Value Date/Time   NA 138 12/12/2016 0334   K 5.1 12/12/2016 0334   CL 104 12/12/2016 0334   CO2 21 (L) 12/12/2016 0334   GLUCOSE 77 12/12/2016 0334   BUN 45 (H) 12/12/2016 0334   CREATININE 9.31 (H) 12/12/2016 0334   CALCIUM 6.3 (LL) 12/12/2016 0334   GFRNONAA 6 (L) 12/12/2016 0334   GFRAA 7 (L) 12/12/2016 0334     Discharge Instructions:   The patient is discharged to home with extensive instructions on wound care and progressive ambulation.  They are instructed not to drive or perform any heavy lifting until returning to see the physician in his  office.  Discharge Instructions    Call MD for:  redness, tenderness, or signs of infection (pain, swelling, bleeding, redness, odor or green/yellow discharge around incision site)    Complete by:  As directed    Call MD for:  severe or increased pain, loss or decreased feeling  in affected limb(s)    Complete by:  As directed    Call MD for:  temperature >100.5    Complete by:  As directed    Discharge wound care:    Complete by:  As directed    Wash left arm incisions daily with soap and water and pat dry. You can apply dry gauze to incisions and wrap with ACE wrap daily to reduce swelling.   Driving Restrictions    Complete by:  As directed    No driving while on pain medication  Increase activity slowly    Complete by:  As directed    Walk with assistance use walker or cane as needed   Lifting restrictions    Complete by:  As directed    No heavy lifting left arm for 2 weeks   Resume previous diet    Complete by:  As directed       Discharge Diagnosis:  Left arm pseudoaneurysm I72.9; End stage renal disease N18.6  Secondary Diagnosis: Patient Active Problem List   Diagnosis Date Noted  . 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[place])   . ESRD on hemodialysis ([place])   . 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[place]) 12/11/2016  . Adrenal crisis ([place])   . Hypoglycemia 11/27/2016  . ESRD (end stage renal disease) ([place]) 11/27/2016  . Flu-like symptoms 11/27/2016  . Nausea & vomiting 11/27/2016  . Diarrhea 11/27/2016  . Hypertension 11/27/2016  . History of CVA (cerebrovascular accident) 11/27/2016  . Monoclonal gammopathy of unknown significance (MGUS) 11/27/2016   Past Medical History:  Diagnosis Date  . Adrenal insufficiency ([place])   . Dyspnea    "when I have too much fluid"  . ESRD on dialysis [facility]) since 1990s   "TTS; [address]" (11/27/2016)  . GERD (gastroesophageal reflux disease)   . High cholesterol   . History of blood transfusion   . Hypertension   . Kidney failure   . Stroke  ([place]) 2016   decreased vision in his left eye/notes 11/27/2016     Allergies as of 12/13/2016      Reactions   Gabapentin Other (See Comments)   Sleepiness, altered mental state   Lisinopril Other (See Comments)   angioedema   Betadine [povidone Iodine] Rash      Medication List    TAKE these medications   aspirin EC 81 MG tablet Take 81 mg by mouth daily.   atorvastatin 40 MG tablet Commonly known as:  LIPITOR Take 40 mg by mouth daily.   carvedilol 25 MG tablet Commonly known as:  COREG Take 25 mg by mouth 2 (two) times daily with a meal.   cefTAZidime 2 g in dextrose 5 % 50 mL Inject 2 g into the vein every Tuesday, Thursday, and Saturday at 6 PM.   hydrALAZINE 100 MG tablet Commonly known as:  APRESOLINE Take 100 mg by mouth 3 (three) times daily.   hydrocortisone 5 MG tablet Commonly known as:  CORTEF Take 3 tablets (15 mg) every morning   hydrocortisone 5 MG tablet Commonly known as:  CORTEF Take 1 tablet (5 mg total) by mouth every evening.   losartan 100 MG tablet Commonly known as:  COZAAR Take 100 mg by mouth daily.   omeprazole 40 MG capsule Commonly known as:  PRILOSEC Take 40 mg by mouth daily.   oxyCODONE-acetaminophen 5-325 MG tablet Commonly known as:  PERCOCET/ROXICET Take 1-2 tablets by mouth every 6 (six) hours as needed for moderate pain.   Vancomycin 750-5 MG/150ML-% Soln Commonly known as:  VANCOCIN Inject 150 mLs (750 mg total) into the vein Every Tuesday,Thursday,and Saturday with dialysis.       Percocet #50 No Refill  Disposition: Home  Patient's condition: is Good  Follow up: 1. Dr. Bridgett Larsson in 2 weeks   Virgina Jock, PA-C [facility] [phone] 12/15/2016  12:35 PM  Addendum  I agree with my PA's discharge summary.  This patient has an extremely complicated medical history.  Essentially he presented with what was described as a relatively rapidly growing mass in his forearm.  On exploration, he  had a chronic pseudoaneurysm which was worrisome for possible mycotic aneurysm.  Unfortunately, he has a left ax-brachial bypass with PTFE placed by [facility].  I had previously recommended he follow up with them but he refused.  The patient underwent an interposition graft after resecting the destroyed segment of brachial artery and replacing the segment with an Artegraft.  Additionally, he has evidence of proximal graft stenosis that was managed with angioplasty. During his admission, he was somewhat uncooperative with his care, refusing blood draws and hemodialysis.  I have made it very clear to this patient that he is a risk for limb loss in the right ax-brachial PTFE BPG gets infected.  In such event, he likely will need a redo L ax-brachial bypass with deep vein, as I doubt he has adequate GSV.  As no attendings in our practice routinely use deep vein, I will refer him to [facility] for a second opinion after he heals up adequately from his procedure.  Upon discharge, he had a viable left hand with faintly palpable ulnar pulse.  The left hand continued to have significant motor loss from median nerve injury during his prior bypass.  The patient will follow up for staple removal in 2 weeks.  Meanwhile, he will continue with antibiotics during hemodialysis as recommended by ID.   Adele Barthel, MD, FACS [facility] [place] Office: [phone] Pager: [phone]  12/16/2016, 11:47 PM

## 2016-12-16 ENCOUNTER — Encounter: Payer: Self-pay | Admitting: Vascular Surgery

## 2016-12-16 LAB — CULTURE, BLOOD (ROUTINE X 2)
CULTURE: NO GROWTH
CULTURE: NO GROWTH

## 2016-12-16 LAB — AEROBIC/ANAEROBIC CULTURE (SURGICAL/DEEP WOUND)

## 2016-12-16 LAB — AEROBIC/ANAEROBIC CULTURE W GRAM STAIN (SURGICAL/DEEP WOUND): Culture: NO GROWTH

## 2016-12-17 ENCOUNTER — Observation Stay (HOSPITAL_COMMUNITY)
Admission: EM | Admit: 2016-12-17 | Discharge: 2016-12-18 | Disposition: A | Discharge status: Home / Self Care | Payer: Medicare Other | Attending: Surgery | Admitting: Surgery

## 2016-12-17 ENCOUNTER — Encounter (HOSPITAL_COMMUNITY): Payer: Self-pay | Admitting: Emergency Medicine

## 2016-12-17 ENCOUNTER — Emergency Department (HOSPITAL_COMMUNITY): Payer: Medicare Other

## 2016-12-17 DIAGNOSIS — D509 Iron deficiency anemia, unspecified: Secondary | ICD-10-CM | POA: Diagnosis not present

## 2016-12-17 DIAGNOSIS — Y832 Surgical operation with anastomosis, bypass or graft as the cause of abnormal reaction of the patient, or of later complication, without mention of misadventure at the time of the procedure: Secondary | ICD-10-CM | POA: Insufficient documentation

## 2016-12-17 DIAGNOSIS — E7800 Pure hypercholesterolemia, unspecified: Secondary | ICD-10-CM | POA: Insufficient documentation

## 2016-12-17 DIAGNOSIS — R609 Edema, unspecified: Secondary | ICD-10-CM

## 2016-12-17 DIAGNOSIS — D631 Anemia in chronic kidney disease: Secondary | ICD-10-CM | POA: Diagnosis not present

## 2016-12-17 DIAGNOSIS — Z7982 Long term (current) use of aspirin: Secondary | ICD-10-CM | POA: Diagnosis not present

## 2016-12-17 DIAGNOSIS — K219 Gastro-esophageal reflux disease without esophagitis: Secondary | ICD-10-CM | POA: Diagnosis not present

## 2016-12-17 DIAGNOSIS — I9789 Other postprocedural complications and disorders of the circulatory system, not elsewhere classified: Secondary | ICD-10-CM | POA: Diagnosis not present

## 2016-12-17 DIAGNOSIS — G8918 Other acute postprocedural pain: Secondary | ICD-10-CM

## 2016-12-17 DIAGNOSIS — Z8673 Personal history of transient ischemic attack (TIA), and cerebral infarction without residual deficits: Secondary | ICD-10-CM | POA: Insufficient documentation

## 2016-12-17 DIAGNOSIS — F17210 Nicotine dependence, cigarettes, uncomplicated: Secondary | ICD-10-CM | POA: Diagnosis not present

## 2016-12-17 DIAGNOSIS — Z992 Dependence on renal dialysis: Secondary | ICD-10-CM | POA: Diagnosis not present

## 2016-12-17 DIAGNOSIS — I120 Hypertensive chronic kidney disease with stage 5 chronic kidney disease or end stage renal disease: Secondary | ICD-10-CM | POA: Diagnosis not present

## 2016-12-17 DIAGNOSIS — R7881 Bacteremia: Secondary | ICD-10-CM | POA: Diagnosis not present

## 2016-12-17 DIAGNOSIS — T82898A Other specified complication of vascular prosthetic devices, implants and grafts, initial encounter: Principal | ICD-10-CM | POA: Insufficient documentation

## 2016-12-17 DIAGNOSIS — N186 End stage renal disease: Secondary | ICD-10-CM | POA: Diagnosis not present

## 2016-12-17 DIAGNOSIS — N2581 Secondary hyperparathyroidism of renal origin: Secondary | ICD-10-CM | POA: Diagnosis not present

## 2016-12-17 DIAGNOSIS — T819XXA Unspecified complication of procedure, initial encounter: Secondary | ICD-10-CM | POA: Diagnosis present

## 2016-12-17 DIAGNOSIS — M7989 Other specified soft tissue disorders: Secondary | ICD-10-CM | POA: Diagnosis not present

## 2016-12-17 HISTORY — DX: Disorder of kidney and ureter, unspecified: N28.9

## 2016-12-17 LAB — CBC WITH DIFFERENTIAL/PLATELET
Basophils Absolute: 0 10*3/uL (ref 0.0–0.1)
Basophils Relative: 0 %
EOS PCT: 2 %
Eosinophils Absolute: 0.1 10*3/uL (ref 0.0–0.7)
HCT: 22.4 % — ABNORMAL LOW (ref 39.0–52.0)
Hemoglobin: 7.3 g/dL — ABNORMAL LOW (ref 13.0–17.0)
LYMPHS ABS: 1 10*3/uL (ref 0.7–4.0)
Lymphocytes Relative: 19 %
MCH: 30.3 pg (ref 26.0–34.0)
MCHC: 32.6 g/dL (ref 30.0–36.0)
MCV: 92.9 fL (ref 78.0–100.0)
MONO ABS: 0.4 10*3/uL (ref 0.1–1.0)
MONOS PCT: 8 %
Neutro Abs: 3.7 10*3/uL (ref 1.7–7.7)
Neutrophils Relative %: 71 %
PLATELETS: 131 10*3/uL — AB (ref 150–400)
RBC: 2.41 MIL/uL — ABNORMAL LOW (ref 4.22–5.81)
RDW: 18.1 % — AB (ref 11.5–15.5)
WBC: 5.2 10*3/uL (ref 4.0–10.5)

## 2016-12-17 LAB — I-STAT CHEM 8, ED
BUN: 15 mg/dL (ref 6–20)
CALCIUM ION: 0.92 mmol/L — AB (ref 1.15–1.40)
CHLORIDE: 96 mmol/L — AB (ref 101–111)
CREATININE: 5.9 mg/dL — AB (ref 0.61–1.24)
GLUCOSE: 92 mg/dL (ref 65–99)
HCT: 24 % — ABNORMAL LOW (ref 39.0–52.0)
Hemoglobin: 8.2 g/dL — ABNORMAL LOW (ref 13.0–17.0)
POTASSIUM: 3.6 mmol/L (ref 3.5–5.1)
Sodium: 139 mmol/L (ref 135–145)
TCO2: 27 mmol/L (ref 0–100)

## 2016-12-17 MED ORDER — MORPHINE SULFATE (PF) 4 MG/ML IV SOLN
1.0000 mg | INTRAVENOUS | Status: DC | PRN
  Administered 2016-12-17 – 2016-12-18 (×2): 1 mg via INTRAVENOUS
  Filled 2016-12-17 (×2): qty 1

## 2016-12-17 MED ORDER — LOSARTAN POTASSIUM 50 MG PO TABS
100.0000 mg | ORAL_TABLET | Freq: Every day | ORAL | Status: DC
  Filled 2016-12-17: qty 2

## 2016-12-17 MED ORDER — HYDROCORTISONE 5 MG PO TABS: 5.0000 mg | ORAL_TABLET | Freq: Every evening | ORAL | Status: DC

## 2016-12-17 MED ORDER — VANCOMYCIN HCL IN DEXTROSE 750-5 MG/150ML-% IV SOLN: 750.0000 mg | INTRAVENOUS | Status: DC

## 2016-12-17 MED ORDER — IOPAMIDOL (ISOVUE-370) INJECTION 76%
100.0000 mL | Freq: Once | INTRAVENOUS | Status: AC | PRN
  Administered 2016-12-17: 100 mL via INTRAVENOUS

## 2016-12-17 MED ORDER — PANTOPRAZOLE SODIUM 40 MG PO TBEC: 40.0000 mg | DELAYED_RELEASE_TABLET | Freq: Every day | ORAL | Status: DC

## 2016-12-17 MED ORDER — HYDRALAZINE HCL 100 MG PO TABS: 100.0000 mg | ORAL_TABLET | Freq: Three times a day (TID) | ORAL | Status: DC

## 2016-12-17 MED ORDER — DEXTROSE 5 % IV SOLN: 2.0000 g | INTRAVENOUS | Status: DC

## 2016-12-17 MED ORDER — OXYCODONE HCL 5 MG PO TABS
5.0000 mg | ORAL_TABLET | Freq: Once | ORAL | Status: AC
  Administered 2016-12-17: 5 mg via ORAL
  Filled 2016-12-17: qty 1

## 2016-12-17 MED ORDER — OXYCODONE-ACETAMINOPHEN 5-325 MG PO TABS
1.0000 | ORAL_TABLET | Freq: Four times a day (QID) | ORAL | Status: DC | PRN
  Administered 2016-12-18: 2 via ORAL
  Filled 2016-12-17: qty 2

## 2016-12-17 MED ORDER — ASPIRIN EC 81 MG PO TBEC: 81.0000 mg | DELAYED_RELEASE_TABLET | Freq: Every day | ORAL | Status: DC

## 2016-12-17 MED ORDER — CARVEDILOL 12.5 MG PO TABS
25.0000 mg | ORAL_TABLET | Freq: Two times a day (BID) | ORAL | Status: DC
  Administered 2016-12-18: 25 mg via ORAL
  Filled 2016-12-17: qty 2

## 2016-12-17 MED ORDER — ATORVASTATIN CALCIUM 40 MG PO TABS
40.0000 mg | ORAL_TABLET | Freq: Every day | ORAL | Status: DC
  Filled 2016-12-17: qty 1

## 2016-12-17 MED ORDER — HYDROCORTISONE 5 MG PO TABS
15.0000 mg | ORAL_TABLET | Freq: Every day | ORAL | Status: DC
  Filled 2016-12-17: qty 1

## 2016-12-17 NOTE — ED Provider Notes (Signed)
As a painful left arm for the past 3 days. States his arm has been intermittently bleeding. On exam patient is alert and nontoxic appearing left upper extremity stapled surgical wound. No active bleeding. He is tender along suture line. Radial pulse 2+.   Orlie Dakin, MD 12/17/16 Joen Laura

## 2016-12-17 NOTE — ED Notes (Signed)
Pt. Refusing to take off pants.

## 2016-12-17 NOTE — ED Provider Notes (Signed)
[place] DEPT Provider Note   CSN: [id number] Arrival date & time: 12/17/16  1703     History   Chief Complaint Chief Complaint  Patient presents with  . Post-op Problem    HPI Dvonte Gatliff is a 46 y.o. male.  HPI Pt st's he had surg on left arm 1 week sgo   St's surg was done on old dialysis graft.  St's area has been oozing blood since surg.,  Pt had dialysis earlier today and was told to come to ED to have left arm checked.  Pt has not called surgeon who did his surg.  Upon my assessment, patient states more specifically that he noted the blood after each of his dialysis treatments. He goes to dialysis on Tuesday Thursday Saturday and noted that it was oozing on Saturday and today. He also has noted that it has gotten more swollen. He also states that his left arm feels slightly different than the right. He states that he has had some swelling that is worsening instead of improving. No purulent discharge, no fevers.  POD6: 1. Right forearm arm exploration 2. Resection of brachial artery pseudoaneurysm 3. Repair of brachial artery with interposition graft (6 mm Artegraft) 4. Open cannulation of axillobrachial graft 5. Left arm angiogram 6. Left subclavian artery angioplasty (6 mm x 40 mm) 7. Thrombectomy of axillobrachial graft,left brachial artery and ulnar artery 8. Exploration of left radial artery  Past Medical History:  Diagnosis Date  . Adrenal insufficiency ([place])   . Dyspnea    "when I have too much fluid"  . ESRD on dialysis [facility]) since 1990s   "TTS; [address]" (11/27/2016)  . GERD (gastroesophageal reflux disease)   . High cholesterol   . History of blood transfusion   . Hypertension   . Kidney failure   . Renal insufficiency   . Stroke [facility]) 2016   decreased vision in his left eye/notes 11/27/2016    Patient Active Problem List   Diagnosis Date Noted  . Post-operative complication 35/00/9381  . 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[place])   . ESRD on hemodialysis  ([place])   . 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 Adrenal crisis ([place])   . Hypoglycemia 11/27/2016  . ESRD (end stage renal disease) ([place]) 11/27/2016  . Flu-like symptoms 11/27/2016  . Nausea & vomiting 11/27/2016  . Diarrhea 11/27/2016  . Hypertension 11/27/2016  . History of CVA (cerebrovascular accident) 11/27/2016  . Monoclonal gammopathy of unknown significance (MGUS) 11/27/2016    Past Surgical History:  Procedure Laterality Date  . ANGIOPLASTY Left 12/11/2016   Procedure: ANGIOPLASTY LEFT ARM & SUBCLAVIAN ARTERY;  Surgeon: Conrad Moraga, MD;  Location: [place];  Service: Vascular;  Laterality: Left;  . arm surgery Left 2016   "for aneurysm"  . AV FISTULA PLACEMENT    . FALSE ANEURYSM REPAIR Left 12/11/2016   Procedure: RESECTION  BRACHIAL ARTERY;  Surgeon: Conrad Hart, MD;  Location: [place];  Service: Vascular;  Laterality: Left;  . INSERTION OF DIALYSIS CATHETER Right    chest  . THROMBECTOMY BRACHIAL ARTERY  12/11/2016   Procedure: THROMBECTOMY BRACHIAL ARTERY AND ULNAR;  Surgeon: Conrad [place], MD;  Location: [place];  Service: Vascular;;  . THYROIDECTOMY    . UPPER EXTREMITY ANGIOGRAM Left 12/11/2016   Procedure: LEFT ARM ANGIOGRAM;  Surgeon: Conrad Delton, MD;  Location: [place];  Service: Vascular;  Laterality: Left;  . WOUND EXPLORATION Left 12/11/2016   Procedure: LEFT ARM BRACHIAL ARTERY WITH INTERPOSTIONAL GRAFT;  Surgeon: Conrad [place], MD;  Location: [place];  Service: Vascular;  Laterality: Left;       Home Medications    Prior to Admission medications   Medication Sig Start Date End Date Taking? Authorizing Provider  aspirin EC 81 MG tablet Take 81 mg by mouth daily.    Historical Provider, MD  atorvastatin (LIPITOR) 40 MG tablet Take 40 mg by mouth daily.    Historical Provider, MD  carvedilol (COREG) 25 MG tablet Take 25 mg by mouth 2 (two) times daily with a meal.    Historical Provider, MD  cefTAZidime 2 g in dextrose 5 % 50 mL Inject 2 g into the vein  every Tuesday, Thursday, and Saturday at 6 PM. 12/14/16   Alvia Grove, PA-C  hydrALAZINE (APRESOLINE) 100 MG tablet Take 100 mg by mouth 3 (three) times daily.    Historical Provider, MD  hydrocortisone (CORTEF) 5 MG tablet Take 3 tablets (15 mg) every morning Patient taking differently: Take 15 mg by mouth daily.  11/29/16   Ledell Noss, MD  hydrocortisone (CORTEF) 5 MG tablet Take 1 tablet (5 mg total) by mouth every evening. 11/29/16   Ledell Noss, MD  losartan (COZAAR) 100 MG tablet Take 100 mg by mouth daily.    Historical Provider, MD  omeprazole (PRILOSEC) 40 MG capsule Take 40 mg by mouth daily.    Historical Provider, MD  oxyCODONE-acetaminophen (PERCOCET/ROXICET) 5-325 MG tablet Take 1-2 tablets by mouth every 6 (six) hours as needed for moderate pain. 12/13/16   Alvia Grove, PA-C  Vancomycin (VANCOCIN) 750-5 MG/150ML-% SOLN Inject 150 mLs (750 mg total) into the vein Every Tuesday,Thursday,and Saturday with dialysis. 12/14/16   Alvia Grove, PA-C    Family History No family history on file.  Social History Social History  Substance Use Topics  . Smoking status: Current Every Day Smoker    Packs/day: 0.12    Years: 20.00    Types: Cigarettes  . Smokeless tobacco: Never Used     Comment: 3-4 cigarettes per day.   . Alcohol use No     Allergies   Gabapentin; Lisinopril; and Betadine [povidone iodine]   Review of Systems Review of Systems  Constitutional: Negative for fever.  Allergic/Immunologic: Negative for immunocompromised state.  All other systems reviewed and are negative.    Physical Exam Updated Vital Signs BP 190/90 (BP Location: Right Leg)   Pulse 76   Temp 99.3 F (37.4 C) (Oral)   Resp 18   Ht 5\' 8"  (1.727 m)   Wt 61.5 kg   SpO2 100%   BMI 20.62 kg/m   Physical Exam  Constitutional: He appears well-developed and well-nourished. No distress.  HENT:  Head: Normocephalic and atraumatic.  Left Ear: External ear normal.  Eyes: Conjunctivae  are normal. Pupils are equal, round, and reactive to light. Right eye exhibits no discharge. Left eye exhibits no discharge.  Neck: Normal range of motion. Neck supple.  Cardiovascular: Normal rate and regular rhythm.   No murmur heard. Pulmonary/Chest: Effort normal and breath sounds normal. No respiratory distress.  Abdominal: Soft. Bowel sounds are normal. He exhibits no distension and no mass. There is no tenderness. There is no rebound and no guarding.  Musculoskeletal: Normal range of motion. He exhibits edema and tenderness.  Left upper extremity: Pulses are 1+ at radial and ulnar He has decreased sensation diffusely along entire hand Significant swelling of the entire hand Fluctuant mass that is not warm along the staples in  the forearm 2 large surgical sites approximately 20 cm and 10 cm each, with largest approximately Staples are hemostatic without discharge Compartments are soft  Neurological: He is alert.  Skin: Skin is warm. He is not diaphoretic.  Psychiatric: He has a normal mood and affect.     ED Treatments / Results  Labs (all labs ordered are listed, but only abnormal results are displayed) Labs Reviewed  CBC WITH DIFFERENTIAL/PLATELET - Abnormal; Notable for the following:       Result Value   RBC 2.41 (*)    Hemoglobin 7.3 (*)    HCT 22.4 (*)    RDW 18.1 (*)    Platelets 131 (*)    All other components within normal limits  I-STAT CHEM 8, ED - Abnormal; Notable for the following:    Chloride 96 (*)    Creatinine, Ser 5.90 (*)    Calcium, Ion 0.92 (*)    Hemoglobin 8.2 (*)    HCT 24.0 (*)    All other components within normal limits    EKG  EKG Interpretation None       Radiology No results found.  Procedures Procedures (including critical care time)  Medications Ordered in ED Medications  carvedilol (COREG) tablet 25 mg (not administered)  pantoprazole (PROTONIX) EC tablet 40 mg (not administered)  aspirin EC tablet 81 mg (not  administered)  losartan (COZAAR) tablet 100 mg (not administered)  atorvastatin (LIPITOR) tablet 40 mg (not administered)  hydrALAZINE (APRESOLINE) tablet 100 mg (not administered)  hydrocortisone (CORTEF) tablet 15 mg (not administered)  hydrocortisone (CORTEF) tablet 5 mg (not administered)  cefTAZidime (FORTAZ) 2 g in dextrose 5 % 50 mL IVPB (not administered)  vancomycin (VANCOCIN) IVPB 750 mg/150 ml premix (not administered)  oxyCODONE-acetaminophen (PERCOCET/ROXICET) 5-325 MG per tablet 1-2 tablet (not administered)  morphine 4 MG/ML injection 1 mg (1 mg Intravenous Given 12/17/16 2328)  oxyCODONE (Oxy IR/ROXICODONE) immediate release tablet 5 mg (5 mg Oral Given 12/17/16 1903)  iopamidol (ISOVUE-370) 76 % injection 100 mL (100 mLs Intravenous Contrast Given 12/17/16 2241)     Initial Impression / Assessment and Plan / ED Course  I have reviewed the triage vital signs and the nursing notes.  Pertinent labs & imaging results that were available during my care of the patient were reviewed by me and considered in my medical decision making (see chart for details).     Patient's hemoglobin is improved from baseline. Pulses present but very swollen and decreased. Vascular surgery consulted given concern and they ordered CT C/L extremity, which was reassuring per vascular. However, they recommend admission for observation/pain control.   Final Clinical Impressions(s) / ED Diagnoses   Final diagnoses:  Swelling  Post-operative pain    New Prescriptions New Prescriptions   No medications on file     Karma Greaser, MD 12/17/16 [place], MD 12/18/16 [phone]

## 2016-12-17 NOTE — Progress Notes (Addendum)
[facility] [place] [facility]:XNAT is a 46 y.o. male with complicated history of multiple access complications from an [facility] who presents for evaluation of a large pulsatile mass in left arm. The patient has already had access procedures in both arms. He last saw Dr. Rod Can at [facility] for a R RC AVF ligation. He also had a L ax-brachial bypass with PTFE for PSA of L ax and brachial artery at [facility]. 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  The patient was admitted to the hospital and taken to the operating room on 12/11/2016 and underwent:  1. Right forearm arm exploration 2. Resection of brachial artery pseudoaneurysm 3. Repair of brachial artery with interposition graft (6 mm Artegraft) 4. Open cannulation of axillobrachial graft 5. Left arm angiogram 6. Left subclavian artery angioplasty (6 mm x 40 mm) 7. Thrombectomy of axillobrachial graft,left brachial artery and ulnar artery 8. Exploration of left radial artery    He was discharged home 12/13/2016 on antibiotics for 6 weeks during HD to try to sterile blood stream and graft.  Pain issues: acute post-op pain regimen of narcotics will be issued but I recommended the pt consider chronic pain mgmt given the medicolegal issues currently with narcotics. The patient refused such, as he reports he does not usually use narcotics except for short time periods.  Subjective  - He reports to the ED with brachial incisional bleeding.  He has chronic pain in the left hand and is at baseline.     Objective 190/86 64 99.3 F (37.4 C) (Oral) 18 99% No intake or output data in the 24 hours ending 12/17/16 2050  Minimal active range of motion of the left hand, hand is warm to touch and has a faint palpable radial pulse.  Sensation is significantly decreased secondary to previous nerve damage from prior surgeries.  He is at base line. Brachial incision with edema and pain, minimal skin edges bleeding  SS drainage.  Assessment/Planning: S/P revision and repair brachial artery pseudoaneurysm, Repair of brachial artery with interposition graft (6 mm Artegraft, Thrombectomy of axillobrachial graft,left brachial artery and ulnar artery, and Left subclavian artery angioplasty (6 mm x 40 mm).  We will order a CTA of the left UE.    Laurence Slate [facility] 12/17/2016 8:50 PM --  Laboratory Lab Results:  Recent Labs  12/17/16 1839 12/17/16 1847  WBC 5.2  --   HGB 7.3* 8.2*  HCT 22.4* 24.0*  PLT 131*  --    BMET  Recent Labs  12/17/16 1847  NA 139  K 3.6  CL 96*  GLUCOSE 92  BUN 15  CREATININE 5.90*    COAG Lab Results  Component Value Date   INR 1.22 11/27/2016   No results found for: PTT   I agree with the above.  The patient is recently s/p left arm bypass by Dr. Bridgett Larsson.  He came to the ER after reporting bleeding from his incision.  Currently, there is no active bleeding.  I ordered a CTA.  After review of this study, I do not see any active extravasation or pseudoaneurysm.  There is a fluid collection around the incision which I assume is old hematoma, and the cause of his bleeding.  I do not see any reason for surgical exploration at this time.  I will admit him for observation.  Annamarie Major

## 2016-12-17 NOTE — ED Triage Notes (Signed)
Pt st's he had surg on left arm 1 week sgo   St's surg was done on old dialysis graft.  St's area has been oozing blood since surg.,  Pt had dialysis earlier today and was told to come to ED to have left arm checked.  Pt has not called surgeon who did his surg.

## 2016-12-18 DIAGNOSIS — T82898A Other specified complication of vascular prosthetic devices, implants and grafts, initial encounter: Secondary | ICD-10-CM | POA: Diagnosis not present

## 2016-12-18 MED ORDER — SODIUM CHLORIDE 0.9 % IV SOLN: 250.0000 mL | INTRAVENOUS | Status: DC | PRN

## 2016-12-18 MED ORDER — METOPROLOL TARTRATE 5 MG/5ML IV SOLN: 2.0000 mg | INTRAVENOUS | Status: DC | PRN

## 2016-12-18 MED ORDER — ALUM & MAG HYDROXIDE-SIMETH 200-200-20 MG/5ML PO SUSP: 15.0000 mL | ORAL | Status: DC | PRN

## 2016-12-18 MED ORDER — HYDRALAZINE HCL 20 MG/ML IJ SOLN: 5.0000 mg | INTRAMUSCULAR | Status: DC | PRN

## 2016-12-18 MED ORDER — SODIUM CHLORIDE 0.9% FLUSH: 3.0000 mL | Freq: Two times a day (BID) | INTRAVENOUS | Status: DC

## 2016-12-18 MED ORDER — ONDANSETRON HCL 4 MG/2ML IJ SOLN: 4.0000 mg | Freq: Four times a day (QID) | INTRAMUSCULAR | Status: DC | PRN

## 2016-12-18 MED ORDER — HYDRALAZINE HCL 25 MG PO TABS: 100.0000 mg | ORAL_TABLET | Freq: Three times a day (TID) | ORAL | Status: DC

## 2016-12-18 MED ORDER — POTASSIUM CHLORIDE CRYS ER 20 MEQ PO TBCR
20.0000 meq | EXTENDED_RELEASE_TABLET | Freq: Once | ORAL | Status: DC
  Filled 2016-12-18: qty 2

## 2016-12-18 MED ORDER — PHENOL 1.4 % MT LIQD: 1.0000 | OROMUCOSAL | Status: DC | PRN

## 2016-12-18 MED ORDER — DOCUSATE SODIUM 100 MG PO CAPS: 100.0000 mg | ORAL_CAPSULE | Freq: Two times a day (BID) | ORAL | Status: DC

## 2016-12-18 MED ORDER — GUAIFENESIN-DM 100-10 MG/5ML PO SYRP: 15.0000 mL | ORAL_SOLUTION | ORAL | Status: DC | PRN

## 2016-12-18 MED ORDER — LABETALOL HCL 5 MG/ML IV SOLN: 10.0000 mg | INTRAVENOUS | Status: DC | PRN

## 2016-12-18 MED ORDER — PANTOPRAZOLE SODIUM 40 MG PO TBEC: 40.0000 mg | DELAYED_RELEASE_TABLET | Freq: Every day | ORAL | Status: DC

## 2016-12-18 MED ORDER — SODIUM CHLORIDE 0.9% FLUSH: 3.0000 mL | INTRAVENOUS | Status: DC | PRN

## 2016-12-18 MED ORDER — ENOXAPARIN SODIUM 30 MG/0.3ML ~~LOC~~ SOLN
30.0000 mg | SUBCUTANEOUS | Status: DC
  Filled 2016-12-18: qty 0.3

## 2016-12-18 NOTE — Care Management Obs Status (Signed)
[place] NOTIFICATION   Patient Details  Name: Marvin Roberson MRN: [id number] Date of Birth: 07-28-1971   Medicare Observation Status Notification Given:  Yes    Vergie Living, RN 12/18/2016, 8:41 AM

## 2016-12-18 NOTE — ED Notes (Signed)
[facility] PA at bedside

## 2016-12-18 NOTE — ED Notes (Signed)
[facility] pa contacted at pt request

## 2016-12-18 NOTE — ED Notes (Addendum)
Pt states he understands instructions. Home stable with steady gait.[facility] PA aware of DC vitals.Pt given copy of AVS.

## 2016-12-18 NOTE — ED Notes (Signed)
Pt requesting sling. PA contacted for same.

## 2016-12-18 NOTE — ED Notes (Signed)
Pt awakes easily. NAD. Left arm propped on pillow with dressing in place and no bleeding noted around dressing.

## 2016-12-18 NOTE — Progress Notes (Signed)
[facility] [place]  Subjective  - Doing better.  Pain controlled with PO medications. Minimal incisional skin bleeding middle incision.   Objective 139/79 67 99.3 F (37.4 C) (Oral) 18 98% No intake or output data in the 24 hours ending 12/18/16 0836  Edema central brachial incision with minimal skin bleeding Palpable radial pulse faint left UE Active range of motion intact  CTA left UE The axillobrachial graft is discussed below in the Chest section. Its distal anastomosis is fully patent, with mild ectasia at the distal anastomosis.  The distal brachial artery remains fully patent, though somewhat displaced by soft tissue edema and mild soft tissue hemorrhage along the volar aspect of the forearm. Overlying skin staples are seen. Patent ulnar and interosseous arteries are noted. 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  Assessment/Planning: Post op skin bleeding and pain control issues  S/P revision Ax-brachial by pass with Artegraft Dry dressing over incision with ace wrap from finger tips to upper arm.  Elevation and active range of motion of hand to control edema.  No new prescriptions given.  Keep f/u appt. Next week with Dr. Bridgett Larsson.    Laurence Slate [facility] 12/18/2016 8:36 AM --  Laboratory Lab Results:  Recent Labs  12/17/16 1839 12/17/16 1847  WBC 5.2  --   HGB 7.3* 8.2*  HCT 22.4* 24.0*  PLT 131*  --    BMET  Recent Labs  12/17/16 1847  NA 139  K 3.6  CL 96*  GLUCOSE 92  BUN 15  CREATININE 5.90*    COAG Lab Results  Component Value Date   INR 1.22 11/27/2016   No results found for: PTT

## 2016-12-18 NOTE — ED Notes (Addendum)
Pt walking in room. Mod swelling noted under incision site at left forearm. Positive radial and ulnar pulses. Skin warm to touch. Pt request to speak with provide. Provider paged.

## 2016-12-19 DIAGNOSIS — D631 Anemia in chronic kidney disease: Secondary | ICD-10-CM | POA: Diagnosis not present

## 2016-12-19 DIAGNOSIS — N2581 Secondary hyperparathyroidism of renal origin: Secondary | ICD-10-CM | POA: Diagnosis not present

## 2016-12-19 DIAGNOSIS — R7881 Bacteremia: Secondary | ICD-10-CM | POA: Diagnosis not present

## 2016-12-19 DIAGNOSIS — N186 End stage renal disease: Secondary | ICD-10-CM | POA: Diagnosis not present

## 2016-12-19 DIAGNOSIS — D509 Iron deficiency anemia, unspecified: Secondary | ICD-10-CM | POA: Diagnosis not present

## 2016-12-21 DIAGNOSIS — N186 End stage renal disease: Secondary | ICD-10-CM | POA: Diagnosis not present

## 2016-12-21 DIAGNOSIS — D631 Anemia in chronic kidney disease: Secondary | ICD-10-CM | POA: Diagnosis not present

## 2016-12-21 DIAGNOSIS — N2581 Secondary hyperparathyroidism of renal origin: Secondary | ICD-10-CM | POA: Diagnosis not present

## 2016-12-21 DIAGNOSIS — R7881 Bacteremia: Secondary | ICD-10-CM | POA: Diagnosis not present

## 2016-12-21 DIAGNOSIS — D509 Iron deficiency anemia, unspecified: Secondary | ICD-10-CM | POA: Diagnosis not present

## 2016-12-23 ENCOUNTER — Encounter: Payer: Self-pay | Admitting: Vascular Surgery

## 2016-12-23 ENCOUNTER — Ambulatory Visit (INDEPENDENT_AMBULATORY_CARE_PROVIDER_SITE_OTHER): Payer: Self-pay | Admitting: Vascular Surgery

## 2016-12-23 VITALS — BP 189/133 | HR 73 | Temp 97.0°F | Resp 18 | Ht 68.0 in | Wt 142.0 lb

## 2016-12-23 DIAGNOSIS — I721 Aneurysm of artery of upper extremity: Secondary | ICD-10-CM

## 2016-12-23 NOTE — Progress Notes (Signed)
    Postoperative Visit   History of Present Illness  Marvin Roberson is a 46 y.o. year old male who presents for postoperative follow-up for:   On 12/11/16: 1. Right forearm arm exploration 2. Resection of brachial artery pseudoaneurysm 3. Repair of brachial artery with interposition graft (6 mm Artegraft) 4. Open cannulation of axillobrachial graft 5. Left arm angiogram 6. Left subclavian artery angioplasty (6 mm x 40 mm) 7. Thrombectomy of axillobrachial graft, left brachial artery and ulnar artery 8. Exploration of left radial artery  [name] wounds still intact with staples in place with some skin bleeding.  Pt has some swelling in the area of his PSA repair.  He feels this has gone down however.  He reported went to the ED for bleeding from his skin edge.   For VQI Use Only  PRE-ADM LIVING: Home  AMB STATUS: Ambulatory  Physical Examination  Vitals:   12/23/16 0830 12/23/16 0833  BP: (!) 191/135 (!) 189/133  Pulse: 73   Resp: 18   Temp: 97 F (36.1 C)    LUE: Incisions are c/d/i, some skin bleeder in staple line, ballotable fluid at antecubital exposure, hand is room temp with 3/5 hand grip, neuro deficit unchanged from preop per pt  Medical Decision Making  Marvin Roberson is a 46 y.o. 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  Since this is improving, I am reluctant to re-explore the antecubitum.  If it doesn't appear improved next week, I will likely recommend: re-exploration given his history of PTFE L SCA to brachial artery bypass.  He continues to get abx during HD, so I don't think PO abx are needed.  This patient's brachial artery PSA was chronic in nature and I suspect present at the prior procedures at [facility] given the presence of pre-existing prolene sutures in the brachial artery upon exploration.  I  have discussed previously that his pain probably will require chronic Pain Clinic mgmt given this was pre-existing since his 2016 procedures.  I also made it clear to the patient that he needs to transfer his care back to a tertiary facility as he is likely to require a redo of his left SCA to brachial bypass with deep vein.  Deep vein harvest is has not been done at [facility] over the last 10 years.  The patient will return for re-evaluation in 1 week.  Thank you for allowing [place] to participate in this patient's care.  Adele Barthel, MD, FACS [facility] [place] Office: [phone] Pager: [phone]

## 2016-12-24 DIAGNOSIS — N2581 Secondary hyperparathyroidism of renal origin: Secondary | ICD-10-CM | POA: Diagnosis not present

## 2016-12-24 DIAGNOSIS — N186 End stage renal disease: Secondary | ICD-10-CM | POA: Diagnosis not present

## 2016-12-24 DIAGNOSIS — D631 Anemia in chronic kidney disease: Secondary | ICD-10-CM | POA: Diagnosis not present

## 2016-12-24 DIAGNOSIS — D509 Iron deficiency anemia, unspecified: Secondary | ICD-10-CM | POA: Diagnosis not present

## 2016-12-24 DIAGNOSIS — R7881 Bacteremia: Secondary | ICD-10-CM | POA: Diagnosis not present

## 2016-12-26 DIAGNOSIS — D509 Iron deficiency anemia, unspecified: Secondary | ICD-10-CM | POA: Diagnosis not present

## 2016-12-26 DIAGNOSIS — R7881 Bacteremia: Secondary | ICD-10-CM | POA: Diagnosis not present

## 2016-12-26 DIAGNOSIS — N2581 Secondary hyperparathyroidism of renal origin: Secondary | ICD-10-CM | POA: Diagnosis not present

## 2016-12-26 DIAGNOSIS — D631 Anemia in chronic kidney disease: Secondary | ICD-10-CM | POA: Diagnosis not present

## 2016-12-26 DIAGNOSIS — N186 End stage renal disease: Secondary | ICD-10-CM | POA: Diagnosis not present

## 2016-12-27 ENCOUNTER — Encounter: Payer: Self-pay | Admitting: Vascular Surgery

## 2016-12-28 DIAGNOSIS — D509 Iron deficiency anemia, unspecified: Secondary | ICD-10-CM | POA: Diagnosis not present

## 2016-12-28 DIAGNOSIS — R7881 Bacteremia: Secondary | ICD-10-CM | POA: Diagnosis not present

## 2016-12-28 DIAGNOSIS — N186 End stage renal disease: Secondary | ICD-10-CM | POA: Diagnosis not present

## 2016-12-28 DIAGNOSIS — N2581 Secondary hyperparathyroidism of renal origin: Secondary | ICD-10-CM | POA: Diagnosis not present

## 2016-12-28 DIAGNOSIS — D631 Anemia in chronic kidney disease: Secondary | ICD-10-CM | POA: Diagnosis not present

## 2016-12-30 DIAGNOSIS — D631 Anemia in chronic kidney disease: Secondary | ICD-10-CM | POA: Diagnosis not present

## 2016-12-30 DIAGNOSIS — N2581 Secondary hyperparathyroidism of renal origin: Secondary | ICD-10-CM | POA: Diagnosis not present

## 2016-12-30 DIAGNOSIS — N186 End stage renal disease: Secondary | ICD-10-CM | POA: Diagnosis not present

## 2016-12-30 DIAGNOSIS — D509 Iron deficiency anemia, unspecified: Secondary | ICD-10-CM | POA: Diagnosis not present

## 2016-12-30 DIAGNOSIS — R7881 Bacteremia: Secondary | ICD-10-CM | POA: Diagnosis not present

## 2017-01-01 ENCOUNTER — Other Ambulatory Visit: Payer: Self-pay | Admitting: Internal Medicine

## 2017-01-01 DIAGNOSIS — N186 End stage renal disease: Secondary | ICD-10-CM | POA: Diagnosis not present

## 2017-01-01 DIAGNOSIS — D509 Iron deficiency anemia, unspecified: Secondary | ICD-10-CM | POA: Diagnosis not present

## 2017-01-01 DIAGNOSIS — R7881 Bacteremia: Secondary | ICD-10-CM | POA: Diagnosis not present

## 2017-01-01 DIAGNOSIS — I129 Hypertensive chronic kidney disease with stage 1 through stage 4 chronic kidney disease, or unspecified chronic kidney disease: Secondary | ICD-10-CM | POA: Diagnosis not present

## 2017-01-01 DIAGNOSIS — Z992 Dependence on renal dialysis: Secondary | ICD-10-CM | POA: Diagnosis not present

## 2017-01-01 DIAGNOSIS — D631 Anemia in chronic kidney disease: Secondary | ICD-10-CM | POA: Diagnosis not present

## 2017-01-01 DIAGNOSIS — N2581 Secondary hyperparathyroidism of renal origin: Secondary | ICD-10-CM | POA: Diagnosis not present

## 2017-01-02 NOTE — Progress Notes (Signed)
    Postoperative Visit   History of Present Illness  Marvin Roberson is a 46 y.o. ([birth year]) male who presents for postoperative follow-up for:   On 12/11/16:  1. Right forearm arm exploration 2. Resection of brachial artery pseudoaneurysm 3. Repair of brachial artery with interposition graft (6 mm Artegraft) 4. Open cannulation of axillobrachial graft 5. Left arm angiogram 6. Left subclavian artery angioplasty (6 mm x 40 mm) 7. Thrombectomy of axillobrachial graft,left brachial artery and ulnar artery 8. Exploration of left radial artery  Pt notes bleeding has stopped but swelling in L antecubitum somewhat bigger  For VQI Use Only  PRE-ADM LIVING: Home  AMB STATUS: Ambulatory   Physical Examination  Vitals:   01/03/17 1458 01/03/17 1501  BP: (!) 195/135 (!) 169/98  Pulse: 73   Resp: 16   Temp: 97.5 F (36.4 C)   TempSrc: Oral   SpO2: 98%   Weight: 144 lb (65.3 kg)   Height: 5\' 8"  (1.727 m)     LUE: staples intact, incisions are healing, palpable mass in L antecubitum, echymosis over brachial artery exposure, on Sonosite: graft visualized with what appears to be hematoma surrounding, no signs of cellulitis, faintly palpable radial pulse, motor exam unchanged (hand grip 3-4/5 with incomplete hand grip)  Medical Decision Making  Marvin Roberson is a 46 y.o. 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  Conservative measures for now including warm compress to L antecubitum  Follow up for wound check in 4 weeks.  Adele Barthel, MD, FACS [facility] [place] Office: [phone] Pager: [phone]

## 2017-01-03 ENCOUNTER — Encounter: Payer: Self-pay | Admitting: Vascular Surgery

## 2017-01-03 ENCOUNTER — Ambulatory Visit (INDEPENDENT_AMBULATORY_CARE_PROVIDER_SITE_OTHER): Payer: Self-pay | Admitting: Vascular Surgery

## 2017-01-03 VITALS — BP 169/98 | HR 73 | Temp 97.5°F | Resp 16 | Ht 68.0 in | Wt 144.0 lb

## 2017-01-03 DIAGNOSIS — I721 Aneurysm of artery of upper extremity: Secondary | ICD-10-CM

## 2017-01-04 DIAGNOSIS — R7881 Bacteremia: Secondary | ICD-10-CM | POA: Diagnosis not present

## 2017-01-04 DIAGNOSIS — N186 End stage renal disease: Secondary | ICD-10-CM | POA: Diagnosis not present

## 2017-01-04 DIAGNOSIS — N2581 Secondary hyperparathyroidism of renal origin: Secondary | ICD-10-CM | POA: Diagnosis not present

## 2017-01-04 DIAGNOSIS — D631 Anemia in chronic kidney disease: Secondary | ICD-10-CM | POA: Diagnosis not present

## 2017-01-04 DIAGNOSIS — D509 Iron deficiency anemia, unspecified: Secondary | ICD-10-CM | POA: Diagnosis not present

## 2017-01-06 DIAGNOSIS — D631 Anemia in chronic kidney disease: Secondary | ICD-10-CM | POA: Diagnosis not present

## 2017-01-06 DIAGNOSIS — N186 End stage renal disease: Secondary | ICD-10-CM | POA: Diagnosis not present

## 2017-01-06 DIAGNOSIS — D509 Iron deficiency anemia, unspecified: Secondary | ICD-10-CM | POA: Diagnosis not present

## 2017-01-06 DIAGNOSIS — R7881 Bacteremia: Secondary | ICD-10-CM | POA: Diagnosis not present

## 2017-01-06 DIAGNOSIS — N2581 Secondary hyperparathyroidism of renal origin: Secondary | ICD-10-CM | POA: Diagnosis not present

## 2017-01-08 DIAGNOSIS — N2581 Secondary hyperparathyroidism of renal origin: Secondary | ICD-10-CM | POA: Diagnosis not present

## 2017-01-08 DIAGNOSIS — D509 Iron deficiency anemia, unspecified: Secondary | ICD-10-CM | POA: Diagnosis not present

## 2017-01-08 DIAGNOSIS — R7881 Bacteremia: Secondary | ICD-10-CM | POA: Diagnosis not present

## 2017-01-08 DIAGNOSIS — D631 Anemia in chronic kidney disease: Secondary | ICD-10-CM | POA: Diagnosis not present

## 2017-01-08 DIAGNOSIS — N186 End stage renal disease: Secondary | ICD-10-CM | POA: Diagnosis not present

## 2017-01-10 DIAGNOSIS — N186 End stage renal disease: Secondary | ICD-10-CM | POA: Diagnosis not present

## 2017-01-10 DIAGNOSIS — R7881 Bacteremia: Secondary | ICD-10-CM | POA: Diagnosis not present

## 2017-01-10 DIAGNOSIS — D631 Anemia in chronic kidney disease: Secondary | ICD-10-CM | POA: Diagnosis not present

## 2017-01-10 DIAGNOSIS — D509 Iron deficiency anemia, unspecified: Secondary | ICD-10-CM | POA: Diagnosis not present

## 2017-01-10 DIAGNOSIS — N2581 Secondary hyperparathyroidism of renal origin: Secondary | ICD-10-CM | POA: Diagnosis not present

## 2017-01-13 ENCOUNTER — Encounter: Payer: Self-pay | Admitting: Vascular Surgery

## 2017-01-13 DIAGNOSIS — N2581 Secondary hyperparathyroidism of renal origin: Secondary | ICD-10-CM | POA: Diagnosis not present

## 2017-01-13 DIAGNOSIS — R7881 Bacteremia: Secondary | ICD-10-CM | POA: Diagnosis not present

## 2017-01-13 DIAGNOSIS — D509 Iron deficiency anemia, unspecified: Secondary | ICD-10-CM | POA: Diagnosis not present

## 2017-01-13 DIAGNOSIS — N186 End stage renal disease: Secondary | ICD-10-CM | POA: Diagnosis not present

## 2017-01-13 DIAGNOSIS — D631 Anemia in chronic kidney disease: Secondary | ICD-10-CM | POA: Diagnosis not present

## 2017-01-15 DIAGNOSIS — R7881 Bacteremia: Secondary | ICD-10-CM | POA: Diagnosis not present

## 2017-01-15 DIAGNOSIS — D631 Anemia in chronic kidney disease: Secondary | ICD-10-CM | POA: Diagnosis not present

## 2017-01-15 DIAGNOSIS — N186 End stage renal disease: Secondary | ICD-10-CM | POA: Diagnosis not present

## 2017-01-15 DIAGNOSIS — N2581 Secondary hyperparathyroidism of renal origin: Secondary | ICD-10-CM | POA: Diagnosis not present

## 2017-01-15 DIAGNOSIS — D509 Iron deficiency anemia, unspecified: Secondary | ICD-10-CM | POA: Diagnosis not present

## 2017-01-16 ENCOUNTER — Encounter: Payer: Self-pay | Admitting: Internal Medicine

## 2017-01-16 ENCOUNTER — Ambulatory Visit (INDEPENDENT_AMBULATORY_CARE_PROVIDER_SITE_OTHER): Payer: Medicare Other | Admitting: Internal Medicine

## 2017-01-16 VITALS — BP 180/116 | HR 72 | Temp 97.9°F | Ht 68.0 in | Wt 139.0 lb

## 2017-01-16 DIAGNOSIS — I729 Aneurysm of unspecified site: Secondary | ICD-10-CM

## 2017-01-16 DIAGNOSIS — I10 Essential (primary) hypertension: Secondary | ICD-10-CM

## 2017-01-16 DIAGNOSIS — N186 End stage renal disease: Secondary | ICD-10-CM

## 2017-01-16 DIAGNOSIS — T148XXA Other injury of unspecified body region, initial encounter: Secondary | ICD-10-CM | POA: Diagnosis not present

## 2017-01-16 NOTE — Progress Notes (Signed)
Patient ID: Marvin Roberson, male   DOB: 1971/09/27, 46 y.o.   MRN: [id number]  HPI  Hospital follow up for possibly mycotic aneurysm  Marvin Roberson is a 46 y.o. male with End-stage renal disease on hemodialysis who underwent vascular surgery occluding resection of a pseudoaneurysm that is now been found to have organisms growing on culture raising concern for a mycotic aneurysm on 2/7 where he had:  1. Right forearm arm exploration 2. Resection of brachial artery pseudoaneurysm 3. Repair of brachial artery with interposition graft (6 mm Artegraft) 4. Open cannulation of axillobrachial graft 5. Left arm angiogram 6. Left subclavian artery angioplasty (6 mm x 40 mm) 7. Thrombectomy of axillobrachial graft,left brachial artery and ulnar artery 8. Exploration of left radial artery    Gram stain had few GNR and few GPCs but unabel to grow on culture. 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 He saw dr Bridgett Larsson on 3/2 hwere he though that the patient Marvin Roberson has a hematoma overlying the interposition graft. He recommended warm compresses to L antecubitum to do conservative measure to decrease hematoma. He felt decompression through aspriation would placed the graft in line at risk for contamination. Patient states that the swelling is going down but still present   I have reviewed his medical records in [place] link Outpatient Encounter Prescriptions as of 01/16/2017  Medication Sig  . aspirin EC 81 MG tablet Take 81 mg by mouth daily.  Marland Kitchen atorvastatin (LIPITOR) 40 MG tablet Take 40 mg by mouth daily.  . carvedilol (COREG) 25 MG tablet Take 25 mg by mouth 2 (two) times daily with a meal.  . cefTAZidime 2 g in dextrose 5 % 50 mL Inject 2 g into the vein every Tuesday, Thursday, and Saturday at 6 PM.  . hydrALAZINE (APRESOLINE)  100 MG tablet Take 100 mg by mouth 3 (three) times daily.  . hydrocortisone (CORTEF) 5 MG tablet Take 3 tablets (15 mg) every morning  . hydrocortisone (CORTEF) 5 MG tablet Take 1 tablet (5 mg total) by mouth every evening.  Marland Kitchen losartan (COZAAR) 100 MG tablet Take 100 mg by mouth daily.  Marland Kitchen omeprazole (PRILOSEC) 40 MG capsule Take 40 mg by mouth daily.  . Vancomycin (VANCOCIN) 750-5 MG/150ML-% SOLN Inject 150 mLs (750 mg total) into the vein Every Tuesday,Thursday,and Saturday with dialysis.  Marland Kitchen oxyCODONE-acetaminophen (PERCOCET/ROXICET) 5-325 MG tablet Take 1-2 tablets by mouth every 6 (six) hours as needed for moderate pain. (Patient not taking: Reported on 01/16/2017)   No facility-administered encounter medications on file as of 01/16/2017.      Patient Active Problem List   Diagnosis Date Noted  . Post-operative complication 51/88/4166  . 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[place])   . ESRD on hemodialysis ([place])   . 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[place]) 12/11/2016  . Adrenal crisis ([place])   . Hypoglycemia 11/27/2016  . ESRD (end stage renal disease) ([place]) 11/27/2016  . Flu-like symptoms 11/27/2016  . Nausea & vomiting 11/27/2016  . Diarrhea 11/27/2016  . Hypertension 11/27/2016  . History of CVA (cerebrovascular accident) 11/27/2016  . Monoclonal gammopathy of unknown significance ([facility]) 11/27/2016     Health Maintenance Due  Topic Date Due  . TETANUS/TDAP  07/14/1991  . INFLUENZA VACCINE  06/04/2016    Social History  Substance Use Topics  . Smoking status: Current Every Day Smoker  Packs/day: 0.12    Years: 20.00    Types: Cigarettes  . Smokeless tobacco: Never Used     Comment: Less than 1/2 pk per day  . Alcohol use No    Review of Systems Review of Systems  Constitutional: Negative for fever, chills, diaphoresis, activity change, appetite change, fatigue and unexpected weight change.  HENT: Negative for congestion, sore throat, rhinorrhea, sneezing, trouble swallowing and sinus  pressure.  Eyes: Negative for photophobia and visual disturbance.  Respiratory: Negative for cough, chest tightness, shortness of breath, wheezing and stridor.  Cardiovascular: Negative for chest pain, palpitations and leg swelling.  Gastrointestinal: Negative for nausea, vomiting, abdominal pain, diarrhea, constipation, blood in stool, abdominal distention and anal bleeding.  Genitourinary: Negative for dysuria, hematuria, flank pain and difficulty urinating.  Musculoskeletal: Negative for myalgias, back pain, joint swelling, arthralgias and gait problem.  Skin: L graft swelling with bruising Neurological: Negative for dizziness, tremors, weakness and light-headedness.  Hematological: Negative for adenopathy. Does not bruise/bleed easily.  Psychiatric/Behavioral: Negative for behavioral problems, confusion, sleep disturbance, dysphoric mood, decreased concentration and agitation.    Physical Exam   BP (!) 180/116   Pulse 72   Temp 97.9 F (36.6 C) (Oral)   Ht 5\' 8"  (1.727 m)   Wt 139 lb (63 kg)   BMI 21.13 kg/m    Physical Exam  Constitutional: He is oriented to person, place, and time. He appears well-developed and well-nourished. No distress.  HENT:  Mouth/Throat: Oropharynx is clear and moist. No oropharyngeal exudate.  Cardiovascular: Normal rate, regular rhythm and normal heart sounds. Exam reveals no gallop and no friction rub.  No murmur heard.  Pulmonary/Chest: Effort normal and breath sounds normal. No respiratory distress. He has no wheezes.   Skin: Skin is warm and dry. L arm has large antecubital hematoma/ erythema. Not warm + pulses Psychiatric: He has a normal mood and affect. His behavior is normal.    Lab Results  Component Value Date   HEPBSAB Reactive 11/28/2016   No results found for: RPR, LABRPR  CBC Lab Results  Component Value Date   WBC 5.2 12/17/2016   RBC 2.41 (L) 12/17/2016   HGB 8.2 (L) 12/17/2016   HCT 24.0 (L) 12/17/2016   PLT 131 (L)  12/17/2016   MCV 92.9 12/17/2016   MCH 30.3 12/17/2016   MCHC 32.6 12/17/2016   RDW 18.1 (H) 12/17/2016   LYMPHSABS 1.0 12/17/2016   MONOABS 0.4 12/17/2016   EOSABS 0.1 12/17/2016    BMET Lab Results  Component Value Date   NA 139 12/17/2016   K 3.6 12/17/2016   CL 96 (L) 12/17/2016   CO2 21 (L) 12/12/2016   GLUCOSE 92 12/17/2016   BUN 15 12/17/2016   CREATININE 5.90 (H) 12/17/2016   CALCIUM 6.3 (LL) 12/12/2016   GFRNONAA 6 (L) 12/12/2016   GFRAA 7 (L) 12/12/2016     Assessment and Plan  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 We will have him repeat blood cx the following week, likely last week of March to ensure no signs of further infection  Hematoma at left graft = agree for now to continue to use hot compresses to help with absorption of hematoma. He is currently on abtx so in theory it should not be infected.  Hypertension = will be receiving HD today

## 2017-01-17 DIAGNOSIS — R7881 Bacteremia: Secondary | ICD-10-CM | POA: Diagnosis not present

## 2017-01-17 DIAGNOSIS — D509 Iron deficiency anemia, unspecified: Secondary | ICD-10-CM | POA: Diagnosis not present

## 2017-01-17 DIAGNOSIS — D631 Anemia in chronic kidney disease: Secondary | ICD-10-CM | POA: Diagnosis not present

## 2017-01-17 DIAGNOSIS — N186 End stage renal disease: Secondary | ICD-10-CM | POA: Diagnosis not present

## 2017-01-17 DIAGNOSIS — N2581 Secondary hyperparathyroidism of renal origin: Secondary | ICD-10-CM | POA: Diagnosis not present

## 2017-01-20 DIAGNOSIS — R7881 Bacteremia: Secondary | ICD-10-CM | POA: Diagnosis not present

## 2017-01-20 DIAGNOSIS — D509 Iron deficiency anemia, unspecified: Secondary | ICD-10-CM | POA: Diagnosis not present

## 2017-01-20 DIAGNOSIS — N186 End stage renal disease: Secondary | ICD-10-CM | POA: Diagnosis not present

## 2017-01-20 DIAGNOSIS — D631 Anemia in chronic kidney disease: Secondary | ICD-10-CM | POA: Diagnosis not present

## 2017-01-20 DIAGNOSIS — N2581 Secondary hyperparathyroidism of renal origin: Secondary | ICD-10-CM | POA: Diagnosis not present

## 2017-01-20 NOTE — Progress Notes (Deleted)
    Postoperative Visit   History of Present Illness  Lekendrick Alpern is a 46 y.o. ([birth year]) male who presents for postoperative follow-up for:   On 12/11/16: Right forearm arm exploration 1. Resection of brachial artery pseudoaneurysm 2. Repair of brachial artery with interposition graft (6 mm Artegraft) 3. Open cannulation of axillobrachial graft 4. Left arm angiogram 5. Left subclavian artery angioplasty (6 mm x 40 mm) 6. Thrombectomy of axillobrachial graft,left brachial artery and ulnar artery 7. Exploration of left radial artery  ***   Physical Examination  There were no vitals filed for this visit.  LUE: ***   Medical Decision Making  Nihar Klus is a 46 y.o. ([birth year]) male who presents s/p resection of likely chronic brachial artery mycotic PSA, L SCA PTA, Repair of brachial artery with interposition graft   Likely hematoma overlying the interposition graft  I don't recommend decompression due multiple segments of graft in-line and risk of contamination.  Conservative measures for now including warm compress to L antecubitum  Follow up for wound check in 4 weeks.   Adele Barthel, MD, FACS [facility] Office: [phone] Pager: [phone]

## 2017-01-21 ENCOUNTER — Telehealth: Payer: Self-pay | Admitting: *Deleted

## 2017-01-21 NOTE — Telephone Encounter (Signed)
Patient left message stating that this dialysis center could not take the orders from Dr Baxter Flattery, needed [place] to speak with them. RN called [facility], left message for charge nurse asking her to call back.  Dr Baxter Flattery would like repeat blood cultures x 2 drawn from peripheral sites, results faxed back to [phone]. Landis Gandy, RN

## 2017-01-22 DIAGNOSIS — R7881 Bacteremia: Secondary | ICD-10-CM | POA: Diagnosis not present

## 2017-01-22 DIAGNOSIS — N186 End stage renal disease: Secondary | ICD-10-CM | POA: Diagnosis not present

## 2017-01-22 DIAGNOSIS — N2581 Secondary hyperparathyroidism of renal origin: Secondary | ICD-10-CM | POA: Diagnosis not present

## 2017-01-22 DIAGNOSIS — D509 Iron deficiency anemia, unspecified: Secondary | ICD-10-CM | POA: Diagnosis not present

## 2017-01-22 DIAGNOSIS — D631 Anemia in chronic kidney disease: Secondary | ICD-10-CM | POA: Diagnosis not present

## 2017-01-24 ENCOUNTER — Ambulatory Visit: Payer: Medicare Other | Admitting: Vascular Surgery

## 2017-01-24 DIAGNOSIS — N186 End stage renal disease: Secondary | ICD-10-CM | POA: Diagnosis not present

## 2017-01-24 DIAGNOSIS — R7881 Bacteremia: Secondary | ICD-10-CM | POA: Diagnosis not present

## 2017-01-24 DIAGNOSIS — D509 Iron deficiency anemia, unspecified: Secondary | ICD-10-CM | POA: Diagnosis not present

## 2017-01-24 DIAGNOSIS — N2581 Secondary hyperparathyroidism of renal origin: Secondary | ICD-10-CM | POA: Diagnosis not present

## 2017-01-24 DIAGNOSIS — D631 Anemia in chronic kidney disease: Secondary | ICD-10-CM | POA: Diagnosis not present

## 2017-01-27 ENCOUNTER — Telehealth: Payer: Self-pay | Admitting: Vascular Surgery

## 2017-01-27 DIAGNOSIS — D509 Iron deficiency anemia, unspecified: Secondary | ICD-10-CM | POA: Diagnosis not present

## 2017-01-27 DIAGNOSIS — N186 End stage renal disease: Secondary | ICD-10-CM | POA: Diagnosis not present

## 2017-01-27 DIAGNOSIS — N2581 Secondary hyperparathyroidism of renal origin: Secondary | ICD-10-CM | POA: Diagnosis not present

## 2017-01-27 DIAGNOSIS — D631 Anemia in chronic kidney disease: Secondary | ICD-10-CM | POA: Diagnosis not present

## 2017-01-27 DIAGNOSIS — R7881 Bacteremia: Secondary | ICD-10-CM | POA: Diagnosis not present

## 2017-01-27 NOTE — Telephone Encounter (Signed)
Patient called and left voice msg wanting to know why antibiotics were ordered for additional weeks.  I called patient to ask for more information and there was no answer.  Left voice msg on answering machine.  Ashleigh E., LPN.

## 2017-01-29 DIAGNOSIS — R7881 Bacteremia: Secondary | ICD-10-CM | POA: Diagnosis not present

## 2017-01-29 DIAGNOSIS — N2581 Secondary hyperparathyroidism of renal origin: Secondary | ICD-10-CM | POA: Diagnosis not present

## 2017-01-29 DIAGNOSIS — D509 Iron deficiency anemia, unspecified: Secondary | ICD-10-CM | POA: Diagnosis not present

## 2017-01-29 DIAGNOSIS — D631 Anemia in chronic kidney disease: Secondary | ICD-10-CM | POA: Diagnosis not present

## 2017-01-29 DIAGNOSIS — N186 End stage renal disease: Secondary | ICD-10-CM | POA: Diagnosis not present

## 2017-01-29 NOTE — Telephone Encounter (Signed)
Clarified orders with Janace Hoard, Camera operator at [facility]. They have orders for blood cultures to be drawn 2 weeks after IV antibiotics complete.  They will be drawn 4/6, results will be faxed attn Cassie at [phone].

## 2017-01-31 DIAGNOSIS — N186 End stage renal disease: Secondary | ICD-10-CM | POA: Diagnosis not present

## 2017-01-31 DIAGNOSIS — D509 Iron deficiency anemia, unspecified: Secondary | ICD-10-CM | POA: Diagnosis not present

## 2017-01-31 DIAGNOSIS — N2581 Secondary hyperparathyroidism of renal origin: Secondary | ICD-10-CM | POA: Diagnosis not present

## 2017-01-31 DIAGNOSIS — D631 Anemia in chronic kidney disease: Secondary | ICD-10-CM | POA: Diagnosis not present

## 2017-01-31 DIAGNOSIS — R7881 Bacteremia: Secondary | ICD-10-CM | POA: Diagnosis not present

## 2017-02-01 DIAGNOSIS — N186 End stage renal disease: Secondary | ICD-10-CM | POA: Diagnosis not present

## 2017-02-01 DIAGNOSIS — Z992 Dependence on renal dialysis: Secondary | ICD-10-CM | POA: Diagnosis not present

## 2017-02-01 DIAGNOSIS — I129 Hypertensive chronic kidney disease with stage 1 through stage 4 chronic kidney disease, or unspecified chronic kidney disease: Secondary | ICD-10-CM | POA: Diagnosis not present

## 2017-02-03 DIAGNOSIS — D631 Anemia in chronic kidney disease: Secondary | ICD-10-CM | POA: Diagnosis not present

## 2017-02-03 DIAGNOSIS — D509 Iron deficiency anemia, unspecified: Secondary | ICD-10-CM | POA: Diagnosis not present

## 2017-02-03 DIAGNOSIS — N186 End stage renal disease: Secondary | ICD-10-CM | POA: Diagnosis not present

## 2017-02-03 DIAGNOSIS — R7881 Bacteremia: Secondary | ICD-10-CM | POA: Diagnosis not present

## 2017-02-03 DIAGNOSIS — N2581 Secondary hyperparathyroidism of renal origin: Secondary | ICD-10-CM | POA: Diagnosis not present

## 2017-02-05 DIAGNOSIS — N186 End stage renal disease: Secondary | ICD-10-CM | POA: Diagnosis not present

## 2017-02-05 DIAGNOSIS — D631 Anemia in chronic kidney disease: Secondary | ICD-10-CM | POA: Diagnosis not present

## 2017-02-05 DIAGNOSIS — N2581 Secondary hyperparathyroidism of renal origin: Secondary | ICD-10-CM | POA: Diagnosis not present

## 2017-02-05 DIAGNOSIS — R7881 Bacteremia: Secondary | ICD-10-CM | POA: Diagnosis not present

## 2017-02-05 DIAGNOSIS — D509 Iron deficiency anemia, unspecified: Secondary | ICD-10-CM | POA: Diagnosis not present

## 2017-02-07 DIAGNOSIS — N2581 Secondary hyperparathyroidism of renal origin: Secondary | ICD-10-CM | POA: Diagnosis not present

## 2017-02-07 DIAGNOSIS — D509 Iron deficiency anemia, unspecified: Secondary | ICD-10-CM | POA: Diagnosis not present

## 2017-02-07 DIAGNOSIS — N186 End stage renal disease: Secondary | ICD-10-CM | POA: Diagnosis not present

## 2017-02-07 DIAGNOSIS — D631 Anemia in chronic kidney disease: Secondary | ICD-10-CM | POA: Diagnosis not present

## 2017-02-07 DIAGNOSIS — R7881 Bacteremia: Secondary | ICD-10-CM | POA: Diagnosis not present

## 2017-02-10 ENCOUNTER — Encounter: Payer: Self-pay | Admitting: Vascular Surgery

## 2017-02-10 DIAGNOSIS — N186 End stage renal disease: Secondary | ICD-10-CM | POA: Diagnosis not present

## 2017-02-10 DIAGNOSIS — N2581 Secondary hyperparathyroidism of renal origin: Secondary | ICD-10-CM | POA: Diagnosis not present

## 2017-02-10 DIAGNOSIS — R7881 Bacteremia: Secondary | ICD-10-CM | POA: Diagnosis not present

## 2017-02-10 DIAGNOSIS — D509 Iron deficiency anemia, unspecified: Secondary | ICD-10-CM | POA: Diagnosis not present

## 2017-02-10 DIAGNOSIS — D631 Anemia in chronic kidney disease: Secondary | ICD-10-CM | POA: Diagnosis not present

## 2017-02-12 DIAGNOSIS — D509 Iron deficiency anemia, unspecified: Secondary | ICD-10-CM | POA: Diagnosis not present

## 2017-02-12 DIAGNOSIS — R7881 Bacteremia: Secondary | ICD-10-CM | POA: Diagnosis not present

## 2017-02-12 DIAGNOSIS — D631 Anemia in chronic kidney disease: Secondary | ICD-10-CM | POA: Diagnosis not present

## 2017-02-12 DIAGNOSIS — N2581 Secondary hyperparathyroidism of renal origin: Secondary | ICD-10-CM | POA: Diagnosis not present

## 2017-02-12 DIAGNOSIS — N186 End stage renal disease: Secondary | ICD-10-CM | POA: Diagnosis not present

## 2017-02-13 ENCOUNTER — Ambulatory Visit: Payer: Medicare Other | Admitting: Internal Medicine

## 2017-02-14 DIAGNOSIS — D631 Anemia in chronic kidney disease: Secondary | ICD-10-CM | POA: Diagnosis not present

## 2017-02-14 DIAGNOSIS — R7881 Bacteremia: Secondary | ICD-10-CM | POA: Diagnosis not present

## 2017-02-14 DIAGNOSIS — D509 Iron deficiency anemia, unspecified: Secondary | ICD-10-CM | POA: Diagnosis not present

## 2017-02-14 DIAGNOSIS — N2581 Secondary hyperparathyroidism of renal origin: Secondary | ICD-10-CM | POA: Diagnosis not present

## 2017-02-14 DIAGNOSIS — N186 End stage renal disease: Secondary | ICD-10-CM | POA: Diagnosis not present

## 2017-02-17 DIAGNOSIS — N2581 Secondary hyperparathyroidism of renal origin: Secondary | ICD-10-CM | POA: Diagnosis not present

## 2017-02-17 DIAGNOSIS — D509 Iron deficiency anemia, unspecified: Secondary | ICD-10-CM | POA: Diagnosis not present

## 2017-02-17 DIAGNOSIS — D631 Anemia in chronic kidney disease: Secondary | ICD-10-CM | POA: Diagnosis not present

## 2017-02-17 DIAGNOSIS — R7881 Bacteremia: Secondary | ICD-10-CM | POA: Diagnosis not present

## 2017-02-17 DIAGNOSIS — N186 End stage renal disease: Secondary | ICD-10-CM | POA: Diagnosis not present

## 2017-02-17 NOTE — Progress Notes (Deleted)
    Postoperative Visit   History of Present Illness  Ustin Cruickshank is a 46 y.o. (02-Apr-1971) male who presents for postoperative follow-up for:   On 12/11/16:  1. Right forearm arm exploration 2. Resection of brachial artery pseudoaneurysm 3. Repair of brachial artery with interposition graft (6 mm Artegraft) 4. Open cannulation of axillobrachial graft 5. Left arm angiogram 6. Left subclavian artery angioplasty (6 mm x 40 mm) 7. Thrombectomy of axillobrachial graft,left brachial artery and ulnar artery 8. Exploration of left radial artery  Pt returns for re-evaluation of left arm swelling, felt to be swelling due to hematoma surrounding the repaired left brachial artery PSA.  ***   Physical Examination  There were no vitals filed for this visit.  LUE: ***   Medical Decision Making  Norris Brumbach is a 46 y.o. 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

## 2017-02-19 DIAGNOSIS — D509 Iron deficiency anemia, unspecified: Secondary | ICD-10-CM | POA: Diagnosis not present

## 2017-02-19 DIAGNOSIS — N186 End stage renal disease: Secondary | ICD-10-CM | POA: Diagnosis not present

## 2017-02-19 DIAGNOSIS — R7881 Bacteremia: Secondary | ICD-10-CM | POA: Diagnosis not present

## 2017-02-19 DIAGNOSIS — N2581 Secondary hyperparathyroidism of renal origin: Secondary | ICD-10-CM | POA: Diagnosis not present

## 2017-02-19 DIAGNOSIS — D631 Anemia in chronic kidney disease: Secondary | ICD-10-CM | POA: Diagnosis not present

## 2017-02-21 ENCOUNTER — Ambulatory Visit (INDEPENDENT_AMBULATORY_CARE_PROVIDER_SITE_OTHER): Payer: Medicare Other | Admitting: Vascular Surgery

## 2017-02-21 ENCOUNTER — Encounter: Payer: Self-pay | Admitting: Vascular Surgery

## 2017-02-21 ENCOUNTER — Ambulatory Visit: Payer: Self-pay | Admitting: Vascular Surgery

## 2017-02-21 VITALS — BP 181/115 | HR 77 | Temp 97.0°F | Resp 20 | Ht 68.0 in | Wt 136.5 lb

## 2017-02-21 DIAGNOSIS — D631 Anemia in chronic kidney disease: Secondary | ICD-10-CM | POA: Diagnosis not present

## 2017-02-21 DIAGNOSIS — N186 End stage renal disease: Secondary | ICD-10-CM | POA: Diagnosis not present

## 2017-02-21 DIAGNOSIS — D509 Iron deficiency anemia, unspecified: Secondary | ICD-10-CM | POA: Diagnosis not present

## 2017-02-21 DIAGNOSIS — N2581 Secondary hyperparathyroidism of renal origin: Secondary | ICD-10-CM | POA: Diagnosis not present

## 2017-02-21 DIAGNOSIS — R7881 Bacteremia: Secondary | ICD-10-CM | POA: Diagnosis not present

## 2017-02-21 DIAGNOSIS — I721 Aneurysm of artery of upper extremity: Secondary | ICD-10-CM

## 2017-02-21 NOTE — Progress Notes (Signed)
.     Postoperative Visit   History of Present Illness  Marvin Roberson is a 46 y.o. (05-22-1971) male  who presents for postoperative follow-up for:   On 12/11/16:  1. Right forearm arm exploration 2. Resection of brachial artery pseudoaneurysm 3. Repair of brachial artery with interposition graft (6 mm Artegraft) 4. Open cannulation of axillobrachial graft 5. Left arm angiogram 6. Left subclavian artery angioplasty (6 mm x 40 mm) 7. Thrombectomy of axillobrachial graft,left brachial artery and ulnar artery 8. Exploration of left radial artery  Swelling in L antecubital exposure resolved with some firmness now. L hand motor improved.  Sensation still compromised as preop.   Physical Examination  Vitals:   02/21/17 0908 02/21/17 0910  BP: (!) 193/125 (!) 181/115  Pulse: 77   Resp: 20   Temp: 97 F (36.1 C)   TempSrc: Oral   SpO2: 100%   Weight: 136 lb 8 oz (61.9 kg)   Height: 5\' 8"  (1.727 m)      LUE: all incisions healed, firm mass in antecubitum overlying PSA exposure, no further ballotable fluid, hand grip unchanged with unchanged sensaiton  All blood cultures NGTD   Medical Decision Making  Marvin Roberson is a 46 y.o. (June 08, 1971) male who presents s/p resection of likely chronic brachial artery mycotic PSA, L SCA PTA, Repair of brachial artery with interposition graft   We discussed his the future management of his L arm bypass.  The proximal anastomosis of the L ax-brachial PTFE BPG is already developing some likely intimal hyperplasia, so he will likely need a redo in the future.  I doubt he had a PTFE bypass if he had adequate GSV, which leaves only deep vein conduit in this patient.    I discussed with the patient that no one in the last decade has used deep vein conduit in the [facility] medical [facility], so refer back to a tertiary care facility would be to his advantage.  After some discussion, he has agreed to go back to [facility] which has previously  done his prior procedures.  We will refer him back to them to resume care of their patient.  Adele Barthel, MD, FACS [facility] [place] Office: [phone] Pager: [phone]

## 2017-02-24 DIAGNOSIS — D509 Iron deficiency anemia, unspecified: Secondary | ICD-10-CM | POA: Diagnosis not present

## 2017-02-24 DIAGNOSIS — R7881 Bacteremia: Secondary | ICD-10-CM | POA: Diagnosis not present

## 2017-02-24 DIAGNOSIS — N186 End stage renal disease: Secondary | ICD-10-CM | POA: Diagnosis not present

## 2017-02-24 DIAGNOSIS — D631 Anemia in chronic kidney disease: Secondary | ICD-10-CM | POA: Diagnosis not present

## 2017-02-24 DIAGNOSIS — N2581 Secondary hyperparathyroidism of renal origin: Secondary | ICD-10-CM | POA: Diagnosis not present

## 2017-02-26 DIAGNOSIS — D631 Anemia in chronic kidney disease: Secondary | ICD-10-CM | POA: Diagnosis not present

## 2017-02-26 DIAGNOSIS — R7881 Bacteremia: Secondary | ICD-10-CM | POA: Diagnosis not present

## 2017-02-26 DIAGNOSIS — D509 Iron deficiency anemia, unspecified: Secondary | ICD-10-CM | POA: Diagnosis not present

## 2017-02-26 DIAGNOSIS — N186 End stage renal disease: Secondary | ICD-10-CM | POA: Diagnosis not present

## 2017-02-26 DIAGNOSIS — N2581 Secondary hyperparathyroidism of renal origin: Secondary | ICD-10-CM | POA: Diagnosis not present

## 2017-03-01 DIAGNOSIS — D631 Anemia in chronic kidney disease: Secondary | ICD-10-CM | POA: Diagnosis not present

## 2017-03-01 DIAGNOSIS — D509 Iron deficiency anemia, unspecified: Secondary | ICD-10-CM | POA: Diagnosis not present

## 2017-03-01 DIAGNOSIS — N186 End stage renal disease: Secondary | ICD-10-CM | POA: Diagnosis not present

## 2017-03-01 DIAGNOSIS — R7881 Bacteremia: Secondary | ICD-10-CM | POA: Diagnosis not present

## 2017-03-01 DIAGNOSIS — N2581 Secondary hyperparathyroidism of renal origin: Secondary | ICD-10-CM | POA: Diagnosis not present

## 2017-03-03 DIAGNOSIS — N2581 Secondary hyperparathyroidism of renal origin: Secondary | ICD-10-CM | POA: Diagnosis not present

## 2017-03-03 DIAGNOSIS — D509 Iron deficiency anemia, unspecified: Secondary | ICD-10-CM | POA: Diagnosis not present

## 2017-03-03 DIAGNOSIS — N186 End stage renal disease: Secondary | ICD-10-CM | POA: Diagnosis not present

## 2017-03-03 DIAGNOSIS — I129 Hypertensive chronic kidney disease with stage 1 through stage 4 chronic kidney disease, or unspecified chronic kidney disease: Secondary | ICD-10-CM | POA: Diagnosis not present

## 2017-03-03 DIAGNOSIS — D631 Anemia in chronic kidney disease: Secondary | ICD-10-CM | POA: Diagnosis not present

## 2017-03-03 DIAGNOSIS — R7881 Bacteremia: Secondary | ICD-10-CM | POA: Diagnosis not present

## 2017-03-03 DIAGNOSIS — Z992 Dependence on renal dialysis: Secondary | ICD-10-CM | POA: Diagnosis not present

## 2017-03-05 DIAGNOSIS — N186 End stage renal disease: Secondary | ICD-10-CM | POA: Diagnosis not present

## 2017-03-05 DIAGNOSIS — D509 Iron deficiency anemia, unspecified: Secondary | ICD-10-CM | POA: Diagnosis not present

## 2017-03-05 DIAGNOSIS — N2581 Secondary hyperparathyroidism of renal origin: Secondary | ICD-10-CM | POA: Diagnosis not present

## 2017-03-05 DIAGNOSIS — D631 Anemia in chronic kidney disease: Secondary | ICD-10-CM | POA: Diagnosis not present

## 2017-03-07 DIAGNOSIS — N2581 Secondary hyperparathyroidism of renal origin: Secondary | ICD-10-CM | POA: Diagnosis not present

## 2017-03-07 DIAGNOSIS — D509 Iron deficiency anemia, unspecified: Secondary | ICD-10-CM | POA: Diagnosis not present

## 2017-03-07 DIAGNOSIS — D631 Anemia in chronic kidney disease: Secondary | ICD-10-CM | POA: Diagnosis not present

## 2017-03-07 DIAGNOSIS — N186 End stage renal disease: Secondary | ICD-10-CM | POA: Diagnosis not present

## 2017-03-09 ENCOUNTER — Emergency Department (HOSPITAL_COMMUNITY): Payer: Medicare Other

## 2017-03-09 ENCOUNTER — Emergency Department (HOSPITAL_COMMUNITY)
Admission: EM | Admit: 2017-03-09 | Discharge: 2017-03-09 | Disposition: A | Discharge status: Home / Self Care | Payer: Medicare Other | Attending: Emergency Medicine | Admitting: Emergency Medicine

## 2017-03-09 ENCOUNTER — Encounter (HOSPITAL_COMMUNITY): Payer: Self-pay | Admitting: Emergency Medicine

## 2017-03-09 DIAGNOSIS — Z7982 Long term (current) use of aspirin: Secondary | ICD-10-CM | POA: Diagnosis not present

## 2017-03-09 DIAGNOSIS — S63501A Unspecified sprain of right wrist, initial encounter: Secondary | ICD-10-CM

## 2017-03-09 DIAGNOSIS — Y9389 Activity, other specified: Secondary | ICD-10-CM | POA: Diagnosis not present

## 2017-03-09 DIAGNOSIS — Z992 Dependence on renal dialysis: Secondary | ICD-10-CM | POA: Diagnosis not present

## 2017-03-09 DIAGNOSIS — Y929 Unspecified place or not applicable: Secondary | ICD-10-CM | POA: Diagnosis not present

## 2017-03-09 DIAGNOSIS — W010XXA Fall on same level from slipping, tripping and stumbling without subsequent striking against object, initial encounter: Secondary | ICD-10-CM | POA: Insufficient documentation

## 2017-03-09 DIAGNOSIS — Z79899 Other long term (current) drug therapy: Secondary | ICD-10-CM | POA: Diagnosis not present

## 2017-03-09 DIAGNOSIS — Y999 Unspecified external cause status: Secondary | ICD-10-CM | POA: Diagnosis not present

## 2017-03-09 DIAGNOSIS — I120 Hypertensive chronic kidney disease with stage 5 chronic kidney disease or end stage renal disease: Secondary | ICD-10-CM | POA: Diagnosis not present

## 2017-03-09 DIAGNOSIS — Z8673 Personal history of transient ischemic attack (TIA), and cerebral infarction without residual deficits: Secondary | ICD-10-CM | POA: Insufficient documentation

## 2017-03-09 DIAGNOSIS — N186 End stage renal disease: Secondary | ICD-10-CM | POA: Diagnosis not present

## 2017-03-09 DIAGNOSIS — F17210 Nicotine dependence, cigarettes, uncomplicated: Secondary | ICD-10-CM | POA: Insufficient documentation

## 2017-03-09 DIAGNOSIS — S6991XA Unspecified injury of right wrist, hand and finger(s), initial encounter: Secondary | ICD-10-CM | POA: Diagnosis not present

## 2017-03-09 DIAGNOSIS — I10 Essential (primary) hypertension: Secondary | ICD-10-CM

## 2017-03-09 DIAGNOSIS — M25531 Pain in right wrist: Secondary | ICD-10-CM | POA: Diagnosis not present

## 2017-03-09 MED ORDER — HYDROCODONE-ACETAMINOPHEN 5-325 MG PO TABS: 1.0000 | ORAL_TABLET | Freq: Four times a day (QID) | ORAL | 0 refills | Status: DC | PRN

## 2017-03-09 MED ORDER — HYDROCODONE-ACETAMINOPHEN 5-325 MG PO TABS
2.0000 | ORAL_TABLET | Freq: Once | ORAL | Status: AC
  Administered 2017-03-09: 2 via ORAL
  Filled 2017-03-09: qty 2

## 2017-03-09 NOTE — ED Triage Notes (Signed)
Reports falling backwards catching all his weight on right hand.  Now c/o right wrist pain.  No deformity or swelling noted in triage.

## 2017-03-09 NOTE — Progress Notes (Signed)
Orthopedic Tech Progress Note Patient Details:  Marvin Roberson 12/23/1970 [id number]  Ortho Devices Type of Ortho Device: Rad Gutter splint Ortho Device/Splint Location: rue Ortho Device/Splint Interventions: Ordered, Application   Karolee Stamps 03/09/2017, 5:56 AM

## 2017-03-09 NOTE — ED Provider Notes (Signed)
TIME SEEN: 4:10 AM  CHIEF COMPLAINT: Right wrist pain  HPI: Patient is a 46 year old male with history of end-stage renal disease on hemodialysis Tuesday, Thursday and Saturday, hypertension, hyperlipidemia who presents emergency department with complaints of right wrist pain. Reports he was playing with his kids and slipped and fell injuring the right wrist. States he landed on his back but denies any neck or back pain. No chest or abdominal pain. He is also extremely hypertensive here which is his baseline per his report and on review of his recent records shows that his blood pressure is this elevated chronically. He denies headache, vision changes, numbness, tingling, focal weakness, chest pain or shortness of breath. Reports compliance with his Coreg, losartan, hydralazine. Reports compliance with his dialysis.  ROS: See HPI Constitutional: no fever  Eyes: no drainage  ENT: no runny nose   Cardiovascular:  no chest pain  Resp: no SOB  GI: no vomiting GU: no dysuria Integumentary: no rash  Allergy: no hives  Musculoskeletal: no leg swelling  Neurological: no slurred speech ROS otherwise negative  PAST MEDICAL HISTORY/PAST SURGICAL HISTORY:  Past Medical History:  Diagnosis Date  . Adrenal insufficiency ([place])   . Dyspnea    "when I have too much fluid"  . ESRD on dialysis [facility]) since 1990s   "TTS; [address]" (11/27/2016)  . GERD (gastroesophageal reflux disease)   . High cholesterol   . History of blood transfusion   . Hypertension   . Kidney failure   . Renal insufficiency   . Stroke [facility]) 2016   decreased vision in his left eye/notes 11/27/2016    MEDICATIONS:  Prior to Admission medications   Medication Sig Start Date End Date Taking? Authorizing Provider  aspirin EC 81 MG tablet Take 81 mg by mouth daily.    [provider]  atorvastatin (LIPITOR) 40 MG tablet Take 40 mg by mouth daily.    [provider]  carvedilol (COREG) 25 MG tablet Take 25  mg by mouth 2 (two) times daily with a meal.    [provider]  cefTAZidime 2 g in dextrose 5 % 50 mL Inject 2 g into the vein every Tuesday, Thursday, and Saturday at 6 PM. 12/14/16   Virgina Jock A, PA-C  hydrALAZINE (APRESOLINE) 100 MG tablet Take 100 mg by mouth 3 (three) times daily.    [provider]  hydrocortisone (CORTEF) 5 MG tablet Take 3 tablets (15 mg) every morning 11/29/16   Ledell Noss, MD  hydrocortisone (CORTEF) 5 MG tablet Take 1 tablet (5 mg total) by mouth every evening. 11/29/16   Ledell Noss, MD  losartan (COZAAR) 100 MG tablet Take 100 mg by mouth daily.    [provider]  omeprazole (PRILOSEC) 40 MG capsule Take 40 mg by mouth daily.    [provider]  oxyCODONE-acetaminophen (PERCOCET/ROXICET) 5-325 MG tablet Take 1-2 tablets by mouth every 6 (six) hours as needed for moderate pain. Patient not taking: Reported on 01/16/2017 12/13/16   Alvia Grove, PA-C  Vancomycin [facility]) 750-5 MG/150ML-% SOLN Inject 150 mLs (750 mg total) into the vein Every Tuesday,Thursday,and Saturday with dialysis. 12/14/16   Alvia Grove, PA-C    ALLERGIES:  Allergies  Allergen Reactions  . Gabapentin Other (See Comments)    Sleepiness, altered mental state  . Lisinopril Other (See Comments)    angioedema  . Betadine [Povidone Iodine] Rash    SOCIAL HISTORY:  Social History  Substance Use Topics  . Smoking status:  Current Every Day Smoker    Packs/day: 0.12    Years: 20.00    Types: Cigarettes  . Smokeless tobacco: Never Used     Comment: Less than 1/2 pk per day  . Alcohol use No    FAMILY HISTORY: No family history on file.  EXAM: BP (!) 202/127 (BP Location: Left Leg) Comment: taken on left leg due to fistula and port in arms  Pulse 76   Temp 97.9 F (36.6 C) (Oral)   Resp 16   Ht 5\' 8"  (1.727 m)   Wt 136 lb 8 oz (61.9 kg)   SpO2 99%   BMI 20.75 kg/m  CONSTITUTIONAL: Alert and oriented and responds appropriately to  questions. Well-appearing; well-nourished HEAD: Normocephalic, Atraumatic EYES: Conjunctivae clear, pupils appear equal, EOMI ENT: normal nose; moist mucous membranes NECK: Supple, no meningismus, no nuchal rigidity, no LAD, No midline sternal tenderness or step-off or deformity  CARD: RRR; S1 and S2 appreciated; no murmurs, no clicks, no rubs, no gallops RESP: Normal chest excursion without splinting or tachypnea; breath sounds clear and equal bilaterally; no wheezes, no rhonchi, no rales, no hypoxia or respiratory distress, speaking full sentences ABD/GI: Normal bowel sounds; non-distended; soft, non-tender, no rebound, no guarding, no peritoneal signs, no hepatosplenomegaly BACK:  The back appears normal and is non-tender to palpation, there is no CVA tenderness, no midline spinal tenderness or step-off or deformity EXT: Patient tender to palpation over the right scaphoid area without obvious bony deformity, swelling. Normal grip strength in the right hand. No tenderness over the right elbow or right shoulder. 2+ radial pulses bilaterally. Fistula in the left upper extremity that is nontender to palpation without erythema, warmth or swelling. Gracile Normal ROM in all joints; otherwise extremities are non-tender to palpation; no edema; normal capillary refill; no cyanosis, no calf tenderness or swelling    SKIN: Normal color for age and race; warm; no rash NEURO: Moves all extremities equally, sensation to light touch intact diffusely, cranial nerves II through XII intact PSYCH: [name] mood and manner are appropriate. Grooming and personal hygiene are appropriate.  MEDICAL DECISION MAKING: Patient here with mechanical fall with right wrist pain. X-rays are unremarkable. He is tender over the scaphoid so I will place him in a splint and have him follow-up for repeat x-ray in 1 week to evaluate for possible scaphoid injury that we are not seen today. He is extremely hypertensive but  asymptomatic from this. Does not appear volume overloaded. This appears to be his baseline. Reports compliance with his blood pressure medications. Have recommended close follow-up with his primary care physician for this. He thinks that part of the reason his blood pressure is elevated is because of pain from his rest. He does not want further workup and wants to be discharged home.  At this time, I do not feel there is any life-threatening condition present. I have reviewed and discussed all results (EKG, imaging, lab, urine as appropriate) and exam findings with patient/family. I have reviewed nursing notes and appropriate previous records.  I feel the patient is safe to be discharged home without further emergent workup and can continue workup as an outpatient as needed. Discussed usual and customary return precautions. Patient/family verbalize understanding and are comfortable with this plan.  Outpatient follow-up has been provided if needed. All questions have been answered.     SPLINT APPLICATION Date/Time: 7:61 AM Authorized by: Nyra Jabs Consent: Verbal consent obtained. Risks and benefits: risks, benefits and alternatives were  discussed Consent given by: patient Splint applied by: orthopedic technician Location details: Right wrist  Splint type: Radial gutter  Supplies used: Fiberglass Post-procedure: The splinted body part was neurovascularly unchanged following the procedure. Patient tolerance: Patient tolerated the procedure well with no immediate complications.       Jarom Govan, Delice Bison, DO 03/09/17 [phone]

## 2017-03-09 NOTE — Discharge Instructions (Signed)
Please keep your wrist splint on for the next week. You will need a repeat x-ray in one week.

## 2017-03-09 NOTE — ED Notes (Signed)
Pt reports he fell on some concrete at 1700 on 03/08/17.

## 2017-03-10 DIAGNOSIS — D631 Anemia in chronic kidney disease: Secondary | ICD-10-CM | POA: Diagnosis not present

## 2017-03-10 DIAGNOSIS — N186 End stage renal disease: Secondary | ICD-10-CM | POA: Diagnosis not present

## 2017-03-10 DIAGNOSIS — N2581 Secondary hyperparathyroidism of renal origin: Secondary | ICD-10-CM | POA: Diagnosis not present

## 2017-03-10 DIAGNOSIS — D509 Iron deficiency anemia, unspecified: Secondary | ICD-10-CM | POA: Diagnosis not present

## 2017-03-11 ENCOUNTER — Encounter (HOSPITAL_COMMUNITY): Payer: Self-pay

## 2017-03-11 ENCOUNTER — Emergency Department (HOSPITAL_COMMUNITY)
Admission: EM | Admit: 2017-03-11 | Discharge: 2017-03-11 | Disposition: A | Discharge status: Home / Self Care | Payer: Medicare Other | Attending: Emergency Medicine | Admitting: Emergency Medicine

## 2017-03-11 DIAGNOSIS — F17210 Nicotine dependence, cigarettes, uncomplicated: Secondary | ICD-10-CM | POA: Diagnosis not present

## 2017-03-11 DIAGNOSIS — Z8673 Personal history of transient ischemic attack (TIA), and cerebral infarction without residual deficits: Secondary | ICD-10-CM | POA: Insufficient documentation

## 2017-03-11 DIAGNOSIS — Z7982 Long term (current) use of aspirin: Secondary | ICD-10-CM | POA: Insufficient documentation

## 2017-03-11 DIAGNOSIS — Z992 Dependence on renal dialysis: Secondary | ICD-10-CM | POA: Insufficient documentation

## 2017-03-11 DIAGNOSIS — Z79899 Other long term (current) drug therapy: Secondary | ICD-10-CM | POA: Diagnosis not present

## 2017-03-11 DIAGNOSIS — S6991XD Unspecified injury of right wrist, hand and finger(s), subsequent encounter: Secondary | ICD-10-CM | POA: Diagnosis not present

## 2017-03-11 DIAGNOSIS — W19XXXD Unspecified fall, subsequent encounter: Secondary | ICD-10-CM | POA: Insufficient documentation

## 2017-03-11 DIAGNOSIS — I120 Hypertensive chronic kidney disease with stage 5 chronic kidney disease or end stage renal disease: Secondary | ICD-10-CM | POA: Insufficient documentation

## 2017-03-11 DIAGNOSIS — N186 End stage renal disease: Secondary | ICD-10-CM | POA: Insufficient documentation

## 2017-03-11 NOTE — ED Notes (Signed)
Pt. Was told he does not have a break in his wrist and he does not understand why he had a splint applied

## 2017-03-11 NOTE — Progress Notes (Signed)
Orthopedic Tech Progress Note Patient Details:  Marvin Roberson 08/27/1971 [id number]  Ortho Devices Type of Ortho Device: Thumb velcro splint Ortho Device/Splint Location: Applied Thumb velcro splint to pt right hand.  pt tolerated well.  right hand. Ortho Device/Splint Interventions: Application, Adjustment   Kristopher Oppenheim 03/11/2017, 12:09 PM

## 2017-03-11 NOTE — ED Triage Notes (Signed)
Pt. Arrived with a splint on his rt. Forearm which was placed on Saturday.  Pt. Would like the splint removed due to unable to use his arm.  Pt. Has no swelling noted to his fingers.  The pain has improved.

## 2017-03-11 NOTE — ED Notes (Signed)
Ortho tech responded.

## 2017-03-11 NOTE — ED Notes (Signed)
Pt was apparently placed in an UGS due to tenderness over scaphoid area. Pt states he has limited use of his left hand so is now, unable to care for himself adequately.

## 2017-03-11 NOTE — ED Notes (Signed)
Ortho tech paged again

## 2017-03-11 NOTE — Discharge Instructions (Signed)
Please read instructions below. You can take Tylenol as needed for pain. Apply ice to your wrist for 20 minutes at a time. Keep the brace on at all times. Call Dr. Jess Barters office for an appointment on Monday for repeat x-ray of your wrist. Return to the ER for numbness/tingling in your hand, or new or worsening symptoms.

## 2017-03-11 NOTE — ED Notes (Signed)
Ortho tech paged  

## 2017-03-11 NOTE — ED Provider Notes (Signed)
[facility] DEPT Provider Note   CSN: [id number] Arrival date & time: 03/11/17  1324   By signing my name below, I, Marvin Roberson, attest that this documentation has been prepared under the direction and in the presence of [name], PA-C. Electronically Signed: Evelene Roberson, Scribe. 03/11/2017. 12:03 PM.  History   Chief Complaint Chief Complaint  Patient presents with  . Arm Injury    The history is provided by the patient. No language interpreter was used.     HPI Comments:  Marvin Roberson is a 46 y.o. male with a history of ESRD on hemodialysis T/Th/Sat, HTN, CVA, who presents to the Emergency Department complaining of gradually improving, right wrist pain s/p fall 4 days ago. Pt was seen in the ED following the fall on 03/09/2017. He had positive snuffbox tenderness on exam but a negative XR. Right wrist splint was applied. He has been taking hydrocodone with moderate relief. Pt notes difficulty completing ADLs such as feeding and bathing due to the splint. He notes pre-existing issues with the LUE which makes him more dependent of his RUE. He states he would like the splint removed and if it is not removed in the ED he will remove it himself. Pt has called ortho but cannot get an appointment until 03/25/2017. No acute numbness/tingling in the fingers. No re-injury or fall. States he has not taken his BP medication today.  Past Medical History:  Diagnosis Date  . Adrenal insufficiency ([place])   . Dyspnea    "when I have too much fluid"  . ESRD on dialysis [facility]) since 1990s   "TTS; [address]" (11/27/2016)  . GERD (gastroesophageal reflux disease)   . High cholesterol   . History of blood transfusion   . Hypertension   . Kidney failure   . Renal insufficiency   . Stroke [facility]) 2016   decreased vision in his left eye/notes 11/27/2016    Patient Active Problem List   Diagnosis Date Noted  . Post-operative complication 40/08/2724  . 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[place])   . ESRD on  hemodialysis ([place])   . 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 Adrenal crisis ([place])   . Hypoglycemia 11/27/2016  . ESRD (end stage renal disease) ([place]) 11/27/2016  . Flu-like symptoms 11/27/2016  . Nausea & vomiting 11/27/2016  . Diarrhea 11/27/2016  . Hypertension 11/27/2016  . History of CVA (cerebrovascular accident) 11/27/2016  . Monoclonal gammopathy of unknown significance (MGUS) 11/27/2016    Past Surgical History:  Procedure Laterality Date  . ANGIOPLASTY Left 12/11/2016   Procedure: ANGIOPLASTY LEFT ARM & SUBCLAVIAN ARTERY;  Surgeon: Conrad [place], MD;  Location: [facility];  Service: Vascular;  Laterality: Left;  . arm surgery Left 2016   "for aneurysm"  . AV FISTULA PLACEMENT    . FALSE ANEURYSM REPAIR Left 12/11/2016   Procedure: RESECTION  BRACHIAL ARTERY;  Surgeon: Conrad Gambrills, MD;  Location: [place];  Service: Vascular;  Laterality: Left;  . INSERTION OF DIALYSIS CATHETER Right    chest  . THROMBECTOMY BRACHIAL ARTERY  12/11/2016   Procedure: THROMBECTOMY BRACHIAL ARTERY AND ULNAR;  Surgeon: Conrad [place], MD;  Location: [facility];  Service: Vascular;;  . THYROIDECTOMY    . UPPER EXTREMITY ANGIOGRAM Left 12/11/2016   Procedure: LEFT ARM ANGIOGRAM;  Surgeon: Conrad , MD;  Location: [facility];  Service: Vascular;  Laterality: Left;  . WOUND EXPLORATION Left 12/11/2016   Procedure: LEFT ARM BRACHIAL ARTERY WITH INTERPOSTIONAL GRAFT;  Surgeon: Jannette Fogo  Bridgett Larsson, MD;  Location: [facility] OR;  Service: Vascular;  Laterality: Left;       Home Medications    Prior to Admission medications   Medication Sig Start Date End Date Taking? Authorizing Provider  aspirin EC 81 MG tablet Take 81 mg by mouth daily.    [provider]  atorvastatin (LIPITOR) 40 MG tablet Take 40 mg by mouth daily.    [provider]  carvedilol (COREG) 25 MG tablet Take 25 mg by mouth 2 (two) times daily with a meal.    [provider]  cefTAZidime 2 g in dextrose 5 % 50 mL Inject 2 g  into the vein every Tuesday, Thursday, and Saturday at 6 PM. 12/14/16   Virgina Jock A, PA-C  hydrALAZINE (APRESOLINE) 100 MG tablet Take 100 mg by mouth 3 (three) times daily.    [provider]  HYDROcodone-acetaminophen (NORCO/VICODIN) 5-325 MG tablet Take 1-2 tablets by mouth every 6 (six) hours as needed. 03/09/17   Ward, Delice Bison, DO  hydrocortisone (CORTEF) 5 MG tablet Take 3 tablets (15 mg) every morning 11/29/16   Ledell Noss, MD  hydrocortisone (CORTEF) 5 MG tablet Take 1 tablet (5 mg total) by mouth every evening. 11/29/16   Ledell Noss, MD  losartan (COZAAR) 100 MG tablet Take 100 mg by mouth daily.    [provider]  omeprazole (PRILOSEC) 40 MG capsule Take 40 mg by mouth daily.    [provider]  oxyCODONE-acetaminophen (PERCOCET/ROXICET) 5-325 MG tablet Take 1-2 tablets by mouth every 6 (six) hours as needed for moderate pain. Patient not taking: Reported on 01/16/2017 12/13/16   Alvia Grove, PA-C  Vancomycin [facility]) 750-5 MG/150ML-% SOLN Inject 150 mLs (750 mg total) into the vein Every Tuesday,Thursday,and Saturday with dialysis. 12/14/16   Alvia Grove, PA-C    Family History No family history on file.  Social History Social History  Substance Use Topics  . Smoking status: Current Every Day Smoker    Packs/day: 0.12    Years: 20.00    Types: Cigarettes  . Smokeless tobacco: Never Used     Comment: Less than 1/2 pk per day  . Alcohol use No     Allergies   Gabapentin; Lisinopril; and Betadine [povidone iodine]   Review of Systems Review of Systems  Musculoskeletal: Positive for arthralgias and myalgias.  Neurological: Negative for weakness and numbness.     Physical Exam Updated Vital Signs BP (!) 194/125 (BP Location: Left Leg)   Pulse 75   Temp 98.6 F (37 C) (Oral)   Resp 18   Ht 5\' 8"  (1.727 m)   Wt 61.2 kg   SpO2 100%   BMI 20.53 kg/m   Physical Exam  Constitutional: He appears well-developed and  well-nourished.  HENT:  Head: Normocephalic and atraumatic.  Eyes: Conjunctivae are normal.  Cardiovascular: Normal rate.   Pulmonary/Chest: Effort normal.  Musculoskeletal:  Right distal forearm with mild edema. Distal forearm with tenderness on the dorsal aspect, anatomical snuffbox tenderness, tenderness over dorsal carpal bones. No ecchymosis. Scar noted right forearm from previous fistula.  Neurological: No sensory deficit.  Psychiatric: He has a normal mood and affect. His behavior is normal.  Nursing note and vitals reviewed.    ED Treatments / Results  DIAGNOSTIC STUDIES:  Oxygen Saturation is 100% on RA, normal by my interpretation.    COORDINATION OF CARE:  11:04 AM Discussed treatment plan with pt at bedside and pt agreed to plan.  Labs (all  labs ordered are listed, but only abnormal results are displayed) Labs Reviewed - No data to display  EKG  EKG Interpretation None       Radiology No results found.  Procedures Procedures (including critical care time)  Medications Ordered in ED Medications - No data to display   Initial Impression / Assessment and Plan / ED Course  I have reviewed the triage vital signs and the nursing notes.  Pertinent labs & imaging results that were available during my care of the patient were reviewed by me and considered in my medical decision making (see chart for details).      Pt reporting today for issues w splint preventing his ADLs. Pt s/p mechanical fall on outstretch hand that occurred on 03/09/2017. Patient X-Ray from 03/09/2017 negative for obvious fracture or dislocation. Fiberglass splint applied during that visit for snuffbox tenderness. No new injuries noted. Removed fiberglass splint, applied velcro thumb spica splint. Will give pt ortho referral to another clinic with intention of getting a sooner appointment. Pt also with HTN this visit; reports he did not take his medication today; he is asx. Conservative therapy  recommended and discussed. Patient will be discharged home & is agreeable with above plan. Returns precautions discussed. Pt appears safe for discharge.  Patient discussed with Dr. Billy Fischer. Discussed results, findings, treatment and follow up. Patient advised of return precautions. Patient verbalized understanding and agreed with plan.] Final Clinical Impressions(s) / ED Diagnoses   Final diagnoses:  Right wrist injury, subsequent encounter    New Prescriptions Discharge Medication List as of 03/11/2017 11:52 AM     I personally performed the services described in this documentation, which was scribed in my presence. The recorded information has been reviewed and is accurate.     Russo, Martin[place] N, PA-C 03/11/17 1203    Gareth Morgan, MD 03/13/17 2208

## 2017-03-12 DIAGNOSIS — N186 End stage renal disease: Secondary | ICD-10-CM | POA: Diagnosis not present

## 2017-03-12 DIAGNOSIS — D509 Iron deficiency anemia, unspecified: Secondary | ICD-10-CM | POA: Diagnosis not present

## 2017-03-12 DIAGNOSIS — D631 Anemia in chronic kidney disease: Secondary | ICD-10-CM | POA: Diagnosis not present

## 2017-03-12 DIAGNOSIS — N2581 Secondary hyperparathyroidism of renal origin: Secondary | ICD-10-CM | POA: Diagnosis not present

## 2017-03-13 DIAGNOSIS — N186 End stage renal disease: Secondary | ICD-10-CM | POA: Diagnosis not present

## 2017-03-13 DIAGNOSIS — D509 Iron deficiency anemia, unspecified: Secondary | ICD-10-CM | POA: Diagnosis not present

## 2017-03-13 DIAGNOSIS — D631 Anemia in chronic kidney disease: Secondary | ICD-10-CM | POA: Diagnosis not present

## 2017-03-13 DIAGNOSIS — N2581 Secondary hyperparathyroidism of renal origin: Secondary | ICD-10-CM | POA: Diagnosis not present

## 2017-03-17 DIAGNOSIS — N2581 Secondary hyperparathyroidism of renal origin: Secondary | ICD-10-CM | POA: Diagnosis not present

## 2017-03-17 DIAGNOSIS — D509 Iron deficiency anemia, unspecified: Secondary | ICD-10-CM | POA: Diagnosis not present

## 2017-03-17 DIAGNOSIS — N186 End stage renal disease: Secondary | ICD-10-CM | POA: Diagnosis not present

## 2017-03-17 DIAGNOSIS — D631 Anemia in chronic kidney disease: Secondary | ICD-10-CM | POA: Diagnosis not present

## 2017-03-19 DIAGNOSIS — D631 Anemia in chronic kidney disease: Secondary | ICD-10-CM | POA: Diagnosis not present

## 2017-03-19 DIAGNOSIS — D509 Iron deficiency anemia, unspecified: Secondary | ICD-10-CM | POA: Diagnosis not present

## 2017-03-19 DIAGNOSIS — N2581 Secondary hyperparathyroidism of renal origin: Secondary | ICD-10-CM | POA: Diagnosis not present

## 2017-03-19 DIAGNOSIS — N186 End stage renal disease: Secondary | ICD-10-CM | POA: Diagnosis not present

## 2017-03-21 DIAGNOSIS — D631 Anemia in chronic kidney disease: Secondary | ICD-10-CM | POA: Diagnosis not present

## 2017-03-21 DIAGNOSIS — N186 End stage renal disease: Secondary | ICD-10-CM | POA: Diagnosis not present

## 2017-03-21 DIAGNOSIS — N2581 Secondary hyperparathyroidism of renal origin: Secondary | ICD-10-CM | POA: Diagnosis not present

## 2017-03-21 DIAGNOSIS — D509 Iron deficiency anemia, unspecified: Secondary | ICD-10-CM | POA: Diagnosis not present

## 2017-03-24 DIAGNOSIS — D631 Anemia in chronic kidney disease: Secondary | ICD-10-CM | POA: Diagnosis not present

## 2017-03-24 DIAGNOSIS — N2581 Secondary hyperparathyroidism of renal origin: Secondary | ICD-10-CM | POA: Diagnosis not present

## 2017-03-24 DIAGNOSIS — D509 Iron deficiency anemia, unspecified: Secondary | ICD-10-CM | POA: Diagnosis not present

## 2017-03-24 DIAGNOSIS — N186 End stage renal disease: Secondary | ICD-10-CM | POA: Diagnosis not present

## 2017-03-26 DIAGNOSIS — D631 Anemia in chronic kidney disease: Secondary | ICD-10-CM | POA: Diagnosis not present

## 2017-03-26 DIAGNOSIS — N186 End stage renal disease: Secondary | ICD-10-CM | POA: Diagnosis not present

## 2017-03-26 DIAGNOSIS — N2581 Secondary hyperparathyroidism of renal origin: Secondary | ICD-10-CM | POA: Diagnosis not present

## 2017-03-26 DIAGNOSIS — D509 Iron deficiency anemia, unspecified: Secondary | ICD-10-CM | POA: Diagnosis not present

## 2017-03-28 DIAGNOSIS — D631 Anemia in chronic kidney disease: Secondary | ICD-10-CM | POA: Diagnosis not present

## 2017-03-28 DIAGNOSIS — D509 Iron deficiency anemia, unspecified: Secondary | ICD-10-CM | POA: Diagnosis not present

## 2017-03-28 DIAGNOSIS — N186 End stage renal disease: Secondary | ICD-10-CM | POA: Diagnosis not present

## 2017-03-28 DIAGNOSIS — N2581 Secondary hyperparathyroidism of renal origin: Secondary | ICD-10-CM | POA: Diagnosis not present

## 2017-03-31 DIAGNOSIS — N2581 Secondary hyperparathyroidism of renal origin: Secondary | ICD-10-CM | POA: Diagnosis not present

## 2017-03-31 DIAGNOSIS — N186 End stage renal disease: Secondary | ICD-10-CM | POA: Diagnosis not present

## 2017-03-31 DIAGNOSIS — D631 Anemia in chronic kidney disease: Secondary | ICD-10-CM | POA: Diagnosis not present

## 2017-03-31 DIAGNOSIS — D509 Iron deficiency anemia, unspecified: Secondary | ICD-10-CM | POA: Diagnosis not present

## 2017-04-02 DIAGNOSIS — D509 Iron deficiency anemia, unspecified: Secondary | ICD-10-CM | POA: Diagnosis not present

## 2017-04-02 DIAGNOSIS — N186 End stage renal disease: Secondary | ICD-10-CM | POA: Diagnosis not present

## 2017-04-02 DIAGNOSIS — D631 Anemia in chronic kidney disease: Secondary | ICD-10-CM | POA: Diagnosis not present

## 2017-04-02 DIAGNOSIS — N2581 Secondary hyperparathyroidism of renal origin: Secondary | ICD-10-CM | POA: Diagnosis not present

## 2017-04-03 DIAGNOSIS — Z992 Dependence on renal dialysis: Secondary | ICD-10-CM | POA: Diagnosis not present

## 2017-04-03 DIAGNOSIS — N186 End stage renal disease: Secondary | ICD-10-CM | POA: Diagnosis not present

## 2017-04-03 DIAGNOSIS — I129 Hypertensive chronic kidney disease with stage 1 through stage 4 chronic kidney disease, or unspecified chronic kidney disease: Secondary | ICD-10-CM | POA: Diagnosis not present

## 2017-04-04 DIAGNOSIS — D631 Anemia in chronic kidney disease: Secondary | ICD-10-CM | POA: Diagnosis not present

## 2017-04-04 DIAGNOSIS — N186 End stage renal disease: Secondary | ICD-10-CM | POA: Diagnosis not present

## 2017-04-04 DIAGNOSIS — N2581 Secondary hyperparathyroidism of renal origin: Secondary | ICD-10-CM | POA: Diagnosis not present

## 2017-04-07 DIAGNOSIS — D631 Anemia in chronic kidney disease: Secondary | ICD-10-CM | POA: Diagnosis not present

## 2017-04-07 DIAGNOSIS — N186 End stage renal disease: Secondary | ICD-10-CM | POA: Diagnosis not present

## 2017-04-07 DIAGNOSIS — N2581 Secondary hyperparathyroidism of renal origin: Secondary | ICD-10-CM | POA: Diagnosis not present

## 2017-04-09 DIAGNOSIS — N186 End stage renal disease: Secondary | ICD-10-CM | POA: Diagnosis not present

## 2017-04-09 DIAGNOSIS — D631 Anemia in chronic kidney disease: Secondary | ICD-10-CM | POA: Diagnosis not present

## 2017-04-09 DIAGNOSIS — N2581 Secondary hyperparathyroidism of renal origin: Secondary | ICD-10-CM | POA: Diagnosis not present

## 2017-04-11 DIAGNOSIS — D631 Anemia in chronic kidney disease: Secondary | ICD-10-CM | POA: Diagnosis not present

## 2017-04-11 DIAGNOSIS — N186 End stage renal disease: Secondary | ICD-10-CM | POA: Diagnosis not present

## 2017-04-11 DIAGNOSIS — N2581 Secondary hyperparathyroidism of renal origin: Secondary | ICD-10-CM | POA: Diagnosis not present

## 2017-04-14 DIAGNOSIS — N186 End stage renal disease: Secondary | ICD-10-CM | POA: Diagnosis not present

## 2017-04-14 DIAGNOSIS — D631 Anemia in chronic kidney disease: Secondary | ICD-10-CM | POA: Diagnosis not present

## 2017-04-14 DIAGNOSIS — N2581 Secondary hyperparathyroidism of renal origin: Secondary | ICD-10-CM | POA: Diagnosis not present

## 2017-04-15 ENCOUNTER — Ambulatory Visit
Admission: RE | Admit: 2017-04-15 | Discharge: 2017-04-15 | Disposition: A | Discharge status: Home / Self Care | Payer: Medicare Other | Source: Ambulatory Visit | Attending: Nephrology | Admitting: Nephrology

## 2017-04-15 ENCOUNTER — Other Ambulatory Visit: Payer: Self-pay | Admitting: Nephrology

## 2017-04-15 DIAGNOSIS — Z111 Encounter for screening for respiratory tuberculosis: Secondary | ICD-10-CM | POA: Diagnosis not present

## 2017-04-16 DIAGNOSIS — N2581 Secondary hyperparathyroidism of renal origin: Secondary | ICD-10-CM | POA: Diagnosis not present

## 2017-04-16 DIAGNOSIS — N186 End stage renal disease: Secondary | ICD-10-CM | POA: Diagnosis not present

## 2017-04-16 DIAGNOSIS — D631 Anemia in chronic kidney disease: Secondary | ICD-10-CM | POA: Diagnosis not present

## 2017-04-18 DIAGNOSIS — N186 End stage renal disease: Secondary | ICD-10-CM | POA: Diagnosis not present

## 2017-04-18 DIAGNOSIS — D631 Anemia in chronic kidney disease: Secondary | ICD-10-CM | POA: Diagnosis not present

## 2017-04-18 DIAGNOSIS — N2581 Secondary hyperparathyroidism of renal origin: Secondary | ICD-10-CM | POA: Diagnosis not present

## 2017-04-21 DIAGNOSIS — N2581 Secondary hyperparathyroidism of renal origin: Secondary | ICD-10-CM | POA: Diagnosis not present

## 2017-04-21 DIAGNOSIS — N186 End stage renal disease: Secondary | ICD-10-CM | POA: Diagnosis not present

## 2017-04-21 DIAGNOSIS — D631 Anemia in chronic kidney disease: Secondary | ICD-10-CM | POA: Diagnosis not present

## 2017-04-24 DIAGNOSIS — N186 End stage renal disease: Secondary | ICD-10-CM | POA: Diagnosis not present

## 2017-04-26 DIAGNOSIS — N186 End stage renal disease: Secondary | ICD-10-CM | POA: Diagnosis not present

## 2017-04-28 DIAGNOSIS — N186 End stage renal disease: Secondary | ICD-10-CM | POA: Diagnosis not present

## 2017-04-28 DIAGNOSIS — N2581 Secondary hyperparathyroidism of renal origin: Secondary | ICD-10-CM | POA: Diagnosis not present

## 2017-04-28 DIAGNOSIS — D631 Anemia in chronic kidney disease: Secondary | ICD-10-CM | POA: Diagnosis not present

## 2017-04-30 DIAGNOSIS — N2581 Secondary hyperparathyroidism of renal origin: Secondary | ICD-10-CM | POA: Diagnosis not present

## 2017-04-30 DIAGNOSIS — N186 End stage renal disease: Secondary | ICD-10-CM | POA: Diagnosis not present

## 2017-04-30 DIAGNOSIS — D631 Anemia in chronic kidney disease: Secondary | ICD-10-CM | POA: Diagnosis not present

## 2017-05-02 DIAGNOSIS — N2581 Secondary hyperparathyroidism of renal origin: Secondary | ICD-10-CM | POA: Diagnosis not present

## 2017-05-02 DIAGNOSIS — N186 End stage renal disease: Secondary | ICD-10-CM | POA: Diagnosis not present

## 2017-05-02 DIAGNOSIS — D631 Anemia in chronic kidney disease: Secondary | ICD-10-CM | POA: Diagnosis not present

## 2017-05-03 DIAGNOSIS — Z992 Dependence on renal dialysis: Secondary | ICD-10-CM | POA: Diagnosis not present

## 2017-05-03 DIAGNOSIS — I129 Hypertensive chronic kidney disease with stage 1 through stage 4 chronic kidney disease, or unspecified chronic kidney disease: Secondary | ICD-10-CM | POA: Diagnosis not present

## 2017-05-03 DIAGNOSIS — N186 End stage renal disease: Secondary | ICD-10-CM | POA: Diagnosis not present

## 2017-05-05 DIAGNOSIS — D631 Anemia in chronic kidney disease: Secondary | ICD-10-CM | POA: Diagnosis not present

## 2017-05-05 DIAGNOSIS — N2581 Secondary hyperparathyroidism of renal origin: Secondary | ICD-10-CM | POA: Diagnosis not present

## 2017-05-05 DIAGNOSIS — N186 End stage renal disease: Secondary | ICD-10-CM | POA: Diagnosis not present

## 2017-05-07 DIAGNOSIS — D631 Anemia in chronic kidney disease: Secondary | ICD-10-CM | POA: Diagnosis not present

## 2017-05-07 DIAGNOSIS — N186 End stage renal disease: Secondary | ICD-10-CM | POA: Diagnosis not present

## 2017-05-07 DIAGNOSIS — N2581 Secondary hyperparathyroidism of renal origin: Secondary | ICD-10-CM | POA: Diagnosis not present

## 2017-05-09 DIAGNOSIS — N186 End stage renal disease: Secondary | ICD-10-CM | POA: Diagnosis not present

## 2017-05-09 DIAGNOSIS — D631 Anemia in chronic kidney disease: Secondary | ICD-10-CM | POA: Diagnosis not present

## 2017-05-09 DIAGNOSIS — N2581 Secondary hyperparathyroidism of renal origin: Secondary | ICD-10-CM | POA: Diagnosis not present

## 2017-05-12 DIAGNOSIS — N186 End stage renal disease: Secondary | ICD-10-CM | POA: Diagnosis not present

## 2017-05-12 DIAGNOSIS — D631 Anemia in chronic kidney disease: Secondary | ICD-10-CM | POA: Diagnosis not present

## 2017-05-12 DIAGNOSIS — N2581 Secondary hyperparathyroidism of renal origin: Secondary | ICD-10-CM | POA: Diagnosis not present

## 2017-05-14 ENCOUNTER — Encounter: Payer: Self-pay | Admitting: Vascular Surgery

## 2017-05-14 DIAGNOSIS — D631 Anemia in chronic kidney disease: Secondary | ICD-10-CM | POA: Diagnosis not present

## 2017-05-14 DIAGNOSIS — N186 End stage renal disease: Secondary | ICD-10-CM | POA: Diagnosis not present

## 2017-05-14 DIAGNOSIS — N2581 Secondary hyperparathyroidism of renal origin: Secondary | ICD-10-CM | POA: Diagnosis not present

## 2017-05-15 ENCOUNTER — Encounter (HOSPITAL_COMMUNITY): Payer: Self-pay

## 2017-05-15 ENCOUNTER — Emergency Department (HOSPITAL_COMMUNITY)
Admission: EM | Admit: 2017-05-15 | Discharge: 2017-05-15 | Disposition: A | Discharge status: Home / Self Care | Payer: Medicare Other | Attending: Emergency Medicine | Admitting: Emergency Medicine

## 2017-05-15 DIAGNOSIS — Z5321 Procedure and treatment not carried out due to patient leaving prior to being seen by health care provider: Secondary | ICD-10-CM | POA: Diagnosis not present

## 2017-05-15 DIAGNOSIS — R2232 Localized swelling, mass and lump, left upper limb: Secondary | ICD-10-CM | POA: Diagnosis not present

## 2017-05-15 NOTE — ED Notes (Signed)
Dr. Ralene Bathe aware of arm swelling and differences in radial pulses. No additional orders received at this time.

## 2017-05-15 NOTE — ED Notes (Signed)
No answer x 1 for room.

## 2017-05-15 NOTE — ED Notes (Signed)
No answer for room x2 

## 2017-05-15 NOTE — ED Notes (Signed)
No answer for room x3

## 2017-05-15 NOTE — ED Triage Notes (Signed)
Pt reports increased swelling to left arm x 1-2 months. Pt has old fistula to lef upper arm that is not in use. HE reports hx of complications from aneurysm to left arm that he is concerned about. Pt reports increased pain to arm as well. Radial pulses 2+on right, 1+ on left.

## 2017-05-16 DIAGNOSIS — D631 Anemia in chronic kidney disease: Secondary | ICD-10-CM | POA: Diagnosis not present

## 2017-05-16 DIAGNOSIS — N186 End stage renal disease: Secondary | ICD-10-CM | POA: Diagnosis not present

## 2017-05-16 DIAGNOSIS — N2581 Secondary hyperparathyroidism of renal origin: Secondary | ICD-10-CM | POA: Diagnosis not present

## 2017-05-19 DIAGNOSIS — D631 Anemia in chronic kidney disease: Secondary | ICD-10-CM | POA: Diagnosis not present

## 2017-05-19 DIAGNOSIS — N186 End stage renal disease: Secondary | ICD-10-CM | POA: Diagnosis not present

## 2017-05-19 DIAGNOSIS — N2581 Secondary hyperparathyroidism of renal origin: Secondary | ICD-10-CM | POA: Diagnosis not present

## 2017-05-21 DIAGNOSIS — N186 End stage renal disease: Secondary | ICD-10-CM | POA: Diagnosis not present

## 2017-05-21 DIAGNOSIS — N2581 Secondary hyperparathyroidism of renal origin: Secondary | ICD-10-CM | POA: Diagnosis not present

## 2017-05-21 DIAGNOSIS — D631 Anemia in chronic kidney disease: Secondary | ICD-10-CM | POA: Diagnosis not present

## 2017-05-23 ENCOUNTER — Ambulatory Visit (INDEPENDENT_AMBULATORY_CARE_PROVIDER_SITE_OTHER): Payer: Medicare Other | Admitting: Vascular Surgery

## 2017-05-23 ENCOUNTER — Encounter: Payer: Self-pay | Admitting: Vascular Surgery

## 2017-05-23 VITALS — BP 156/96 | HR 78 | Temp 97.8°F | Resp 16 | Ht 68.0 in | Wt 136.0 lb

## 2017-05-23 DIAGNOSIS — D631 Anemia in chronic kidney disease: Secondary | ICD-10-CM | POA: Diagnosis not present

## 2017-05-23 DIAGNOSIS — I721 Aneurysm of artery of upper extremity: Secondary | ICD-10-CM | POA: Diagnosis not present

## 2017-05-23 DIAGNOSIS — N186 End stage renal disease: Secondary | ICD-10-CM

## 2017-05-23 DIAGNOSIS — N2581 Secondary hyperparathyroidism of renal origin: Secondary | ICD-10-CM | POA: Diagnosis not present

## 2017-05-23 NOTE — Progress Notes (Signed)
Patient ID: Marvin Roberson, male   DOB: 1971/04/11, 46 y.o.   MRN: [id number]  Reason for Consult: Re-evaluation (worsening pain )   Referred by Donato Heinz, MD  Subjective:     HPI:  Marvin Roberson is a 46 y.o. male with a previous history of a left-sided axillary to brachial graft with PTFE performed at an outside facility. He then had an expanding pseudoaneurysm of this graft and replacement with Artegraft by Dr. Tacy Learn here. 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 He currently dialyzes via a right IJ tunneled catheter as he refuses to have right upper extremity AV access given his palpitations with the left. He is here today for evaluation of the pain in the left. He states the hand remains warm but that he does feel like the pain is getting worse specifically along the medial aspect of his upper arm. He does continue to dialyze via the catheter. He does have a history of a stroke.  Past Medical History:  Diagnosis Date  . Adrenal insufficiency ([place])   . Dyspnea    "when I have too much fluid"  . ESRD on dialysis [facility]) since 1990s   "TTS; [address]" (11/27/2016)  . GERD (gastroesophageal reflux disease)   . High cholesterol   . History of blood transfusion   . Hypertension   . Kidney failure   . Renal insufficiency   . Stroke [facility]) 2016   decreased vision in his left eye/notes 11/27/2016   No family history on file. Past Surgical History:  Procedure Laterality Date  . ANGIOPLASTY Left 12/11/2016   Procedure: ANGIOPLASTY LEFT ARM & SUBCLAVIAN ARTERY;  Surgeon: Conrad [place], MD;  Location: [facility];  Service: Vascular;  Laterality: Left;  . arm surgery Left 2016   "for aneurysm"  . AV FISTULA PLACEMENT    . FALSE ANEURYSM REPAIR Left 12/11/2016   Procedure: RESECTION  BRACHIAL ARTERY;  Surgeon: Conrad Romulus, MD;  Location: [facility];  Service: Vascular;  Laterality: Left;  . INSERTION OF DIALYSIS CATHETER Right      chest  . THROMBECTOMY BRACHIAL ARTERY  12/11/2016   Procedure: THROMBECTOMY BRACHIAL ARTERY AND ULNAR;  Surgeon: Conrad MacArthur, MD;  Location: [place];  Service: Vascular;;  . THYROIDECTOMY    . UPPER EXTREMITY ANGIOGRAM Left 12/11/2016   Procedure: LEFT ARM ANGIOGRAM;  Surgeon: Conrad [place], MD;  Location: [facility] [place];  Service: Vascular;  Laterality: Left;  . WOUND EXPLORATION Left 12/11/2016   Procedure: LEFT ARM BRACHIAL ARTERY WITH INTERPOSTIONAL GRAFT;  Surgeon: Conrad [place], MD;  Location: [place];  Service: Vascular;  Laterality: Left;    Short Social History:  Social History  Substance Use Topics  . Smoking status: Current Every Day Smoker    Packs/day: 0.12    Years: 20.00    Types: Cigarettes  . Smokeless tobacco: Never Used     Comment: Less than 1/2 pk per day  . Alcohol use No    Allergies  Allergen Reactions  . Codeine Nausea And Vomiting  . Gabapentin Other (See Comments)    Sleepiness, altered mental state  . Lisinopril Other (See Comments) and Swelling    angioedema  . Povidone-Iodine Itching  . Betadine [Povidone Iodine] Rash    Current Outpatient Prescriptions  Medication Sig Dispense Refill  . aspirin EC 81 MG tablet Take 81 mg by mouth daily.    Marland Kitchen atorvastatin (LIPITOR)  40 MG tablet Take 40 mg by mouth daily.    . carvedilol (COREG) 25 MG tablet Take 25 mg by mouth 2 (two) times daily with a meal.    . hydrALAZINE (APRESOLINE) 100 MG tablet Take 100 mg by mouth 3 (three) times daily.    . hydrocortisone (CORTEF) 5 MG tablet Take 3 tablets (15 mg) every morning (Patient taking differently: Take 5 mg by mouth 2 (two) times daily. Take 3 tablets (15 mg) every morning) 60 tablet 0  . losartan (COZAAR) 100 MG tablet Take 100 mg by mouth daily.    Marland Kitchen omeprazole (PRILOSEC) 40 MG capsule Take 40 mg by mouth daily.    . Vancomycin (VANCOCIN) 750-5 MG/150ML-% SOLN Inject 150 mLs (750 mg total) into the vein Every Tuesday,Thursday,and Saturday with dialysis. 4000 mL   .  cefTAZidime 2 g in dextrose 5 % 50 mL Inject 2 g into the vein every Tuesday, Thursday, and Saturday at 6 PM. (Patient not taking: Reported on 05/23/2017)    . HYDROcodone-acetaminophen (NORCO/VICODIN) 5-325 MG tablet Take 1-2 tablets by mouth every 6 (six) hours as needed. (Patient not taking: Reported on 05/23/2017) 15 tablet 0  . hydrocortisone (CORTEF) 5 MG tablet Take 1 tablet (5 mg total) by mouth every evening. (Patient not taking: Reported on 05/23/2017) 30 tablet 0  . oxyCODONE-acetaminophen (PERCOCET/ROXICET) 5-325 MG tablet Take 1-2 tablets by mouth every 6 (six) hours as needed for moderate pain. (Patient not taking: Reported on 01/16/2017) 50 tablet 0   No current facility-administered medications for this visit.     Review of Systems  Constitutional:  Constitutional negative. HENT: HENT negative.  Eyes: Eyes negative.  Respiratory: Positive for shortness of breath.  Cardiovascular: Positive for chest pain and dyspnea with exertion.  GI: Gastrointestinal negative.  Musculoskeletal: Musculoskeletal negative.       Arm pain Skin: Skin negative.  Neurological: Positive for dizziness, focal weakness and numbness.  Hematologic:       Blood clots Psychiatric: Psychiatric negative.        Objective:  Objective   Vitals:   05/23/17 1224 05/23/17 1231  BP: (!) 173/115 (!) 156/96  Pulse: 78 78  Resp: 16   Temp: 97.8 F (36.6 C)   SpO2: 100%   Weight: 136 lb (61.7 kg)   Height: 5\' 8"  (1.727 m)    Body mass index is 20.68 kg/m.  Physical Exam  Constitutional: He appears well-developed.  Eyes: Pupils are equal, round, and reactive to light.  Neck: Normal range of motion.  Cardiovascular:  Pulses:      Radial pulses are 2+ on the right side, and 2+ on the left side.  Pulmonary/Chest: Effort normal.  Abdominal: Soft.  Musculoskeletal:  Exquisitely tender to palpation left medial forearm and upper arm Left fingers with claw confiuguration  Skin: Skin is warm and dry.    Multiple well healed left upper arm incisions  Psychiatric: He has a normal mood and affect. His behavior is normal. Thought content normal.    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 He then had another complicated procedure by Dr. Bridgett Larsson with repair of the pseudoaneurysm with our graft in the left subclavian artery angioplasty. He now presents for recurrent pain in the left upper extremity. He does have an easily palpable radial artery pulse but the pain medial arm limits my  exam. I do not see any large pseudoaneurysms nothing appears infected and there is no erythema and the patient has no constitutional symptoms to suggest infection at this time. I discussed the case with Dr. Bridgett Larsson and he suggest the patient should return to [facility] for further evaluation. As such no imaging studies were ordered today and he can follow-up on a when necessary basis here with Dr. Bridgett Larsson.     Waynetta Sandy MD [facility]

## 2017-05-23 NOTE — Progress Notes (Signed)
Vitals:   05/23/17 1224  BP: (!) 173/115  Pulse: 78  Resp: 16  Temp: 97.8 F (36.6 C)  SpO2: 100%  Weight: 136 lb (61.7 kg)  Height: 5\' 8"  (1.727 m)

## 2017-05-24 ENCOUNTER — Inpatient Hospital Stay (HOSPITAL_COMMUNITY)
Admission: EM | Admit: 2017-05-24 | Discharge: 2017-05-26 | DRG: 643 | Disposition: A | Discharge status: Home / Self Care | Payer: Medicare Other | Attending: Internal Medicine | Admitting: Internal Medicine

## 2017-05-24 ENCOUNTER — Encounter (HOSPITAL_COMMUNITY): Payer: Self-pay | Admitting: *Deleted

## 2017-05-24 ENCOUNTER — Emergency Department (HOSPITAL_COMMUNITY): Payer: Medicare Other

## 2017-05-24 DIAGNOSIS — N2581 Secondary hyperparathyroidism of renal origin: Secondary | ICD-10-CM | POA: Diagnosis present

## 2017-05-24 DIAGNOSIS — Z992 Dependence on renal dialysis: Secondary | ICD-10-CM | POA: Diagnosis not present

## 2017-05-24 DIAGNOSIS — I721 Aneurysm of artery of upper extremity: Secondary | ICD-10-CM

## 2017-05-24 DIAGNOSIS — R509 Fever, unspecified: Secondary | ICD-10-CM | POA: Diagnosis present

## 2017-05-24 DIAGNOSIS — E7800 Pure hypercholesterolemia, unspecified: Secondary | ICD-10-CM | POA: Diagnosis present

## 2017-05-24 DIAGNOSIS — Z79899 Other long term (current) drug therapy: Secondary | ICD-10-CM

## 2017-05-24 DIAGNOSIS — F17210 Nicotine dependence, cigarettes, uncomplicated: Secondary | ICD-10-CM | POA: Diagnosis present

## 2017-05-24 DIAGNOSIS — Z7982 Long term (current) use of aspirin: Secondary | ICD-10-CM

## 2017-05-24 DIAGNOSIS — N186 End stage renal disease: Secondary | ICD-10-CM | POA: Diagnosis not present

## 2017-05-24 DIAGNOSIS — J811 Chronic pulmonary edema: Secondary | ICD-10-CM | POA: Diagnosis not present

## 2017-05-24 DIAGNOSIS — Z8673 Personal history of transient ischemic attack (TIA), and cerebral infarction without residual deficits: Secondary | ICD-10-CM

## 2017-05-24 DIAGNOSIS — I120 Hypertensive chronic kidney disease with stage 5 chronic kidney disease or end stage renal disease: Secondary | ICD-10-CM | POA: Diagnosis not present

## 2017-05-24 DIAGNOSIS — D631 Anemia in chronic kidney disease: Secondary | ICD-10-CM | POA: Diagnosis not present

## 2017-05-24 DIAGNOSIS — E2740 Unspecified adrenocortical insufficiency: Secondary | ICD-10-CM | POA: Diagnosis present

## 2017-05-24 DIAGNOSIS — K219 Gastro-esophageal reflux disease without esophagitis: Secondary | ICD-10-CM | POA: Diagnosis present

## 2017-05-24 DIAGNOSIS — E272 Addisonian crisis: Secondary | ICD-10-CM | POA: Diagnosis not present

## 2017-05-24 DIAGNOSIS — M79632 Pain in left forearm: Secondary | ICD-10-CM | POA: Diagnosis present

## 2017-05-24 LAB — COMPREHENSIVE METABOLIC PANEL
ALK PHOS: 72 U/L (ref 38–126)
ALT: 21 U/L (ref 17–63)
ANION GAP: 12 (ref 5–15)
AST: 24 U/L (ref 15–41)
Albumin: 3.1 g/dL — ABNORMAL LOW (ref 3.5–5.0)
BUN: 48 mg/dL — ABNORMAL HIGH (ref 6–20)
CALCIUM: 8.4 mg/dL — AB (ref 8.9–10.3)
CO2: 23 mmol/L (ref 22–32)
Chloride: 95 mmol/L — ABNORMAL LOW (ref 101–111)
Creatinine, Ser: 12.04 mg/dL — ABNORMAL HIGH (ref 0.61–1.24)
GFR, EST AFRICAN AMERICAN: 5 mL/min — AB (ref >60)
GFR, EST NON AFRICAN AMERICAN: 4 mL/min — AB (ref >60)
Glucose, Bld: 83 mg/dL (ref 65–99)
Potassium: 5.2 mmol/L — ABNORMAL HIGH (ref 3.5–5.1)
SODIUM: 130 mmol/L — AB (ref 135–145)
Total Bilirubin: 0.8 mg/dL (ref 0.3–1.2)
Total Protein: 8.2 g/dL — ABNORMAL HIGH (ref 6.5–8.1)

## 2017-05-24 LAB — CBC
HCT: 35.1 % — ABNORMAL LOW (ref 39.0–52.0)
Hemoglobin: 11.1 g/dL — ABNORMAL LOW (ref 13.0–17.0)
MCH: 29.6 pg (ref 26.0–34.0)
MCHC: 31.6 g/dL (ref 30.0–36.0)
MCV: 93.6 fL (ref 78.0–100.0)
Platelets: 166 10*3/uL (ref 150–400)
RBC: 3.75 MIL/uL — AB (ref 4.22–5.81)
RDW: 20.3 % — ABNORMAL HIGH (ref 11.5–15.5)
WBC: 5.9 10*3/uL (ref 4.0–10.5)

## 2017-05-24 LAB — URINALYSIS, ROUTINE W REFLEX MICROSCOPIC
BILIRUBIN URINE: NEGATIVE
Bacteria, UA: NONE SEEN
GLUCOSE, UA: 50 mg/dL — AB
HGB URINE DIPSTICK: NEGATIVE
KETONES UR: NEGATIVE mg/dL
LEUKOCYTES UA: NEGATIVE
NITRITE: NEGATIVE
PROTEIN: 30 mg/dL — AB
Specific Gravity, Urine: 1.005 (ref 1.005–1.030)
Squamous Epithelial / LPF: NONE SEEN
pH: 8 (ref 5.0–8.0)

## 2017-05-24 MED ORDER — SODIUM CHLORIDE 0.9 % IV BOLUS (SEPSIS)
500.0000 mL | Freq: Once | INTRAVENOUS | Status: AC
  Administered 2017-05-24: 500 mL via INTRAVENOUS

## 2017-05-24 MED ORDER — ACETAMINOPHEN 500 MG PO TABS
1000.0000 mg | ORAL_TABLET | Freq: Once | ORAL | Status: AC
  Administered 2017-05-24: 1000 mg via ORAL
  Filled 2017-05-24: qty 2

## 2017-05-24 NOTE — ED Triage Notes (Signed)
The pt  Is a dialysis pt that was dialyzed yesterday. He has had weakness dizziness for 3 days.  Hx of the same with dialysis..  Dialysis catheter in his rt upper chest.   Not eating for 3 days  This is also a chronic condition

## 2017-05-24 NOTE — ED Provider Notes (Addendum)
[facility] DEPT Provider Note   CSN: [id number] Arrival date & time: 05/24/17  1902     History   Chief Complaint Chief Complaint  Patient presents with  . Weakness    HPI Marvin Roberson is a 46 y.o. male.  Patient with end-stage renal disease on dialysis Monday weights and Friday presents with fever, weakness, chills, achiness, feeling cold.  He passes a small amount of urine. No cough or stiff neck. He complains of generalized malaise.  Decreased oral intake. Severity of symptoms moderate. He states he is typically admitted to the hospital with these symptoms.      Past Medical History:  Diagnosis Date  . Adrenal insufficiency ([place])   . Dyspnea    "when I have too much fluid"  . ESRD on dialysis [facility]) since 1990s   "TTS; [address]" (11/27/2016)  . GERD (gastroesophageal reflux disease)   . High cholesterol   . History of blood transfusion   . Hypertension   . Kidney failure   . Renal insufficiency   . Stroke [employer]) 2016   decreased vision in his left eye/notes 11/27/2016    Patient Active Problem List   Diagnosis Date Noted  . Post-operative complication 74/06/1447  . 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 ESRD on hemodialysis ([place])   . 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 Adrenal crisis ([place])   . Hypoglycemia 11/27/2016  . ESRD (end stage renal disease) ([place]) 11/27/2016  . Flu-like symptoms 11/27/2016  . Nausea & vomiting 11/27/2016  . Diarrhea 11/27/2016  . Hypertension 11/27/2016  . History of CVA (cerebrovascular accident) 11/27/2016  . Monoclonal gammopathy of unknown significance (MGUS) 11/27/2016    Past Surgical History:  Procedure Laterality Date  . ANGIOPLASTY Left 12/11/2016   Procedure: ANGIOPLASTY LEFT ARM & SUBCLAVIAN ARTERY;  Surgeon: Conrad [place], MD;  Location: [place];  Service: Vascular;  Laterality: Left;  . arm surgery Left 2016   "for aneurysm"  . AV FISTULA PLACEMENT    . FALSE ANEURYSM REPAIR Left 12/11/2016   Procedure:  RESECTION  BRACHIAL ARTERY;  Surgeon: Conrad Golconda, MD;  Location: [place];  Service: Vascular;  Laterality: Left;  . INSERTION OF DIALYSIS CATHETER Right    chest  . THROMBECTOMY BRACHIAL ARTERY  12/11/2016   Procedure: THROMBECTOMY BRACHIAL ARTERY AND ULNAR;  Surgeon: Conrad Cumberland, MD;  Location: [place];  Service: Vascular;;  . THYROIDECTOMY    . UPPER EXTREMITY ANGIOGRAM Left 12/11/2016   Procedure: LEFT ARM ANGIOGRAM;  Surgeon: Conrad Belfield, MD;  Location: [place];  Service: Vascular;  Laterality: Left;  . WOUND EXPLORATION Left 12/11/2016   Procedure: LEFT ARM BRACHIAL ARTERY WITH INTERPOSTIONAL GRAFT;  Surgeon: Conrad [place], MD;  Location: [place];  Service: Vascular;  Laterality: Left;       Home Medications    Prior to Admission medications   Medication Sig Start Date End Date Taking? Authorizing Provider  aspirin EC 81 MG tablet Take 81 mg by mouth daily.    [provider]  atorvastatin (LIPITOR) 40 MG tablet Take 40 mg by mouth daily.    [provider]  carvedilol (COREG) 25 MG tablet Take 25 mg by mouth 2 (two) times daily with a meal.    [provider]  cefTAZidime 2 g in dextrose 5 % 50 mL Inject 2 g into the vein every Tuesday, Thursday, and Saturday at 6 PM. Patient not taking: Reported on 05/23/2017 12/14/16   Alvia Grove,  PA-C  hydrALAZINE (APRESOLINE) 100 MG tablet Take 100 mg by mouth 3 (three) times daily.    [provider]  HYDROcodone-acetaminophen (NORCO/VICODIN) 5-325 MG tablet Take 1-2 tablets by mouth every 6 (six) hours as needed. Patient not taking: Reported on 05/23/2017 03/09/17   Ward, Delice Bison, DO  hydrocortisone (CORTEF) 5 MG tablet Take 3 tablets (15 mg) every morning Patient taking differently: Take 5 mg by mouth 2 (two) times daily. Take 3 tablets (15 mg) every morning 11/29/16   Ledell Noss, MD  hydrocortisone (CORTEF) 5 MG tablet Take 1 tablet (5 mg total) by mouth every evening. Patient not taking: Reported on 05/23/2017  11/29/16   Ledell Noss, MD  losartan (COZAAR) 100 MG tablet Take 100 mg by mouth daily.    [provider]  omeprazole (PRILOSEC) 40 MG capsule Take 40 mg by mouth daily.    [provider]  oxyCODONE-acetaminophen (PERCOCET/ROXICET) 5-325 MG tablet Take 1-2 tablets by mouth every 6 (six) hours as needed for moderate pain. Patient not taking: Reported on 01/16/2017 12/13/16   Alvia Grove, PA-C  Vancomycin [facility]) 750-5 MG/150ML-% SOLN Inject 150 mLs (750 mg total) into the vein Every Tuesday,Thursday,and Saturday with dialysis. 12/14/16   Alvia Grove, PA-C    Family History No family history on file.  Social History Social History  Substance Use Topics  . Smoking status: Current Every Day Smoker    Packs/day: 0.12    Years: 20.00    Types: Cigarettes  . Smokeless tobacco: Never Used     Comment: Less than 1/2 pk per day  . Alcohol use No     Allergies   Codeine; Gabapentin; Lisinopril; Povidone-iodine; and Betadine [povidone iodine]   Review of Systems Review of Systems  All other systems reviewed and are negative.    Physical Exam Updated Vital Signs BP (!) 185/131 (BP Location: Left Leg)   Pulse 92   Temp (!) 100.6 F (38.1 C) (Oral)   Resp 18   Ht 5\' 8"  (1.727 m)   Wt 62.6 kg (138 lb)   SpO2 97%   BMI 20.98 kg/m   Physical Exam  Constitutional: He is oriented to person, place, and time.  Febrile, nontoxic  HENT:  Head: Normocephalic and atraumatic.  Eyes: Conjunctivae are normal.  Neck: Neck supple.  Cardiovascular: Normal rate and regular rhythm.   Pulmonary/Chest: Effort normal and breath sounds normal.  Abdominal: Soft. Bowel sounds are normal.  Musculoskeletal: Normal range of motion.  Neurological: He is alert and oriented to person, place, and time.  Skin: Skin is warm and dry.  Psychiatric: He has a normal mood and affect. His behavior is normal.  Nursing note and vitals reviewed.    ED Treatments / Results   Labs (all labs ordered are listed, but only abnormal results are displayed) Labs Reviewed  COMPREHENSIVE METABOLIC PANEL - Abnormal; Notable for the following:       Result Value   Sodium 130 (*)    Potassium 5.2 (*)    Chloride 95 (*)    BUN 48 (*)    Creatinine, Ser 12.04 (*)    Calcium 8.4 (*)    Total Protein 8.2 (*)    Albumin 3.1 (*)    GFR calc non Af Amer 4 (*)    GFR calc Af Amer 5 (*)    All other components within normal limits  CBC - Abnormal; Notable for the following:    RBC 3.75 (*)  Hemoglobin 11.1 (*)    HCT 35.1 (*)    RDW 20.3 (*)    All other components within normal limits  CULTURE, BLOOD (ROUTINE X 2)  CULTURE, BLOOD (ROUTINE X 2)  URINALYSIS, ROUTINE W REFLEX MICROSCOPIC    EKG  EKG Interpretation None       Radiology Dg Chest 2 View  Result Date: 05/24/2017 CLINICAL DATA:  Dizziness, fever, nausea and vomiting since last evening. Patient is on dialysis. EXAM: CHEST  2 VIEW COMPARISON:  None. FINDINGS: The heart size and mediastinal contours are within normal limits. Both lungs are clear with minimal increase in interstitial edema. Right IJ dialysis catheter tip terminating at the cavoatrial junction is again seen with left axillary vascular stent again noted. The visualized skeletal structures are unremarkable. IMPRESSION: Mild interstitial edema of the lungs.  Otherwise negative exam. Electronically Signed   By: Ashley Royalty M.D.   On: 05/24/2017 21:17    Procedures Procedures (including critical care time)  Medications Ordered in ED Medications  sodium chloride 0.9 % bolus 500 mL (500 mLs Intravenous New Bag/Given 05/24/17 2220)  acetaminophen (TYLENOL) tablet 1,000 mg (1,000 mg Oral Given 05/24/17 2222)     Initial Impression / Assessment and Plan / ED Course  I have reviewed the triage vital signs and the nursing notes.  Pertinent labs & imaging results that were available during my care of the patient were reviewed by me and  considered in my medical decision making (see chart for details).     Patient has end-stage renal disease, fever, general malaise.  Heis slightly hyperkalemic with minimal interstitial fluid on CXR. Blood cultures obtained. IV normal saline 500 cc bolus given Will recommend admission.  Final Clinical Impressions(s) / ED Diagnoses   Final diagnoses:  ESRD (end stage renal disease) ([place])  Fever, unspecified fever cause    New Prescriptions New Prescriptions   No medications on file     Nat Christen, MD 05/24/17 2303    Nat Christen, MD 05/24/17 2312

## 2017-05-24 NOTE — ED Notes (Signed)
Pt reports he urinates about 2 times a day.  Unable to urinate at this time. Urinal left at bedside.

## 2017-05-24 NOTE — ED Notes (Signed)
Pt asked if he could use the bathroom. Pt stated, "Not right now."

## 2017-05-24 NOTE — ED Notes (Signed)
Patient is stable and ready to be transport to the floor at this time.  Report was called to [place].  Belongings taken with the patient to the floor.

## 2017-05-25 ENCOUNTER — Encounter (HOSPITAL_COMMUNITY): Payer: Self-pay | Admitting: Internal Medicine

## 2017-05-25 ENCOUNTER — Observation Stay (HOSPITAL_COMMUNITY): Payer: Medicare Other

## 2017-05-25 DIAGNOSIS — Z888 Allergy status to other drugs, medicaments and biological substances status: Secondary | ICD-10-CM | POA: Diagnosis not present

## 2017-05-25 DIAGNOSIS — M79632 Pain in left forearm: Secondary | ICD-10-CM | POA: Diagnosis present

## 2017-05-25 DIAGNOSIS — N2581 Secondary hyperparathyroidism of renal origin: Secondary | ICD-10-CM | POA: Diagnosis present

## 2017-05-25 DIAGNOSIS — I120 Hypertensive chronic kidney disease with stage 5 chronic kidney disease or end stage renal disease: Secondary | ICD-10-CM | POA: Diagnosis not present

## 2017-05-25 DIAGNOSIS — Z7982 Long term (current) use of aspirin: Secondary | ICD-10-CM | POA: Diagnosis not present

## 2017-05-25 DIAGNOSIS — E272 Addisonian crisis: Secondary | ICD-10-CM | POA: Diagnosis not present

## 2017-05-25 DIAGNOSIS — R509 Fever, unspecified: Secondary | ICD-10-CM

## 2017-05-25 DIAGNOSIS — R7881 Bacteremia: Secondary | ICD-10-CM | POA: Diagnosis not present

## 2017-05-25 DIAGNOSIS — F17210 Nicotine dependence, cigarettes, uncomplicated: Secondary | ICD-10-CM | POA: Diagnosis not present

## 2017-05-25 DIAGNOSIS — T82898S Other specified complication of vascular prosthetic devices, implants and grafts, sequela: Secondary | ICD-10-CM | POA: Diagnosis not present

## 2017-05-25 DIAGNOSIS — N186 End stage renal disease: Secondary | ICD-10-CM

## 2017-05-25 DIAGNOSIS — Z8673 Personal history of transient ischemic attack (TIA), and cerebral infarction without residual deficits: Secondary | ICD-10-CM | POA: Diagnosis not present

## 2017-05-25 DIAGNOSIS — I721 Aneurysm of artery of upper extremity: Secondary | ICD-10-CM

## 2017-05-25 DIAGNOSIS — D631 Anemia in chronic kidney disease: Secondary | ICD-10-CM | POA: Diagnosis not present

## 2017-05-25 DIAGNOSIS — Z79899 Other long term (current) drug therapy: Secondary | ICD-10-CM | POA: Diagnosis not present

## 2017-05-25 DIAGNOSIS — E7800 Pure hypercholesterolemia, unspecified: Secondary | ICD-10-CM | POA: Diagnosis present

## 2017-05-25 DIAGNOSIS — Z992 Dependence on renal dialysis: Secondary | ICD-10-CM | POA: Diagnosis not present

## 2017-05-25 DIAGNOSIS — K219 Gastro-esophageal reflux disease without esophagitis: Secondary | ICD-10-CM | POA: Diagnosis present

## 2017-05-25 MED ORDER — CARVEDILOL 25 MG PO TABS
25.0000 mg | ORAL_TABLET | Freq: Two times a day (BID) | ORAL | Status: DC
  Administered 2017-05-25 (×2): 25 mg via ORAL
  Filled 2017-05-25 (×3): qty 2

## 2017-05-25 MED ORDER — VANCOMYCIN HCL 10 G IV SOLR
1250.0000 mg | Freq: Once | INTRAVENOUS | Status: AC
  Administered 2017-05-25: 1250 mg via INTRAVENOUS
  Filled 2017-05-25: qty 1250

## 2017-05-25 MED ORDER — DEXTROSE 5 % IV SOLN
2.0000 g | INTRAVENOUS | Status: DC
  Filled 2017-05-25: qty 2

## 2017-05-25 MED ORDER — HYDRALAZINE HCL 50 MG PO TABS
100.0000 mg | ORAL_TABLET | Freq: Three times a day (TID) | ORAL | Status: DC
  Administered 2017-05-25 (×3): 100 mg via ORAL
  Filled 2017-05-25 (×3): qty 2

## 2017-05-25 MED ORDER — ONDANSETRON HCL 4 MG/2ML IJ SOLN
4.0000 mg | Freq: Four times a day (QID) | INTRAMUSCULAR | Status: DC | PRN
  Administered 2017-05-26: 4 mg via INTRAVENOUS
  Filled 2017-05-25: qty 2

## 2017-05-25 MED ORDER — HEPARIN SODIUM (PORCINE) 5000 UNIT/ML IJ SOLN: 5000.0000 [IU] | Freq: Three times a day (TID) | INTRAMUSCULAR | Status: DC

## 2017-05-25 MED ORDER — ONDANSETRON HCL 4 MG PO TABS: 4.0000 mg | ORAL_TABLET | Freq: Four times a day (QID) | ORAL | Status: DC | PRN

## 2017-05-25 MED ORDER — ATORVASTATIN CALCIUM 40 MG PO TABS
40.0000 mg | ORAL_TABLET | Freq: Every day | ORAL | Status: DC
  Administered 2017-05-25: 40 mg via ORAL
  Filled 2017-05-25: qty 1

## 2017-05-25 MED ORDER — CEFEPIME HCL 2 G IJ SOLR
2.0000 g | Freq: Once | INTRAMUSCULAR | Status: AC
  Administered 2017-05-25: 2 g via INTRAVENOUS
  Filled 2017-05-25: qty 2

## 2017-05-25 MED ORDER — MORPHINE SULFATE (PF) 2 MG/ML IV SOLN
1.0000 mg | INTRAVENOUS | Status: DC | PRN
  Administered 2017-05-25 – 2017-05-26 (×6): 1 mg via INTRAVENOUS
  Filled 2017-05-25 (×6): qty 1

## 2017-05-25 MED ORDER — DOXERCALCIFEROL 4 MCG/2ML IV SOLN
2.0000 ug | INTRAVENOUS | Status: DC
  Filled 2017-05-25: qty 2

## 2017-05-25 MED ORDER — VANCOMYCIN HCL IN DEXTROSE 750-5 MG/150ML-% IV SOLN
750.0000 mg | INTRAVENOUS | Status: DC
  Filled 2017-05-25 (×2): qty 150

## 2017-05-25 MED ORDER — ASPIRIN EC 81 MG PO TBEC
81.0000 mg | DELAYED_RELEASE_TABLET | Freq: Every day | ORAL | Status: DC
  Administered 2017-05-25: 81 mg via ORAL
  Filled 2017-05-25: qty 1

## 2017-05-25 MED ORDER — LOSARTAN POTASSIUM 50 MG PO TABS
100.0000 mg | ORAL_TABLET | Freq: Every day | ORAL | Status: DC
  Administered 2017-05-25: 100 mg via ORAL
  Filled 2017-05-25: qty 2

## 2017-05-25 MED ORDER — HYDROCORTISONE NA SUCCINATE PF 100 MG IJ SOLR
50.0000 mg | Freq: Three times a day (TID) | INTRAMUSCULAR | Status: DC
  Administered 2017-05-25 (×4): 50 mg via INTRAVENOUS
  Filled 2017-05-25 (×4): qty 2

## 2017-05-25 MED ORDER — ACETAMINOPHEN 325 MG PO TABS: 650.0000 mg | ORAL_TABLET | Freq: Four times a day (QID) | ORAL | Status: DC | PRN

## 2017-05-25 MED ORDER — ACETAMINOPHEN 650 MG RE SUPP: 650.0000 mg | Freq: Four times a day (QID) | RECTAL | Status: DC | PRN

## 2017-05-25 MED ORDER — PANTOPRAZOLE SODIUM 40 MG PO TBEC
40.0000 mg | DELAYED_RELEASE_TABLET | Freq: Every day | ORAL | Status: DC
Start: 2017-05-25 — End: 2017-05-26
  Administered 2017-05-25: 40 mg via ORAL
  Filled 2017-05-25: qty 1

## 2017-05-25 NOTE — Consult Note (Signed)
[facility] Renal Consultation Note    Indication for Consultation:  Management of ESRD/hemodialysis, anemia, hypertension/volume, and secondary hyperparathyroidism. PCP:  HPI: Marvin Roberson is a 46 y.o. 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  Pt seems upset to be here. Says that he has had ongoing L arm pain since 12/2016, worse recently and then developed fever/ chills and generalized aches at home which prompted ED eval. He denies any skin redness, warmth or drainage from L arm. Denies CP, abdominal pain. Says he has had mild dyspnea this whole week without cough or URI symptoms. + N/V x 2 days, without diarrhea. Vascular surgery has already evaluated him, felt that he unlikely has a graft infection, but they have ordered L arm CT to evaluate.  From renal standpoint, dialyzes MWF at [facility], last HD was 7/20. He did shorten his HD time on that day, and left 0.9kg above his EDW.  Past Medical History:  Diagnosis Date  . Adrenal insufficiency ([place])   . Dyspnea    "when I have too much fluid"  . ESRD on dialysis [facility]) since 1990s   "TTS; [address]" (11/27/2016)  . GERD (gastroesophageal reflux disease)   . High cholesterol   . History of blood transfusion   . Hypertension   . Kidney failure   . Renal insufficiency   . Stroke [facility]) 2016   decreased vision in his left eye/notes 11/27/2016   Past Surgical History:  Procedure Laterality Date  . ANGIOPLASTY Left 12/11/2016   Procedure: ANGIOPLASTY LEFT ARM & SUBCLAVIAN ARTERY;  Surgeon: Conrad [place], MD;  Location: [place];  Service: Vascular;  Laterality: Left;  . arm surgery Left 2016   "for aneurysm"  . AV FISTULA PLACEMENT    . FALSE ANEURYSM REPAIR Left  12/11/2016   Procedure: RESECTION  BRACHIAL ARTERY;  Surgeon: Conrad [place], MD;  Location: [place];  Service: Vascular;  Laterality: Left;  . INSERTION OF DIALYSIS CATHETER Right    chest  . THROMBECTOMY BRACHIAL ARTERY  12/11/2016   Procedure: THROMBECTOMY BRACHIAL ARTERY AND ULNAR;  Surgeon: Conrad Rafael Capo, MD;  Location: [place];  Service: Vascular;;  . THYROIDECTOMY    . UPPER EXTREMITY ANGIOGRAM Left 12/11/2016   Procedure: LEFT ARM ANGIOGRAM;  Surgeon: Conrad [place], MD;  Location: [place];  Service: Vascular;  Laterality: Left;  . WOUND EXPLORATION Left 12/11/2016   Procedure: LEFT ARM BRACHIAL ARTERY WITH INTERPOSTIONAL GRAFT;  Surgeon: Conrad [place], MD;  Location: [facility] OR;  Service: Vascular;  Laterality: Left;   Family History  Problem Relation Age of Onset  . Hypertension Other    Social History:  reports that he has been smoking Cigarettes.  He has a 2.40 pack-year smoking history. He has never used smokeless tobacco. He reports that he does not drink alcohol or use drugs.  ROS: As per HPI otherwise negative.  Physical Exam: Vitals:   05/25/17 0028 05/25/17 0455 05/25/17 0623 05/25/17 0852  BP: (!) 160/91 (!) 170/96  (!) 166/93  Pulse:  73  69  Resp:  (!) 24  20  Temp: (!) 100.4 F (38 C) 98 F (36.7 C)  97.7 F (36.5 C)  TempSrc: Oral Oral  Oral  SpO2:  99% 98% 99%  Weight:  Height:         General: Well developed, well nourished, in no acute distress. Head: Normocephalic, atraumatic, sclera non-icteric, mucus membranes are moist. Neck: Supple without lymphadenopathy/masses. JVD not elevated. Lungs: Clear bilaterally to auscultation in upper lobes, faint rales in L base only. Heart: RRR with normal S1, S2. No murmurs, rubs, or gallops appreciated. Abdomen: Soft, non-tender. Musculoskeletal:  Strength and tone appear normal for age. Lower extremities: No edema or ischemic changes, no open wounds. Neuro: Alert and oriented X 3. Moves all extremities spontaneously. Psych:   Responds to questions appropriately with a normal affect. Dialysis Access: TDC in R chest. L arm with extensive scarring from prior AVF; + tender to palpation. No erythema/warmth.  Allergies  Allergen Reactions  . Codeine Nausea And Vomiting  . Gabapentin Other (See Comments)    Sleepiness, altered mental state  . Lisinopril Other (See Comments) and Swelling    angioedema  . Povidone-Iodine Itching  . Betadine [Povidone Iodine] Rash   Prior to Admission medications   Medication Sig Start Date End Date Taking? Authorizing Provider  aspirin EC 81 MG tablet Take 81 mg by mouth daily.   Yes [provider]  atorvastatin (LIPITOR) 40 MG tablet Take 40 mg by mouth daily.   Yes [provider]  carvedilol (COREG) 25 MG tablet Take 25 mg by mouth 2 (two) times daily with a meal.   Yes [provider]  hydrALAZINE (APRESOLINE) 100 MG tablet Take 100 mg by mouth 3 (three) times daily.   Yes [provider]  HYDROcodone-acetaminophen (NORCO/VICODIN) 5-325 MG tablet Take 1-2 tablets by mouth every 6 (six) hours as needed. Patient taking differently: Take 1-2 tablets by mouth every 6 (six) hours as needed.  03/09/17  Yes Ward, Delice Bison, DO  hydrocortisone (CORTEF) 5 MG tablet Take 3 tablets (15 mg) every morning Patient taking differently: Take 5 mg by mouth 2 (two) times daily. Take 3 tablets (15 mg) every morning 11/29/16  Yes Ledell Noss, MD  losartan (COZAAR) 100 MG tablet Take 100 mg by mouth daily.   Yes [provider]  omeprazole (PRILOSEC) 40 MG capsule Take 40 mg by mouth daily.   Yes [provider]  oxyCODONE-acetaminophen (PERCOCET/ROXICET) 5-325 MG tablet Take 1-2 tablets by mouth every 6 (six) hours as needed for moderate pain. 12/13/16  Yes Alvia Grove, PA-C   Current Facility-Administered Medications  Medication Dose Route Frequency Provider Last Rate Last Dose  . acetaminophen (TYLENOL) tablet 650 mg  650 mg Oral Q6H PRN  Rise Patience, MD       Or  . acetaminophen (TYLENOL) suppository 650 mg  650 mg Rectal Q6H PRN Rise Patience, MD      . aspirin EC tablet 81 mg  81 mg Oral Daily Rise Patience, MD   81 mg at 05/25/17 0910  . atorvastatin (LIPITOR) tablet 40 mg  40 mg Oral Daily Rise Patience, MD   40 mg at 05/25/17 0910  . carvedilol (COREG) tablet 25 mg  25 mg Oral BID WC Rise Patience, MD   25 mg at 05/25/17 0909  . [START ON 05/26/2017] ceFEPIme (MAXIPIME) 2 g in dextrose 5 % 50 mL IVPB  2 g Intravenous Q M,W,F-2000 Erenest Blank, RPH      . heparin injection 5,000 Units  5,000 Units Subcutaneous Q8H Rise Patience, MD      . hydrALAZINE (APRESOLINE) tablet 100 mg  100 mg Oral TID Hal Hope,  Doreatha Lew, MD   100 mg at 05/25/17 0910  . hydrocortisone sodium succinate (SOLU-CORTEF) 100 MG injection 50 mg  50 mg Intravenous Q8H Rise Patience, MD   50 mg at 05/25/17 0600  . losartan (COZAAR) tablet 100 mg  100 mg Oral Daily Rise Patience, MD   100 mg at 05/25/17 0910  . morphine 2 MG/ML injection 1 mg  1 mg Intravenous Q4H PRN Rise Patience, MD   1 mg at 05/25/17 1013  . ondansetron (ZOFRAN) tablet 4 mg  4 mg Oral Q6H PRN Rise Patience, MD       Or  . ondansetron [facility]) injection 4 mg  4 mg Intravenous Q6H PRN Rise Patience, MD      . pantoprazole (PROTONIX) EC tablet 40 mg  40 mg Oral Daily Rise Patience, MD   40 mg at 05/25/17 8563  . [START ON 05/26/2017] vancomycin (VANCOCIN) IVPB 750 mg/150 ml premix  750 mg Intravenous Q M,W,F-HD Erenest Blank [facility]       Labs: Basic Metabolic Panel:  Recent Labs Lab 05/24/17 1913  NA 130*  K 5.2*  CL 95*  CO2 23  GLUCOSE 83  BUN 48*  CREATININE 12.04*  CALCIUM 8.4*   Liver Function Tests:  Recent Labs Lab 05/24/17 1913  AST 24  ALT 21  ALKPHOS 72  BILITOT 0.8  PROT 8.2*  ALBUMIN 3.1*   CBC:  Recent Labs Lab 05/24/17 1913  WBC 5.9  HGB 11.1*  HCT 35.1*   MCV 93.6  PLT 166   Studies/Results: Dg Chest 2 View  Result Date: 05/24/2017 CLINICAL DATA:  Dizziness, fever, nausea and vomiting since last evening. Patient is on dialysis. EXAM: CHEST  2 VIEW COMPARISON:  None. FINDINGS: The heart size and mediastinal contours are within normal limits. Both lungs are clear with minimal increase in interstitial edema. Right IJ dialysis catheter tip terminating at the cavoatrial junction is again seen with left axillary vascular stent again noted. The visualized skeletal structures are unremarkable. IMPRESSION: Mild interstitial edema of the lungs.  Otherwise negative exam. Electronically Signed   By: Ashley Royalty M.D.   On: 05/24/2017 21:17   Dialysis Orders:   MWF at [facility] 3:30hr, BFR 350/DFR800, 2K/2.5Ca, EDW 61.5kg, TDC, heparin 2000 bolus + 2000 mid-run bolus - Mircera 124mcg IV q 2 weeks (last given 7/16) - Hectoral 90mcg IV q HD  Assessment/Plan: 1.  Fever/myalgias: BCx 7/21 pending. Empirically started on Vanc/Cefepime. Awaiting L arm CT to eval for AVG infection. 2.  ESRD: No acute need for HD today. Will continue usual MWF schedule, will try to increase UF with next HD to target interstitial edema seen on CXR. 3.  Hypertension/volume: BP high with volume on CXR, plan as above. Continue home meds for now. 4.  Anemia:Hgb 11.1. Not due for ESA dosing yet. 5.  Metabolic bone disease: Ca ok. Continue Hectoral. Per OP notes, ?not on binder. Will check Phos. 6.  Nutrition:  Alb low, start Nepro. 7.  Adrenal insufficiency: On stress dose steroids currently.  Veneta Penton, PA-C 05/25/2017, 10:39 AM  [facility] Pager: [phone]

## 2017-05-25 NOTE — Progress Notes (Signed)
New Admission Note:   Arrival Method: Via stretcher from the ED Mental Orientation:  A & O x 4 Telemetry: Tele #[id number] Assessment: Completed Skin:  Dry but intact - Patient refused complete skin assessment IV:  RPFA Pain: C/O significant pain to left forearm Safety Measures: Safety Fall Prevention Plan has been given, discussed and signed Admission: Completed [place] Orientation: Patient has been orientated to the room, unit and staff.  Family:  None at bedside  Patient refused to be screened for MRSA.  He also has his clothing, cell phone, charger, and wallet at bedside.  He does not want his belongings to be put in the hospital safe.  He was educated on [employer] belonging policy.    Orders have been reviewed and implemented. Will continue to monitor the patient. Call light has been placed within reach and bed alarm has been activated.   Earleen Reaper RN- [place], [place] Phone number: [phone]

## 2017-05-25 NOTE — Consult Note (Addendum)
Established Previous Bypass   History of Present Illness   Marvin Roberson is a 46 y.o. ([birth year]) male who presents with chief complaint: body aches and fever.  This patient was admitted for concerns for bacteremia.  Patient has extensive surgical history at [facility] involving a mycotic pseudoaneurysm.  Reported he underwent a L axillary to brachial artery bypass with PTFE complicated by median nerve injury.  The patient presented locally with recurrence of the mycotic pseudoaneurysm vs aneurysmal degeneration of prior repair.  He was recommended to follow up with [facility] but he refused, so I offered him exploration of left forearm.  This turned into a resection of the pseudoaneurysm, interposition graft with Artegraft, and left subclavian artery angioplasty.  After recovery from this procedure, I again recommended he follow up with [facility] as the neointimal hyperplasia in the axillobrachial graft would require redo bypass in the future, likely with deep vein as I suspect he does not have adequate GSV otherwise the original bypass would have been done with such.  The patient was admitted with myalgias, nausea and vomitting and fevers.  This patient was seen recently in my office by my partner, who also recommended he go to [facility].  [name] PMH, PSH, SH, and FamHx are unchanged from 05/23/17.  Current Facility-Administered Medications  Medication Dose Route Frequency Provider Last Rate Last Dose  . acetaminophen (TYLENOL) tablet 650 mg  650 mg Oral Q6H PRN Rise Patience, MD       Or  . acetaminophen (TYLENOL) suppository 650 mg  650 mg Rectal Q6H PRN Rise Patience, MD      . aspirin EC tablet 81 mg  81 mg Oral Daily Rise Patience, MD      . atorvastatin (LIPITOR) tablet 40 mg  40 mg Oral Daily Rise Patience, MD      . carvedilol (COREG) tablet 25 mg  25 mg Oral BID WC Rise Patience, MD      . Derrill Memo ON 05/26/2017] ceFEPIme (MAXIPIME) 2 g in  dextrose 5 % 50 mL IVPB  2 g Intravenous Q M,W,F-2000 Erenest Blank, RPH      . heparin injection 5,000 Units  5,000 Units Subcutaneous Q8H Rise Patience, MD      . hydrALAZINE (APRESOLINE) tablet 100 mg  100 mg Oral TID Rise Patience, MD      . hydrocortisone sodium succinate (SOLU-CORTEF) 100 MG injection 50 mg  50 mg Intravenous Q8H Rise Patience, MD   50 mg at 05/25/17 0600  . losartan (COZAAR) tablet 100 mg  100 mg Oral Daily Rise Patience, MD      . morphine 2 MG/ML injection 1 mg  1 mg Intravenous Q4H PRN Rise Patience, MD   1 mg at 05/25/17 2706  . ondansetron (ZOFRAN) tablet 4 mg  4 mg Oral Q6H PRN Rise Patience, MD       Or  . ondansetron [facility]) injection 4 mg  4 mg Intravenous Q6H PRN Rise Patience, MD      . pantoprazole (PROTONIX) EC tablet 40 mg  40 mg Oral Daily Rise Patience, MD      . Derrill Memo ON 05/26/2017] vancomycin (VANCOCIN) IVPB 750 mg/150 ml premix  750 mg Intravenous Q M,W,F-HD Erenest Blank, RPH        On ROS today: +fever/chills, +myalagias   Physical Examination   Vitals:   05/24/17 2345 05/25/17 0028 05/25/17 0455 05/25/17  0623  BP: (!) 157/93 (!) 160/91 (!) 170/96   Pulse: 79  73   Resp:   (!) 24   Temp:  (!) 100.4 F (38 C) 98 F (36.7 C)   TempSrc:  Oral Oral   SpO2: 94%  99% 98%  Weight:      Height:       Body mass index is 20.98 kg/m.  General Alert, O x 3, WD, NAD  Pulmonary Sym exp, good B air movt, CTA B, RIJV TDC  Cardiac RRR, Nl S1, S2, no Murmurs, No rubs, No S3,S4  Vascular Vessel Right Left  Radial Palpable Palpable  Brachial Palpable Prominently palpable  Carotid Palpable, No Bruit Palpable, No Bruit  Aorta Not palpable N/A  Femoral Palpable Palpable  Popliteal Not palpable Not palpable  PT Palpable Palpable  DP Palpable Palpable    Gastro- intestinal soft, non-distended, non-tender to palpation, No guarding or rebound, no HSM, no masses, no CVAT B, No palpable  prominent aortic pulse,    Musculo- skeletal M/S 5/5 throughout except L arm: pt unwilling to externally rotate L arm, L hand grip 2-3/5, no erythema along graft visualized, Extremities without ischemic changes  , No edema present, No visible varicosities , No Lipodermatosclerosis present, prior ballotable fluid at recent surgical site resolved, prominent brachial pulse, all incisions well healed  Neurologic Pain and light touch intact in extremities , Motor exam as listed above    Laboratory   CBC CBC Latest Ref Rng & Units 05/24/2017 12/17/2016 12/17/2016  WBC 4.0 - 10.5 K/uL 5.9 - 5.2  Hemoglobin 13.0 - 17.0 g/dL 11.1(L) 8.2(L) 7.3(L)  Hematocrit 39.0 - 52.0 % 35.1(L) 24.0(L) 22.4(L)  Platelets 150 - 400 K/uL 166 - 131(L)    BMP BMP Latest Ref Rng & Units 05/24/2017 12/17/2016 12/12/2016  Glucose 65 - 99 mg/dL 83 92 77  BUN 6 - 20 mg/dL 48(H) 15 45(H)  Creatinine 0.61 - 1.24 mg/dL 12.04(H) 5.90(H) 9.31(H)  Sodium 135 - 145 mmol/L 130(L) 139 138  Potassium 3.5 - 5.1 mmol/L 5.2(H) 3.6 5.1  Chloride 101 - 111 mmol/L 95(L) 96(L) 104  CO2 22 - 32 mmol/L 23 - 21(L)  Calcium 8.9 - 10.3 mg/dL 8.4(L) - 6.3(LL)    Coagulation Lab Results  Component Value Date   INR 1.22 11/27/2016   No results found for: PTT  Lipids No results found for: CHOL, TRIG, HDL, CHOLHDL, VLDL, LDLCALC, LDLDIRECT   Medical Decision Making   Marvin Roberson is a 46 y.o. 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 Would go ahead and get a L arm CT w/ and w/o contrast to evaluate the soft tissue around the graft.  If any evidence of infection, will defer further management to [facility]. This has been the consistent recommendation to this patient from myself  and other partners in my practice, as multiple procedures have been done at that facility.   In the event, the axillobrachial graft needs to be resected and redone with vein, he likely would need deep vein as a conduit. As I have documented previously, I have no experience using deep vein and use of such is neither routine nor has been done at [facility] over the last 10 years.  Hence, the referral to  a tertiary center if resection and reconstruction is needed.  Thank you for allowing [place] to participate in this patient's care.   Adele Barthel, MD, FACS [facility] [place] Office: [phone] Pager: [phone]   Addendum  I reviewed the pt's non-contrast CT L arm.  I don't see any obvious evidence of infection of the graft or the recent pseudoaneurysm repair.    - Follow up on blood cultures, but I suspect likely to be negative - Pt needs to follow up with [facility] to re-establish care with them   Adele Barthel, MD, [facility] [place]: [phone] Pager: [phone]  05/25/2017, 6:12 PM

## 2017-05-25 NOTE — Progress Notes (Signed)
PROGRESS NOTE    Marvin Roberson  PTW:[id number] DOB: April 24, 1971 DOA: 05/24/2017 PCP: Donato Heinz, MD    Brief Narrative:  46 y.o. male with history of ESRD on hemodialysis, hypertension, adrenal insufficiency and history of infected mycotic aneurysm presents to the ER with complaints of generalized body ache. Patient states he is out of his hydrocortisone tablets last 4 days. And over the last 2 days he has been having generalized body ache with nausea vomiting and subjective feeling of fever and chills. Denies any chest pain shortness of breath cough productive sputum or diarrhea.   ED Course: In the ER patient was found to be febrile. Blood cultures were obtained. Chest x-ray was unremarkable. Patient is being admitted for further management. After admission patient was complaining of increasing pain in the left arm which is being followed by vascular surgery and was originally referred to [facility] where he had his first surgery.  Assessment & Plan:   Principal Problem:   Fever Active Problems:   Adrenal crisis ([place])   ESRD on hemodialysis ([place])  1. Fever with history of infected mycotic aneurysm 1. Blood cultures ordered, pending 2. Will continue on empiric vancomycin and cefepime. 2. Left forearm pain 1. Dr. Bridgett Larsson consulted. Appreciate input 2. CT arm ordered, pending to r/o infection 3. Per Vascular Surgery, if evidence of graft infection, then defer management to [facility] 3. Adrenal insufficiency  1. Pt continued on stress-dosed steroids 2. Appears stable at this time 4. Hypertension 1. Stable at present 2. Cont home medications 5. ESRD on hemodialysis on Monday Wednesday and Friday. 1. Nephrology consulted 2. No need for HD at this time 6. Anemia probably from ESRD 1. Hemodynamically stable at this time 2. Hgb 11.1  DVT prophylaxis: Heparin subQ Code Status: Full Family Communication: Pt in room, family not at bedside Disposition Plan: Uncertain at this  time  Consultants:   Vascular Surgery  Nephrology  Procedures:     Antimicrobials: Anti-infectives    Start     Dose/Rate Route Frequency Ordered Stop   05/26/17 2000  ceFEPIme (MAXIPIME) 2 g in dextrose 5 % 50 mL IVPB     2 g 100 mL/hr over 30 Minutes Intravenous Every M-W-F (2000) 05/25/17 0118     05/26/17 1200  vancomycin (VANCOCIN) IVPB 750 mg/150 ml premix     750 mg 150 mL/hr over 60 Minutes Intravenous Every M-W-F (Hemodialysis) 05/25/17 0118     05/25/17 0130  vancomycin (VANCOCIN) 1,250 mg in sodium chloride 0.9 % 250 mL IVPB     1,250 mg 166.7 mL/hr over 90 Minutes Intravenous  Once 05/25/17 0118 05/25/17 0409   05/25/17 0130  ceFEPIme (MAXIPIME) 2 g in dextrose 5 % 50 mL IVPB     2 g 100 mL/hr over 30 Minutes Intravenous  Once 05/25/17 0118 05/25/17 0408       Subjective: Without complaints  Objective: Vitals:   05/25/17 0028 05/25/17 0455 05/25/17 0623 05/25/17 0852  BP: (!) 160/91 (!) 170/96  (!) 166/93  Pulse:  73  69  Resp:  (!) 24  20  Temp: (!) 100.4 F (38 C) 98 F (36.7 C)  97.7 F (36.5 C)  TempSrc: Oral Oral  Oral  SpO2:  99% 98% 99%  Weight:      Height:        Intake/Output Summary (Last 24 hours) at 05/25/17 1455 Last data filed at 05/25/17 0900  Gross per 24 hour  Intake  660 ml  Output              100 ml  Net              560 ml   Filed Weights   05/24/17 1906  Weight: 62.6 kg (138 lb)    Examination: General exam: Appears calm and comfortable  Respiratory system: Clear to auscultation. Respiratory effort normal. Cardiovascular system: S1 & S2 heard, RRR. No JVD, murmurs, rubs, gallops or clicks. No pedal edema. Gastrointestinal system: Abdomen is nondistended, soft and nontender. No organomegaly or masses felt. Normal bowel sounds heard. Central nervous system: Alert and oriented. No focal neurological deficits. Extremities: Symmetric 5 x 5 power, L forearm graft site without significant overlying redness or  swelling. Skin: No rashes, lesions or ulcers Psychiatry: Judgement and insight appear normal. Mood & affect appropriate.   Data Reviewed: I have personally reviewed following labs and imaging studies  CBC:  Recent Labs Lab 05/24/17 1913  WBC 5.9  HGB 11.1*  HCT 35.1*  MCV 93.6  PLT 825   Basic Metabolic Panel:  Recent Labs Lab 05/24/17 1913  NA 130*  K 5.2*  CL 95*  CO2 23  GLUCOSE 83  BUN 48*  CREATININE 12.04*  CALCIUM 8.4*   GFR: Estimated Creatinine Clearance: 6.9 mL/min (A) (by C-G formula based on SCr of 12.04 mg/dL (H)). Liver Function Tests:  Recent Labs Lab 05/24/17 1913  AST 24  ALT 21  ALKPHOS 72  BILITOT 0.8  PROT 8.2*  ALBUMIN 3.1*   No results for input(s): LIPASE, AMYLASE in the last 168 hours. No results for input(s): AMMONIA in the last 168 hours. Coagulation Profile: No results for input(s): INR, PROTIME in the last 168 hours. Cardiac Enzymes: No results for input(s): CKTOTAL, CKMB, CKMBINDEX, TROPONINI in the last 168 hours. BNP (last 3 results) No results for input(s): PROBNP in the last 8760 hours. HbA1C: No results for input(s): HGBA1C in the last 72 hours. CBG: No results for input(s): GLUCAP in the last 168 hours. Lipid Profile: No results for input(s): CHOL, HDL, LDLCALC, TRIG, CHOLHDL, LDLDIRECT in the last 72 hours. Thyroid Function Tests: No results for input(s): TSH, T4TOTAL, FREET4, T3FREE, THYROIDAB in the last 72 hours. Anemia Panel: No results for input(s): VITAMINB12, FOLATE, FERRITIN, TIBC, IRON, RETICCTPCT in the last 72 hours. Sepsis Labs: No results for input(s): PROCALCITON, LATICACIDVEN in the last 168 hours.  Recent Results (from the past 240 hour(s))  Blood culture (routine x 2)     Status: None (Preliminary result)   Collection Time: 05/24/17  9:38 PM  Result Value Ref Range Status   Specimen Description BLOOD RIGHT FOREARM  Final   Special Requests   Final    BOTTLES DRAWN AEROBIC AND ANAEROBIC Blood  Culture results may not be optimal due to an excessive volume of blood received in culture bottles   Culture NO GROWTH < 12 HOURS  Final   Report Status PENDING  Incomplete  Blood culture (routine x 2)     Status: None (Preliminary result)   Collection Time: 05/24/17 10:05 PM  Result Value Ref Range Status   Specimen Description BLOOD RIGHT HAND  Final   Special Requests   Final    BOTTLES DRAWN AEROBIC AND ANAEROBIC Blood Culture adequate volume   Culture NO GROWTH < 12 HOURS  Final   Report Status PENDING  Incomplete     Radiology Studies: Dg Chest 2 View  Result Date: 05/24/2017 CLINICAL DATA:  Dizziness,  fever, nausea and vomiting since last evening. Patient is on dialysis. EXAM: CHEST  2 VIEW COMPARISON:  None. FINDINGS: The heart size and mediastinal contours are within normal limits. Both lungs are clear with minimal increase in interstitial edema. Right IJ dialysis catheter tip terminating at the cavoatrial junction is again seen with left axillary vascular stent again noted. The visualized skeletal structures are unremarkable. IMPRESSION: Mild interstitial edema of the lungs.  Otherwise negative exam. Electronically Signed   By: Ashley Royalty M.D.   On: 05/24/2017 21:17    Scheduled Meds: . aspirin EC  81 mg Oral Daily  . atorvastatin  40 mg Oral Daily  . carvedilol  25 mg Oral BID WC  . [START ON 05/26/2017] doxercalciferol  2 mcg Intravenous Q M,W,F-HD  . heparin  5,000 Units Subcutaneous Q8H  . hydrALAZINE  100 mg Oral TID  . hydrocortisone sod succinate (SOLU-CORTEF) inj  50 mg Intravenous Q8H  . losartan  100 mg Oral Daily  . pantoprazole  40 mg Oral Daily   Continuous Infusions: . [START ON 05/26/2017] ceFEPime (MAXIPIME) IV    . [START ON 05/26/2017] vancomycin       LOS: 0 days   CHIU, Orpah Melter, MD [facility] Pager [phone]  If 7PM-7AM, please contact night-coverage [email or web address] Password [facility] 05/25/2017, 2:55 PM

## 2017-05-25 NOTE — ED Notes (Signed)
Patient transported on Tele by this RN to [place].  Nurse Tech and RN in room to receive patient.  No distress noted throughout transport, patient ambulatory to the room

## 2017-05-25 NOTE — Progress Notes (Signed)
Patient continues to refuse MRSA swab.  Will continue to monitor.  Earleen Reaper RN-BC, [employer]

## 2017-05-25 NOTE — H&P (Signed)
History and Physical    Marvin Roberson EYC:[id number] DOB: 1971/10/14 DOA: 05/24/2017  PCP: Donato Heinz, MD  Patient coming from: Home.  Chief Complaint: Generalized body ache.  HPI: Marvin Roberson is a 46 y.o. male with history of ESRD on hemodialysis, hypertension, adrenal insufficiency and history of infected mycotic aneurysm presents to the ER with complaints of generalized body ache. Patient states he is out of his hydrocortisone tablets last 4 days. And over the last 2 days he has been having generalized body ache with nausea vomiting and subjective feeling of fever and chills. Denies any chest pain shortness of breath cough productive sputum or diarrhea.   ED Course: In the ER patient was found to be febrile. Blood cultures were obtained. Chest x-ray was unremarkable. Patient is being admitted for further management. After admission patient was complaining of increasing pain in the left arm which is being followed by vascular surgery and was originally referred to [facility] where he had his first surgery.  Review of Systems: As per HPI, rest all negative.   Past Medical History:  Diagnosis Date  . Adrenal insufficiency ([place])   . Dyspnea    "when I have too much fluid"  . ESRD on dialysis [facility]) since 1990s   "TTS; [address]" (11/27/2016)  . GERD (gastroesophageal reflux disease)   . High cholesterol   . History of blood transfusion   . Hypertension   . Kidney failure   . Renal insufficiency   . Stroke [facility] 2016   decreased vision in his left eye/notes 11/27/2016    Past Surgical History:  Procedure Laterality Date  . ANGIOPLASTY Left 12/11/2016   Procedure: ANGIOPLASTY LEFT ARM & SUBCLAVIAN ARTERY;  Surgeon: Conrad [place], MD;  Location: [place];  Service: Vascular;  Laterality: Left;  . arm surgery Left 2016   "for aneurysm"  . AV FISTULA PLACEMENT    . FALSE ANEURYSM REPAIR Left 12/11/2016   Procedure: RESECTION  BRACHIAL ARTERY;  Surgeon: Conrad Spencer, MD;   Location: [place];  Service: Vascular;  Laterality: Left;  . INSERTION OF DIALYSIS CATHETER Right    chest  . THROMBECTOMY BRACHIAL ARTERY  12/11/2016   Procedure: THROMBECTOMY BRACHIAL ARTERY AND ULNAR;  Surgeon: Conrad Kingsland, MD;  Location: [place];  Service: Vascular;;  . THYROIDECTOMY    . UPPER EXTREMITY ANGIOGRAM Left 12/11/2016   Procedure: LEFT ARM ANGIOGRAM;  Surgeon: Conrad Brantley, MD;  Location: [place];  Service: Vascular;  Laterality: Left;  . WOUND EXPLORATION Left 12/11/2016   Procedure: LEFT ARM BRACHIAL ARTERY WITH INTERPOSTIONAL GRAFT;  Surgeon: Conrad [place], MD;  Location: [place];  Service: Vascular;  Laterality: Left;     reports that he has been smoking Cigarettes.  He has a 2.40 pack-year smoking history. He has never used smokeless tobacco. He reports that he does not drink alcohol or use drugs.  Allergies  Allergen Reactions  . Codeine Nausea And Vomiting  . Gabapentin Other (See Comments)    Sleepiness, altered mental state  . Lisinopril Other (See Comments) and Swelling    angioedema  . Povidone-Iodine Itching  . Betadine [Povidone Iodine] Rash    Family History  Problem Relation Age of Onset  . Hypertension Other     Prior to Admission medications   Medication Sig Start Date End Date Taking? Authorizing Provider  aspirin EC 81 MG tablet Take 81 mg by mouth daily.   Yes [provider]  atorvastatin (LIPITOR) 40  MG tablet Take 40 mg by mouth daily.   Yes [provider]  carvedilol (COREG) 25 MG tablet Take 25 mg by mouth 2 (two) times daily with a meal.   Yes [provider]  cefTAZidime 2 g in dextrose 5 % 50 mL Inject 2 g into the vein every Tuesday, Thursday, and Saturday at 6 PM. 12/14/16  Yes Trinh, Kimberly A, PA-C  hydrALAZINE (APRESOLINE) 100 MG tablet Take 100 mg by mouth 3 (three) times daily.   Yes [provider]  HYDROcodone-acetaminophen (NORCO/VICODIN) 5-325 MG tablet Take 1-2 tablets by mouth every 6 (six) hours as  needed. Patient taking differently: Take 1-2 tablets by mouth every 6 (six) hours as needed.  03/09/17  Yes Ward, Delice Bison, DO  hydrocortisone (CORTEF) 5 MG tablet Take 3 tablets (15 mg) every morning Patient taking differently: Take 5 mg by mouth 2 (two) times daily. Take 3 tablets (15 mg) every morning 11/29/16  Yes Ledell Noss, MD  losartan (COZAAR) 100 MG tablet Take 100 mg by mouth daily.   Yes [provider]  omeprazole (PRILOSEC) 40 MG capsule Take 40 mg by mouth daily.   Yes [provider]  oxyCODONE-acetaminophen (PERCOCET/ROXICET) 5-325 MG tablet Take 1-2 tablets by mouth every 6 (six) hours as needed for moderate pain. 12/13/16  Yes Alvia Grove, PA-C  Vancomycin (VANCOCIN) 750-5 MG/150ML-% SOLN Inject 150 mLs (750 mg total) into the vein Every Tuesday,Thursday,and Saturday with dialysis. 12/14/16  Yes Alvia Grove, PA-C    Physical Exam: Vitals:   05/24/17 2200 05/24/17 2222 05/24/17 2300 05/24/17 2345  BP: (!) 158/89  (!) 158/98 (!) 157/93  Pulse: 79  80 79  Resp: 16  18   Temp:  (!) 100.6 F (38.1 C)    TempSrc:  Oral    SpO2: 96%  97% 94%  Weight:      Height:          Constitutional: Moderately built and nourished. Vitals:   05/24/17 2200 05/24/17 2222 05/24/17 2300 05/24/17 2345  BP: (!) 158/89  (!) 158/98 (!) 157/93  Pulse: 79  80 79  Resp: 16  18   Temp:  (!) 100.6 F (38.1 C)    TempSrc:  Oral    SpO2: 96%  97% 94%  Weight:      Height:       Eyes: Anicteric no pallor. ENMT: No discharge from the ears eyes nose and mouth. Neck: No mass felt. No neck rigidity. Respiratory: No rhonchi or crepitations. Cardiovascular: S1 and S2 heard no murmurs appreciated. Abdomen: Soft nontender all sounds present. No guarding or rigidity. Musculoskeletal: No edema. No joint effusion. Pulses felt. Skin: No rash. Neurologic: Alert awake oriented to time place and person. Moves all extremities. Psychiatric: Appears normal. Normal  affect.   Labs on Admission: I have personally reviewed following labs and imaging studies  CBC:  Recent Labs Lab 05/24/17 1913  WBC 5.9  HGB 11.1*  HCT 35.1*  MCV 93.6  PLT 097   Basic Metabolic Panel:  Recent Labs Lab 05/24/17 1913  NA 130*  K 5.2*  CL 95*  CO2 23  GLUCOSE 83  BUN 48*  CREATININE 12.04*  CALCIUM 8.4*   GFR: Estimated Creatinine Clearance: 6.9 mL/min (A) (by C-G formula based on SCr of 12.04 mg/dL (H)). Liver Function Tests:  Recent Labs Lab 05/24/17 1913  AST 24  ALT 21  ALKPHOS 72  BILITOT 0.8  PROT 8.2*  ALBUMIN 3.1*  No results for input(s): LIPASE, AMYLASE in the last 168 hours. No results for input(s): AMMONIA in the last 168 hours. Coagulation Profile: No results for input(s): INR, PROTIME in the last 168 hours. Cardiac Enzymes: No results for input(s): CKTOTAL, CKMB, CKMBINDEX, TROPONINI in the last 168 hours. BNP (last 3 results) No results for input(s): PROBNP in the last 8760 hours. HbA1C: No results for input(s): HGBA1C in the last 72 hours. CBG: No results for input(s): GLUCAP in the last 168 hours. Lipid Profile: No results for input(s): CHOL, HDL, LDLCALC, TRIG, CHOLHDL, LDLDIRECT in the last 72 hours. Thyroid Function Tests: No results for input(s): TSH, T4TOTAL, FREET4, T3FREE, THYROIDAB in the last 72 hours. Anemia Panel: No results for input(s): VITAMINB12, FOLATE, FERRITIN, TIBC, IRON, RETICCTPCT in the last 72 hours. Urine analysis:    Component Value Date/Time   COLORURINE STRAW (A) 05/24/2017 2321   APPEARANCEUR CLEAR 05/24/2017 2321   LABSPEC 1.005 05/24/2017 2321   PHURINE 8.0 05/24/2017 2321   GLUCOSEU 50 (A) 05/24/2017 2321   HGBUR NEGATIVE 05/24/2017 2321   BILIRUBINUR NEGATIVE 05/24/2017 2321   KETONESUR NEGATIVE 05/24/2017 2321   PROTEINUR 30 (A) 05/24/2017 2321   NITRITE NEGATIVE 05/24/2017 2321   LEUKOCYTESUR NEGATIVE 05/24/2017 2321   Sepsis  Labs: @LABRCNTIP (procalcitonin:4,lacticidven:4) )No results found for this or any previous visit (from the past 240 hour(s)).   Radiological Exams on Admission: Dg Chest 2 View  Result Date: 05/24/2017 CLINICAL DATA:  Dizziness, fever, nausea and vomiting since last evening. Patient is on dialysis. EXAM: CHEST  2 VIEW COMPARISON:  None. FINDINGS: The heart size and mediastinal contours are within normal limits. Both lungs are clear with minimal increase in interstitial edema. Right IJ dialysis catheter tip terminating at the cavoatrial junction is again seen with left axillary vascular stent again noted. The visualized skeletal structures are unremarkable. IMPRESSION: Mild interstitial edema of the lungs.  Otherwise negative exam. Electronically Signed   By: Ashley Royalty M.D.   On: 05/24/2017 21:17     Assessment/Plan Principal Problem:   Adrenal crisis [facility]) Active Problems:   ESRD on hemodialysis (HCC)   Fever    1. Fever with history of infected mycotic aneurysm - will get blood cultures and place patient on empiric antibiotics vancomycin and cefepime for now. 2. Left forearm pain - discuss with Dr. Bridgett Larsson, vascular surgeon known to him from previous surgery. Dr. Bridgett Larsson will assess the patient and give his recommendations to see if his graft has been infected or not. 3. Adrenal insufficiency - patient has been placed on stress dose steroids as he recently missed his medication and patient is presently probably infected. 4. Hypertension - we'll continue home medications. 5. ESRD on hemodialysis on Monday Wednesday and Friday. Please consult nephrology for dialysis. 6. Anemia probably from ESRD - follow CBC.  I have reviewed patient's old charts and labs. Discussed with Dr. Bridgett Larsson on call vascular surgeon.   DVT prophylaxis: Heparin. Code Status: Full code.  Family Communication: Discussed with patient.  Disposition Plan: To be determined.  Consults called: Vascular surgeon.  Admission  status: Observation.    Rise Patience MD [facility] Pager [phone].  If 7PM-7AM, please contact night-coverage [email or web address] Password [facility]  05/25/2017, 12:21 AM

## 2017-05-25 NOTE — Progress Notes (Signed)
Pharmacy Antibiotic Note  Marvin Roberson is a 46 y.o. male admitted on 05/24/2017 with sepsis.  Pharmacy has been consulted for Vancomycin/Cefepime dosing. Presents to the ED with weakness/dizziness. WBC WNL. Pt has ESRD on HD MWF (confirmed with pt). Confirmed with pt that he is not taking anti-biotics at HD.   Plan: -Vancomycin 1250 mg IV x 1, then given 750 mg IV qHD MWF -Cefepime 2g IV x 1 now, then give 2g IV qMWF-1800 (ensure given after HD)  Trend WBC, temp, HD schedule F/U infectious work-up Drug levels as indicated  Height: 5\' 8"  (172.7 cm) Weight: 138 lb (62.6 kg) IBW/kg (Calculated) : 68.4  Temp (24hrs), Avg:100.5 F (38.1 C), Min:100.2 F (37.9 C), Max:100.8 F (38.2 C)   Recent Labs Lab 05/24/17 1913  WBC 5.9  CREATININE 12.04*    Estimated Creatinine Clearance: 6.9 mL/min (A) (by C-G formula based on SCr of 12.04 mg/dL (H)).    Allergies  Allergen Reactions  . Codeine Nausea And Vomiting  . Gabapentin Other (See Comments)    Sleepiness, altered mental state  . Lisinopril Other (See Comments) and Swelling    angioedema  . Povidone-Iodine Itching  . Betadine [Povidone Iodine] Rash    Narda Bonds 05/25/2017 12:30 AM

## 2017-05-26 DIAGNOSIS — Z7982 Long term (current) use of aspirin: Secondary | ICD-10-CM

## 2017-05-26 DIAGNOSIS — Z888 Allergy status to other drugs, medicaments and biological substances status: Secondary | ICD-10-CM

## 2017-05-26 DIAGNOSIS — Z79899 Other long term (current) drug therapy: Secondary | ICD-10-CM

## 2017-05-26 DIAGNOSIS — F17210 Nicotine dependence, cigarettes, uncomplicated: Secondary | ICD-10-CM

## 2017-05-26 LAB — RENAL FUNCTION PANEL
ALBUMIN: 2.8 g/dL — AB (ref 3.5–5.0)
Anion gap: 14 (ref 5–15)
BUN: 76 mg/dL — AB (ref 6–20)
CHLORIDE: 100 mmol/L — AB (ref 101–111)
CO2: 19 mmol/L — ABNORMAL LOW (ref 22–32)
CREATININE: 14.33 mg/dL — AB (ref 0.61–1.24)
Calcium: 8 mg/dL — ABNORMAL LOW (ref 8.9–10.3)
GFR calc Af Amer: 4 mL/min — ABNORMAL LOW (ref >60)
GFR calc non Af Amer: 4 mL/min — ABNORMAL LOW (ref >60)
GLUCOSE: 130 mg/dL — AB (ref 65–99)
PHOSPHORUS: 6.5 mg/dL — AB (ref 2.5–4.6)
POTASSIUM: 5.1 mmol/L (ref 3.5–5.1)
Sodium: 133 mmol/L — ABNORMAL LOW (ref 135–145)

## 2017-05-26 LAB — CBC
HEMATOCRIT: 30.2 % — AB (ref 39.0–52.0)
Hemoglobin: 9.9 g/dL — ABNORMAL LOW (ref 13.0–17.0)
MCH: 29.5 pg (ref 26.0–34.0)
MCHC: 32.8 g/dL (ref 30.0–36.0)
MCV: 89.9 fL (ref 78.0–100.0)
PLATELETS: 153 10*3/uL (ref 150–400)
RBC: 3.36 MIL/uL — ABNORMAL LOW (ref 4.22–5.81)
RDW: 19.7 % — AB (ref 11.5–15.5)
WBC: 5.2 10*3/uL (ref 4.0–10.5)

## 2017-05-26 MED ORDER — VANCOMYCIN HCL IN DEXTROSE 750-5 MG/150ML-% IV SOLN
INTRAVENOUS | Status: AC
  Administered 2017-05-26: 750 mg
  Filled 2017-05-26: qty 150

## 2017-05-26 MED ORDER — SODIUM CHLORIDE 0.9 % IV SOLN: 100.0000 mL | INTRAVENOUS | Status: DC | PRN

## 2017-05-26 MED ORDER — HYDROCORTISONE 5 MG PO TABS: 5.0000 mg | ORAL_TABLET | Freq: Two times a day (BID) | ORAL | 0 refills | Status: DC

## 2017-05-26 MED ORDER — HEPARIN SODIUM (PORCINE) 1000 UNIT/ML DIALYSIS: 1000.0000 [IU] | INTRAMUSCULAR | Status: DC | PRN

## 2017-05-26 MED ORDER — SEVELAMER CARBONATE 800 MG PO TABS
1600.0000 mg | ORAL_TABLET | Freq: Three times a day (TID) | ORAL | Status: DC
Start: 2017-05-26 — End: 2017-05-26

## 2017-05-26 MED ORDER — LACTULOSE 20 G PO PACK: 20.0000 g | PACK | Freq: Every day | ORAL | 0 refills | Status: DC | PRN

## 2017-05-26 MED ORDER — DOXERCALCIFEROL 4 MCG/2ML IV SOLN
INTRAVENOUS | Status: AC
  Filled 2017-05-26: qty 2

## 2017-05-26 MED ORDER — LACTULOSE 10 GM/15ML PO SOLN
10.0000 g | ORAL | Status: AC
  Administered 2017-05-26: 10 g via ORAL
  Filled 2017-05-26: qty 15

## 2017-05-26 MED ORDER — HEPARIN SODIUM (PORCINE) 1000 UNIT/ML DIALYSIS: 2000.0000 [IU] | Freq: Once | INTRAMUSCULAR | Status: DC

## 2017-05-26 MED ORDER — ALTEPLASE 2 MG IJ SOLR: 2.0000 mg | Freq: Once | INTRAMUSCULAR | Status: DC | PRN

## 2017-05-26 NOTE — Consult Note (Signed)
[place] for Infectious Disease       Reason for Consult: fever    Referring Physician: Dr. Wyline Copas  Principal Problem:   Fever Active Problems:   Adrenal crisis ([place])   ESRD on hemodialysis ([place])   . aspirin EC  81 mg Oral Daily  . atorvastatin  40 mg Oral Daily  . carvedilol  25 mg Oral BID WC  . doxercalciferol  2 mcg Intravenous Q M,W,F-HD  . heparin  5,000 Units Subcutaneous Q8H  . hydrALAZINE  100 mg Oral TID  . hydrocortisone sod succinate (SOLU-CORTEF) inj  50 mg Intravenous Q8H  . losartan  100 mg Oral Daily  . pantoprazole  40 mg Oral Daily  . sevelamer carbonate  1,600 mg Oral TID WC    Recommendations: No antibiotic treatment indicated    Assessment: He came in with fever and a history of an infected mycotic aneurysm in the setting of body aches, malaise. Tmax 100.6.  I agree with vascular surgery that the graft does not look infected and I most suspect a viral syndrome with low grade fever, malaise.  No treatment indicated.   Recent HIV negative  Antibiotics: Vancomycin day 3 Cefepime 1 day  HPI: Marvin Roberson is a 46 y.o. male with ESRD on hemodialysis who came in with above complaints.  Blood cultures sent and ngtd.  He has had increasing pain in his left arm fistula over the last month and there was concern of infection.  Vascular surgery saw him and recommended he return to [facility] for further surgical issues.  He currently is having no n/v/d.  No fever or chills.  Normal po.    Review of Systems:  Respiratory: negative for cough or sputum Gastrointestinal: negative for diarrhea Neurological: negative for headaches All other systems reviewed and are negative    Past Medical History:  Diagnosis Date  . Adrenal insufficiency ([place])   . Dyspnea    "when I have too much fluid"  . ESRD on dialysis [facility]) since 1990s   "TTS; [address]" (11/27/2016)  . GERD (gastroesophageal reflux disease)   . High cholesterol   . History of blood  transfusion   . Hypertension   . Kidney failure   . Renal insufficiency   . Stroke [facility]) 2016   decreased vision in his left eye/notes 11/27/2016    Social History  Substance Use Topics  . Smoking status: Current Every Day Smoker    Packs/day: 0.12    Years: 20.00    Types: Cigarettes  . Smokeless tobacco: Never Used     Comment: Less than 1/2 pk per day  . Alcohol use No    Family History  Problem Relation Age of Onset  . Hypertension Other     Allergies  Allergen Reactions  . Codeine Nausea And Vomiting  . Gabapentin Other (See Comments)    Sleepiness, altered mental state  . Lisinopril Other (See Comments) and Swelling    angioedema  . Povidone-Iodine Itching  . Betadine [Povidone Iodine] Rash    Physical Exam: Constitutional: in no apparent distress and alert  Vitals:   05/26/17 0900 05/26/17 0930  BP: (!) 145/78 (!) 145/80  Pulse: 67 79  Resp:    Temp:     EYES: anicteric ENMT: no thrush Cardiovascular: Cor RRR Respiratory: CTA B; normal respiratory effort GI: Bowel sounds are normal, soft, nt Musculoskeletal: no pedal edema noted Skin: fistula site on left arm with no warmth, no tenderness, no erythema  Lab Results  Component Value Date   WBC 5.2 05/26/2017   HGB 9.9 (L) 05/26/2017   HCT 30.2 (L) 05/26/2017   MCV 89.9 05/26/2017   PLT 153 05/26/2017    Lab Results  Component Value Date   CREATININE 14.33 (H) 05/26/2017   BUN 76 (H) 05/26/2017   NA 133 (L) 05/26/2017   K 5.1 05/26/2017   CL 100 (L) 05/26/2017   CO2 19 (L) 05/26/2017    Lab Results  Component Value Date   ALT 21 05/24/2017   AST 24 05/24/2017   ALKPHOS 72 05/24/2017     Microbiology: Recent Results (from the past 240 hour(s))  Blood culture (routine x 2)     Status: None (Preliminary result)   Collection Time: 05/24/17  9:38 PM  Result Value Ref Range Status   Specimen Description BLOOD RIGHT FOREARM  Final   Special Requests   Final    BOTTLES DRAWN AEROBIC AND  ANAEROBIC Blood Culture results may not be optimal due to an excessive volume of blood received in culture bottles   Culture NO GROWTH < 12 HOURS  Final   Report Status PENDING  Incomplete  Blood culture (routine x 2)     Status: None (Preliminary result)   Collection Time: 05/24/17 10:05 PM  Result Value Ref Range Status   Specimen Description BLOOD RIGHT HAND  Final   Special Requests   Final    BOTTLES DRAWN AEROBIC AND ANAEROBIC Blood Culture adequate volume   Culture NO GROWTH < 12 HOURS  Final   Report Status PENDING  Incomplete    Scharlene Gloss, [place] for Infectious Disease [facility] [email or web address] [phone] pager  [phone] cell 05/26/2017, 1:01 PM

## 2017-05-26 NOTE — Progress Notes (Signed)
IV discontinued,catheter intact. Discharge instructions given on medications,and follow up visits, patient verbalized understanding. Prescription sent patient. Accompanied by staff to an awaiting vehicle.

## 2017-05-26 NOTE — Discharge Summary (Signed)
Physician Discharge Summary  Marvin Roberson QIO:[id number] DOB: Apr 02, 1971 DOA: 05/24/2017  PCP: Donato Heinz, MD  Admit date: 05/24/2017 Discharge date: 05/26/2017  Admitted From: Home Disposition:  Home  Recommendations for Outpatient Follow-up:  1. Follow up with PCP as scheduled 2. Follow up with [facility] V[facility] Surgery as scheduled 3. Please continue to wean steroids slowly - patient reports being on hydrocortisone since 1/18  Discharge Condition:Stable CODE STATUS:Full Diet recommendation: Renal   Brief/Interim Summary: 46 y.o.malewith history of ESRD on hemodialysis, hypertension, adrenal insufficiency and history of infected mycotic aneurysm presents to the ER with complaints of generalized body ache. Patient states he is out of his hydrocortisone tablets last 4 days. And over the last 2 days he has been having generalized body ache with nausea vomiting and subjective feeling of fever and chills. Denies any chest pain shortness of breath cough productive sputum or diarrhea.  ED Course:In the ER patient was found to be febrile. Blood cultures were obtained. Chest x-ray was unremarkable. Patient is being admitted for further management. After admission patient was complaining of increasing pain in the left arm which is being followed by vascular surgery and was originally referred to [facility] where he had his first surgery.   1. Fever with history of infected mycotic aneurysm 1. Blood cultures were negative x2 2. Patient was continued on empiric vancomycin and cefepime. 3. Patient's fevers resolved 4. Consulted ID. Recommendations for no further antibiotics 5. CT L arm ordered and reviewed. Chronic changes noted 2. Left forearm pain 1. Dr. Bridgett Larsson consulted. Appreciate input 2. CT arm ordered with findings of chronic changes 3. Patient to follow up with [facility] Surgery 3. Adrenal insufficiency  1. Pt continued on stress-dosed steroids 2. Patient's symptoms worsened  shortly after running out of stress dosed steroids at home. Suspect etiology of much of patient's presenting symptoms 3. Patient reports being on high dosed steroids since 1/18. Would recommend SLOW taper to off. Will prescribe 2 weeks supply of hydrocortisone, defer to PCP to continue to wean steroids as tolerated 4. Hypertension 1. Stable at present 2. Cont home medications 5. ESRD on hemodialysis on Monday Wednesday and Friday. 1. Nephrology consulted 2. Patient underwent routine HD on 7/23 6. Anemia probably from ESRD 1. Hemodynamically stable at this time 2. Hgb 11.1  Discharge Diagnoses:  Principal Problem:   Fever Active Problems:   Adrenal crisis ([place])   ESRD on hemodialysis [facility])    Discharge Instructions   Allergies as of 05/26/2017      Reactions   Codeine Nausea And Vomiting   Gabapentin Other (See Comments)   Sleepiness, altered mental state   Lisinopril Other (See Comments), Swelling   angioedema   Povidone-iodine Itching   Betadine [povidone Iodine] Rash      Medication List    TAKE these medications   aspirin EC 81 MG tablet Take 81 mg by mouth daily.   atorvastatin 40 MG tablet Commonly known as:  LIPITOR Take 40 mg by mouth daily.   carvedilol 25 MG tablet Commonly known as:  COREG Take 25 mg by mouth 2 (two) times daily with a meal.   hydrALAZINE 100 MG tablet Commonly known as:  APRESOLINE Take 100 mg by mouth 3 (three) times daily.   HYDROcodone-acetaminophen 5-325 MG tablet Commonly known as:  NORCO/VICODIN Take 1-2 tablets by mouth every 6 (six) hours as needed.   hydrocortisone 5 MG tablet Commonly known as:  CORTEF Take 1 tablet (5 mg total) by mouth 2 (  two) times daily. Take 3 tablets (15 mg) every morning What changed:  how much to take  how to take this  when to take this   lactulose 20 g packet Commonly known as:  CEPHULAC Take 1 packet (20 g total) by mouth daily as needed (constipation).   losartan 100 MG  tablet Commonly known as:  COZAAR Take 100 mg by mouth daily.   omeprazole 40 MG capsule Commonly known as:  PRILOSEC Take 40 mg by mouth daily.   oxyCODONE-acetaminophen 5-325 MG tablet Commonly known as:  PERCOCET/ROXICET Take 1-2 tablets by mouth every 6 (six) hours as needed for moderate pain.      Follow-up [facility].   Why:  Appointment: Tuesday, May 27, 2017 at 9:30am, please call if you are unable to keep this appointment.  Contact information: [address] [place] [address] [phone]       Follow up with [facility] Vascular Surgery as scheduled Follow up.          Allergies  Allergen Reactions  . Codeine Nausea And Vomiting  . Gabapentin Other (See Comments)    Sleepiness, altered mental state  . Lisinopril Other (See Comments) and Swelling    angioedema  . Povidone-Iodine Itching  . Betadine [Povidone Iodine] Rash    Consultations: ID  Procedures/Studies: Dg Chest 2 View  Result Date: 05/24/2017 CLINICAL DATA:  Dizziness, fever, nausea and vomiting since last evening. Patient is on dialysis. EXAM: CHEST  2 VIEW COMPARISON:  None. FINDINGS: The heart size and mediastinal contours are within normal limits. Both lungs are clear with minimal increase in interstitial edema. Right IJ dialysis catheter tip terminating at the cavoatrial junction is again seen with left axillary vascular stent again noted. The visualized skeletal structures are unremarkable. IMPRESSION: Mild interstitial edema of the lungs.  Otherwise negative exam. Electronically Signed   By: Ashley Royalty M.D.   On: 05/24/2017 21:17   Ct Humerus Left Wo Contrast  Result Date: 05/25/2017 CLINICAL DATA:  Bacteremia, question source of infection in the left arm graft. Pseudoaneurysm. EXAM: CT OF THE UPPER LEFT EXTREMITY WITHOUT CONTRAST TECHNIQUE: Multidetector CT imaging of the upper left extremity was performed according  to the standard protocol. The patient refused IV contrast. COMPARISON:  12/17/2016 FINDINGS: The patient refused IV contrast. Bones/Joint/Cartilage Unremarkable Ligaments Suboptimally assessed by CT. Muscles and Tendons Grossly unremarkable Soft tissues The patient refused IV contrast. Accordingly, assessment for patency of the vascular structures in graft is compromised. 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 On the prior exam, proximal to this stent, a graft was noted attaching from the subclavian artery and extending down the left arm. This has a similar appearance on today' s exam. There continues to be some fluid density along the distal upper arm portion of this graft in tracking along the adjacent superficial fascia margin, not unlike the prior exam. The antecubital indistinctness of tissue planes shown on the prior exam persists. On the prior exam, the graft. To anastomose to native brachial artery just proximal to the antecubital region. We also see part of the chronically occluded forearm part of the graft. The prior forearm extensive subcutaneous edema shown on the previous exam is no longer present. There is previously some hematoma and high density material along the native thrombosed axillary artery, this looks similar today. Left axillary enlarged lymph nodes are present including a  1.2 cm node on image 65/4, formerly the same size. No gas is observed tracking in the soft tissues in overall the there is no large difference compared to the prior exam appreciated. Left anterior descending coronary artery atherosclerosis. Multiple left renal cysts are observed as on the prior exam. Hepatic cirrhosis. IMPRESSION: 1. Generally similar postoperative findings with a chronically thrombosed and the chronically stented left axillary artery, a bypass graft from the left subclavian artery extending to the left brachial artery, and some surrounding soft  tissue swelling and edema which is not appreciably changed from 12/17/2016. Please note that the patient refused IV contrast and as result we are unable to assess graft patency and sensitivity for infection is reduced. I do not see any gas in the soft tissues and the areas of indistinct tissue planes and edema appear similar to how they did in February, 2018. 2. Mild left axillary adenopathy, stable from February 2018. 3. Coronary atherosclerosis. 4. We scan partly down into the forearm. The subcutaneous edema in the forearm shown on the prior exam is no longer present. Collapsed left forearm graft noted, similar to prior. This was not previously patent on the exam from 12/17/2016. 5. Hepatic cirrhosis.  Multiple left renal cysts, similar to prior. Electronically Signed   By: Van Clines M.D.   On: 05/25/2017 17:17    Subjective: Eager to go home  Discharge Exam: Vitals:   05/26/17 0900 05/26/17 0930  BP: (!) 145/78 (!) 145/80  Pulse: 67 79  Resp:    Temp:     Vitals:   05/26/17 0800 05/26/17 0830 05/26/17 0900 05/26/17 0930  BP: (!) 167/89 (!) 150/78 (!) 145/78 (!) 145/80  Pulse: 67 66 67 79  Resp:      Temp:      TempSrc:      SpO2:      Weight:      Height:        General: Pt is alert, awake, not in acute distress Cardiovascular: RRR, S1/S2 +, no rubs, no gallops Respiratory: CTA bilaterally, no wheezing, no rhonchi Abdominal: Soft, NT, ND, bowel sounds + Extremities: no edema, no cyanosis   The results of significant diagnostics from this hospitalization (including imaging, microbiology, ancillary and laboratory) are listed below for reference.     Microbiology: Recent Results (from the past 240 hour(s))  Blood culture (routine x 2)     Status: None (Preliminary result)   Collection Time: 05/24/17  9:38 PM  Result Value Ref Range Status   Specimen Description BLOOD RIGHT FOREARM  Final   Special Requests   Final    BOTTLES DRAWN AEROBIC AND ANAEROBIC Blood  Culture results may not be optimal due to an excessive volume of blood received in culture bottles   Culture NO GROWTH < 12 HOURS  Final   Report Status PENDING  Incomplete  Blood culture (routine x 2)     Status: None (Preliminary result)   Collection Time: 05/24/17 10:05 PM  Result Value Ref Range Status   Specimen Description BLOOD RIGHT HAND  Final   Special Requests   Final    BOTTLES DRAWN AEROBIC AND ANAEROBIC Blood Culture adequate volume   Culture NO GROWTH < 12 HOURS  Final   Report Status PENDING  Incomplete     Labs: BNP (last 3 results) No results for input(s): BNP in the last 8760 hours. Basic Metabolic Panel:  Recent Labs Lab 05/24/17 1913 05/26/17 0718  NA 130* 133*  K 5.2* 5.1  CL 95* 100*  CO2 23 19*  GLUCOSE 83 130*  BUN 48* 76*  CREATININE 12.04* 14.33*  CALCIUM 8.4* 8.0*  PHOS  --  6.5*   Liver Function Tests:  Recent Labs Lab 05/24/17 1913 05/26/17 0718  AST 24  --   ALT 21  --   ALKPHOS 72  --   BILITOT 0.8  --   PROT 8.2*  --   ALBUMIN 3.1* 2.8*   No results for input(s): LIPASE, AMYLASE in the last 168 hours. No results for input(s): AMMONIA in the last 168 hours. CBC:  Recent Labs Lab 05/24/17 1913 05/26/17 0719  WBC 5.9 5.2  HGB 11.1* 9.9*  HCT 35.1* 30.2*  MCV 93.6 89.9  PLT 166 153   Cardiac Enzymes: No results for input(s): CKTOTAL, CKMB, CKMBINDEX, TROPONINI in the last 168 hours. BNP: Invalid input(s): POCBNP CBG: No results for input(s): GLUCAP in the last 168 hours. D-Dimer No results for input(s): DDIMER in the last 72 hours. Hgb A1c No results for input(s): HGBA1C in the last 72 hours. Lipid Profile No results for input(s): CHOL, HDL, LDLCALC, TRIG, CHOLHDL, LDLDIRECT in the last 72 hours. Thyroid function studies No results for input(s): TSH, T4TOTAL, T3FREE, THYROIDAB in the last 72 hours.  Invalid input(s): FREET3 Anemia work up No results for input(s): VITAMINB12, FOLATE, FERRITIN, TIBC, IRON,  RETICCTPCT in the last 72 hours. Urinalysis    Component Value Date/Time   COLORURINE STRAW (A) 05/24/2017 2321   APPEARANCEUR CLEAR 05/24/2017 2321   LABSPEC 1.005 05/24/2017 2321   PHURINE 8.0 05/24/2017 2321   GLUCOSEU 50 (A) 05/24/2017 2321   HGBUR NEGATIVE 05/24/2017 2321   BILIRUBINUR NEGATIVE 05/24/2017 2321   KETONESUR NEGATIVE 05/24/2017 2321   PROTEINUR 30 (A) 05/24/2017 2321   NITRITE NEGATIVE 05/24/2017 2321   LEUKOCYTESUR NEGATIVE 05/24/2017 2321   Sepsis Labs Invalid input(s): PROCALCITONIN,  WBC,  LACTICIDVEN Microbiology Recent Results (from the past 240 hour(s))  Blood culture (routine x 2)     Status: None (Preliminary result)   Collection Time: 05/24/17  9:38 PM  Result Value Ref Range Status   Specimen Description BLOOD RIGHT FOREARM  Final   Special Requests   Final    BOTTLES DRAWN AEROBIC AND ANAEROBIC Blood Culture results may not be optimal due to an excessive volume of blood received in culture bottles   Culture NO GROWTH < 12 HOURS  Final   Report Status PENDING  Incomplete  Blood culture (routine x 2)     Status: None (Preliminary result)   Collection Time: 05/24/17 10:05 PM  Result Value Ref Range Status   Specimen Description BLOOD RIGHT HAND  Final   Special Requests   Final    BOTTLES DRAWN AEROBIC AND ANAEROBIC Blood Culture adequate volume   Culture NO GROWTH < 12 HOURS  Final   Report Status PENDING  Incomplete     SIGNED:   CHIU, Orpah Melter, MD  [facility] 05/26/2017, 1:40 PM  If 7PM-7AM, please contact night-coverage [email or web address] Password [email or web address]

## 2017-05-26 NOTE — Progress Notes (Signed)
[facility] Progress Note  Subjective:  Seen on HD. Upset about time s/o with 16 mins left. Net UF 3L   Objective Vitals:   05/26/17 0800 05/26/17 0830 05/26/17 0900 05/26/17 0930  BP: (!) 167/89 (!) 150/78 (!) 145/78 (!) 145/80  Pulse: 67 66 67 79  Resp:      Temp:      TempSrc:      SpO2:      Weight:      Height:       Physical Exam General: WNWD AAM NAD Heart: RRR Lungs: CTAB Faint crackles L base  Abdomen: soft NT Extremities: No LE edema  Dialysis Access: R IJ TDC in use L arm with scars from prior AVF No erythema/warmth    Additional Objective Labs: Basic Metabolic Panel:  Recent Labs Lab 05/24/17 1913 05/26/17 0718  NA 130* 133*  K 5.2* 5.1  CL 95* 100*  CO2 23 19*  GLUCOSE 83 130*  BUN 48* 76*  CREATININE 12.04* 14.33*  CALCIUM 8.4* 8.0*  PHOS  --  6.5*   Liver Function Tests:  Recent Labs Lab 05/24/17 1913 05/26/17 0718  AST 24  --   ALT 21  --   ALKPHOS 72  --   BILITOT 0.8  --   PROT 8.2*  --   ALBUMIN 3.1* 2.8*   No results for input(s): LIPASE, AMYLASE in the last 168 hours. CBC:  Recent Labs Lab 05/24/17 1913 05/26/17 0719  WBC 5.9 5.2  HGB 11.1* 9.9*  HCT 35.1* 30.2*  MCV 93.6 89.9  PLT 166 153   Blood Culture    Component Value Date/Time   SDES BLOOD RIGHT HAND 05/24/2017 2205   SPECREQUEST  05/24/2017 2205    BOTTLES DRAWN AEROBIC AND ANAEROBIC Blood Culture adequate volume   CULT NO GROWTH < 12 HOURS 05/24/2017 2205   REPTSTATUS PENDING 05/24/2017 2205    Cardiac Enzymes: No results for input(s): CKTOTAL, CKMB, CKMBINDEX, TROPONINI in the last 168 hours. CBG: No results for input(s): GLUCAP in the last 168 hours. Iron Studies: No results for input(s): IRON, TIBC, TRANSFERRIN, FERRITIN in the last 72 hours. Lab Results  Component Value Date   INR 1.22 11/27/2016   Medications: . vancomycin     . aspirin EC  81 mg Oral Daily  . atorvastatin  40 mg Oral Daily  . carvedilol  25 mg Oral BID WC   . doxercalciferol  2 mcg Intravenous Q M,W,F-HD  . heparin  5,000 Units Subcutaneous Q8H  . hydrALAZINE  100 mg Oral TID  . hydrocortisone sod succinate (SOLU-CORTEF) inj  50 mg Intravenous Q8H  . losartan  100 mg Oral Daily  . pantoprazole  40 mg Oral Daily   Dialysis Orders:   MWF at [facility] 3:30hr, BFR 350/DFR800, 2K/2.5Ca, EDW 61.5kg, TDC, heparin 2000 bolus + 2000 mid-run bolus - Mircera 191mcg IV q 2 weeks (last given 7/16) - Hectoral 60mcg IV q HD  Assessment/Plan: 1.  Fever/myalgias with history of mycotic aneurysm BCx 7/21 NGTD. Empirically started on Vanc/Cefepime. No acute changes on L arm CT 7/22 2.  ESRD:  Will continue usual MWF schedule, will try to increase UF with next HD to target interstitial edema seen on CXR. 3.  Hypertension/volume: BP elevated with volume on CXR Continue home meds for now. Net UF 3L with HD today  4.  Anemia:Hgb 11.1. Not due for ESA dosing yet. 5.  Metabolic bone disease: Ca ok. Continue Hectoral. Per OP  notes, ?not on binder. P 6.5 Add Renvela 6.  Nutrition:  Alb low, start Nepro. 7.  Adrenal insufficiency: On stress dose steroids currently.  Lynnda Child PA-C [facility] Pager [phone] 05/26/2017,11:19 AM  LOS: 1 day   Pt seen, examined and agree w A/P as above.  Kelly Splinter MD [employer] pager [phone]   05/26/2017, 12:31 PM

## 2017-05-26 NOTE — Care Management Note (Signed)
Case Management Note  Patient Details  Name: Marvin Roberson MRN: [id number] Date of Birth: Feb 08, 1971  Subjective/Objective:       CM following for progression and d/c planning.              Action/Plan: 05/26/2017 Noted plan to d/c this pt today, pt has no PCP. Have scheduled this pt for a follow up at [facility] as he need immediate follow up for medication adjustment.  Appointment is scheduled for Tuesday , May 27, 2017 at 9:30am.  Expected Discharge Date:  05/27/17               Expected Discharge Plan:  Home/Self Care  In-House Referral:  NA  Discharge planning Services  CM Consult, Other - See comment (PCP followup as pt has no PCP to manage medicationl)  Post Acute Care Choice:  NA Choice offered to:  NA  DME Arranged:  N/A DME Agency:  NA  HH Arranged:  NA HH Agency:  NA  Status of Service:  Completed, signed off  If discussed at [place] of Stay Meetings, dates discussed:    Additional Comments:  Adron Bene, RN 05/26/2017, 12:03 PM

## 2017-05-27 ENCOUNTER — Ambulatory Visit: Payer: Medicare Other | Attending: Internal Medicine | Admitting: Physician Assistant

## 2017-05-27 VITALS — BP 162/117 | HR 67 | Temp 98.0°F | Resp 16 | Ht 69.0 in | Wt 140.9 lb

## 2017-05-27 DIAGNOSIS — Z79899 Other long term (current) drug therapy: Secondary | ICD-10-CM | POA: Insufficient documentation

## 2017-05-27 DIAGNOSIS — Z888 Allergy status to other drugs, medicaments and biological substances status: Secondary | ICD-10-CM | POA: Insufficient documentation

## 2017-05-27 DIAGNOSIS — I10 Essential (primary) hypertension: Secondary | ICD-10-CM

## 2017-05-27 DIAGNOSIS — E7800 Pure hypercholesterolemia, unspecified: Secondary | ICD-10-CM | POA: Insufficient documentation

## 2017-05-27 DIAGNOSIS — Z992 Dependence on renal dialysis: Secondary | ICD-10-CM | POA: Insufficient documentation

## 2017-05-27 DIAGNOSIS — I132 Hypertensive heart and chronic kidney disease with heart failure and with stage 5 chronic kidney disease, or end stage renal disease: Secondary | ICD-10-CM | POA: Diagnosis not present

## 2017-05-27 DIAGNOSIS — E2740 Unspecified adrenocortical insufficiency: Secondary | ICD-10-CM | POA: Insufficient documentation

## 2017-05-27 DIAGNOSIS — Z8673 Personal history of transient ischemic attack (TIA), and cerebral infarction without residual deficits: Secondary | ICD-10-CM | POA: Insufficient documentation

## 2017-05-27 DIAGNOSIS — Z7982 Long term (current) use of aspirin: Secondary | ICD-10-CM | POA: Diagnosis not present

## 2017-05-27 DIAGNOSIS — F17210 Nicotine dependence, cigarettes, uncomplicated: Secondary | ICD-10-CM | POA: Insufficient documentation

## 2017-05-27 DIAGNOSIS — G8929 Other chronic pain: Secondary | ICD-10-CM | POA: Insufficient documentation

## 2017-05-27 DIAGNOSIS — E271 Primary adrenocortical insufficiency: Secondary | ICD-10-CM | POA: Diagnosis not present

## 2017-05-27 DIAGNOSIS — M79602 Pain in left arm: Secondary | ICD-10-CM | POA: Diagnosis not present

## 2017-05-27 DIAGNOSIS — N186 End stage renal disease: Secondary | ICD-10-CM | POA: Diagnosis not present

## 2017-05-27 DIAGNOSIS — K219 Gastro-esophageal reflux disease without esophagitis: Secondary | ICD-10-CM | POA: Insufficient documentation

## 2017-05-27 DIAGNOSIS — Z885 Allergy status to narcotic agent status: Secondary | ICD-10-CM | POA: Diagnosis not present

## 2017-05-27 NOTE — Patient Instructions (Signed)
Hypertension Hypertension is another name for high blood pressure. High blood pressure forces your heart to work harder to pump blood. This can cause problems over time. There are two numbers in a blood pressure reading. There is a top number (systolic) over a bottom number (diastolic). It is best to have a blood pressure below 120/80. Healthy choices can help lower your blood pressure. You may need medicine to help lower your blood pressure if:  Your blood pressure cannot be lowered with healthy choices.  Your blood pressure is higher than 130/80.  Follow these instructions at home: Eating and drinking  If directed, follow the DASH eating plan. This diet includes: ? Filling half of your plate at each meal with fruits and vegetables. ? Filling one quarter of your plate at each meal with whole grains. Whole grains include whole wheat pasta, brown rice, and whole grain bread. ? Eating or drinking low-fat dairy products, such as skim milk or low-fat yogurt. ? Filling one quarter of your plate at each meal with low-fat (lean) proteins. Low-fat proteins include fish, skinless chicken, eggs, beans, and tofu. ? Avoiding fatty meat, cured and processed meat, or chicken with skin. ? Avoiding premade or processed food.  Eat less than 1,500 mg of salt (sodium) a day.  Limit alcohol use to no more than 1 drink a day for nonpregnant women and 2 drinks a day for men. One drink equals 12 oz of beer, 5 oz of wine, or 1 oz of hard liquor. Lifestyle  Work with your doctor to stay at a healthy weight or to lose weight. Ask your doctor what the best weight is for you.  Get at least 30 minutes of exercise that causes your heart to beat faster (aerobic exercise) most days of the week. This may include walking, swimming, or biking.  Get at least 30 minutes of exercise that strengthens your muscles (resistance exercise) at least 3 days a week. This may include lifting weights or pilates.  Do not use any  products that contain nicotine or tobacco. This includes cigarettes and e-cigarettes. If you need help quitting, ask your doctor.  Check your blood pressure at home as told by your doctor.  Keep all follow-up visits as told by your doctor. This is important. Medicines  Take over-the-counter and prescription medicines only as told by your doctor. Follow directions carefully.  Do not skip doses of blood pressure medicine. The medicine does not work as well if you skip doses. Skipping doses also puts you at risk for problems.  Ask your doctor about side effects or reactions to medicines that you should watch for. Contact a doctor if:  You think you are having a reaction to the medicine you are taking.  You have headaches that keep coming back (recurring).  You feel dizzy.  You have swelling in your ankles.  You have trouble with your vision. Get help right away if:  You get a very bad headache.  You start to feel confused.  You feel weak or numb.  You feel faint.  You get very bad pain in your: ? Chest. ? Belly (abdomen).  You throw up (vomit) more than once.  You have trouble breathing. Summary  Hypertension is another name for high blood pressure.  Making healthy choices can help lower blood pressure. If your blood pressure cannot be controlled with healthy choices, you may need to take medicine. This information is not intended to replace advice given to you by your health care   provider. Make sure you discuss any questions you have with your health care provider. Document Released: 04/08/2008 Document Revised: 09/18/2016 Document Reviewed: 09/18/2016 Elsevier Interactive Patient Education  2018 [employer] Risks of Smoking Smoking cigarettes is very bad for your health. Tobacco smoke has over 200 known poisons in it. It contains the poisonous gases nitrogen oxide and carbon monoxide. There are over 60 chemicals in tobacco smoke that cause cancer. Smoking is  difficult to quit because a chemical in tobacco, called nicotine, causes addiction or dependence. When you smoke and inhale, nicotine is absorbed rapidly into the bloodstream through your lungs. Both inhaled and non-inhaled nicotine may be addictive. What are the risks of cigarette smoke? Cigarette smokers have an increased risk of many serious medical problems, including:  Lung cancer.  Lung disease, such as pneumonia, bronchitis, and emphysema.  Chest pain (angina) and heart attack because the heart is not getting enough oxygen.  Heart disease and peripheral blood vessel disease.  High blood pressure (hypertension).  Stroke.  Oral cancer, including cancer of the lip, mouth, or voice box.  Bladder cancer.  Pancreatic cancer.  Cervical cancer.  Pregnancy complications, including premature birth.  Stillbirths and smaller newborn babies, birth defects, and genetic damage to sperm.  Early menopause.  Lower estrogen level for women.  Infertility.  Facial wrinkles.  Blindness.  Increased risk of broken bones (fractures).  Senile dementia.  Stomach ulcers and internal bleeding.  Delayed wound healing and increased risk of complications during surgery.  Even smoking lightly shortens your life expectancy by several years.  Because of secondhand smoke exposure, children of smokers have an increased risk of the following:  Sudden infant death syndrome (SIDS).  Respiratory infections.  Lung cancer.  Heart disease.  Ear infections.  What are the benefits of quitting? There are many health benefits of quitting smoking. Here are some of them:  Within days of quitting smoking, your risk of having a heart attack decreases, your blood flow improves, and your lung capacity improves. Blood pressure, pulse rate, and breathing patterns start returning to normal soon after quitting.  Within months, your lungs may clear up completely.  Quitting for 10 years reduces your  risk of developing lung cancer and heart disease to almost that of a nonsmoker.  People who quit may see an improvement in their overall quality of life.  How do I quit smoking? Smoking is an addiction with both physical and psychological effects, and longtime habits can be hard to change. Your health care provider can recommend:  Programs and community resources, which may include group support, education, or talk therapy.  Prescription medicines to help reduce cravings.  Nicotine replacement products, such as patches, gum, and nasal sprays. Use these products only as directed. Do not replace cigarette smoking with electronic cigarettes, which are commonly called e-cigarettes. The safety of e-cigarettes is not known, and some may contain harmful chemicals.  A combination of two or more of these methods.  Where to find more information:  American Lung Association: www.lung.org  American Cancer Society: www.cancer.org Summary  Smoking cigarettes is very bad for your health. Cigarette smokers have an increased risk of many serious medical problems, including several cancers, heart disease, and stroke.  Smoking is an addiction with both physical and psychological effects, and longtime habits can be hard to change.  By stopping right away, you can greatly reduce the risk of medical problems for you and your family.  To help you quit smoking, your health care provider  can recommend programs, community resources, prescription medicines, and nicotine replacement products such as patches, gum, and nasal sprays. This information is not intended to replace advice given to you by your health care provider. Make sure you discuss any questions you have with your health care provider. Document Released: 11/28/2004 Document Revised: 10/25/2016 Document Reviewed: 10/25/2016 Elsevier Interactive Patient Education  2017 [employer].

## 2017-05-27 NOTE — Progress Notes (Signed)
Marvin Roberson  ONG:[id number]  GMW:[id number]  DOB - 09-01-1971  Chief Complaint  Patient presents with  . Hospitalization Follow-up       Subjective:   Marvin Roberson is a 46 y.o. male here today for establishment of care. He is originally from [place]. He has been in this area for one year. He plans to move back to [place] by the end of the year. He has a history of end-stage renal disease on hemodialysis Monday, Wednesday and Friday, hypertension, adrenal insufficiency, and an infected mycotic aneurysm treated in 2015 at [facility]. He presented to the hospital 05/24/2017 with generalized malaise, body aches and headache. He also had some nausea vomiting and fevers. He had some chills. His left arm chronically hurts but he mentioned this during his interview as well. His temperature was 100.6. He was admitted by the internal medicine team. Internal medicine team discussed his case with vascular surgery in regards to the left arm-no plans for further procedures, infectious disease-no plans for further antibiotics, and nephrology who continued his regular regimen of hemodialysis on Mondays, Wednesdays and Friday. He was continued on steroids and a taper is underway. His main issue seems to be adrenal crises. No complications. Discharged on 05/26/2017. Feels a little better today. He has not gotten his medications filled yet. He did not take his blood pressure medicine this morning. Plans to go to hemodialysis tomorrow. Smokes cigarettes. Chronically has left arm pain. No new complaints.   ROS: GEN: denies fever or chills, denies change in weight Skin: denies lesions or rashes HEENT: denies headache, earache, epistaxis, sore throat, or neck pain LUNGS: denies SHOB, dyspnea, PND, orthopnea CV: denies CP or palpitations ABD: denies abd pain, N or V EXT: denies muscle spasms or swelling; + pain in left upper ext, no weakness NEURO: denies numbness or tingling, denies sz, stroke or  TIA  Problem  Fever (Resolved)  Hypoglycemia (Resolved)  Esrd (End Stage Renal Disease) ([facility]) (Resolved)  Flu-Like Symptoms (Resolved)  Nausea & Vomiting (Resolved)  Diarrhea (Resolved)    ALLERGIES: Allergies  Allergen Reactions  . Codeine Nausea And Vomiting  . Gabapentin Other (See Comments)    Sleepiness, altered mental state  . Lisinopril Other (See Comments) and Swelling    angioedema  . Povidone-Iodine Itching  . Betadine [Povidone Iodine] Rash    PAST MEDICAL HISTORY: Past Medical History:  Diagnosis Date  . Adrenal insufficiency ([place])   . Dyspnea    "when I have too much fluid"  . ESRD on dialysis [facility]) since 1990s   "TTS; [address]" (11/27/2016)  . GERD (gastroesophageal reflux disease)   . High cholesterol   . History of blood transfusion   . Hypertension   . Kidney failure   . Renal insufficiency   . Stroke [facility]) 2016   decreased vision in his left eye/notes 11/27/2016    PAST SURGICAL HISTORY: Past Surgical History:  Procedure Laterality Date  . ANGIOPLASTY Left 12/11/2016   Procedure: ANGIOPLASTY LEFT ARM & SUBCLAVIAN ARTERY;  Surgeon: Conrad [place], MD;  Location: [place];  Service: Vascular;  Laterality: Left;  . arm surgery Left 2016   "for aneurysm"  . AV FISTULA PLACEMENT    . FALSE ANEURYSM REPAIR Left 12/11/2016   Procedure: RESECTION  BRACHIAL ARTERY;  Surgeon: Conrad [place], MD;  Location: [place];  Service: Vascular;  Laterality: Left;  . INSERTION OF DIALYSIS CATHETER Right    chest  . THROMBECTOMY BRACHIAL ARTERY  12/11/2016  Procedure: THROMBECTOMY BRACHIAL ARTERY AND ULNAR;  Surgeon: Conrad [place], MD;  Location: [place];  Service: Vascular;;  . THYROIDECTOMY    . UPPER EXTREMITY ANGIOGRAM Left 12/11/2016   Procedure: LEFT ARM ANGIOGRAM;  Surgeon: Conrad Neibert, MD;  Location: [place];  Service: Vascular;  Laterality: Left;  . WOUND EXPLORATION Left 12/11/2016   Procedure: LEFT ARM BRACHIAL ARTERY WITH INTERPOSTIONAL GRAFT;  Surgeon: Conrad [place],  MD;  Location: [place];  Service: Vascular;  Laterality: Left;    MEDICATIONS AT HOME: Prior to Admission medications   Medication Sig Start Date End Date Taking? Authorizing Provider  aspirin EC 81 MG tablet Take 81 mg by mouth daily.   Yes [provider]  atorvastatin (LIPITOR) 40 MG tablet Take 40 mg by mouth daily.   Yes [provider]  carvedilol (COREG) 25 MG tablet Take 25 mg by mouth 2 (two) times daily with a meal.   Yes [provider]  hydrALAZINE (APRESOLINE) 100 MG tablet Take 100 mg by mouth 3 (three) times daily.   Yes [provider]  HYDROcodone-acetaminophen (NORCO/VICODIN) 5-325 MG tablet Take 1-2 tablets by mouth every 6 (six) hours as needed. Patient taking differently: Take 1-2 tablets by mouth every 6 (six) hours as needed.  03/09/17  Yes Ward, Delice Bison, DO  hydrocortisone (CORTEF) 5 MG tablet Take 1 tablet (5 mg total) by mouth 2 (two) times daily. Take 3 tablets (15 mg) every morning 05/26/17  Yes Donne Hazel, MD  losartan (COZAAR) 100 MG tablet Take 100 mg by mouth daily.   Yes [provider]  omeprazole (PRILOSEC) 40 MG capsule Take 40 mg by mouth daily.   Yes [provider]  lactulose (CEPHULAC) 20 g packet Take 1 packet (20 g total) by mouth daily as needed (constipation). Patient not taking: Reported on 05/27/2017 05/26/17   Donne Hazel, MD    Family History  Problem Relation Age of Onset  . Hypertension Other    Eva, has kids, smokes  Objective:   Vitals:   05/27/17 1002 05/27/17 1009  BP: (!) 175/118 (!) 162/117  Pulse: 64 67  Resp: 16   Temp: 98 F (36.7 C)   TempSrc: Oral   Weight: 140 lb 14.4 oz (63.9 kg)   Height: 5\' 9"  (1.753 m)     Exam General appearance : Awake, alert, not in any distress. Speech Clear. Not toxic looking HEENT: Atraumatic and Normocephalic, pupils equally reactive to light and accomodation Neck: supple, no JVD. No cervical lymphadenopathy.   Chest:Good air entry bilaterally, no added sounds  CVS: S1 S2 regular, no murmurs.  Abdomen: Bowel sounds present, Non tender and not distended with no guarding, rigidity or rebound. Extremities: B/L Lower Ext shows no edema, both legs are warm to touch; deformity of left upper ext->old fistula on left, new fistula on right Neurology: Awake alert, and oriented X 3, CN II-XII intact, Non focal Skin:No Rash Wounds:N/A  Data Review Lab Results  Component Value Date   HGBA1C 4.7 (L) 11/28/2016     Assessment & Plan  1. Adrenal insuff with recent crisis  -steroid taper over next couple of weeks  -1 week follow up  -may need labs at that time 2. HTN  -encouraged compliance with meds   -DASH diet  -no changes today, recheck at next visit 3. ESRD  -cont HD M, W, F 4. Smoker  -not ready to quit   Return in about 1 week (around  06/03/2017).  The patient was given clear instructions to go to ER or return to medical center if symptoms don't improve, worsen or new problems develop. The patient verbalized understanding. The patient was told to call to get lab results if they haven't heard anything in the next week.   Total time spent with patient was 17 min. Greater than 50 % of this visit was spent face to face counseling and coordinating care regarding risk factor modification, compliance importance and encouragement, education related to the dangers of high blood pressure.  This note has been created with Surveyor, quantity. Any transcriptional errors are unintentional.    Zettie Pho, PA-C [facility] and [place], [place]   05/27/2017, 10:42 AM

## 2017-05-28 ENCOUNTER — Telehealth: Payer: Self-pay | Admitting: Vascular Surgery

## 2017-05-28 DIAGNOSIS — N186 End stage renal disease: Secondary | ICD-10-CM | POA: Diagnosis not present

## 2017-05-28 DIAGNOSIS — D631 Anemia in chronic kidney disease: Secondary | ICD-10-CM | POA: Diagnosis not present

## 2017-05-28 DIAGNOSIS — N2581 Secondary hyperparathyroidism of renal origin: Secondary | ICD-10-CM | POA: Diagnosis not present

## 2017-05-28 NOTE — Telephone Encounter (Signed)
Per instructions from Dr.Cain on 05/23/17, I spoke with patient to inform him that I faxed the office notes to [facility] office w/ [facility]. Their office indicated they would contact the patient to schedule an appointment once the office notes were received.  Dr.Keagy's [phone] awt

## 2017-05-29 LAB — CULTURE, BLOOD (ROUTINE X 2)
CULTURE: NO GROWTH
Culture: NO GROWTH
SPECIAL REQUESTS: ADEQUATE

## 2017-05-30 DIAGNOSIS — N2581 Secondary hyperparathyroidism of renal origin: Secondary | ICD-10-CM | POA: Diagnosis not present

## 2017-05-30 DIAGNOSIS — D631 Anemia in chronic kidney disease: Secondary | ICD-10-CM | POA: Diagnosis not present

## 2017-05-30 DIAGNOSIS — N186 End stage renal disease: Secondary | ICD-10-CM | POA: Diagnosis not present

## 2017-06-02 DIAGNOSIS — N186 End stage renal disease: Secondary | ICD-10-CM | POA: Diagnosis not present

## 2017-06-02 DIAGNOSIS — D631 Anemia in chronic kidney disease: Secondary | ICD-10-CM | POA: Diagnosis not present

## 2017-06-02 DIAGNOSIS — N2581 Secondary hyperparathyroidism of renal origin: Secondary | ICD-10-CM | POA: Diagnosis not present

## 2017-06-03 DIAGNOSIS — I120 Hypertensive chronic kidney disease with stage 5 chronic kidney disease or end stage renal disease: Secondary | ICD-10-CM | POA: Diagnosis not present

## 2017-06-03 DIAGNOSIS — Z992 Dependence on renal dialysis: Secondary | ICD-10-CM | POA: Diagnosis not present

## 2017-06-03 DIAGNOSIS — N186 End stage renal disease: Secondary | ICD-10-CM | POA: Diagnosis not present

## 2017-06-04 DIAGNOSIS — D631 Anemia in chronic kidney disease: Secondary | ICD-10-CM | POA: Diagnosis not present

## 2017-06-04 DIAGNOSIS — N2581 Secondary hyperparathyroidism of renal origin: Secondary | ICD-10-CM | POA: Diagnosis not present

## 2017-06-04 DIAGNOSIS — N186 End stage renal disease: Secondary | ICD-10-CM | POA: Diagnosis not present

## 2017-06-05 DIAGNOSIS — N186 End stage renal disease: Secondary | ICD-10-CM | POA: Diagnosis not present

## 2017-06-05 DIAGNOSIS — N2581 Secondary hyperparathyroidism of renal origin: Secondary | ICD-10-CM | POA: Diagnosis not present

## 2017-06-05 DIAGNOSIS — D631 Anemia in chronic kidney disease: Secondary | ICD-10-CM | POA: Diagnosis not present

## 2017-06-09 DIAGNOSIS — D631 Anemia in chronic kidney disease: Secondary | ICD-10-CM | POA: Diagnosis not present

## 2017-06-09 DIAGNOSIS — N186 End stage renal disease: Secondary | ICD-10-CM | POA: Diagnosis not present

## 2017-06-09 DIAGNOSIS — N2581 Secondary hyperparathyroidism of renal origin: Secondary | ICD-10-CM | POA: Diagnosis not present

## 2017-06-10 ENCOUNTER — Telehealth: Payer: Self-pay | Admitting: *Deleted

## 2017-06-10 ENCOUNTER — Ambulatory Visit: Payer: Medicare Other | Attending: Internal Medicine | Admitting: *Deleted

## 2017-06-10 VITALS — BP 155/105 | HR 75 | Resp 20

## 2017-06-10 DIAGNOSIS — I10 Essential (primary) hypertension: Secondary | ICD-10-CM | POA: Diagnosis not present

## 2017-06-10 DIAGNOSIS — Z0130 Encounter for examination of blood pressure without abnormal findings: Secondary | ICD-10-CM

## 2017-06-10 NOTE — Progress Notes (Signed)
Pt arrived to [facility]. Pt alert and oriented and arrives in good spirits. Last OV 05/27/2017 with T.Noel, PA.  Pt denies chest pain, SOB, HA, dizziness, or blurred vision.  Verified medication. Pt states medication was taken this morning.  Blood pressure reading:  155/105

## 2017-06-10 NOTE — Telephone Encounter (Signed)
Pt request medication hydrocortisone (Cortef) 5 mg. He states when he does not have this medication, his condition worsens. He has 2 days left. Explained that he would have to establish care with PCP for medication refill. His apt scheduled for Sept. 5, 2018.  Advised patient to call office on Thursday to make apt on [facility] schedule with PA.

## 2017-06-11 DIAGNOSIS — N186 End stage renal disease: Secondary | ICD-10-CM | POA: Diagnosis not present

## 2017-06-11 DIAGNOSIS — N2581 Secondary hyperparathyroidism of renal origin: Secondary | ICD-10-CM | POA: Diagnosis not present

## 2017-06-11 DIAGNOSIS — D631 Anemia in chronic kidney disease: Secondary | ICD-10-CM | POA: Diagnosis not present

## 2017-06-13 ENCOUNTER — Telehealth: Payer: Self-pay | Admitting: General Practice

## 2017-06-13 DIAGNOSIS — D631 Anemia in chronic kidney disease: Secondary | ICD-10-CM | POA: Diagnosis not present

## 2017-06-13 DIAGNOSIS — N2581 Secondary hyperparathyroidism of renal origin: Secondary | ICD-10-CM | POA: Diagnosis not present

## 2017-06-13 DIAGNOSIS — N186 End stage renal disease: Secondary | ICD-10-CM | POA: Diagnosis not present

## 2017-06-13 NOTE — Telephone Encounter (Signed)
Veneta Penton PA from [facility] called requesting medication refill on hydrocortisone (CORTEF) 5 MG tablet, needs clarification but is  Willing to prescribe short term for patient

## 2017-06-13 NOTE — Telephone Encounter (Signed)
I cannot assist with this - will forward to Dr. Jarold Song who has appt with him to become PCP as Zettie Pho PA is not here.

## 2017-06-16 DIAGNOSIS — N186 End stage renal disease: Secondary | ICD-10-CM | POA: Diagnosis not present

## 2017-06-16 DIAGNOSIS — N2581 Secondary hyperparathyroidism of renal origin: Secondary | ICD-10-CM | POA: Diagnosis not present

## 2017-06-16 DIAGNOSIS — D631 Anemia in chronic kidney disease: Secondary | ICD-10-CM | POA: Diagnosis not present

## 2017-06-16 NOTE — Telephone Encounter (Signed)
I spoke with the patient on the phone who informed me he had received a rx from his Nephrologist for Hydrocortisone (discharge instructions were ambiguous). He was made aware that he would need to keep his appointment with me to discuss his dosing [place] and new dose he would need but he stated he did not have the time to come over for a "crazy" blood pressure check. He then said "have a good day" and hung up. He is aware he will only receive a refill from me after he gets established with me.

## 2017-06-18 DIAGNOSIS — N2581 Secondary hyperparathyroidism of renal origin: Secondary | ICD-10-CM | POA: Diagnosis not present

## 2017-06-18 DIAGNOSIS — D631 Anemia in chronic kidney disease: Secondary | ICD-10-CM | POA: Diagnosis not present

## 2017-06-18 DIAGNOSIS — N186 End stage renal disease: Secondary | ICD-10-CM | POA: Diagnosis not present

## 2017-06-20 DIAGNOSIS — N186 End stage renal disease: Secondary | ICD-10-CM | POA: Diagnosis not present

## 2017-06-20 DIAGNOSIS — D631 Anemia in chronic kidney disease: Secondary | ICD-10-CM | POA: Diagnosis not present

## 2017-06-20 DIAGNOSIS — N2581 Secondary hyperparathyroidism of renal origin: Secondary | ICD-10-CM | POA: Diagnosis not present

## 2017-06-23 DIAGNOSIS — N186 End stage renal disease: Secondary | ICD-10-CM | POA: Diagnosis not present

## 2017-06-23 DIAGNOSIS — D631 Anemia in chronic kidney disease: Secondary | ICD-10-CM | POA: Diagnosis not present

## 2017-06-23 DIAGNOSIS — N2581 Secondary hyperparathyroidism of renal origin: Secondary | ICD-10-CM | POA: Diagnosis not present

## 2017-06-25 DIAGNOSIS — D631 Anemia in chronic kidney disease: Secondary | ICD-10-CM | POA: Diagnosis not present

## 2017-06-25 DIAGNOSIS — N2581 Secondary hyperparathyroidism of renal origin: Secondary | ICD-10-CM | POA: Diagnosis not present

## 2017-06-25 DIAGNOSIS — N186 End stage renal disease: Secondary | ICD-10-CM | POA: Diagnosis not present

## 2017-06-27 DIAGNOSIS — D631 Anemia in chronic kidney disease: Secondary | ICD-10-CM | POA: Diagnosis not present

## 2017-06-27 DIAGNOSIS — N2581 Secondary hyperparathyroidism of renal origin: Secondary | ICD-10-CM | POA: Diagnosis not present

## 2017-06-27 DIAGNOSIS — N186 End stage renal disease: Secondary | ICD-10-CM | POA: Diagnosis not present

## 2017-06-30 DIAGNOSIS — N2581 Secondary hyperparathyroidism of renal origin: Secondary | ICD-10-CM | POA: Diagnosis not present

## 2017-06-30 DIAGNOSIS — D631 Anemia in chronic kidney disease: Secondary | ICD-10-CM | POA: Diagnosis not present

## 2017-06-30 DIAGNOSIS — N186 End stage renal disease: Secondary | ICD-10-CM | POA: Diagnosis not present

## 2017-07-02 DIAGNOSIS — N2581 Secondary hyperparathyroidism of renal origin: Secondary | ICD-10-CM | POA: Diagnosis not present

## 2017-07-02 DIAGNOSIS — D631 Anemia in chronic kidney disease: Secondary | ICD-10-CM | POA: Diagnosis not present

## 2017-07-02 DIAGNOSIS — N186 End stage renal disease: Secondary | ICD-10-CM | POA: Diagnosis not present

## 2017-07-03 ENCOUNTER — Ambulatory Visit (INDEPENDENT_AMBULATORY_CARE_PROVIDER_SITE_OTHER): Payer: Medicare Other | Admitting: Podiatry

## 2017-07-03 ENCOUNTER — Encounter: Payer: Self-pay | Admitting: Podiatry

## 2017-07-03 DIAGNOSIS — L600 Ingrowing nail: Secondary | ICD-10-CM

## 2017-07-03 NOTE — Patient Instructions (Signed)

## 2017-07-03 NOTE — Progress Notes (Signed)
Subjective:    Patient ID: [name], male   DOB: 46 y.o.   MRN: [id number]   HPI patient states it is a very painful ingrown toenail on his right big toe and he's tried to trim it and soak it himself without relief. States he is in relatively good health but has a 20 year history of being on dialysis due to hypertension    Review of Systems  All other systems reviewed and are negative.       Objective:  Physical Exam  Constitutional: He appears well-developed and well-nourished.  Cardiovascular: Intact distal pulses.   Pulmonary/Chest: Breath sounds normal.  Musculoskeletal: Normal range of motion.  Neurological: He is alert.  Skin: Skin is warm and dry.  Nursing note and vitals reviewed. Neurovascular status was found to be intact with muscle strength adequate range of motion within normal limits with patient found to have incurvated right hallux medial border that's very tender when pressed and makes shoe gear difficult. There is distal redness but I did not note any active drainage associated with this ingrown toenail      Assessment:   Severe ingrown toenail deformity right hallux medial border that's painful      Plan:    H&P condition reviewed and discussed treatment options including risk of removing the nail border. Due to the pain is in and inability to take care of himself he wants it fixed and understands risk and signs consent form and today I infiltrated the right hallux 60 Milligan's I can Marcaine mixture and under sterile conditions I removed the medial border exposed matrix and applied phenol 3 applications 30 seconds followed by alcohol lavaged sterile dressing. Gave instructions on soaks and reappoint

## 2017-07-03 NOTE — Progress Notes (Signed)
   Subjective:    Patient ID: Marvin Roberson, male    DOB: 11-Jan-1971, 46 y.o.   MRN: [id number]  HPI  Chief Complaint  Patient presents with  . Nail Problem       Review of Systems  Constitutional: Positive for unexpected weight change.  Eyes: Positive for visual disturbance.  Gastrointestinal: Positive for nausea.  Endocrine: Positive for cold intolerance.  Musculoskeletal: Positive for myalgias.  Neurological: Positive for light-headedness.       Objective:   Physical Exam        Assessment & Plan:

## 2017-07-04 DIAGNOSIS — N2581 Secondary hyperparathyroidism of renal origin: Secondary | ICD-10-CM | POA: Diagnosis not present

## 2017-07-04 DIAGNOSIS — I120 Hypertensive chronic kidney disease with stage 5 chronic kidney disease or end stage renal disease: Secondary | ICD-10-CM | POA: Diagnosis not present

## 2017-07-04 DIAGNOSIS — Z992 Dependence on renal dialysis: Secondary | ICD-10-CM | POA: Diagnosis not present

## 2017-07-04 DIAGNOSIS — N186 End stage renal disease: Secondary | ICD-10-CM | POA: Diagnosis not present

## 2017-07-04 DIAGNOSIS — D631 Anemia in chronic kidney disease: Secondary | ICD-10-CM | POA: Diagnosis not present

## 2017-07-07 DIAGNOSIS — N2581 Secondary hyperparathyroidism of renal origin: Secondary | ICD-10-CM | POA: Diagnosis not present

## 2017-07-07 DIAGNOSIS — N186 End stage renal disease: Secondary | ICD-10-CM | POA: Diagnosis not present

## 2017-07-07 DIAGNOSIS — D631 Anemia in chronic kidney disease: Secondary | ICD-10-CM | POA: Diagnosis not present

## 2017-07-09 ENCOUNTER — Ambulatory Visit: Payer: Medicare Other | Attending: Family Medicine | Admitting: Family Medicine

## 2017-07-09 ENCOUNTER — Encounter: Payer: Self-pay | Admitting: Family Medicine

## 2017-07-09 VITALS — BP 221/138 | HR 71 | Temp 97.9°F | Ht 69.0 in | Wt 142.4 lb

## 2017-07-09 DIAGNOSIS — Z992 Dependence on renal dialysis: Secondary | ICD-10-CM | POA: Diagnosis not present

## 2017-07-09 DIAGNOSIS — Z79899 Other long term (current) drug therapy: Secondary | ICD-10-CM | POA: Diagnosis not present

## 2017-07-09 DIAGNOSIS — Z7982 Long term (current) use of aspirin: Secondary | ICD-10-CM | POA: Diagnosis not present

## 2017-07-09 DIAGNOSIS — E2740 Unspecified adrenocortical insufficiency: Secondary | ICD-10-CM | POA: Insufficient documentation

## 2017-07-09 DIAGNOSIS — D631 Anemia in chronic kidney disease: Secondary | ICD-10-CM | POA: Diagnosis not present

## 2017-07-09 DIAGNOSIS — I120 Hypertensive chronic kidney disease with stage 5 chronic kidney disease or end stage renal disease: Secondary | ICD-10-CM | POA: Diagnosis not present

## 2017-07-09 DIAGNOSIS — Z8673 Personal history of transient ischemic attack (TIA), and cerebral infarction without residual deficits: Secondary | ICD-10-CM | POA: Diagnosis not present

## 2017-07-09 DIAGNOSIS — K219 Gastro-esophageal reflux disease without esophagitis: Secondary | ICD-10-CM | POA: Insufficient documentation

## 2017-07-09 DIAGNOSIS — E7800 Pure hypercholesterolemia, unspecified: Secondary | ICD-10-CM | POA: Insufficient documentation

## 2017-07-09 DIAGNOSIS — N186 End stage renal disease: Secondary | ICD-10-CM | POA: Insufficient documentation

## 2017-07-09 DIAGNOSIS — N2581 Secondary hyperparathyroidism of renal origin: Secondary | ICD-10-CM | POA: Diagnosis not present

## 2017-07-09 DIAGNOSIS — I10 Essential (primary) hypertension: Secondary | ICD-10-CM

## 2017-07-09 MED ORDER — HYDROCORTISONE 5 MG PO TABS: 5.0000 mg | ORAL_TABLET | Freq: Two times a day (BID) | ORAL | 0 refills | Status: DC

## 2017-07-09 NOTE — Progress Notes (Signed)
Subjective:  Patient ID: Marvin Roberson, male    DOB: 1971-01-06  Age: 46 y.o. MRN: [id number]  CC: Hypertension   HPI Marvin Roberson is a 46 year old male with a history of hypertension, end-stage renal disease on hemodialysis (Mondays, Wednesdays and Fridays), adrenal insufficiency (on chronic hydrocortisone) who presents to the clinic to establish care with me.  He lived in [place] until one year ago and informs me he has plans of returning back in the next 2-3 months. He was followed by an endocrinologist while in [facility] but does not have one here and is requesting a refill of his hydrocortisone.  His blood pressure is significantly elevated and he endorses not taking his antihypertensives until he got into the exam room this morning. He is also scheduled for hemodialysis session this morning. He has no acute concerns today and declines flu shot  Past Medical History:  Diagnosis Date  . Adrenal insufficiency ([place])   . Dyspnea    "when I have too much fluid"  . ESRD on dialysis [facility]) since 1990s   "TTS; [address]" (11/27/2016)  . GERD (gastroesophageal reflux disease)   . High cholesterol   . History of blood transfusion   . Hypertension   . Kidney failure   . Renal insufficiency   . Stroke [facility]) 2016   decreased vision in his left eye/notes 11/27/2016    Past Surgical History:  Procedure Laterality Date  . ANGIOPLASTY Left 12/11/2016   Procedure: ANGIOPLASTY LEFT ARM & SUBCLAVIAN ARTERY;  Surgeon: Conrad Raven, MD;  Location: [place];  Service: Vascular;  Laterality: Left;  . arm surgery Left 2016   "for aneurysm"  . AV FISTULA PLACEMENT    . FALSE ANEURYSM REPAIR Left 12/11/2016   Procedure: RESECTION  BRACHIAL ARTERY;  Surgeon: Conrad Cotulla, MD;  Location: [place];  Service: Vascular;  Laterality: Left;  . INSERTION OF DIALYSIS CATHETER Right    chest  . THROMBECTOMY BRACHIAL ARTERY  12/11/2016   Procedure: THROMBECTOMY BRACHIAL ARTERY AND ULNAR;  Surgeon: Conrad [place], MD;   Location: [place];  Service: Vascular;;  . THYROIDECTOMY    . UPPER EXTREMITY ANGIOGRAM Left 12/11/2016   Procedure: LEFT ARM ANGIOGRAM;  Surgeon: Conrad [place], MD;  Location: [place];  Service: Vascular;  Laterality: Left;  . WOUND EXPLORATION Left 12/11/2016   Procedure: LEFT ARM BRACHIAL ARTERY WITH INTERPOSTIONAL GRAFT;  Surgeon: Conrad [place], MD;  Location: [place];  Service: Vascular;  Laterality: Left;    Allergies  Allergen Reactions  . Codeine Nausea And Vomiting  . Gabapentin Other (See Comments)    Sleepiness, altered mental state  . Lisinopril Other (See Comments) and Swelling    angioedema  . Povidone-Iodine Itching  . Betadine [Povidone Iodine] Rash     Outpatient Medications Prior to Visit  Medication Sig Dispense Refill  . aspirin EC 81 MG tablet Take 81 mg by mouth daily.    Marland Kitchen atorvastatin (LIPITOR) 40 MG tablet Take 40 mg by mouth daily.    . carvedilol (COREG) 25 MG tablet Take 25 mg by mouth 2 (two) times daily with a meal.    . hydrALAZINE (APRESOLINE) 100 MG tablet Take 100 mg by mouth 3 (three) times daily.    Marland Kitchen lactulose (CEPHULAC) 20 g packet Take 1 packet (20 g total) by mouth daily as needed (constipation). 30 each 0  . losartan (COZAAR) 100 MG tablet Take 100 mg by mouth daily.    Marland Kitchen omeprazole (  PRILOSEC) 40 MG capsule Take 40 mg by mouth daily.    Marland Kitchen HYDROcodone-acetaminophen (NORCO/VICODIN) 5-325 MG tablet Take 1-2 tablets by mouth every 6 (six) hours as needed. (Patient not taking: Reported on 07/09/2017) 15 tablet 0  . hydrocortisone (CORTEF) 5 MG tablet Take 1 tablet (5 mg total) by mouth 2 (two) times daily. Take 3 tablets (15 mg) every morning (Patient not taking: Reported on 07/09/2017) 28 tablet 0   No facility-administered medications prior to visit.     ROS Review of Systems  Constitutional: Negative for activity change and appetite change.  HENT: Negative for sinus pressure and sore throat.   Eyes: Negative for visual disturbance.  Respiratory: Negative  for cough, chest tightness and shortness of breath.   Cardiovascular: Negative for chest pain and leg swelling.  Gastrointestinal: Negative for abdominal distention, abdominal pain, constipation and diarrhea.  Endocrine: Negative.   Genitourinary: Negative for dysuria.  Musculoskeletal: Negative for joint swelling and myalgias.  Skin: Negative for rash.  Allergic/Immunologic: Negative.   Neurological: Negative for weakness, light-headedness and numbness.  Psychiatric/Behavioral: Negative for dysphoric mood and suicidal ideas.    Objective:  BP (!) 221/138   Pulse 71   Temp 97.9 F (36.6 C) (Oral)   Ht 5\' 9"  (1.753 m)   Wt 142 lb 6.4 oz (64.6 kg)   SpO2 98%   BMI 21.03 kg/m   BP/Weight 07/09/2017 06/10/2017 0/93/2355  Systolic BP 732 202 542  Diastolic BP 706 237 628  Wt. (Lbs) 142.4 - 140.9  BMI 21.03 - 20.81      Physical Exam  Constitutional: He is oriented to person, place, and time. He appears well-developed and well-nourished.  Cardiovascular: Normal rate, normal heart sounds and intact distal pulses.   No murmur heard. Pulmonary/Chest: Effort normal and breath sounds normal. He has no wheezes. He has no rales. He exhibits no tenderness.  Right upper chest wall hemodialysis permacath  Abdominal: Soft. Bowel sounds are normal. He exhibits no distension and no mass. There is no tenderness.  Musculoskeletal: Normal range of motion.  Neurological: He is alert and oriented to person, place, and time.  Skin: Skin is warm and dry.  Psychiatric: He has a normal mood and affect.     Assessment & Plan:   1. Accelerated hypertension Uncontrolled He just took his antihypertensives 5 minutes ago; will hold off on administering clonidine Scheduled for dialysis this morning which will also bring down his blood pressure Emphasized the need for compliance with medications, low-sodium and DASH diet  2. ESRD on hemodialysis [facility]) Continue hemodialysis as per schedule-Mondays,  Wednesdays and Fridays  3. Adrenal insufficiency (HCC) Continue hydrocortisone 5 mg tablets twice daily - Ambulatory referral to Endocrinology - hydrocortisone (CORTEF) 5 MG tablet; Take 1 tablet (5 mg total) by mouth 2 (two) times daily.  Dispense: 60 tablet; Refill: 0   Meds ordered this encounter  Medications  . hydrocortisone (CORTEF) 5 MG tablet    Sig: Take 1 tablet (5 mg total) by mouth 2 (two) times daily.    Dispense:  60 tablet    Refill:  0    Follow-up: Return in about 3 months (around 10/08/2017) for Follow-up on hypertension and adrenal insufficiency.   Arnoldo Morale MD

## 2017-07-09 NOTE — Patient Instructions (Signed)
Addison Disease Addison disease, which is also called primary adrenal insufficiency, is a condition in which the adrenal glands do not make enough of the hormones cortisol and aldosterone. The disease causes blood pressure to drop and causes potassium to build up to dangerous levels. If Addison disease is not treated, it can suddenly get worse and become a life-threatening condition. A sudden worsening of the disease is called an addisonian crisis. What are the causes? This condition may be caused by:  A disease in which the body's own immune system damages the adrenal glands.  An infection of the adrenal glands.  Bleeding (hemorrhage) in the adrenal glands.  A tumor.  What are the signs or symptoms? Common symptoms of this condition include:  Severe fatigue.  Muscle weakness.  Loss of appetite.  Weight loss.  Darkening of the skin.  Drops in blood pressure.  Other symptoms include:  Nausea or vomiting.  Diarrhea.  Dizziness or fainting.  Irritability  Depression.  Salt cravings.  Low blood sugar (hypoglycemia).  Irregular or no menstrual periods.  Symptoms usually develop slowly and get worse gradually. How is this diagnosed? This condition may be diagnosed based on your medical history, symptoms, and lab test results. The lab tests include a measurement of your blood cortisol levels. You may also have a CT scan or MRI of the adrenal and pituitary glands. How is this treated? This condition cannot be cured, but it can be managed with medicines that replace cortisol and aldosterone. These medicines may need to be taken several times per day. If you become so sick that you are unable to take these medicines by mouth or you are unable to keep them down, you will need to get them through an injection. Illness, stress, and surgery can increase your body's need for cortisol. It is very important that you talk with your health care provider and understand how to adjust  your medicine dosages if you become ill or stressed or if you are to have surgery. Follow these instructions at home:  Take over-the-counter and prescription medicines only as told by your health care provider.  Know how to increase your medicine dosage during periods of stress or mild illness.  When you travel, carry a needle, a syringe, and an injectable form of cortisol in case of an emergency.  In case of an emergency, wear a warning bracelet or neck chain so that others understand that you have Addison disease.  Carry an ID card that states that you have Addison disease. The card should include: ? Instructions to inject a certain amount of medicine if you are severely hurt or cannot respond. ? The name and phone number of your health care provider. ? The name and phone number of your closest relative. Contact a health care provider if:  You get sick with another illness.  You develop new symptoms. Get help right away if:  You have a severe infection or other illness.  You have severe vomiting or diarrhea.  You find it necessary to give yourself injectable medicine.  You have symptoms of an addisonian crisis. These include: ? Sudden, severe pain in the lower back, abdomen, or legs. ? Severe vomiting and diarrhea. ? Dehydration. ? Low blood pressure. ? Loss of consciousness. This information is not intended to replace advice given to you by your health care provider. Make sure you discuss any questions you have with your health care provider. Document Released: 10/21/2005 Document Revised: 05/10/2016 Document Reviewed: 12/15/2014 Elsevier Interactive Patient  Education  2018 [employer]vier Inc.  

## 2017-07-11 DIAGNOSIS — N186 End stage renal disease: Secondary | ICD-10-CM | POA: Diagnosis not present

## 2017-07-11 DIAGNOSIS — D631 Anemia in chronic kidney disease: Secondary | ICD-10-CM | POA: Diagnosis not present

## 2017-07-11 DIAGNOSIS — N2581 Secondary hyperparathyroidism of renal origin: Secondary | ICD-10-CM | POA: Diagnosis not present

## 2017-07-14 DIAGNOSIS — D631 Anemia in chronic kidney disease: Secondary | ICD-10-CM | POA: Diagnosis not present

## 2017-07-14 DIAGNOSIS — N2581 Secondary hyperparathyroidism of renal origin: Secondary | ICD-10-CM | POA: Diagnosis not present

## 2017-07-14 DIAGNOSIS — N186 End stage renal disease: Secondary | ICD-10-CM | POA: Diagnosis not present

## 2017-07-16 DIAGNOSIS — D631 Anemia in chronic kidney disease: Secondary | ICD-10-CM | POA: Diagnosis not present

## 2017-07-16 DIAGNOSIS — N2581 Secondary hyperparathyroidism of renal origin: Secondary | ICD-10-CM | POA: Diagnosis not present

## 2017-07-16 DIAGNOSIS — N186 End stage renal disease: Secondary | ICD-10-CM | POA: Diagnosis not present

## 2017-07-18 DIAGNOSIS — N2581 Secondary hyperparathyroidism of renal origin: Secondary | ICD-10-CM | POA: Diagnosis not present

## 2017-07-18 DIAGNOSIS — D631 Anemia in chronic kidney disease: Secondary | ICD-10-CM | POA: Diagnosis not present

## 2017-07-18 DIAGNOSIS — N186 End stage renal disease: Secondary | ICD-10-CM | POA: Diagnosis not present

## 2017-07-21 DIAGNOSIS — D631 Anemia in chronic kidney disease: Secondary | ICD-10-CM | POA: Diagnosis not present

## 2017-07-21 DIAGNOSIS — N2581 Secondary hyperparathyroidism of renal origin: Secondary | ICD-10-CM | POA: Diagnosis not present

## 2017-07-21 DIAGNOSIS — N186 End stage renal disease: Secondary | ICD-10-CM | POA: Diagnosis not present

## 2017-07-23 DIAGNOSIS — N2581 Secondary hyperparathyroidism of renal origin: Secondary | ICD-10-CM | POA: Diagnosis not present

## 2017-07-23 DIAGNOSIS — N186 End stage renal disease: Secondary | ICD-10-CM | POA: Diagnosis not present

## 2017-07-23 DIAGNOSIS — D631 Anemia in chronic kidney disease: Secondary | ICD-10-CM | POA: Diagnosis not present

## 2017-07-25 DIAGNOSIS — N2581 Secondary hyperparathyroidism of renal origin: Secondary | ICD-10-CM | POA: Diagnosis not present

## 2017-07-25 DIAGNOSIS — N186 End stage renal disease: Secondary | ICD-10-CM | POA: Diagnosis not present

## 2017-07-25 DIAGNOSIS — D631 Anemia in chronic kidney disease: Secondary | ICD-10-CM | POA: Diagnosis not present

## 2017-07-28 DIAGNOSIS — N2581 Secondary hyperparathyroidism of renal origin: Secondary | ICD-10-CM | POA: Diagnosis not present

## 2017-07-28 DIAGNOSIS — N186 End stage renal disease: Secondary | ICD-10-CM | POA: Diagnosis not present

## 2017-07-28 DIAGNOSIS — D631 Anemia in chronic kidney disease: Secondary | ICD-10-CM | POA: Diagnosis not present

## 2017-07-30 DIAGNOSIS — N2581 Secondary hyperparathyroidism of renal origin: Secondary | ICD-10-CM | POA: Diagnosis not present

## 2017-07-30 DIAGNOSIS — D631 Anemia in chronic kidney disease: Secondary | ICD-10-CM | POA: Diagnosis not present

## 2017-07-30 DIAGNOSIS — N186 End stage renal disease: Secondary | ICD-10-CM | POA: Diagnosis not present

## 2017-08-01 DIAGNOSIS — D631 Anemia in chronic kidney disease: Secondary | ICD-10-CM | POA: Diagnosis not present

## 2017-08-01 DIAGNOSIS — N2581 Secondary hyperparathyroidism of renal origin: Secondary | ICD-10-CM | POA: Diagnosis not present

## 2017-08-01 DIAGNOSIS — N186 End stage renal disease: Secondary | ICD-10-CM | POA: Diagnosis not present

## 2017-08-03 DIAGNOSIS — N186 End stage renal disease: Secondary | ICD-10-CM | POA: Diagnosis not present

## 2017-08-03 DIAGNOSIS — I129 Hypertensive chronic kidney disease with stage 1 through stage 4 chronic kidney disease, or unspecified chronic kidney disease: Secondary | ICD-10-CM | POA: Diagnosis not present

## 2017-08-03 DIAGNOSIS — Z992 Dependence on renal dialysis: Secondary | ICD-10-CM | POA: Diagnosis not present

## 2017-08-04 DIAGNOSIS — D631 Anemia in chronic kidney disease: Secondary | ICD-10-CM | POA: Diagnosis not present

## 2017-08-04 DIAGNOSIS — N186 End stage renal disease: Secondary | ICD-10-CM | POA: Diagnosis not present

## 2017-08-04 DIAGNOSIS — N2581 Secondary hyperparathyroidism of renal origin: Secondary | ICD-10-CM | POA: Diagnosis not present

## 2017-08-06 DIAGNOSIS — N186 End stage renal disease: Secondary | ICD-10-CM | POA: Diagnosis not present

## 2017-08-06 DIAGNOSIS — N2581 Secondary hyperparathyroidism of renal origin: Secondary | ICD-10-CM | POA: Diagnosis not present

## 2017-08-06 DIAGNOSIS — D631 Anemia in chronic kidney disease: Secondary | ICD-10-CM | POA: Diagnosis not present

## 2017-08-08 DIAGNOSIS — N2581 Secondary hyperparathyroidism of renal origin: Secondary | ICD-10-CM | POA: Diagnosis not present

## 2017-08-08 DIAGNOSIS — D631 Anemia in chronic kidney disease: Secondary | ICD-10-CM | POA: Diagnosis not present

## 2017-08-08 DIAGNOSIS — N186 End stage renal disease: Secondary | ICD-10-CM | POA: Diagnosis not present

## 2017-08-11 DIAGNOSIS — N2581 Secondary hyperparathyroidism of renal origin: Secondary | ICD-10-CM | POA: Diagnosis not present

## 2017-08-11 DIAGNOSIS — N186 End stage renal disease: Secondary | ICD-10-CM | POA: Diagnosis not present

## 2017-08-11 DIAGNOSIS — D631 Anemia in chronic kidney disease: Secondary | ICD-10-CM | POA: Diagnosis not present

## 2017-08-12 ENCOUNTER — Other Ambulatory Visit: Payer: Self-pay | Admitting: Family Medicine

## 2017-08-12 DIAGNOSIS — E2740 Unspecified adrenocortical insufficiency: Secondary | ICD-10-CM

## 2017-08-12 MED ORDER — HYDROCORTISONE 5 MG PO TABS: 5.0000 mg | ORAL_TABLET | Freq: Two times a day (BID) | ORAL | 0 refills | Status: DC

## 2017-08-13 DIAGNOSIS — N186 End stage renal disease: Secondary | ICD-10-CM | POA: Diagnosis not present

## 2017-08-13 DIAGNOSIS — N2581 Secondary hyperparathyroidism of renal origin: Secondary | ICD-10-CM | POA: Diagnosis not present

## 2017-08-13 DIAGNOSIS — D631 Anemia in chronic kidney disease: Secondary | ICD-10-CM | POA: Diagnosis not present

## 2017-08-18 DIAGNOSIS — N2581 Secondary hyperparathyroidism of renal origin: Secondary | ICD-10-CM | POA: Diagnosis not present

## 2017-08-18 DIAGNOSIS — D631 Anemia in chronic kidney disease: Secondary | ICD-10-CM | POA: Diagnosis not present

## 2017-08-18 DIAGNOSIS — N186 End stage renal disease: Secondary | ICD-10-CM | POA: Diagnosis not present

## 2017-08-19 ENCOUNTER — Ambulatory Visit: Payer: Medicare Other | Admitting: Endocrinology

## 2017-08-20 DIAGNOSIS — N2581 Secondary hyperparathyroidism of renal origin: Secondary | ICD-10-CM | POA: Diagnosis not present

## 2017-08-20 DIAGNOSIS — D631 Anemia in chronic kidney disease: Secondary | ICD-10-CM | POA: Diagnosis not present

## 2017-08-20 DIAGNOSIS — N186 End stage renal disease: Secondary | ICD-10-CM | POA: Diagnosis not present

## 2017-08-22 DIAGNOSIS — N2581 Secondary hyperparathyroidism of renal origin: Secondary | ICD-10-CM | POA: Diagnosis not present

## 2017-08-22 DIAGNOSIS — N186 End stage renal disease: Secondary | ICD-10-CM | POA: Diagnosis not present

## 2017-08-22 DIAGNOSIS — D631 Anemia in chronic kidney disease: Secondary | ICD-10-CM | POA: Diagnosis not present

## 2017-08-25 DIAGNOSIS — D631 Anemia in chronic kidney disease: Secondary | ICD-10-CM | POA: Diagnosis not present

## 2017-08-25 DIAGNOSIS — N2581 Secondary hyperparathyroidism of renal origin: Secondary | ICD-10-CM | POA: Diagnosis not present

## 2017-08-25 DIAGNOSIS — N186 End stage renal disease: Secondary | ICD-10-CM | POA: Diagnosis not present

## 2017-08-27 DIAGNOSIS — N2581 Secondary hyperparathyroidism of renal origin: Secondary | ICD-10-CM | POA: Diagnosis not present

## 2017-08-27 DIAGNOSIS — D631 Anemia in chronic kidney disease: Secondary | ICD-10-CM | POA: Diagnosis not present

## 2017-08-27 DIAGNOSIS — N186 End stage renal disease: Secondary | ICD-10-CM | POA: Diagnosis not present

## 2017-08-29 DIAGNOSIS — D631 Anemia in chronic kidney disease: Secondary | ICD-10-CM | POA: Diagnosis not present

## 2017-08-29 DIAGNOSIS — N2581 Secondary hyperparathyroidism of renal origin: Secondary | ICD-10-CM | POA: Diagnosis not present

## 2017-08-29 DIAGNOSIS — N186 End stage renal disease: Secondary | ICD-10-CM | POA: Diagnosis not present

## 2017-09-01 DIAGNOSIS — N2581 Secondary hyperparathyroidism of renal origin: Secondary | ICD-10-CM | POA: Diagnosis not present

## 2017-09-01 DIAGNOSIS — N186 End stage renal disease: Secondary | ICD-10-CM | POA: Diagnosis not present

## 2017-09-01 DIAGNOSIS — D631 Anemia in chronic kidney disease: Secondary | ICD-10-CM | POA: Diagnosis not present

## 2017-09-03 DIAGNOSIS — N186 End stage renal disease: Secondary | ICD-10-CM | POA: Diagnosis not present

## 2017-09-03 DIAGNOSIS — N2581 Secondary hyperparathyroidism of renal origin: Secondary | ICD-10-CM | POA: Diagnosis not present

## 2017-09-03 DIAGNOSIS — Z992 Dependence on renal dialysis: Secondary | ICD-10-CM | POA: Diagnosis not present

## 2017-09-03 DIAGNOSIS — D631 Anemia in chronic kidney disease: Secondary | ICD-10-CM | POA: Diagnosis not present

## 2017-09-03 DIAGNOSIS — I129 Hypertensive chronic kidney disease with stage 1 through stage 4 chronic kidney disease, or unspecified chronic kidney disease: Secondary | ICD-10-CM | POA: Diagnosis not present

## 2017-09-05 DIAGNOSIS — D509 Iron deficiency anemia, unspecified: Secondary | ICD-10-CM | POA: Diagnosis not present

## 2017-09-05 DIAGNOSIS — D689 Coagulation defect, unspecified: Secondary | ICD-10-CM | POA: Diagnosis not present

## 2017-09-05 DIAGNOSIS — D631 Anemia in chronic kidney disease: Secondary | ICD-10-CM | POA: Diagnosis not present

## 2017-09-05 DIAGNOSIS — N2581 Secondary hyperparathyroidism of renal origin: Secondary | ICD-10-CM | POA: Diagnosis not present

## 2017-09-05 DIAGNOSIS — N186 End stage renal disease: Secondary | ICD-10-CM | POA: Diagnosis not present

## 2017-09-07 ENCOUNTER — Other Ambulatory Visit: Payer: Self-pay | Admitting: Family Medicine

## 2017-09-07 DIAGNOSIS — E2740 Unspecified adrenocortical insufficiency: Secondary | ICD-10-CM

## 2017-09-08 DIAGNOSIS — D509 Iron deficiency anemia, unspecified: Secondary | ICD-10-CM | POA: Diagnosis not present

## 2017-09-08 DIAGNOSIS — D689 Coagulation defect, unspecified: Secondary | ICD-10-CM | POA: Diagnosis not present

## 2017-09-08 DIAGNOSIS — N2581 Secondary hyperparathyroidism of renal origin: Secondary | ICD-10-CM | POA: Diagnosis not present

## 2017-09-08 DIAGNOSIS — D631 Anemia in chronic kidney disease: Secondary | ICD-10-CM | POA: Diagnosis not present

## 2017-09-08 DIAGNOSIS — N186 End stage renal disease: Secondary | ICD-10-CM | POA: Diagnosis not present

## 2017-09-10 DIAGNOSIS — N186 End stage renal disease: Secondary | ICD-10-CM | POA: Diagnosis not present

## 2017-09-10 DIAGNOSIS — D631 Anemia in chronic kidney disease: Secondary | ICD-10-CM | POA: Diagnosis not present

## 2017-09-10 DIAGNOSIS — D689 Coagulation defect, unspecified: Secondary | ICD-10-CM | POA: Diagnosis not present

## 2017-09-10 DIAGNOSIS — D509 Iron deficiency anemia, unspecified: Secondary | ICD-10-CM | POA: Diagnosis not present

## 2017-09-10 DIAGNOSIS — N2581 Secondary hyperparathyroidism of renal origin: Secondary | ICD-10-CM | POA: Diagnosis not present

## 2017-09-12 DIAGNOSIS — D631 Anemia in chronic kidney disease: Secondary | ICD-10-CM | POA: Diagnosis not present

## 2017-09-12 DIAGNOSIS — D689 Coagulation defect, unspecified: Secondary | ICD-10-CM | POA: Diagnosis not present

## 2017-09-12 DIAGNOSIS — D509 Iron deficiency anemia, unspecified: Secondary | ICD-10-CM | POA: Diagnosis not present

## 2017-09-12 DIAGNOSIS — N2581 Secondary hyperparathyroidism of renal origin: Secondary | ICD-10-CM | POA: Diagnosis not present

## 2017-09-12 DIAGNOSIS — N186 End stage renal disease: Secondary | ICD-10-CM | POA: Diagnosis not present

## 2017-09-15 DIAGNOSIS — D631 Anemia in chronic kidney disease: Secondary | ICD-10-CM | POA: Diagnosis not present

## 2017-09-15 DIAGNOSIS — D509 Iron deficiency anemia, unspecified: Secondary | ICD-10-CM | POA: Diagnosis not present

## 2017-09-15 DIAGNOSIS — D689 Coagulation defect, unspecified: Secondary | ICD-10-CM | POA: Diagnosis not present

## 2017-09-15 DIAGNOSIS — N186 End stage renal disease: Secondary | ICD-10-CM | POA: Diagnosis not present

## 2017-09-15 DIAGNOSIS — N2581 Secondary hyperparathyroidism of renal origin: Secondary | ICD-10-CM | POA: Diagnosis not present

## 2017-09-17 DIAGNOSIS — N186 End stage renal disease: Secondary | ICD-10-CM | POA: Diagnosis not present

## 2017-09-17 DIAGNOSIS — D631 Anemia in chronic kidney disease: Secondary | ICD-10-CM | POA: Diagnosis not present

## 2017-09-17 DIAGNOSIS — D509 Iron deficiency anemia, unspecified: Secondary | ICD-10-CM | POA: Diagnosis not present

## 2017-09-17 DIAGNOSIS — D689 Coagulation defect, unspecified: Secondary | ICD-10-CM | POA: Diagnosis not present

## 2017-09-17 DIAGNOSIS — N2581 Secondary hyperparathyroidism of renal origin: Secondary | ICD-10-CM | POA: Diagnosis not present

## 2017-09-18 ENCOUNTER — Encounter: Payer: Self-pay | Admitting: Endocrinology

## 2017-09-18 ENCOUNTER — Ambulatory Visit (INDEPENDENT_AMBULATORY_CARE_PROVIDER_SITE_OTHER): Payer: Medicare Other | Admitting: Endocrinology

## 2017-09-18 VITALS — BP 149/99 | HR 79 | Wt 143.6 lb

## 2017-09-18 DIAGNOSIS — E2740 Unspecified adrenocortical insufficiency: Secondary | ICD-10-CM

## 2017-09-18 NOTE — Patient Instructions (Signed)
please stop taking the hydrocortisone pill after Monday morning's pill.   Please have the test here on Tuesday, 1 PM (blood test, shot, repeat blood test 45 minutes later).  We'll let you know about the results.

## 2017-09-18 NOTE — Progress Notes (Signed)
Subjective:    Patient ID: Marvin Roberson, male    DOB: 14-Oct-1971, 46 y.o.   MRN: [id number]  HPI Pt is referred by Dr Jarold Song, for hypocortisolemia.  Pt was noted in January, 2018, to have low cortisol level.  no h/o abdominal or brain injury.  No h/o cancer, thyroid problems, seizures, hypoglycemia, amyloidosis, tuberculosis, or diabetes.  He has been off and on low-dose cortef x approx 8 years.   No h/o ketoconazole, rifampin, or dilantin.  He has been on HD (ESRD is due to HTN) x 20 years.  He had parathyroidect a few years ago.  He has intermitt dizziness.  A few mos ago, he had assoc syncope. He says severe labile HTN is chronic. He says prednisone caused excessive swelling of the face.  He says he stopped eating when he stopped it.   Past Medical History:  Diagnosis Date  . Adrenal insufficiency ([place])   . Dyspnea    "when I have too much fluid"  . ESRD on dialysis [facility]) since 1990s   "TTS; [address]" (11/27/2016)  . GERD (gastroesophageal reflux disease)   . High cholesterol   . History of blood transfusion   . Hypertension   . Kidney failure   . Renal insufficiency   . Stroke [facility]) 2016   decreased vision in his left eye/notes 11/27/2016    Past Surgical History:  Procedure Laterality Date  . ANGIOPLASTY LEFT ARM & SUBCLAVIAN ARTERY Left 12/11/2016   Performed by Conrad [place], MD at [facility] OR  . arm surgery Left 2016   "for aneurysm"  . AV FISTULA PLACEMENT    . INSERTION OF DIALYSIS CATHETER Right    chest  . LEFT ARM ANGIOGRAM Left 12/11/2016   Performed by Conrad [place], MD at [place]  . LEFT ARM BRACHIAL ARTERY WITH INTERPOSTIONAL GRAFT Left 12/11/2016   Performed by Conrad [place], MD at [place] Left 12/11/2016   Performed by Conrad [place], MD at [place]  . THROMBECTOMY BRACHIAL ARTERY AND ULNAR  12/11/2016   Performed by Conrad [place], MD at [place] History   Socioeconomic History  . Marital status: Single    Spouse name:  Not on file  . Number of children: Not on file  . Years of education: Not on file  . Highest education level: Not on file  Social Needs  . Financial resource strain: Not on file  . Food insecurity - worry: Not on file  . Food insecurity - inability: Not on file  . Transportation needs - medical: Not on file  . Transportation needs - non-medical: Not on file  Occupational History  . Not on file  Tobacco Use  . Smoking status: Current Every Day Smoker    Packs/day: 0.12    Years: 20.00    Pack years: 2.40    Types: Cigarettes  . Smokeless tobacco: Never Used  . Tobacco comment: Less than 1/2 pk per day  Substance and Sexual Activity  . Alcohol use: No  . Drug use: No  . Sexual activity: Yes  Other Topics Concern  . Not on file  Social History Narrative  . Not on file    Current Outpatient Medications on File Prior to Visit  Medication Sig Dispense Refill  . aspirin EC 81 MG tablet Take 81 mg by mouth daily.    Marland Kitchen atorvastatin (LIPITOR) 40 MG  tablet Take 40 mg by mouth daily.    . carvedilol (COREG) 25 MG tablet Take 25 mg by mouth 2 (two) times daily with a meal.    . hydrALAZINE (APRESOLINE) 100 MG tablet Take 100 mg by mouth 3 (three) times daily.    Marland Kitchen HYDROcodone-acetaminophen (NORCO/VICODIN) 5-325 MG tablet Take 1-2 tablets by mouth every 6 (six) hours as needed. (Patient not taking: Reported on 07/09/2017) 15 tablet 0  . hydrocortisone (CORTEF) 5 MG tablet TAKE 1 TABLET BY MOUTH TWICE A DAY 30 tablet 0  . lactulose (CEPHULAC) 20 g packet Take 1 packet (20 g total) by mouth daily as needed (constipation). 30 each 0  . losartan (COZAAR) 100 MG tablet Take 100 mg by mouth daily.    Marland Kitchen omeprazole (PRILOSEC) 40 MG capsule Take 40 mg by mouth daily.     No current facility-administered medications on file prior to visit.     Allergies  Allergen Reactions  . Codeine Nausea And Vomiting  . Gabapentin Other (See Comments)    Sleepiness, altered mental state  . Lisinopril Other  (See Comments) and Swelling    angioedema  . Povidone-Iodine Itching  . Betadine [Povidone Iodine] Rash    Family History  Problem Relation Age of Onset  . Hypertension Other   . Adrenal disorder Neg Hx     BP (!) 149/99 (BP Location: Right Leg, Cuff Size: Normal)   Pulse 79   Wt 143 lb 9.6 oz (65.1 kg)   SpO2 96%   BMI 21.21 kg/m     Review of Systems Denies fever, weight loss, fatigue, headache, sob, palpitations, blurry vision, n/v, abd pain, forgetfulness, seizure, muscle weakness, easy bruising, diarrhea, vitiligo, excessive sweating, or dry skin. He has cold intolerance and anxiety.     Objective:   Physical Exam VS: see vs page GEN: no distress HEAD: head: no deformity eyes: no periorbital swelling, no proptosis external nose and ears are normal mouth: no lesion seen NECK: supple, thyroid is not enlarged CHEST WALL: no deformity LUNGS: clear to auscultation CV: reg rate and rhythm, no murmur ABD: abdomen is soft, nontender.  no hepatosplenomegaly.  not distended.  no hernia MUSCULOSKELETAL: muscle bulk and strength are grossly normal.  no obvious joint swelling.  gait is normal and steady EXTEMITIES: no deformity.  no edema PULSES: no carotid bruit NEURO:  cn 2-12 grossly intact.   readily moves all 4's.  sensation is intact to touch on all 4's SKIN:  Normal texture and temperature.  No rash or suspicious lesion is visible.   NODES:  None palpable at the neck PSYCH: alert, well-oriented.  Does not appear anxious nor depressed.   Lab Results  Component Value Date   CREATININE 14.33 (H) 05/26/2017   BUN 76 (H) 05/26/2017   NA 133 (L) 05/26/2017   K 5.1 05/26/2017   CL 100 (L) 05/26/2017   CO2 19 (L) 05/26/2017   I personally reviewed electrocardiogram tracing (11/27/16): Indication: AMS Impression: 3DHB.  No MI.  LVH.  Long QT.  Compared to 11/26/16: no sig change  Cortisol=3 (1/18)    Assessment & Plan:  Hypocortisolemia: new to me, uncertain  etiology.  He should have testing, to see if he can reduce meds, given complex medical hx.  As he did hot tolerate discontinuation in the past, we first test him just hours after last dose, and interpret in that context.   Renal failure: this complicates the clinical eval of hypocortisolemia.   Patient Instructions  please stop taking the hydrocortisone pill after Monday morning's pill.   Please have the test here on Tuesday, 1 PM (blood test, shot, repeat blood test 45 minutes later).  We'll let you know about the results.

## 2017-09-19 DIAGNOSIS — D689 Coagulation defect, unspecified: Secondary | ICD-10-CM | POA: Diagnosis not present

## 2017-09-19 DIAGNOSIS — D631 Anemia in chronic kidney disease: Secondary | ICD-10-CM | POA: Diagnosis not present

## 2017-09-19 DIAGNOSIS — D509 Iron deficiency anemia, unspecified: Secondary | ICD-10-CM | POA: Diagnosis not present

## 2017-09-19 DIAGNOSIS — N2581 Secondary hyperparathyroidism of renal origin: Secondary | ICD-10-CM | POA: Diagnosis not present

## 2017-09-19 DIAGNOSIS — N186 End stage renal disease: Secondary | ICD-10-CM | POA: Diagnosis not present

## 2017-09-23 ENCOUNTER — Ambulatory Visit: Payer: Medicare Other

## 2017-09-23 DIAGNOSIS — D689 Coagulation defect, unspecified: Secondary | ICD-10-CM | POA: Diagnosis not present

## 2017-09-23 DIAGNOSIS — N2581 Secondary hyperparathyroidism of renal origin: Secondary | ICD-10-CM | POA: Diagnosis not present

## 2017-09-23 DIAGNOSIS — D631 Anemia in chronic kidney disease: Secondary | ICD-10-CM | POA: Diagnosis not present

## 2017-09-23 DIAGNOSIS — D509 Iron deficiency anemia, unspecified: Secondary | ICD-10-CM | POA: Diagnosis not present

## 2017-09-23 DIAGNOSIS — N186 End stage renal disease: Secondary | ICD-10-CM | POA: Diagnosis not present

## 2017-09-26 DIAGNOSIS — N2581 Secondary hyperparathyroidism of renal origin: Secondary | ICD-10-CM | POA: Diagnosis not present

## 2017-09-26 DIAGNOSIS — N186 End stage renal disease: Secondary | ICD-10-CM | POA: Diagnosis not present

## 2017-09-26 DIAGNOSIS — D689 Coagulation defect, unspecified: Secondary | ICD-10-CM | POA: Diagnosis not present

## 2017-09-26 DIAGNOSIS — D631 Anemia in chronic kidney disease: Secondary | ICD-10-CM | POA: Diagnosis not present

## 2017-09-26 DIAGNOSIS — D509 Iron deficiency anemia, unspecified: Secondary | ICD-10-CM | POA: Diagnosis not present

## 2017-09-29 DIAGNOSIS — D631 Anemia in chronic kidney disease: Secondary | ICD-10-CM | POA: Diagnosis not present

## 2017-09-29 DIAGNOSIS — N186 End stage renal disease: Secondary | ICD-10-CM | POA: Diagnosis not present

## 2017-09-29 DIAGNOSIS — D689 Coagulation defect, unspecified: Secondary | ICD-10-CM | POA: Diagnosis not present

## 2017-09-29 DIAGNOSIS — N2581 Secondary hyperparathyroidism of renal origin: Secondary | ICD-10-CM | POA: Diagnosis not present

## 2017-09-29 DIAGNOSIS — D509 Iron deficiency anemia, unspecified: Secondary | ICD-10-CM | POA: Diagnosis not present

## 2017-09-30 DIAGNOSIS — N186 End stage renal disease: Secondary | ICD-10-CM | POA: Diagnosis not present

## 2017-09-30 DIAGNOSIS — T8249XA Other complication of vascular dialysis catheter, initial encounter: Secondary | ICD-10-CM | POA: Diagnosis not present

## 2017-09-30 DIAGNOSIS — Z992 Dependence on renal dialysis: Secondary | ICD-10-CM | POA: Diagnosis not present

## 2017-10-01 DIAGNOSIS — N2581 Secondary hyperparathyroidism of renal origin: Secondary | ICD-10-CM | POA: Diagnosis not present

## 2017-10-01 DIAGNOSIS — D631 Anemia in chronic kidney disease: Secondary | ICD-10-CM | POA: Diagnosis not present

## 2017-10-01 DIAGNOSIS — D509 Iron deficiency anemia, unspecified: Secondary | ICD-10-CM | POA: Diagnosis not present

## 2017-10-01 DIAGNOSIS — D689 Coagulation defect, unspecified: Secondary | ICD-10-CM | POA: Diagnosis not present

## 2017-10-01 DIAGNOSIS — N186 End stage renal disease: Secondary | ICD-10-CM | POA: Diagnosis not present

## 2017-10-03 DIAGNOSIS — Z992 Dependence on renal dialysis: Secondary | ICD-10-CM | POA: Diagnosis not present

## 2017-10-03 DIAGNOSIS — D689 Coagulation defect, unspecified: Secondary | ICD-10-CM | POA: Diagnosis not present

## 2017-10-03 DIAGNOSIS — N186 End stage renal disease: Secondary | ICD-10-CM | POA: Diagnosis not present

## 2017-10-03 DIAGNOSIS — D631 Anemia in chronic kidney disease: Secondary | ICD-10-CM | POA: Diagnosis not present

## 2017-10-03 DIAGNOSIS — D509 Iron deficiency anemia, unspecified: Secondary | ICD-10-CM | POA: Diagnosis not present

## 2017-10-03 DIAGNOSIS — N2581 Secondary hyperparathyroidism of renal origin: Secondary | ICD-10-CM | POA: Diagnosis not present

## 2017-10-03 DIAGNOSIS — I129 Hypertensive chronic kidney disease with stage 1 through stage 4 chronic kidney disease, or unspecified chronic kidney disease: Secondary | ICD-10-CM | POA: Diagnosis not present

## 2017-10-06 DIAGNOSIS — D689 Coagulation defect, unspecified: Secondary | ICD-10-CM | POA: Diagnosis not present

## 2017-10-06 DIAGNOSIS — Z992 Dependence on renal dialysis: Secondary | ICD-10-CM | POA: Diagnosis not present

## 2017-10-06 DIAGNOSIS — N186 End stage renal disease: Secondary | ICD-10-CM | POA: Diagnosis not present

## 2017-10-06 DIAGNOSIS — N2581 Secondary hyperparathyroidism of renal origin: Secondary | ICD-10-CM | POA: Diagnosis not present

## 2017-10-06 DIAGNOSIS — D509 Iron deficiency anemia, unspecified: Secondary | ICD-10-CM | POA: Diagnosis not present

## 2017-10-06 DIAGNOSIS — D631 Anemia in chronic kidney disease: Secondary | ICD-10-CM | POA: Diagnosis not present

## 2017-10-08 DIAGNOSIS — Z992 Dependence on renal dialysis: Secondary | ICD-10-CM | POA: Insufficient documentation

## 2017-10-09 ENCOUNTER — Other Ambulatory Visit: Payer: Self-pay | Admitting: Internal Medicine

## 2017-10-09 DIAGNOSIS — E2740 Unspecified adrenocortical insufficiency: Secondary | ICD-10-CM

## 2017-10-10 ENCOUNTER — Telehealth: Payer: Self-pay | Admitting: Family Medicine

## 2017-10-10 DIAGNOSIS — D631 Anemia in chronic kidney disease: Secondary | ICD-10-CM | POA: Diagnosis not present

## 2017-10-10 DIAGNOSIS — N186 End stage renal disease: Secondary | ICD-10-CM | POA: Diagnosis not present

## 2017-10-10 DIAGNOSIS — D509 Iron deficiency anemia, unspecified: Secondary | ICD-10-CM | POA: Diagnosis not present

## 2017-10-10 DIAGNOSIS — D689 Coagulation defect, unspecified: Secondary | ICD-10-CM | POA: Diagnosis not present

## 2017-10-10 DIAGNOSIS — N2581 Secondary hyperparathyroidism of renal origin: Secondary | ICD-10-CM | POA: Diagnosis not present

## 2017-10-10 NOTE — Telephone Encounter (Signed)
I had referred him to [facility] and he no showed his appointment on 09/18/17.  Please inform him to reschedule as subsequent refills for hydrocortisone will have to come from endocrinology.

## 2017-10-13 DIAGNOSIS — D689 Coagulation defect, unspecified: Secondary | ICD-10-CM | POA: Diagnosis not present

## 2017-10-13 DIAGNOSIS — N2581 Secondary hyperparathyroidism of renal origin: Secondary | ICD-10-CM | POA: Diagnosis not present

## 2017-10-13 DIAGNOSIS — D509 Iron deficiency anemia, unspecified: Secondary | ICD-10-CM | POA: Diagnosis not present

## 2017-10-13 DIAGNOSIS — N186 End stage renal disease: Secondary | ICD-10-CM | POA: Diagnosis not present

## 2017-10-13 DIAGNOSIS — D631 Anemia in chronic kidney disease: Secondary | ICD-10-CM | POA: Diagnosis not present

## 2017-10-14 ENCOUNTER — Ambulatory Visit: Payer: Medicare Other | Admitting: Family Medicine

## 2017-10-17 DIAGNOSIS — N186 End stage renal disease: Secondary | ICD-10-CM | POA: Diagnosis not present

## 2017-10-17 DIAGNOSIS — N2581 Secondary hyperparathyroidism of renal origin: Secondary | ICD-10-CM | POA: Diagnosis not present

## 2017-10-17 DIAGNOSIS — D631 Anemia in chronic kidney disease: Secondary | ICD-10-CM | POA: Diagnosis not present

## 2017-10-17 DIAGNOSIS — D689 Coagulation defect, unspecified: Secondary | ICD-10-CM | POA: Diagnosis not present

## 2017-10-17 DIAGNOSIS — D509 Iron deficiency anemia, unspecified: Secondary | ICD-10-CM | POA: Diagnosis not present

## 2017-10-20 ENCOUNTER — Observation Stay (HOSPITAL_COMMUNITY)
Admission: EM | Admit: 2017-10-20 | Discharge: 2017-10-20 | Discharge status: Against Medical Advice | Payer: Medicare Other | Attending: Internal Medicine | Admitting: Internal Medicine

## 2017-10-20 ENCOUNTER — Encounter (HOSPITAL_COMMUNITY): Payer: Self-pay | Admitting: Emergency Medicine

## 2017-10-20 ENCOUNTER — Other Ambulatory Visit: Payer: Self-pay

## 2017-10-20 ENCOUNTER — Emergency Department (HOSPITAL_COMMUNITY): Payer: Medicare Other

## 2017-10-20 DIAGNOSIS — E2740 Unspecified adrenocortical insufficiency: Secondary | ICD-10-CM | POA: Diagnosis present

## 2017-10-20 DIAGNOSIS — Z794 Long term (current) use of insulin: Secondary | ICD-10-CM | POA: Insufficient documentation

## 2017-10-20 DIAGNOSIS — Z992 Dependence on renal dialysis: Secondary | ICD-10-CM

## 2017-10-20 DIAGNOSIS — Z8673 Personal history of transient ischemic attack (TIA), and cerebral infarction without residual deficits: Secondary | ICD-10-CM | POA: Insufficient documentation

## 2017-10-20 DIAGNOSIS — R531 Weakness: Secondary | ICD-10-CM | POA: Diagnosis not present

## 2017-10-20 DIAGNOSIS — E7800 Pure hypercholesterolemia, unspecified: Secondary | ICD-10-CM | POA: Diagnosis not present

## 2017-10-20 DIAGNOSIS — I120 Hypertensive chronic kidney disease with stage 5 chronic kidney disease or end stage renal disease: Secondary | ICD-10-CM | POA: Insufficient documentation

## 2017-10-20 DIAGNOSIS — D631 Anemia in chronic kidney disease: Secondary | ICD-10-CM | POA: Diagnosis not present

## 2017-10-20 DIAGNOSIS — Z7982 Long term (current) use of aspirin: Secondary | ICD-10-CM | POA: Insufficient documentation

## 2017-10-20 DIAGNOSIS — R001 Bradycardia, unspecified: Secondary | ICD-10-CM | POA: Insufficient documentation

## 2017-10-20 DIAGNOSIS — N186 End stage renal disease: Secondary | ICD-10-CM | POA: Diagnosis not present

## 2017-10-20 DIAGNOSIS — Z79899 Other long term (current) drug therapy: Secondary | ICD-10-CM | POA: Insufficient documentation

## 2017-10-20 DIAGNOSIS — E875 Hyperkalemia: Principal | ICD-10-CM | POA: Diagnosis present

## 2017-10-20 DIAGNOSIS — I10 Essential (primary) hypertension: Secondary | ICD-10-CM | POA: Diagnosis not present

## 2017-10-20 DIAGNOSIS — K219 Gastro-esophageal reflux disease without esophagitis: Secondary | ICD-10-CM | POA: Diagnosis not present

## 2017-10-20 LAB — CBC WITH DIFFERENTIAL/PLATELET
Basophils Absolute: 0 10*3/uL (ref 0.0–0.1)
Basophils Relative: 0 %
EOS ABS: 0.5 10*3/uL (ref 0.0–0.7)
Eosinophils Relative: 7 %
HCT: 31.2 % — ABNORMAL LOW (ref 39.0–52.0)
HEMOGLOBIN: 10.2 g/dL — AB (ref 13.0–17.0)
LYMPHS ABS: 1.2 10*3/uL (ref 0.7–4.0)
Lymphocytes Relative: 16 %
MCH: 29.7 pg (ref 26.0–34.0)
MCHC: 32.7 g/dL (ref 30.0–36.0)
MCV: 91 fL (ref 78.0–100.0)
Monocytes Absolute: 0.5 10*3/uL (ref 0.1–1.0)
Monocytes Relative: 6 %
NEUTROS ABS: 5.3 10*3/uL (ref 1.7–7.7)
NEUTROS PCT: 71 %
PLATELETS: 136 10*3/uL — AB (ref 150–400)
RBC: 3.43 MIL/uL — AB (ref 4.22–5.81)
RDW: 18.4 % — AB (ref 11.5–15.5)
WBC: 7.5 10*3/uL (ref 4.0–10.5)

## 2017-10-20 LAB — BASIC METABOLIC PANEL
ANION GAP: 14 (ref 5–15)
BUN: 87 mg/dL — ABNORMAL HIGH (ref 6–20)
CALCIUM: 7.9 mg/dL — AB (ref 8.9–10.3)
CO2: 19 mmol/L — AB (ref 22–32)
CREATININE: 16.39 mg/dL — AB (ref 0.61–1.24)
Chloride: 99 mmol/L — ABNORMAL LOW (ref 101–111)
GFR calc Af Amer: 4 mL/min — ABNORMAL LOW (ref >60)
GFR, EST NON AFRICAN AMERICAN: 3 mL/min — AB (ref >60)
GLUCOSE: 83 mg/dL (ref 65–99)
Potassium: >7.5 mmol/L (ref 3.5–5.1)
Sodium: 132 mmol/L — ABNORMAL LOW (ref 135–145)

## 2017-10-20 LAB — POCT I-STAT, CHEM 8
BUN: 20 mg/dL (ref 6–20)
CALCIUM ION: 1.13 mmol/L — AB (ref 1.15–1.40)
CREATININE: 4.6 mg/dL — AB (ref 0.61–1.24)
Chloride: 97 mmol/L — ABNORMAL LOW (ref 101–111)
Glucose, Bld: 66 mg/dL (ref 65–99)
HCT: 36 % — ABNORMAL LOW (ref 39.0–52.0)
Hemoglobin: 12.2 g/dL — ABNORMAL LOW (ref 13.0–17.0)
Potassium: 3.1 mmol/L — ABNORMAL LOW (ref 3.5–5.1)
SODIUM: 138 mmol/L (ref 135–145)
TCO2: 27 mmol/L (ref 22–32)

## 2017-10-20 LAB — CBG MONITORING, ED
GLUCOSE-CAPILLARY: 186 mg/dL — AB (ref 65–99)
GLUCOSE-CAPILLARY: 19 mg/dL — AB (ref 65–99)
GLUCOSE-CAPILLARY: 329 mg/dL — AB (ref 65–99)
Glucose-Capillary: 172 mg/dL — ABNORMAL HIGH (ref 65–99)

## 2017-10-20 LAB — I-STAT TROPONIN, ED: Troponin i, poc: 0.06 ng/mL (ref 0.00–0.08)

## 2017-10-20 LAB — PHOSPHORUS: Phosphorus: 7.3 mg/dL — ABNORMAL HIGH (ref 2.5–4.6)

## 2017-10-20 MED ORDER — INSULIN ASPART 100 UNIT/ML ~~LOC~~ SOLN
10.0000 [IU] | Freq: Once | SUBCUTANEOUS | Status: AC
  Administered 2017-10-20: 10 [IU] via INTRAVENOUS
  Filled 2017-10-20: qty 1

## 2017-10-20 MED ORDER — HYDROCORTISONE 5 MG PO TABS
5.0000 mg | ORAL_TABLET | Freq: Two times a day (BID) | ORAL | Status: DC
  Filled 2017-10-20 (×2): qty 1

## 2017-10-20 MED ORDER — HEPARIN SODIUM (PORCINE) 5000 UNIT/ML IJ SOLN
5000.0000 [IU] | Freq: Three times a day (TID) | INTRAMUSCULAR | Status: DC
  Filled 2017-10-20: qty 1

## 2017-10-20 MED ORDER — ONDANSETRON HCL 4 MG PO TABS: 4.0000 mg | ORAL_TABLET | Freq: Four times a day (QID) | ORAL | Status: DC | PRN

## 2017-10-20 MED ORDER — ACETAMINOPHEN 325 MG PO TABS: 650.0000 mg | ORAL_TABLET | Freq: Four times a day (QID) | ORAL | Status: DC | PRN

## 2017-10-20 MED ORDER — NALOXONE HCL 4 MG/0.1ML NA LIQD: 1.0000 | Freq: Once | NASAL | Status: DC

## 2017-10-20 MED ORDER — SODIUM BICARBONATE 8.4 % IV SOLN
50.0000 meq | Freq: Once | INTRAVENOUS | Status: AC
  Administered 2017-10-20: 50 meq via INTRAVENOUS
  Filled 2017-10-20: qty 50

## 2017-10-20 MED ORDER — HYDRALAZINE HCL 50 MG PO TABS
100.0000 mg | ORAL_TABLET | Freq: Three times a day (TID) | ORAL | Status: DC
  Administered 2017-10-20: 100 mg via ORAL
  Filled 2017-10-20: qty 2

## 2017-10-20 MED ORDER — DEXTROSE 50 % IV SOLN
1.0000 | Freq: Once | INTRAVENOUS | Status: AC
  Administered 2017-10-20: 50 mL via INTRAVENOUS
  Filled 2017-10-20: qty 50

## 2017-10-20 MED ORDER — DEXTROSE 50 % IV SOLN
INTRAVENOUS | Status: AC
  Administered 2017-10-20: 50 mL
  Filled 2017-10-20: qty 50

## 2017-10-20 MED ORDER — PANTOPRAZOLE SODIUM 40 MG PO TBEC
80.0000 mg | DELAYED_RELEASE_TABLET | Freq: Every day | ORAL | Status: DC
  Administered 2017-10-20: 80 mg via ORAL
  Filled 2017-10-20: qty 2

## 2017-10-20 MED ORDER — NALOXONE HCL 2 MG/2ML IJ SOSY
PREFILLED_SYRINGE | INTRAMUSCULAR | Status: AC
  Filled 2017-10-20: qty 2

## 2017-10-20 MED ORDER — DEXTROSE 10 % IV SOLN: INTRAVENOUS | Status: DC

## 2017-10-20 MED ORDER — LACTULOSE 10 GM/15ML PO SOLN: 20.0000 g | Freq: Every day | ORAL | Status: DC | PRN

## 2017-10-20 MED ORDER — ASPIRIN EC 81 MG PO TBEC
81.0000 mg | DELAYED_RELEASE_TABLET | Freq: Every day | ORAL | Status: DC
  Administered 2017-10-20: 81 mg via ORAL
  Filled 2017-10-20: qty 1

## 2017-10-20 MED ORDER — ALBUTEROL SULFATE (2.5 MG/3ML) 0.083% IN NEBU
10.0000 mg | INHALATION_SOLUTION | Freq: Once | RESPIRATORY_TRACT | Status: AC
  Administered 2017-10-20: 10 mg via RESPIRATORY_TRACT
  Filled 2017-10-20: qty 12

## 2017-10-20 MED ORDER — ONDANSETRON HCL 4 MG/2ML IJ SOLN: 4.0000 mg | Freq: Four times a day (QID) | INTRAMUSCULAR | Status: DC | PRN

## 2017-10-20 MED ORDER — ATORVASTATIN CALCIUM 40 MG PO TABS: 40.0000 mg | ORAL_TABLET | Freq: Every day | ORAL | Status: DC

## 2017-10-20 MED ORDER — ACETAMINOPHEN 650 MG RE SUPP: 650.0000 mg | Freq: Four times a day (QID) | RECTAL | Status: DC | PRN

## 2017-10-20 MED ORDER — CARVEDILOL 25 MG PO TABS
25.0000 mg | ORAL_TABLET | Freq: Two times a day (BID) | ORAL | Status: DC
  Administered 2017-10-20: 25 mg via ORAL
  Filled 2017-10-20: qty 1

## 2017-10-20 MED ORDER — SODIUM CHLORIDE 0.9 % IV SOLN
1.0000 g | Freq: Once | INTRAVENOUS | Status: AC
  Administered 2017-10-20: 1 g via INTRAVENOUS
  Filled 2017-10-20: qty 10

## 2017-10-20 NOTE — Progress Notes (Signed)
Pt signed paperwork to leave AMA. MD made aware. Pt educated on risks. Does have HD scheduled for Wednesdays. IV removed. Telemetry removed, CCMD notified.   Fritz Pickerel, RN

## 2017-10-20 NOTE — ED Notes (Addendum)
Critical potassium reported to Domenic Moras

## 2017-10-20 NOTE — ED Notes (Signed)
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

## 2017-10-20 NOTE — H&P (Signed)
History and Physical    Tedford Berg KGM:[id number] DOB: November 21, 1970 DOA: 10/20/2017  PCP: Arnoldo Morale, MD  Patient coming from: Home  I have personally briefly reviewed patient's old medical records in [place]  Chief Complaint: Generalized weakness  HPI: Marvin Roberson is a 46 y.o. male with medical history significant of ESRD dialysis MWF.  Felt like something was wrong with dialysis on F.  One of the settings that was normally "350" had to be run at "200" due to his catheter not drawing as much he thinks.  Patient presents to ED today with c/o generalized muscle weakness and not feeling well.  He reports having metallic taste in mouth, and suspects his potassium might be high.   ED Course: K > 7.5.  EKG changes including tall peaked T waves, and just before being given an amp of bicarb he bradied down into the 20s-30s.  Review of Systems: As per HPI otherwise 10 point review of systems negative.   Past Medical History:  Diagnosis Date  . Adrenal insufficiency ([place])   . Dyspnea    "when I have too much fluid"  . ESRD on dialysis [facility]) since 1990s   "TTS; [address]" (11/27/2016)  . GERD (gastroesophageal reflux disease)   . High cholesterol   . History of blood transfusion   . Hypertension   . Kidney failure   . Renal insufficiency   . Stroke [facility]) 2016   decreased vision in his left eye/notes 11/27/2016    Past Surgical History:  Procedure Laterality Date  . ANGIOPLASTY Left 12/11/2016   Procedure: ANGIOPLASTY LEFT ARM & SUBCLAVIAN ARTERY;  Surgeon: Conrad [place], MD;  Location: [place];  Service: Vascular;  Laterality: Left;  . arm surgery Left 2016   "for aneurysm"  . AV FISTULA PLACEMENT    . FALSE ANEURYSM REPAIR Left 12/11/2016   Procedure: RESECTION  BRACHIAL ARTERY;  Surgeon: Conrad [place], MD;  Location: [place];  Service: Vascular;  Laterality: Left;  . INSERTION OF DIALYSIS CATHETER Right    chest  . THROMBECTOMY BRACHIAL ARTERY  12/11/2016   Procedure:  THROMBECTOMY BRACHIAL ARTERY AND ULNAR;  Surgeon: Conrad [place], MD;  Location: [place];  Service: Vascular;;  . THYROIDECTOMY    . UPPER EXTREMITY ANGIOGRAM Left 12/11/2016   Procedure: LEFT ARM ANGIOGRAM;  Surgeon: Conrad [place], MD;  Location: [place];  Service: Vascular;  Laterality: Left;  . WOUND EXPLORATION Left 12/11/2016   Procedure: LEFT ARM BRACHIAL ARTERY WITH INTERPOSTIONAL GRAFT;  Surgeon: Conrad [place], MD;  Location: [place];  Service: Vascular;  Laterality: Left;     reports that he has been smoking cigarettes.  He has a 2.40 pack-year smoking history. he has never used smokeless tobacco. He reports that he drinks alcohol. He reports that he does not use drugs.  Allergies  Allergen Reactions  . Doxycycline Shortness Of Breath  . Codeine Nausea And Vomiting  . Gabapentin Other (See Comments)    Sleepiness, altered mental state  . Lisinopril Other (See Comments) and Swelling    angioedema  . Povidone-Iodine Itching  . Betadine [Povidone Iodine] Rash    Family History  Problem Relation Age of Onset  . Hypertension Other   . Adrenal disorder Neg Hx      Prior to Admission medications   Medication Sig Start Date End Date Taking? Authorizing Provider  aspirin EC 81 MG tablet Take 81 mg by mouth daily.   Yes [provider]  atorvastatin (LIPITOR) 40 MG tablet Take 40 mg by mouth daily.   Yes [provider]  carvedilol (COREG) 25 MG tablet Take 25 mg by mouth 2 (two) times daily with a meal.   Yes [provider]  hydrALAZINE (APRESOLINE) 100 MG tablet Take 100 mg by mouth 3 (three) times daily.   Yes [provider]  hydrocortisone (CORTEF) 5 MG tablet TAKE 1 TABLET BY MOUTH TWICE A DAY Patient taking differently: TAKE 1 TABLET (5 MG) BY MOUTH TWICE A DAY 10/09/17  Yes Amao, Charlane Ferretti, MD  lactulose (CEPHULAC) 20 g packet Take 1 packet (20 g total) by mouth daily as needed (constipation). 05/26/17  Yes Donne Hazel, MD  losartan (COZAAR) 100 MG  tablet Take 100 mg by mouth daily.   Yes [provider]  omeprazole (PRILOSEC) 40 MG capsule Take 40 mg by mouth daily.   Yes [provider]    Physical Exam: Vitals:   10/20/17 0207 10/20/17 0315 10/20/17 0417 10/20/17 0429  BP: (!) 191/135  (!) 153/91   Pulse: 76 65 (!) 105   Resp: 16 13 14    Temp: 97.8 F (36.6 C)     TempSrc: Oral     SpO2: 100% 99% 97% 99%  Weight: 63.8 kg (140 lb 10.5 oz)     Height: 5\' 7"  (1.702 m)       Constitutional: NAD, calm, comfortable Eyes: PERRL, lids and conjunctivae normal ENMT: Mucous membranes are moist. Posterior pharynx clear of any exudate or lesions.Normal dentition.  Neck: normal, supple, no masses, no thyromegaly Respiratory: clear to auscultation bilaterally, no wheezing, no crackles. Normal respiratory effort. No accessory muscle use.  Cardiovascular: Regular rate and rhythm, no murmurs / rubs / gallops. No extremity edema. 2+ pedal pulses. No carotid bruits.  Abdomen: no tenderness, no masses palpated. No hepatosplenomegaly. Bowel sounds positive.  Musculoskeletal: no clubbing / cyanosis. No joint deformity upper and lower extremities. Good ROM, no contractures. Normal muscle tone.  Skin: no rashes, lesions, ulcers. No induration Neurologic: CN 2-12 grossly intact. Sensation intact, DTR normal. Strength 5/5 in all 4.  Psychiatric: Normal judgment and insight. Alert and oriented x 3. Normal mood.    Labs on Admission: I have personally reviewed following labs and imaging studies  CBC: Recent Labs  Lab 10/20/17 0259  WBC 7.5  NEUTROABS 5.3  HGB 10.2*  HCT 31.2*  MCV 91.0  PLT 510*   Basic Metabolic Panel: Recent Labs  Lab 10/20/17 0259  NA 132*  K >7.5*  CL 99*  CO2 19*  GLUCOSE 83  BUN 87*  CREATININE 16.39*  CALCIUM 7.9*  PHOS 7.3*   GFR: Estimated Creatinine Clearance: 5.1 mL/min (A) (by C-G formula based on SCr of 16.39 mg/dL (H)). Liver Function Tests: No results for input(s): AST, ALT,  ALKPHOS, BILITOT, PROT, ALBUMIN in the last 168 hours. No results for input(s): LIPASE, AMYLASE in the last 168 hours. No results for input(s): AMMONIA in the last 168 hours. Coagulation Profile: No results for input(s): INR, PROTIME in the last 168 hours. Cardiac Enzymes: No results for input(s): CKTOTAL, CKMB, CKMBINDEX, TROPONINI in the last 168 hours. BNP (last 3 results) No results for input(s): PROBNP in the last 8760 hours. HbA1C: No results for input(s): HGBA1C in the last 72 hours. CBG: No results for input(s): GLUCAP in the last 168 hours. Lipid Profile: No results for input(s): CHOL, HDL, LDLCALC, TRIG, CHOLHDL, LDLDIRECT in the last 72 hours. Thyroid Function Tests: No results for input(s):  TSH, T4TOTAL, FREET4, T3FREE, THYROIDAB in the last 72 hours. Anemia Panel: No results for input(s): VITAMINB12, FOLATE, FERRITIN, TIBC, IRON, RETICCTPCT in the last 72 hours. Urine analysis:    Component Value Date/Time   COLORURINE STRAW (A) 05/24/2017 2321   APPEARANCEUR CLEAR 05/24/2017 2321   LABSPEC 1.005 05/24/2017 2321   PHURINE 8.0 05/24/2017 2321   GLUCOSEU 50 (A) 05/24/2017 2321   HGBUR NEGATIVE 05/24/2017 2321   BILIRUBINUR NEGATIVE 05/24/2017 2321   KETONESUR NEGATIVE 05/24/2017 2321   PROTEINUR 30 (A) 05/24/2017 2321   NITRITE NEGATIVE 05/24/2017 2321   LEUKOCYTESUR NEGATIVE 05/24/2017 2321    Radiological Exams on Admission: Dg Chest 2 View  Result Date: 10/20/2017 CLINICAL DATA:  Weakness.  Dialysis patient. EXAM: CHEST  2 VIEW COMPARISON:  Most recent radiograph 05/24/2017 FINDINGS: Right dialysis catheter remains in place, tip in the SVC. Left axillary stent. Mild cardiomegaly is similar. Mild bronchovascular crowding secondary to low lung volumes. No frank pulmonary edema. No focal airspace disease or large pleural effusion. No pneumothorax. Stable osseous structures. IMPRESSION: Low lung volumes with bronchovascular crowding. No frank pulmonary edema. Mild  cardiomegaly. Electronically Signed   By: Jeb Levering M.D.   On: 10/20/2017 03:38    EKG: Independently reviewed.  Assessment/Plan Principal Problem:   Hyperkalemia, diminished renal excretion Active Problems:   Hypertension   Adrenal insufficiency (H[facility])   ESRD on hemodialysis ([place])    1. Hyperkalemia - with EKG changes, and episode of bradycardia 1. Getting calcium, bicarb, insulin, glucose, albuterol. 2. Nephro called for emergent dialysis.  Dr. Jonnie Finner says they can get him in at 0630 (1h 58m from time I write this) 3. Tele monitor 2. HTN - continue home BP meds, but will hold off on his losartan for now given hyperkalemia 3. Adrenal insufficiency - continue Cortef BID  DVT prophylaxis: Heparin [place] Code Status: Full Family Communication: No family in room Disposition Plan: Home after admit Consults called: Nephrology Admission status: Place in [place], [place] Hospitalists Pager [phone]  If 7AM-7PM, please contact day team taking care of patient [email or web address] Password [facility]  10/20/2017, 4:37 AM

## 2017-10-20 NOTE — ED Provider Notes (Signed)
[facility] EMERGENCY DEPARTMENT Provider Note   CSN: [id number] Arrival date & time: 10/20/17  0156     History   Chief Complaint Chief Complaint  Patient presents with  . Weakness    HPI Marvin Roberson is a 46 y.o. male.  HPI   46 year old male with history of end-stage renal disease currently on Monday Wednesday Friday dialysis, hypertension, hypercholesterolemia history of presenting complaining of not feeling well.  Patient states his last dialysis was on Friday but after the procedure he felt that he did not receive enough treatment.  Since then he has not been feeling well, states he feeling dizzy, fatigued, with occasional nausea and diarrhea.  Furthermore he also report having some metallic taste in his mouth and felt that his potassium and phosphorus level may be elevated since he has similar taste like this in the past when his level was high.  He denies any recent sickness.  Denies any productive cough.  No fever or chills.  Past Medical History:  Diagnosis Date  . Adrenal insufficiency ([place])   . Dyspnea    "when I have too much fluid"  . ESRD on dialysis [facility]) since 1990s   "TTS; [address]" (11/27/2016)  . GERD (gastroesophageal reflux disease)   . High cholesterol   . History of blood transfusion   . Hypertension   . Kidney failure   . Renal insufficiency   . Stroke [facility]) 2016   decreased vision in his left eye/notes 11/27/2016    Patient Active Problem List   Diagnosis Date Noted  . Post-operative complication 51/76/1607  . 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[place])   . ESRD on hemodialysis ([place])   . 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 Adrenal insufficiency ([place])   . Hypertension 11/27/2016  . History of CVA (cerebrovascular accident) 11/27/2016  . Monoclonal gammopathy of unknown significance ([facility]) 11/27/2016    Past Surgical History:  Procedure Laterality Date  . ANGIOPLASTY Left 12/11/2016   Procedure: ANGIOPLASTY LEFT ARM &  SUBCLAVIAN ARTERY;  Surgeon: Conrad Ross, MD;  Location: [place];  Service: Vascular;  Laterality: Left;  . arm surgery Left 2016   "for aneurysm"  . AV FISTULA PLACEMENT    . FALSE ANEURYSM REPAIR Left 12/11/2016   Procedure: RESECTION  BRACHIAL ARTERY;  Surgeon: Conrad [place], MD;  Location: [place];  Service: Vascular;  Laterality: Left;  . INSERTION OF DIALYSIS CATHETER Right    chest  . THROMBECTOMY BRACHIAL ARTERY  12/11/2016   Procedure: THROMBECTOMY BRACHIAL ARTERY AND ULNAR;  Surgeon: Conrad Gonvick, MD;  Location: [place];  Service: Vascular;;  . THYROIDECTOMY    . UPPER EXTREMITY ANGIOGRAM Left 12/11/2016   Procedure: LEFT ARM ANGIOGRAM;  Surgeon: Conrad [place]kinson, MD;  Location: [place];  Service: Vascular;  Laterality: Left;  . WOUND EXPLORATION Left 12/11/2016   Procedure: LEFT ARM BRACHIAL ARTERY WITH INTERPOSTIONAL GRAFT;  Surgeon: Conrad Dougherty, MD;  Location: [place];  Service: Vascular;  Laterality: Left;       Home Medications    Prior to Admission medications   Medication Sig Start Date End Date Taking? Authorizing Provider  aspirin EC 81 MG tablet Take 81 mg by mouth daily.    [provider]  atorvastatin (LIPITOR) 40 MG tablet Take 40 mg by mouth daily.    [provider]  carvedilol (COREG) 25 MG tablet Take 25 mg by mouth 2 (two) times daily with a meal.    [provider]  hydrALAZINE (APRESOLINE) 100 MG tablet Take 100 mg by mouth 3 (three) times daily.    [provider]  HYDROcodone-acetaminophen (NORCO/VICODIN) 5-325 MG tablet Take 1-2 tablets by mouth every 6 (six) hours as needed. Patient not taking: Reported on 07/09/2017 03/09/17   Ward, Delice Bison, DO  hydrocortisone (CORTEF) 5 MG tablet TAKE 1 TABLET BY MOUTH TWICE A DAY 10/09/17   Arnoldo Morale, MD  lactulose (CEPHULAC) 20 g packet Take 1 packet (20 g total) by mouth daily as needed (constipation). 05/26/17   Donne Hazel, MD  losartan (COZAAR) 100 MG tablet Take 100 mg by mouth daily.     [provider]  omeprazole (PRILOSEC) 40 MG capsule Take 40 mg by mouth daily.    [provider]    Family History Family History  Problem Relation Age of Onset  . Hypertension Other   . Adrenal disorder Neg Hx     Social History Social History   Tobacco Use  . Smoking status: Current Every Day Smoker    Packs/day: 0.12    Years: 20.00    Pack years: 2.40    Types: Cigarettes  . Smokeless tobacco: Never Used  . Tobacco comment: Less than 1/2 pk per day  Substance Use Topics  . Alcohol use: Yes    Comment: occ  . Drug use: No     Allergies   Codeine; Gabapentin; Lisinopril; Povidone-iodine; and Betadine [povidone iodine]   Review of Systems Review of Systems  All other systems reviewed and are negative.    Physical Exam Updated Vital Signs BP (!) 191/135 (BP Location: Right Leg)   Pulse 76   Temp 97.8 F (36.6 C) (Oral)   Resp 16   Ht 5\' 7"  (1.702 m)   Wt 63.8 kg (140 lb 10.5 oz)   SpO2 100%   BMI 22.03 kg/m   Physical Exam  Constitutional: He is oriented to person, place, and time. He appears well-developed and well-nourished. No distress.  Resting in bed comfortably in no acute distress, playing on his phone.  HENT:  Head: Atraumatic.  Eyes: Conjunctivae are normal.  Neck: Neck supple.  Cardiovascular: Normal rate and regular rhythm.  Pulmonary/Chest: Effort normal and breath sounds normal.  Abdominal: Soft. He exhibits no distension. There is no tenderness.  Musculoskeletal:  5 out of 5 strength all 4 extremities.  Neurological: He is alert and oriented to person, place, and time.  Skin: No rash noted.  Psychiatric: He has a normal mood and affect.  Nursing note and vitals reviewed.    ED Treatments / Results  Labs (all labs ordered are listed, but only abnormal results are displayed) Labs Reviewed  CBC WITH DIFFERENTIAL/PLATELET - Abnormal; Notable for the following components:      Result Value   RBC 3.43 (*)     Hemoglobin 10.2 (*)    HCT 31.2 (*)    RDW 18.4 (*)    Platelets 136 (*)    All other components within normal limits  BASIC METABOLIC PANEL - Abnormal; Notable for the following components:   Sodium 132 (*)    Potassium >7.5 (*)    Chloride 99 (*)    CO2 19 (*)    BUN 87 (*)    Creatinine, Ser 16.39 (*)    Calcium 7.9 (*)    GFR calc non Af Amer 3 (*)    GFR calc Af Amer 4 (*)    All other components within normal limits  PHOSPHORUS -  Abnormal; Notable for the following components:   Phosphorus 7.3 (*)    All other components within normal limits  GLUCOSE, RANDOM  I-STAT TROPONIN, ED    EKG  EKG Interpretation  Date/Time:  Monday October 20 2017 02:43:48 EST Ventricular Rate:  72 PR Interval:    QRS Duration: 119 QT Interval:  439 QTC Calculation: 481 R Axis:   -79 Text Interpretation:  Sinus rhythm Prolonged PR interval Left anterior fascicular block Probable LVH with secondary repol abnrm  ST depression v6  peaked T waves Confirmed by Ezequiel Essex [phone]) on 10/20/2017 3:42:40 AM       Radiology Dg Chest 2 View  Result Date: 10/20/2017 CLINICAL DATA:  Weakness.  Dialysis patient. EXAM: CHEST  2 VIEW COMPARISON:  Most recent radiograph 05/24/2017 FINDINGS: Right dialysis catheter remains in place, tip in the SVC. Left axillary stent. Mild cardiomegaly is similar. Mild bronchovascular crowding secondary to low lung volumes. No frank pulmonary edema. No focal airspace disease or large pleural effusion. No pneumothorax. Stable osseous structures. IMPRESSION: Low lung volumes with bronchovascular crowding. No frank pulmonary edema. Mild cardiomegaly. Electronically Signed   By: Jeb Levering M.D.   On: 10/20/2017 03:38    Procedures Procedures (including critical care time)  Medications Ordered in ED Medications  calcium chloride 1 g in sodium chloride 0.9 % 100 mL IVPB (not administered)  albuterol (PROVENTIL) (2.5 MG/3ML) 0.083% nebulizer solution 10 mg (not  administered)  insulin aspart (novoLOG) injection 10 Units (not administered)  dextrose 50 % solution 50 mL (not administered)  sodium bicarbonate injection 50 mEq (not administered)     Initial Impression / Assessment and Plan / ED Course  I have reviewed the triage vital signs and the nursing notes.  Pertinent labs & imaging results that were available during my care of the patient were reviewed by me and considered in my medical decision making (see chart for details).     BP (!) 191/135 (BP Location: Right Leg)   Pulse 65   Temp 97.8 F (36.6 C) (Oral)   Resp 13   Ht 5\' 7"  (1.702 m)   Wt 63.8 kg (140 lb 10.5 oz)   SpO2 99%   BMI 22.03 kg/m    Final Clinical Impressions(s) / ED Diagnoses   Final diagnoses:  Generalized weakness  Hyperkalemia  Dialysis patient [facility])    ED Discharge Orders    None     3:06 AM This is a patient who is on Monday Wednesday Friday dialysis presenting due to not feeling well and felt that he may not be fully dialyzed after his last session.  He appears to be in no acute discomfort.  He is found to be hypertensive with a blood pressure 191/135.  Will have it recheck.  3:45 AM EKG with evidence of peaked T waves, concerning for hyperkalemia.  Potassium is greater than 7.5.  Elevated phosphorus level of 7.3.  Patient will need emergent treatment of his hyperkalemia.  Patient given calcium chloride, albuterol, insulin, dextrose, and sodium bicarb.  I have consulted with on-call nephrologist, Dr. Jonnie Finner, who will plan to dialyze patient at 6:30 AM.  He requests medicine for admission.  Care discussed with Dr. Wyvonnia Dusky.  Pt made aware of plan and agrees with plan.  4:11 AM Appreciate consultation from [facility] Dr. Alcario Drought who agrees to see pt in the ER and will admit for further management.   5:53 AM After receiving treatment for hyperkalemia including 10 units of insulin along with  dextrose 10% infusion, pt became diaphoretic and  unresponsive.  CBG of 19, pt received 2 amps of D50 and became more awake.  Dr. Melvia Heaps currently at bedside.    CRITICAL CARE Performed by: Domenic Moras Total critical care time: 40 minutes Critical care time was exclusive of separately billable procedures and treating other patients. Critical care was necessary to treat or prevent imminent or life-threatening deterioration. Critical care was time spent personally by me on the following activities: development of treatment plan with patient and/or surrogate as well as nursing, discussions with consultants, evaluation of patient's response to treatment, examination of patient, obtaining history from patient or surrogate, ordering and performing treatments and interventions, ordering and review of laboratory studies, ordering and review of radiographic studies, pulse oximetry and re-evaluation of patient's condition.       Domenic Moras, PA-C 10/20/17 6761    Ezequiel Essex, MD 10/20/17 [phone]

## 2017-10-20 NOTE — ED Triage Notes (Addendum)
Pt is HD patient, MWF, pt states he feels he did not get enough pulled off, stating permacath to R chest was not working properly. Pt states last night he stated feeling unwell, fatigued, dizziness, minor shob, minor nausea, diarrhea for several days. Pt states he has a metallic taste in his mouth, pt states in the past when this occurred it was d/t high potassium or phosphorus levels.  BP taken in R ankle.

## 2017-10-20 NOTE — Consult Note (Signed)
Renal Service Consult Note [facility]  Marvin Roberson 10/20/2017 [name] Requesting Physician: Dr Wyvonnia Dusky  Reason for Consult:  ESRD pt with hyperkalemia HPI: The patient is a 46 y.o. year-old with hx of HTN, CVA, CKD on HD who presented to ED this evening with gen'd muscle aches and malaise, low energy.  Had his full dialysis on Friday.  No SOB , CP or nv/d.  In ED K was > 7.5 and EKG showed NSR with peaked T waves.  Just prior to receiving bicarb IV he had bradycardia episode in the ED which resolved w/o incident.  Asked to see for dialysis.   Patient has not c/o's and denies any recent CP, SOB, f/c/s, n /v/d.     Outpt records show that patient has missed 2 HD sessions in the last 4 weeks  ROS  denies CP  no joint pain   no HA  no blurry vision  no rash  no diarrhea  no nausea/ vomiting    Past Medical History  Past Medical History:  Diagnosis Date  . Adrenal insufficiency ([place])   . Dyspnea    "when I have too much fluid"  . ESRD on dialysis [facility]) since 1990s   "TTS; [address]" (11/27/2016)  . GERD (gastroesophageal reflux disease)   . High cholesterol   . History of blood transfusion   . Hypertension   . Kidney failure   . Renal insufficiency   . Stroke [facility]) 2016   decreased vision in his left eye/notes 11/27/2016   Past Surgical History  Past Surgical History:  Procedure Laterality Date  . ANGIOPLASTY Left 12/11/2016   Procedure: ANGIOPLASTY LEFT ARM & SUBCLAVIAN ARTERY;  Surgeon: Conrad Bright, MD;  Location: [place];  Service: Vascular;  Laterality: Left;  . arm surgery Left 2016   "for aneurysm"  . AV FISTULA PLACEMENT    . FALSE ANEURYSM REPAIR Left 12/11/2016   Procedure: RESECTION  BRACHIAL ARTERY;  Surgeon: Conrad [place], MD;  Location: [place];  Service: Vascular;  Laterality: Left;  . INSERTION OF DIALYSIS CATHETER Right    chest  . THROMBECTOMY BRACHIAL ARTERY  12/11/2016   Procedure: THROMBECTOMY BRACHIAL ARTERY AND ULNAR;   Surgeon: Conrad Pickens, MD;  Location: [place];  Service: Vascular;;  . THYROIDECTOMY    . UPPER EXTREMITY ANGIOGRAM Left 12/11/2016   Procedure: LEFT ARM ANGIOGRAM;  Surgeon: Conrad [place], MD;  Location: [place];  Service: Vascular;  Laterality: Left;  . WOUND EXPLORATION Left 12/11/2016   Procedure: LEFT ARM BRACHIAL ARTERY WITH INTERPOSTIONAL GRAFT;  Surgeon: Conrad Kenwood, MD;  Location: [facility] OR;  Service: Vascular;  Laterality: Left;   Family History  Family History  Problem Relation Age of Onset  . Hypertension Other   . Adrenal disorder Neg Hx    Social History  reports that he has been smoking cigarettes.  He has a 2.40 pack-year smoking history. he has never used smokeless tobacco. He reports that he drinks alcohol. He reports that he does not use drugs. Allergies  Allergies  Allergen Reactions  . Doxycycline Shortness Of Breath  . Codeine Nausea And Vomiting  . Gabapentin Other (See Comments)    Sleepiness, altered mental state  . Lisinopril Other (See Comments) and Swelling    angioedema  . Povidone-Iodine Itching  . Betadine [Povidone Iodine] Rash   Home medications Prior to Admission medications   Medication Sig Start Date End Date Taking? Authorizing Provider  aspirin EC 81  MG tablet Take 81 mg by mouth daily.   Yes [provider]  atorvastatin (LIPITOR) 40 MG tablet Take 40 mg by mouth daily.   Yes [provider]  carvedilol (COREG) 25 MG tablet Take 25 mg by mouth 2 (two) times daily with a meal.   Yes [provider]  hydrALAZINE (APRESOLINE) 100 MG tablet Take 100 mg by mouth 3 (three) times daily.   Yes [provider]  hydrocortisone (CORTEF) 5 MG tablet TAKE 1 TABLET BY MOUTH TWICE A DAY Patient taking differently: TAKE 1 TABLET (5 MG) BY MOUTH TWICE A DAY 10/09/17  Yes Amao, Charlane Ferretti, MD  lactulose (CEPHULAC) 20 g packet Take 1 packet (20 g total) by mouth daily as needed (constipation). 05/26/17  Yes Donne Hazel, MD  losartan  (COZAAR) 100 MG tablet Take 100 mg by mouth daily.   Yes [provider]  omeprazole (PRILOSEC) 40 MG capsule Take 40 mg by mouth daily.   Yes [provider]   Liver Function Tests No results for input(s): AST, ALT, ALKPHOS, BILITOT, PROT, ALBUMIN in the last 168 hours. No results for input(s): LIPASE, AMYLASE in the last 168 hours. CBC Recent Labs  Lab 10/20/17 0259  WBC 7.5  NEUTROABS 5.3  HGB 10.2*  HCT 31.2*  MCV 91.0  PLT 782*   Basic Metabolic Panel Recent Labs  Lab 10/20/17 0259  NA 132*  K >7.5*  CL 99*  CO2 19*  GLUCOSE 83  BUN 87*  CREATININE 16.39*  CALCIUM 7.9*  PHOS 7.3*   Iron/TIBC/Ferritin/ %Sat No results found for: IRON, TIBC, FERRITIN, IRONPCTSAT  Vitals:   10/20/17 0429 10/20/17 0445 10/20/17 0500 10/20/17 0536  BP:  (!) 152/91 (!) 145/102 (!) 189/104  Pulse:  68 65 80  Resp:  19 17 (!) 26  Temp:    (!) 97.4 F (36.3 C)  TempSrc:    Oral  SpO2: 99% 100% 100% 96%  Weight:      Height:       Exam Gen thin AAM no distress No rash, cyanosis or gangrene Sclera anicteric, throat clear  No jvd or bruits  Chest clear bilat RRR no MRG Abd soft ntnd no mass or ascites +bs GU normal male MS no joint effusions or deformity Ext 1+ LE edema / no wounds or ulcers Neuro is alert, Ox 3 , nf    Dialysis: MWF  3.5h   63kg  2/2.5 bath  R IJ cath   Hep 2000 x 2 -hect 2 ug -venofer 50 /wk -mircera 150 , last was 100 ug on 11/28 (150 ug was due 12/12 but pt missed HD that day)    Impression: 1. Severe hyperkalemia / fatigue / bradycardia - improving w chemical Rx in ED.   2. ESRD on HD MWF 3. Volume - mild excess on exam, CXR clear 4. Anemia of CKD  5. HTN - BP's up a bit 6. Hx CVA    Plan - HD today upstairs this am  Kelly Splinter MD [employer] pager [phone]   10/20/2017, 6:06 AM

## 2017-10-20 NOTE — Progress Notes (Signed)
Pt received from HD. Oriented to room and equipment. Pt very upset about being admitted. Requesting to speak to MD and be discharged. Noted BP elevated likely r/t bolus in HD, agitation, and no meds given today. Will give daily meds and page Dr. Thereasa Solo. Telemetry applied, CCMD notified. CHG completed.   Fritz Pickerel, RN

## 2017-10-21 NOTE — Discharge Summary (Signed)
   PT LEFT AMA SUMMARY  Marvin Roberson MRN - [id number] DOB - Mar 17, 1971  Date of Admission - 10/20/2017 Date LEFT AMA: 10/20/2017  Attending Physician:  Joette Catching T  Patient's PCP:  Arnoldo Morale, MD  Disposition: LEFT AMA  Follow-up Appts:  Not able to be arranged or discussed as pt LEFT AMA  Diagnoses at time pt LEFT AMA: Severe hyperkalemia / fatigue / bradycardia  ESRD on HD MWF Anemia of CKD  HTN Hx CVA  Initial presentation: 46 y.o. male with medical history significant of ESRD dialysis MWF.   Patient presented to ED with c/o generalized muscle weakness and not feeling well.  He reports having metallic taste in mouth, and suspects his potassium might be high.  Hospital Course: Listed below are the active problems present, and the status of the care of these problems, at the time the pt decided to [place]: 1. Severe hyperkalemia / fatigue / bradycardia - improving w chemical Rx in ED.   2. ESRD on HD MWF 3. Volume - mild excess on exam, CXR clear 4. Anemia of CKD  5. HTN - BP's up a bit 6. Hx CVA     Medication List    Unable to be finalized as pt LEFT AMA  Day of Discharge Wt Readings from Last 3 Encounters:  10/20/17 63.8 kg (140 lb 10.5 oz)  09/18/17 65.1 kg (143 lb 9.6 oz)  07/09/17 64.6 kg (142 lb 6.4 oz)   Temp Readings from Last 3 Encounters:  10/20/17 97.6 F (36.4 C) (Oral)  07/09/17 97.9 F (36.6 C) (Oral)  05/27/17 98 F (36.7 C) (Oral)   BP Readings from Last 3 Encounters:  10/20/17 130/80  09/18/17 (!) 149/99  07/09/17 (!) 221/138   Pulse Readings from Last 3 Encounters:  10/20/17 70  09/18/17 79  07/09/17 71    Physical Exam: Exam not able to be completed at time of d/c as pt LEFT AMA  6:33 PM 10/21/17  Cherene Altes, MD [facility] Office  [phone] Pager [phone]  On-Call/Text Page:      [email or web address]      password [facility]

## 2017-10-22 DIAGNOSIS — N2581 Secondary hyperparathyroidism of renal origin: Secondary | ICD-10-CM | POA: Diagnosis not present

## 2017-10-22 DIAGNOSIS — D689 Coagulation defect, unspecified: Secondary | ICD-10-CM | POA: Diagnosis not present

## 2017-10-22 DIAGNOSIS — D509 Iron deficiency anemia, unspecified: Secondary | ICD-10-CM | POA: Diagnosis not present

## 2017-10-22 DIAGNOSIS — N186 End stage renal disease: Secondary | ICD-10-CM | POA: Diagnosis not present

## 2017-10-22 DIAGNOSIS — D631 Anemia in chronic kidney disease: Secondary | ICD-10-CM | POA: Diagnosis not present

## 2017-10-23 DIAGNOSIS — T8249XA Other complication of vascular dialysis catheter, initial encounter: Secondary | ICD-10-CM | POA: Diagnosis not present

## 2017-10-23 DIAGNOSIS — Z992 Dependence on renal dialysis: Secondary | ICD-10-CM | POA: Diagnosis not present

## 2017-10-23 DIAGNOSIS — N186 End stage renal disease: Secondary | ICD-10-CM | POA: Diagnosis not present

## 2017-10-24 DIAGNOSIS — N186 End stage renal disease: Secondary | ICD-10-CM | POA: Diagnosis not present

## 2017-10-24 DIAGNOSIS — N2581 Secondary hyperparathyroidism of renal origin: Secondary | ICD-10-CM | POA: Diagnosis not present

## 2017-10-24 DIAGNOSIS — D631 Anemia in chronic kidney disease: Secondary | ICD-10-CM | POA: Diagnosis not present

## 2017-10-24 DIAGNOSIS — D509 Iron deficiency anemia, unspecified: Secondary | ICD-10-CM | POA: Diagnosis not present

## 2017-10-24 DIAGNOSIS — D689 Coagulation defect, unspecified: Secondary | ICD-10-CM | POA: Diagnosis not present

## 2017-10-26 DIAGNOSIS — D689 Coagulation defect, unspecified: Secondary | ICD-10-CM | POA: Diagnosis not present

## 2017-10-26 DIAGNOSIS — N186 End stage renal disease: Secondary | ICD-10-CM | POA: Diagnosis not present

## 2017-10-26 DIAGNOSIS — D631 Anemia in chronic kidney disease: Secondary | ICD-10-CM | POA: Diagnosis not present

## 2017-10-26 DIAGNOSIS — D509 Iron deficiency anemia, unspecified: Secondary | ICD-10-CM | POA: Diagnosis not present

## 2017-10-26 DIAGNOSIS — N2581 Secondary hyperparathyroidism of renal origin: Secondary | ICD-10-CM | POA: Diagnosis not present

## 2017-10-29 ENCOUNTER — Emergency Department (HOSPITAL_COMMUNITY)
Admission: EM | Admit: 2017-10-29 | Discharge: 2017-10-29 | Disposition: A | Discharge status: Home / Self Care | Payer: Medicare Other | Attending: Emergency Medicine | Admitting: Emergency Medicine

## 2017-10-29 ENCOUNTER — Encounter (HOSPITAL_COMMUNITY): Payer: Self-pay | Admitting: Emergency Medicine

## 2017-10-29 ENCOUNTER — Emergency Department (HOSPITAL_COMMUNITY): Payer: Medicare Other

## 2017-10-29 DIAGNOSIS — D509 Iron deficiency anemia, unspecified: Secondary | ICD-10-CM | POA: Diagnosis not present

## 2017-10-29 DIAGNOSIS — R0602 Shortness of breath: Secondary | ICD-10-CM | POA: Diagnosis not present

## 2017-10-29 DIAGNOSIS — R42 Dizziness and giddiness: Secondary | ICD-10-CM | POA: Diagnosis present

## 2017-10-29 DIAGNOSIS — Z8673 Personal history of transient ischemic attack (TIA), and cerebral infarction without residual deficits: Secondary | ICD-10-CM | POA: Insufficient documentation

## 2017-10-29 DIAGNOSIS — I120 Hypertensive chronic kidney disease with stage 5 chronic kidney disease or end stage renal disease: Secondary | ICD-10-CM | POA: Insufficient documentation

## 2017-10-29 DIAGNOSIS — F17210 Nicotine dependence, cigarettes, uncomplicated: Secondary | ICD-10-CM | POA: Diagnosis not present

## 2017-10-29 DIAGNOSIS — E161 Other hypoglycemia: Secondary | ICD-10-CM | POA: Diagnosis not present

## 2017-10-29 DIAGNOSIS — D631 Anemia in chronic kidney disease: Secondary | ICD-10-CM | POA: Diagnosis not present

## 2017-10-29 DIAGNOSIS — D689 Coagulation defect, unspecified: Secondary | ICD-10-CM | POA: Diagnosis not present

## 2017-10-29 DIAGNOSIS — N2581 Secondary hyperparathyroidism of renal origin: Secondary | ICD-10-CM | POA: Diagnosis not present

## 2017-10-29 DIAGNOSIS — E875 Hyperkalemia: Secondary | ICD-10-CM | POA: Insufficient documentation

## 2017-10-29 DIAGNOSIS — E162 Hypoglycemia, unspecified: Secondary | ICD-10-CM

## 2017-10-29 DIAGNOSIS — N186 End stage renal disease: Secondary | ICD-10-CM | POA: Insufficient documentation

## 2017-10-29 DIAGNOSIS — Z79899 Other long term (current) drug therapy: Secondary | ICD-10-CM | POA: Insufficient documentation

## 2017-10-29 DIAGNOSIS — Z992 Dependence on renal dialysis: Secondary | ICD-10-CM | POA: Insufficient documentation

## 2017-10-29 LAB — CBC
HCT: 30.3 % — ABNORMAL LOW (ref 39.0–52.0)
HEMOGLOBIN: 9.8 g/dL — AB (ref 13.0–17.0)
MCH: 29.5 pg (ref 26.0–34.0)
MCHC: 32.3 g/dL (ref 30.0–36.0)
MCV: 91.3 fL (ref 78.0–100.0)
Platelets: 138 10*3/uL — ABNORMAL LOW (ref 150–400)
RBC: 3.32 MIL/uL — ABNORMAL LOW (ref 4.22–5.81)
RDW: 17.5 % — ABNORMAL HIGH (ref 11.5–15.5)
WBC: 7 10*3/uL (ref 4.0–10.5)

## 2017-10-29 LAB — BASIC METABOLIC PANEL
Anion gap: 12 (ref 5–15)
BUN: 66 mg/dL — AB (ref 6–20)
CHLORIDE: 97 mmol/L — AB (ref 101–111)
CO2: 24 mmol/L (ref 22–32)
CREATININE: 13.45 mg/dL — AB (ref 0.61–1.24)
Calcium: 8.4 mg/dL — ABNORMAL LOW (ref 8.9–10.3)
GFR calc Af Amer: 4 mL/min — ABNORMAL LOW (ref >60)
GFR calc non Af Amer: 4 mL/min — ABNORMAL LOW (ref >60)
GLUCOSE: 89 mg/dL (ref 65–99)
Potassium: 6.1 mmol/L — ABNORMAL HIGH (ref 3.5–5.1)
SODIUM: 133 mmol/L — AB (ref 135–145)

## 2017-10-29 LAB — CBG MONITORING, ED
GLUCOSE-CAPILLARY: 114 mg/dL — AB (ref 65–99)
Glucose-Capillary: 56 mg/dL — ABNORMAL LOW (ref 65–99)
Glucose-Capillary: 117 mg/dL — ABNORMAL HIGH (ref 65–99)

## 2017-10-29 NOTE — ED Triage Notes (Signed)
Reports being here two weeks ago for elevated potassium and low sugar.  Woke this morning feeling shaky.  Checked cgb in triage.  Noted to be 56.  Coke given.  Pt denies being a diabetic.

## 2017-10-29 NOTE — Discharge Instructions (Signed)
DO NOT SKIP MEALS AND TRY TO EAT AT LEAST SMALL SNACKS THROUGHOUT THE DAY. GO TO DIALYSIS TODAY. RETURN TO ER IF WORSENING SHORTNESS OF BREATH, CHEST PAIN, OR RETURN OF SHAKINESS EVEN AFTER TRYING TO EAT SOMETHING.

## 2017-10-29 NOTE — ED Provider Notes (Signed)
[place] EMERGENCY DEPARTMENT Provider Note   CSN: [id number] Arrival date & time: 10/29/17  3235     History   Chief Complaint Chief Complaint  Patient presents with  . "feels funny"    HPI Marvin Roberson is a 46 y.o. male.  46yo M w/ PMH including ESRD on HD, adrenal insufficiency, HLD, MGUS who p/w "feeling funny." Pt woke up this morning feeling off, shaky. He reports associated feeling like he's smelling chlorine, lightheadedness. He ate a very small amount for breakfast but didn't eat for the rest of the day. He was here 2 weeks ago for elevated potassium and low BG, he feels similar today. He reports feeling short of breath today.   No nausea/vomiting, fevers, diarrhea, cough/cold sx, or changes in meds.    The history is provided by the patient.    Past Medical History:  Diagnosis Date  . Adrenal insufficiency ([place])   . Dyspnea    "when I have too much fluid"  . ESRD on dialysis [facility]) since 1990s   "TTS; [address]" (11/27/2016)  . GERD (gastroesophageal reflux disease)   . High cholesterol   . History of blood transfusion   . Hypertension   . Kidney failure   . Renal insufficiency   . Stroke [facility]) 2016   decreased vision in his left eye/notes 11/27/2016    Patient Active Problem List   Diagnosis Date Noted  . Hyperkalemia, diminished renal excretion 10/20/2017  . Post-operative complication 57/32/2025  . 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[place])   . ESRD on hemodialysis ([place])   . 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 Adrenal insufficiency ([place])   . Hypertension 11/27/2016  . History of CVA (cerebrovascular accident) 11/27/2016  . Monoclonal gammopathy of unknown significance (MGUS) 11/27/2016    Past Surgical History:  Procedure Laterality Date  . ANGIOPLASTY Left 12/11/2016   Procedure: ANGIOPLASTY LEFT ARM & SUBCLAVIAN ARTERY;  Surgeon: Conrad Fontanet, MD;  Location: [place];  Service: Vascular;  Laterality: Left;  . arm surgery Left  2016   "for aneurysm"  . AV FISTULA PLACEMENT    . FALSE ANEURYSM REPAIR Left 12/11/2016   Procedure: RESECTION  BRACHIAL ARTERY;  Surgeon: Conrad Carlton, MD;  Location: [place];  Service: Vascular;  Laterality: Left;  . INSERTION OF DIALYSIS CATHETER Right    chest  . THROMBECTOMY BRACHIAL ARTERY  12/11/2016   Procedure: THROMBECTOMY BRACHIAL ARTERY AND ULNAR;  Surgeon: Conrad Coplay, MD;  Location: [place];  Service: Vascular;;  . THYROIDECTOMY    . UPPER EXTREMITY ANGIOGRAM Left 12/11/2016   Procedure: LEFT ARM ANGIOGRAM;  Surgeon: Conrad Morven, MD;  Location: [place];  Service: Vascular;  Laterality: Left;  . WOUND EXPLORATION Left 12/11/2016   Procedure: LEFT ARM BRACHIAL ARTERY WITH INTERPOSTIONAL GRAFT;  Surgeon: Conrad [place], MD;  Location: [place];  Service: Vascular;  Laterality: Left;       Home Medications    Prior to Admission medications   Medication Sig Start Date End Date Taking? Authorizing Provider  aspirin EC 81 MG tablet Take 81 mg by mouth daily.    [provider]  atorvastatin (LIPITOR) 40 MG tablet Take 40 mg by mouth daily.    [provider]  carvedilol (COREG) 25 MG tablet Take 25 mg by mouth 2 (two) times daily with a meal.    [provider]  hydrALAZINE (APRESOLINE) 100 MG tablet Take 100 mg by mouth 3 (three) times daily.  [provider]  hydrocortisone (CORTEF) 5 MG tablet TAKE 1 TABLET BY MOUTH TWICE A DAY Patient taking differently: TAKE 1 TABLET (5 MG) BY MOUTH TWICE A DAY 10/09/17   Arnoldo Morale, MD  lactulose (CEPHULAC) 20 g packet Take 1 packet (20 g total) by mouth daily as needed (constipation). 05/26/17   Donne Hazel, MD  losartan (COZAAR) 100 MG tablet Take 100 mg by mouth daily.    [provider]  omeprazole (PRILOSEC) 40 MG capsule Take 40 mg by mouth daily.    [provider]    Family History Family History  Problem Relation Age of Onset  . Hypertension Other   . Adrenal disorder Neg Hx      Social History Social History   Tobacco Use  . Smoking status: Current Every Day Smoker    Packs/day: 0.12    Years: 20.00    Pack years: 2.40    Types: Cigarettes  . Smokeless tobacco: Never Used  . Tobacco comment: Less than 1/2 pk per day  Substance Use Topics  . Alcohol use: Yes    Comment: occ  . Drug use: No     Allergies   Doxycycline; Codeine; Gabapentin; Lisinopril; Povidone-iodine; and Betadine [povidone iodine]   Review of Systems Review of Systems All other systems reviewed and are negative except that which was mentioned in HPI   Physical Exam Updated Vital Signs BP (!) 170/99   Pulse 63   Temp 98 F (36.7 C) (Oral)   Resp 18   Ht 5\' 8"  (1.727 m)   Wt 63 kg (138 lb 14.2 oz)   SpO2 94%   BMI 21.12 kg/m   Physical Exam  Constitutional: He is oriented to person, place, and time. He appears well-developed and well-nourished. No distress.  HENT:  Head: Normocephalic and atraumatic.  Moist mucous membranes  Eyes: Conjunctivae are normal.  Neck: Neck supple.  Cardiovascular: Normal rate, regular rhythm and normal heart sounds.  No murmur heard. Pulmonary/Chest: Effort normal. No respiratory distress.  Diminished breath sounds bilaterally  Abdominal: Soft. Bowel sounds are normal. He exhibits no distension. There is no tenderness.  Musculoskeletal: He exhibits no edema.  Neurological: He is alert and oriented to person, place, and time.  Fluent speech  Skin: Skin is warm and dry.  Psychiatric: He has a normal mood and affect. Judgment normal.  Nursing note and vitals reviewed.    ED Treatments / Results  Labs (all labs ordered are listed, but only abnormal results are displayed) Labs Reviewed  CBC - Abnormal; Notable for the following components:      Result Value   RBC 3.32 (*)    Hemoglobin 9.8 (*)    HCT 30.3 (*)    RDW 17.5 (*)    Platelets 138 (*)    All other components within normal limits  BASIC METABOLIC PANEL - Abnormal;  Notable for the following components:   Sodium 133 (*)    Potassium 6.1 (*)    Chloride 97 (*)    BUN 66 (*)    Creatinine, Ser 13.45 (*)    Calcium 8.4 (*)    GFR calc non Af Amer 4 (*)    GFR calc Af Amer 4 (*)    All other components within normal limits  CBG MONITORING, ED - Abnormal; Notable for the following components:   Glucose-Capillary 56 (*)    All other components within normal limits  CBG MONITORING, ED - Abnormal; Notable for the following  components:   Glucose-Capillary 114 (*)    All other components within normal limits  CBG MONITORING, ED - Abnormal; Notable for the following components:   Glucose-Capillary 117 (*)    All other components within normal limits    EKG  EKG Interpretation  Date/Time:  Wednesday October 29 2017 04:58:09 EST Ventricular Rate:  71 PR Interval:    QRS Duration: 104 QT Interval:  434 QTC Calculation: 472 R Axis:   -60 Text Interpretation:  Sinus rhythm Prolonged PR interval Left anterior fascicular block Probable LVH with secondary repol abnrm No significant change since last tracing Confirmed by Theotis Burrow [phone]) on 10/29/2017 5:32:03 AM Also confirmed by Theotis Burrow [phone]), editor Hattie Perch [phone])  on 10/29/2017 6:36:36 AM       Radiology Dg Chest 2 View  Result Date: 10/29/2017 CLINICAL DATA:  46 y/o M; shortness of breath. End-stage renal disease. EXAM: CHEST  2 VIEW COMPARISON:  10/20/2017 chest radiograph FINDINGS: Stable cardiomegaly. Right central venous catheter tip projects over right atrium. Reticular markings likely representing interstitial pulmonary edema. No consolidation, effusion, or pneumothorax. Left axillary vascular stent and surgical clips. No acute osseous abnormality identified. IMPRESSION: Interstitial pulmonary edema.  Stable cardiomegaly. Electronically Signed   By: Kristine Garbe M.D.   On: 10/29/2017 06:15    Procedures Procedures (including critical care  time)  Medications Ordered in ED Medications - No data to display   Initial Impression / Assessment and Plan / ED Course  I have reviewed the triage vital signs and the nursing notes.  Pertinent labs & imaging results that were available during my care of the patient were reviewed by me and considered in my medical decision making (see chart for details).    Pt due for dialysis today p/w feeling shaky and "off", noted to be hypoglycemic in triage and given a drink with improvement.  Tells me that he did not eat anything all day and did not drink anything aside from water and coffee.  He is not on medications that would typically lower his blood glucose and I suspect his hypoglycemia is related to poor intake.  Labs show potassium of 6.1, EKG unchanged from previous and without any peaked T waves or widened QRS.  The remainder of his lab work is stable.  Chest x-ray does show some interstitial pulmonary edema.  The patient has normal O2 saturation on room air.  Given that he has no EKG changes and no oxygen requirements, I feel he is safe for discharge as he is scheduled for dialysis at his usual clinic in a few hours.  I have emphasized the importance of compliance with dialysis and extensively reviewed return precautions.  Patient voiced understanding and discharged in satisfactory condition. Final Clinical Impressions(s) / ED Diagnoses   Final diagnoses:  Hypoglycemia  Hyperkalemia    ED Discharge Orders    None       Nethaniel Mattie, Wenda Overland, MD 10/29/17 [phone]

## 2017-11-03 DIAGNOSIS — I129 Hypertensive chronic kidney disease with stage 1 through stage 4 chronic kidney disease, or unspecified chronic kidney disease: Secondary | ICD-10-CM | POA: Diagnosis not present

## 2017-11-03 DIAGNOSIS — Z992 Dependence on renal dialysis: Secondary | ICD-10-CM | POA: Diagnosis not present

## 2017-11-03 DIAGNOSIS — D509 Iron deficiency anemia, unspecified: Secondary | ICD-10-CM | POA: Diagnosis not present

## 2017-11-03 DIAGNOSIS — D631 Anemia in chronic kidney disease: Secondary | ICD-10-CM | POA: Diagnosis not present

## 2017-11-03 DIAGNOSIS — N2581 Secondary hyperparathyroidism of renal origin: Secondary | ICD-10-CM | POA: Diagnosis not present

## 2017-11-03 DIAGNOSIS — N186 End stage renal disease: Secondary | ICD-10-CM | POA: Diagnosis not present

## 2017-11-03 DIAGNOSIS — D689 Coagulation defect, unspecified: Secondary | ICD-10-CM | POA: Diagnosis not present

## 2017-11-05 DIAGNOSIS — N186 End stage renal disease: Secondary | ICD-10-CM | POA: Diagnosis not present

## 2017-11-05 DIAGNOSIS — N2581 Secondary hyperparathyroidism of renal origin: Secondary | ICD-10-CM | POA: Diagnosis not present

## 2017-11-05 DIAGNOSIS — D509 Iron deficiency anemia, unspecified: Secondary | ICD-10-CM | POA: Diagnosis not present

## 2017-11-05 DIAGNOSIS — D631 Anemia in chronic kidney disease: Secondary | ICD-10-CM | POA: Diagnosis not present

## 2017-11-07 DIAGNOSIS — D509 Iron deficiency anemia, unspecified: Secondary | ICD-10-CM | POA: Diagnosis not present

## 2017-11-07 DIAGNOSIS — D631 Anemia in chronic kidney disease: Secondary | ICD-10-CM | POA: Diagnosis not present

## 2017-11-07 DIAGNOSIS — N186 End stage renal disease: Secondary | ICD-10-CM | POA: Diagnosis not present

## 2017-11-07 DIAGNOSIS — N2581 Secondary hyperparathyroidism of renal origin: Secondary | ICD-10-CM | POA: Diagnosis not present

## 2017-11-09 ENCOUNTER — Other Ambulatory Visit: Payer: Self-pay | Admitting: Family Medicine

## 2017-11-09 DIAGNOSIS — E2740 Unspecified adrenocortical insufficiency: Secondary | ICD-10-CM

## 2017-11-10 DIAGNOSIS — N2581 Secondary hyperparathyroidism of renal origin: Secondary | ICD-10-CM | POA: Diagnosis not present

## 2017-11-10 DIAGNOSIS — N186 End stage renal disease: Secondary | ICD-10-CM | POA: Diagnosis not present

## 2017-11-10 DIAGNOSIS — D631 Anemia in chronic kidney disease: Secondary | ICD-10-CM | POA: Diagnosis not present

## 2017-11-10 DIAGNOSIS — D509 Iron deficiency anemia, unspecified: Secondary | ICD-10-CM | POA: Diagnosis not present

## 2017-11-12 DIAGNOSIS — D509 Iron deficiency anemia, unspecified: Secondary | ICD-10-CM | POA: Diagnosis not present

## 2017-11-12 DIAGNOSIS — N2581 Secondary hyperparathyroidism of renal origin: Secondary | ICD-10-CM | POA: Diagnosis not present

## 2017-11-12 DIAGNOSIS — D631 Anemia in chronic kidney disease: Secondary | ICD-10-CM | POA: Diagnosis not present

## 2017-11-12 DIAGNOSIS — N186 End stage renal disease: Secondary | ICD-10-CM | POA: Diagnosis not present

## 2017-11-14 DIAGNOSIS — N186 End stage renal disease: Secondary | ICD-10-CM | POA: Diagnosis not present

## 2017-11-14 DIAGNOSIS — N2581 Secondary hyperparathyroidism of renal origin: Secondary | ICD-10-CM | POA: Diagnosis not present

## 2017-11-14 DIAGNOSIS — D509 Iron deficiency anemia, unspecified: Secondary | ICD-10-CM | POA: Diagnosis not present

## 2017-11-14 DIAGNOSIS — D631 Anemia in chronic kidney disease: Secondary | ICD-10-CM | POA: Diagnosis not present

## 2017-11-17 DIAGNOSIS — N186 End stage renal disease: Secondary | ICD-10-CM | POA: Diagnosis not present

## 2017-11-17 DIAGNOSIS — D509 Iron deficiency anemia, unspecified: Secondary | ICD-10-CM | POA: Diagnosis not present

## 2017-11-17 DIAGNOSIS — D631 Anemia in chronic kidney disease: Secondary | ICD-10-CM | POA: Diagnosis not present

## 2017-11-17 DIAGNOSIS — N2581 Secondary hyperparathyroidism of renal origin: Secondary | ICD-10-CM | POA: Diagnosis not present

## 2017-11-21 DIAGNOSIS — D509 Iron deficiency anemia, unspecified: Secondary | ICD-10-CM | POA: Diagnosis not present

## 2017-11-21 DIAGNOSIS — D631 Anemia in chronic kidney disease: Secondary | ICD-10-CM | POA: Diagnosis not present

## 2017-11-21 DIAGNOSIS — N186 End stage renal disease: Secondary | ICD-10-CM | POA: Diagnosis not present

## 2017-11-21 DIAGNOSIS — N2581 Secondary hyperparathyroidism of renal origin: Secondary | ICD-10-CM | POA: Diagnosis not present

## 2017-11-24 DIAGNOSIS — N2581 Secondary hyperparathyroidism of renal origin: Secondary | ICD-10-CM | POA: Diagnosis not present

## 2017-11-24 DIAGNOSIS — D509 Iron deficiency anemia, unspecified: Secondary | ICD-10-CM | POA: Diagnosis not present

## 2017-11-24 DIAGNOSIS — N186 End stage renal disease: Secondary | ICD-10-CM | POA: Diagnosis not present

## 2017-11-24 DIAGNOSIS — D631 Anemia in chronic kidney disease: Secondary | ICD-10-CM | POA: Diagnosis not present

## 2017-11-26 DIAGNOSIS — D631 Anemia in chronic kidney disease: Secondary | ICD-10-CM | POA: Diagnosis not present

## 2017-11-26 DIAGNOSIS — N186 End stage renal disease: Secondary | ICD-10-CM | POA: Diagnosis not present

## 2017-11-26 DIAGNOSIS — D509 Iron deficiency anemia, unspecified: Secondary | ICD-10-CM | POA: Diagnosis not present

## 2017-11-26 DIAGNOSIS — N2581 Secondary hyperparathyroidism of renal origin: Secondary | ICD-10-CM | POA: Diagnosis not present

## 2017-11-28 DIAGNOSIS — D631 Anemia in chronic kidney disease: Secondary | ICD-10-CM | POA: Diagnosis not present

## 2017-11-28 DIAGNOSIS — N2581 Secondary hyperparathyroidism of renal origin: Secondary | ICD-10-CM | POA: Diagnosis not present

## 2017-11-28 DIAGNOSIS — N186 End stage renal disease: Secondary | ICD-10-CM | POA: Diagnosis not present

## 2017-11-28 DIAGNOSIS — D509 Iron deficiency anemia, unspecified: Secondary | ICD-10-CM | POA: Diagnosis not present

## 2017-12-01 DIAGNOSIS — N186 End stage renal disease: Secondary | ICD-10-CM | POA: Diagnosis not present

## 2017-12-01 DIAGNOSIS — D509 Iron deficiency anemia, unspecified: Secondary | ICD-10-CM | POA: Diagnosis not present

## 2017-12-01 DIAGNOSIS — D631 Anemia in chronic kidney disease: Secondary | ICD-10-CM | POA: Diagnosis not present

## 2017-12-01 DIAGNOSIS — N2581 Secondary hyperparathyroidism of renal origin: Secondary | ICD-10-CM | POA: Diagnosis not present

## 2017-12-03 DIAGNOSIS — D631 Anemia in chronic kidney disease: Secondary | ICD-10-CM | POA: Diagnosis not present

## 2017-12-03 DIAGNOSIS — N186 End stage renal disease: Secondary | ICD-10-CM | POA: Diagnosis not present

## 2017-12-03 DIAGNOSIS — D509 Iron deficiency anemia, unspecified: Secondary | ICD-10-CM | POA: Diagnosis not present

## 2017-12-03 DIAGNOSIS — N2581 Secondary hyperparathyroidism of renal origin: Secondary | ICD-10-CM | POA: Diagnosis not present

## 2017-12-04 DIAGNOSIS — I129 Hypertensive chronic kidney disease with stage 1 through stage 4 chronic kidney disease, or unspecified chronic kidney disease: Secondary | ICD-10-CM | POA: Diagnosis not present

## 2017-12-04 DIAGNOSIS — N186 End stage renal disease: Secondary | ICD-10-CM | POA: Diagnosis not present

## 2017-12-04 DIAGNOSIS — Z992 Dependence on renal dialysis: Secondary | ICD-10-CM | POA: Diagnosis not present

## 2017-12-05 DIAGNOSIS — I129 Hypertensive chronic kidney disease with stage 1 through stage 4 chronic kidney disease, or unspecified chronic kidney disease: Secondary | ICD-10-CM | POA: Diagnosis not present

## 2017-12-05 DIAGNOSIS — N2581 Secondary hyperparathyroidism of renal origin: Secondary | ICD-10-CM | POA: Diagnosis not present

## 2017-12-05 DIAGNOSIS — D631 Anemia in chronic kidney disease: Secondary | ICD-10-CM | POA: Diagnosis not present

## 2017-12-05 DIAGNOSIS — Z992 Dependence on renal dialysis: Secondary | ICD-10-CM | POA: Diagnosis not present

## 2017-12-05 DIAGNOSIS — N186 End stage renal disease: Secondary | ICD-10-CM | POA: Diagnosis not present

## 2017-12-08 DIAGNOSIS — Z992 Dependence on renal dialysis: Secondary | ICD-10-CM | POA: Diagnosis not present

## 2017-12-08 DIAGNOSIS — D631 Anemia in chronic kidney disease: Secondary | ICD-10-CM | POA: Diagnosis not present

## 2017-12-08 DIAGNOSIS — N2581 Secondary hyperparathyroidism of renal origin: Secondary | ICD-10-CM | POA: Diagnosis not present

## 2017-12-08 DIAGNOSIS — N186 End stage renal disease: Secondary | ICD-10-CM | POA: Diagnosis not present

## 2017-12-10 DIAGNOSIS — N186 End stage renal disease: Secondary | ICD-10-CM | POA: Diagnosis not present

## 2017-12-10 DIAGNOSIS — D631 Anemia in chronic kidney disease: Secondary | ICD-10-CM | POA: Diagnosis not present

## 2017-12-10 DIAGNOSIS — Z992 Dependence on renal dialysis: Secondary | ICD-10-CM | POA: Diagnosis not present

## 2017-12-10 DIAGNOSIS — N2581 Secondary hyperparathyroidism of renal origin: Secondary | ICD-10-CM | POA: Diagnosis not present

## 2017-12-12 DIAGNOSIS — N186 End stage renal disease: Secondary | ICD-10-CM | POA: Diagnosis not present

## 2017-12-12 DIAGNOSIS — D631 Anemia in chronic kidney disease: Secondary | ICD-10-CM | POA: Diagnosis not present

## 2017-12-12 DIAGNOSIS — N2581 Secondary hyperparathyroidism of renal origin: Secondary | ICD-10-CM | POA: Diagnosis not present

## 2017-12-12 DIAGNOSIS — Z992 Dependence on renal dialysis: Secondary | ICD-10-CM | POA: Diagnosis not present

## 2017-12-15 DIAGNOSIS — N2581 Secondary hyperparathyroidism of renal origin: Secondary | ICD-10-CM | POA: Diagnosis not present

## 2017-12-15 DIAGNOSIS — D631 Anemia in chronic kidney disease: Secondary | ICD-10-CM | POA: Diagnosis not present

## 2017-12-15 DIAGNOSIS — Z992 Dependence on renal dialysis: Secondary | ICD-10-CM | POA: Diagnosis not present

## 2017-12-15 DIAGNOSIS — N186 End stage renal disease: Secondary | ICD-10-CM | POA: Diagnosis not present

## 2017-12-17 DIAGNOSIS — D631 Anemia in chronic kidney disease: Secondary | ICD-10-CM | POA: Diagnosis not present

## 2017-12-17 DIAGNOSIS — Z992 Dependence on renal dialysis: Secondary | ICD-10-CM | POA: Diagnosis not present

## 2017-12-17 DIAGNOSIS — N186 End stage renal disease: Secondary | ICD-10-CM | POA: Diagnosis not present

## 2017-12-17 DIAGNOSIS — N2581 Secondary hyperparathyroidism of renal origin: Secondary | ICD-10-CM | POA: Diagnosis not present

## 2017-12-19 DIAGNOSIS — N186 End stage renal disease: Secondary | ICD-10-CM | POA: Diagnosis not present

## 2017-12-19 DIAGNOSIS — N2581 Secondary hyperparathyroidism of renal origin: Secondary | ICD-10-CM | POA: Diagnosis not present

## 2017-12-19 DIAGNOSIS — Z992 Dependence on renal dialysis: Secondary | ICD-10-CM | POA: Diagnosis not present

## 2017-12-19 DIAGNOSIS — D631 Anemia in chronic kidney disease: Secondary | ICD-10-CM | POA: Diagnosis not present

## 2017-12-22 DIAGNOSIS — D631 Anemia in chronic kidney disease: Secondary | ICD-10-CM | POA: Diagnosis not present

## 2017-12-22 DIAGNOSIS — Z992 Dependence on renal dialysis: Secondary | ICD-10-CM | POA: Diagnosis not present

## 2017-12-22 DIAGNOSIS — N186 End stage renal disease: Secondary | ICD-10-CM | POA: Diagnosis not present

## 2017-12-22 DIAGNOSIS — N2581 Secondary hyperparathyroidism of renal origin: Secondary | ICD-10-CM | POA: Diagnosis not present

## 2017-12-23 ENCOUNTER — Other Ambulatory Visit: Payer: Self-pay

## 2017-12-23 ENCOUNTER — Encounter: Payer: Self-pay | Admitting: Vascular Surgery

## 2017-12-23 ENCOUNTER — Ambulatory Visit (INDEPENDENT_AMBULATORY_CARE_PROVIDER_SITE_OTHER): Payer: Medicare Other | Admitting: Vascular Surgery

## 2017-12-23 ENCOUNTER — Ambulatory Visit: Payer: Medicare Other | Admitting: Vascular Surgery

## 2017-12-23 VITALS — BP 142/98 | HR 71 | Temp 97.4°F | Resp 16 | Ht 68.0 in | Wt 143.0 lb

## 2017-12-23 DIAGNOSIS — I721 Aneurysm of artery of upper extremity: Secondary | ICD-10-CM | POA: Diagnosis not present

## 2017-12-23 DIAGNOSIS — N186 End stage renal disease: Secondary | ICD-10-CM | POA: Diagnosis not present

## 2017-12-23 NOTE — Progress Notes (Signed)
[facility]ist of [place]  Patient name: Marvin Roberson MRN: [id number] DOB: September 28, 1971 Sex: male  REASON FOR VISIT: Evaluation for expanding false aneurysm left antecubital space  HPI: Marvin Roberson is a 47 y.o. male here today for evaluation.  He has a very complex past surgical history.  He had undergone prior left axillary pseudoaneurysm with left axillary to brachial bypass at [facility].  He had presented with an expanding false aneurysm at the antecubital level and underwent extensive surgery with Dr. Bridgett Larsson on December 11, 2016.  He had resection of his left brachial artery false aneurysm and had an interposition with a 6 mm Artegraft.  Also had left subclavian retrograde angioplasty he presents now with pain and increasingly enlarging false aneurysm.  Past Medical History:  Diagnosis Date  . Adrenal insufficiency ([place])   . Dyspnea    "when I have too much fluid"  . ESRD on dialysis [facility]) since 1990s   "TTS; [address]" (11/27/2016)  . GERD (gastroesophageal reflux disease)   . High cholesterol   . History of blood transfusion   . Hypertension   . Kidney failure   . Renal insufficiency   . Stroke [facility]) 2016   decreased vision in his left eye/notes 11/27/2016    Family History  Problem Relation Age of Onset  . Hypertension Other   . Adrenal disorder Neg Hx     SOCIAL HISTORY: Social History   Tobacco Use  . Smoking status: Current Every Day Smoker    Packs/day: 0.12    Years: 20.00    Pack years: 2.40    Types: Cigarettes  . Smokeless tobacco: Never Used  . Tobacco comment: Less than 1/2 pk per day  Substance Use Topics  . Alcohol use: Yes    Comment: occ    Allergies  Allergen Reactions  . Doxycycline Shortness Of Breath  . Codeine Nausea And Vomiting  . Gabapentin Other (See Comments)    Sleepiness, altered mental state  . Lisinopril Other (See Comments) and Swelling    angioedema  . Povidone-Iodine  Itching  . Betadine [Povidone Iodine] Rash    Current Outpatient Medications  Medication Sig Dispense Refill  . aspirin EC 81 MG tablet Take 81 mg by mouth daily.    Marland Kitchen atorvastatin (LIPITOR) 40 MG tablet Take 40 mg by mouth daily.    . carvedilol (COREG) 25 MG tablet Take 25 mg by mouth 2 (two) times daily with a meal.    . hydrALAZINE (APRESOLINE) 100 MG tablet Take 100 mg by mouth 3 (three) times daily.    . hydrocortisone (CORTEF) 5 MG tablet TAKE 1 TABLET BY MOUTH TWICE A DAY (Patient taking differently: TAKE 1 TABLET (5 MG) BY MOUTH TWICE A DAY) 30 tablet 0  . lactulose (CEPHULAC) 20 g packet Take 1 packet (20 g total) by mouth daily as needed (constipation). 30 each 0  . losartan (COZAAR) 100 MG tablet Take 100 mg by mouth daily.    Marland Kitchen omeprazole (PRILOSEC) 40 MG capsule Take 40 mg by mouth daily.     No current facility-administered medications for this visit.     REVIEW OF SYSTEMS:  [X]  denotes positive finding, [ ]  denotes negative finding Cardiac  Comments:  Chest pain or chest pressure:    Shortness of breath upon exertion:    Short of breath when lying flat:    Irregular heart rhythm:        Vascular    Pain in calf,  thigh, or hip brought on by ambulation:    Pain in feet at night that wakes you up from your sleep:     Blood clot in your veins:    Leg swelling:           PHYSICAL EXAM: Vitals:   12/23/17 1510  BP: (!) 142/98  Pulse: 71  Resp: 16  Temp: (!) 97.4 F (36.3 C)  TempSrc: Oral  SpO2: 97%  Weight: 143 lb (64.9 kg)  Height: 5\' 8"  (1.727 m)    GENERAL: The patient is a well-nourished male, in no acute distress. The vital signs are documented above. CARDIOVASCULAR: Does have a palpable left radial pulse.  Multiple extensive scars left arm on the forearm and upper arm.  He does have a tender 2-3 cm false aneurysm at the antecubital space.  No tenderness above and below and no surrounding erythema PULMONARY: There is good air exchange    MUSCULOSKELETAL: There are no major deformities or cyanosis. NEUROLOGIC: No focal weakness or paresthesias are detected. SKIN: There are no ulcers or rashes noted. PSYCHIATRIC: The patient has a normal affect.  DATA:  None  MEDICAL ISSUES: Recurrent false aneurysm antecubital area of prior artery graft placement.  I have recommended arteriography for further evaluation.  The patient had been recommended to be referred back to [facility] and he has refused this.  He does have hemodialysis on Monday Wednesday Friday.  He will undergo CT angiogram of his arm for further evaluation and surgical repair following this.  I did discuss the option of using saphenous vein to hopefully reduce his ongoing recurrent false aneurysm development.  We will make final arrangements pending the CT scan    Rosetta Posner, MD [facility] of [facility] Tel [phone] Pager [phone]

## 2017-12-23 NOTE — H&P (View-Only) (Signed)
Vascular and Vein Specialist of [place]  Patient name: Marvin Roberson MRN: [id number] DOB: Mar 09, 1971 Sex: male  REASON FOR VISIT: Evaluation for expanding false aneurysm left antecubital space  HPI: Marvin Roberson is a 47 y.o. male here today for evaluation.  He has a very complex past surgical history.  He had undergone prior left axillary pseudoaneurysm with left axillary to brachial bypass at [facility].  He had presented with an expanding false aneurysm at the antecubital level and underwent extensive surgery with Dr. Bridgett Larsson on December 11, 2016.  He had resection of his left brachial artery false aneurysm and had an interposition with a 6 mm Artegraft.  Also had left subclavian retrograde angioplasty he presents now with pain and increasingly enlarging false aneurysm.  Past Medical History:  Diagnosis Date  . Adrenal insufficiency ([place] [place])   . Dyspnea    "when I have too much fluid"  . ESRD on dialysis [facility]) since 1990s   "TTS; [address]" (11/27/2016)  . GERD (gastroesophageal reflux disease)   . High cholesterol   . History of blood transfusion   . Hypertension   . Kidney failure   . Renal insufficiency   . Stroke [facility]) 2016   decreased vision in his left eye/notes 11/27/2016    Family History  Problem Relation Age of Onset  . Hypertension Other   . Adrenal disorder Neg Hx     SOCIAL HISTORY: Social History   Tobacco Use  . Smoking status: Current Every Day Smoker    Packs/day: 0.12    Years: 20.00    Pack years: 2.40    Types: Cigarettes  . Smokeless tobacco: Never Used  . Tobacco comment: Less than 1/2 pk per day  Substance Use Topics  . Alcohol use: Yes    Comment: occ    Allergies  Allergen Reactions  . Doxycycline Shortness Of Breath  . Codeine Nausea And Vomiting  . Gabapentin Other (See Comments)    Sleepiness, altered mental state  . Lisinopril Other (See Comments) and Swelling    angioedema  . Povidone-Iodine  Itching  . Betadine [Povidone Iodine] Rash    Current Outpatient Medications  Medication Sig Dispense Refill  . aspirin EC 81 MG tablet Take 81 mg by mouth daily.    Marland Kitchen atorvastatin (LIPITOR) 40 MG tablet Take 40 mg by mouth daily.    . carvedilol (COREG) 25 MG tablet Take 25 mg by mouth 2 (two) times daily with a meal.    . hydrALAZINE (APRESOLINE) 100 MG tablet Take 100 mg by mouth 3 (three) times daily.    . hydrocortisone (CORTEF) 5 MG tablet TAKE 1 TABLET BY MOUTH TWICE A DAY (Patient taking differently: TAKE 1 TABLET (5 MG) BY MOUTH TWICE A DAY) 30 tablet 0  . lactulose (CEPHULAC) 20 g packet Take 1 packet (20 g total) by mouth daily as needed (constipation). 30 each 0  . losartan (COZAAR) 100 MG tablet Take 100 mg by mouth daily.    Marland Kitchen omeprazole (PRILOSEC) 40 MG capsule Take 40 mg by mouth daily.     No current facility-administered medications for this visit.     REVIEW OF SYSTEMS:  [X]  denotes positive finding, [ ]  denotes negative finding Cardiac  Comments:  Chest pain or chest pressure:    Shortness of breath upon exertion:    Short of breath when lying flat:    Irregular heart rhythm:        Vascular    Pain in calf,  thigh, or hip brought on by ambulation:    Pain in feet at night that wakes you up from your sleep:     Blood clot in your veins:    Leg swelling:           PHYSICAL EXAM: Vitals:   12/23/17 1510  BP: (!) 142/98  Pulse: 71  Resp: 16  Temp: (!) 97.4 F (36.3 C)  TempSrc: Oral  SpO2: 97%  Weight: 143 lb (64.9 kg)  Height: 5\' 8"  (1.727 m)    GENERAL: The patient is a well-nourished male, in no acute distress. The vital signs are documented above. CARDIOVASCULAR: Does have a palpable left radial pulse.  Multiple extensive scars left arm on the forearm and upper arm.  He does have a tender 2-3 cm false aneurysm at the antecubital space.  No tenderness above and below and no surrounding erythema PULMONARY: There is good air exchange    MUSCULOSKELETAL: There are no major deformities or cyanosis. NEUROLOGIC: No focal weakness or paresthesias are detected. SKIN: There are no ulcers or rashes noted. PSYCHIATRIC: The patient has a normal affect.  DATA:  None  MEDICAL ISSUES: Recurrent false aneurysm antecubital area of prior artery graft placement.  I have recommended arteriography for further evaluation.  The patient had been recommended to be referred back to [facility] and he has refused this.  He does have hemodialysis on Monday Wednesday Friday.  He will undergo CT angiogram of his arm for further evaluation and surgical repair following this.  I did discuss the option of using saphenous vein to hopefully reduce his ongoing recurrent false aneurysm development.  We will make final arrangements pending the CT scan    Rosetta Posner, MD [facility] Vascular and Vein Specialists of [facility] Tel [phone] Pager [phone]

## 2017-12-25 DIAGNOSIS — J111 Influenza due to unidentified influenza virus with other respiratory manifestations: Secondary | ICD-10-CM | POA: Diagnosis not present

## 2017-12-25 DIAGNOSIS — Z79899 Other long term (current) drug therapy: Secondary | ICD-10-CM | POA: Diagnosis not present

## 2017-12-25 DIAGNOSIS — N186 End stage renal disease: Secondary | ICD-10-CM | POA: Diagnosis not present

## 2017-12-25 DIAGNOSIS — F17210 Nicotine dependence, cigarettes, uncomplicated: Secondary | ICD-10-CM | POA: Insufficient documentation

## 2017-12-25 DIAGNOSIS — E7800 Pure hypercholesterolemia, unspecified: Secondary | ICD-10-CM | POA: Diagnosis not present

## 2017-12-25 DIAGNOSIS — R52 Pain, unspecified: Secondary | ICD-10-CM | POA: Diagnosis present

## 2017-12-25 DIAGNOSIS — Z992 Dependence on renal dialysis: Secondary | ICD-10-CM | POA: Insufficient documentation

## 2017-12-25 DIAGNOSIS — I120 Hypertensive chronic kidney disease with stage 5 chronic kidney disease or end stage renal disease: Secondary | ICD-10-CM | POA: Diagnosis not present

## 2017-12-25 DIAGNOSIS — Z8673 Personal history of transient ischemic attack (TIA), and cerebral infarction without residual deficits: Secondary | ICD-10-CM | POA: Diagnosis not present

## 2017-12-25 DIAGNOSIS — Z7982 Long term (current) use of aspirin: Secondary | ICD-10-CM | POA: Insufficient documentation

## 2017-12-25 NOTE — ED Triage Notes (Signed)
Patient here with generalized body aches, not feeling well since Monday.  Patient is a dialysis patient, went to dialysis on Monday but did not go on Wednesday.  He states he has not been able to eat since then and has been able to drink.  He states he just does not feel well.

## 2017-12-26 ENCOUNTER — Emergency Department (HOSPITAL_COMMUNITY)
Admission: EM | Admit: 2017-12-26 | Discharge: 2017-12-26 | Disposition: A | Discharge status: Home / Self Care | Payer: Medicare Other | Attending: Emergency Medicine | Admitting: Emergency Medicine

## 2017-12-26 ENCOUNTER — Other Ambulatory Visit: Payer: Self-pay

## 2017-12-26 ENCOUNTER — Encounter (HOSPITAL_COMMUNITY): Payer: Self-pay | Admitting: Emergency Medicine

## 2017-12-26 DIAGNOSIS — Z992 Dependence on renal dialysis: Secondary | ICD-10-CM

## 2017-12-26 DIAGNOSIS — D631 Anemia in chronic kidney disease: Secondary | ICD-10-CM | POA: Diagnosis not present

## 2017-12-26 DIAGNOSIS — N186 End stage renal disease: Secondary | ICD-10-CM | POA: Diagnosis not present

## 2017-12-26 DIAGNOSIS — J111 Influenza due to unidentified influenza virus with other respiratory manifestations: Secondary | ICD-10-CM | POA: Diagnosis not present

## 2017-12-26 DIAGNOSIS — N2581 Secondary hyperparathyroidism of renal origin: Secondary | ICD-10-CM | POA: Diagnosis not present

## 2017-12-26 DIAGNOSIS — R69 Illness, unspecified: Secondary | ICD-10-CM

## 2017-12-26 LAB — CBC WITH DIFFERENTIAL/PLATELET
BASOS ABS: 0 10*3/uL (ref 0.0–0.1)
Basophils Relative: 0 %
EOS PCT: 5 %
Eosinophils Absolute: 0.3 10*3/uL (ref 0.0–0.7)
HEMATOCRIT: 32 % — AB (ref 39.0–52.0)
HEMOGLOBIN: 10.7 g/dL — AB (ref 13.0–17.0)
LYMPHS ABS: 1.2 10*3/uL (ref 0.7–4.0)
LYMPHS PCT: 19 %
MCH: 29.8 pg (ref 26.0–34.0)
MCHC: 33.4 g/dL (ref 30.0–36.0)
MCV: 89.1 fL (ref 78.0–100.0)
Monocytes Absolute: 0.4 10*3/uL (ref 0.1–1.0)
Monocytes Relative: 6 %
NEUTROS ABS: 4.6 10*3/uL (ref 1.7–7.7)
NEUTROS PCT: 70 %
PLATELETS: 121 10*3/uL — AB (ref 150–400)
RBC: 3.59 MIL/uL — AB (ref 4.22–5.81)
RDW: 17.6 % — ABNORMAL HIGH (ref 11.5–15.5)
WBC: 6.6 10*3/uL (ref 4.0–10.5)

## 2017-12-26 LAB — COMPREHENSIVE METABOLIC PANEL
ALK PHOS: 82 U/L (ref 38–126)
ALT: 13 U/L — AB (ref 17–63)
AST: 17 U/L (ref 15–41)
Albumin: 3.3 g/dL — ABNORMAL LOW (ref 3.5–5.0)
Anion gap: 15 (ref 5–15)
BUN: 68 mg/dL — AB (ref 6–20)
CALCIUM: 7.9 mg/dL — AB (ref 8.9–10.3)
CHLORIDE: 94 mmol/L — AB (ref 101–111)
CO2: 20 mmol/L — ABNORMAL LOW (ref 22–32)
CREATININE: 16.99 mg/dL — AB (ref 0.61–1.24)
GFR calc Af Amer: 3 mL/min — ABNORMAL LOW (ref >60)
GFR, EST NON AFRICAN AMERICAN: 3 mL/min — AB (ref >60)
Glucose, Bld: 94 mg/dL (ref 65–99)
Potassium: 5.2 mmol/L — ABNORMAL HIGH (ref 3.5–5.1)
Sodium: 129 mmol/L — ABNORMAL LOW (ref 135–145)
Total Bilirubin: 0.4 mg/dL (ref 0.3–1.2)
Total Protein: 8.1 g/dL (ref 6.5–8.1)

## 2017-12-26 MED ORDER — OSELTAMIVIR PHOSPHATE 30 MG PO CAPS: 30.0000 mg | ORAL_CAPSULE | Freq: Once | ORAL | Status: DC

## 2017-12-26 MED ORDER — HYDROCORTISONE NA SUCCINATE PF 100 MG IJ SOLR
100.0000 mg | Freq: Once | INTRAMUSCULAR | Status: AC
  Administered 2017-12-26: 100 mg via INTRAVENOUS
  Filled 2017-12-26: qty 2

## 2017-12-26 MED ORDER — OSELTAMIVIR PHOSPHATE 30 MG PO CAPS: ORAL_CAPSULE | ORAL | 0 refills | Status: DC

## 2017-12-26 MED ORDER — ACETAMINOPHEN 500 MG PO TABS
1000.0000 mg | ORAL_TABLET | Freq: Once | ORAL | Status: AC
  Administered 2017-12-26: 1000 mg via ORAL
  Filled 2017-12-26: qty 2

## 2017-12-26 NOTE — ED Notes (Signed)
This RN attempted flu swab. Pt turned head away. Pt refusing flu swab. Jarrett Soho, [place] informed.

## 2017-12-26 NOTE — ED Notes (Signed)
Pt reports he missed dialysis Wednesday because he wasn't feeling good. Pt reports he has dialysis this morning between 11:30 and 12 pm.

## 2017-12-26 NOTE — ED Provider Notes (Signed)
[facility] EMERGENCY DEPARTMENT Provider Note   CSN: [id number] Arrival date & time: 12/25/17  2346     History   Chief Complaint Chief Complaint  Patient presents with  . Generalized Body Aches    HPI Marvin Roberson is a 47 y.o. male with a hx of ESRD on dialysis (MWF), adrenal insufficiency, GERD, HTN,  presents to the Emergency Department complaining of gradual, persistent, progressively worsening generalized weakness onset Sunday (5 days ago). Associated symptoms include cough, congestion, subjective fevers, nausea, myalgias.  Pt denies vomiting or diarrhea.  Pt reports no dialysis on Wednesday due to feeling poorly.  Pt reports missing several doses of hydrocortisone.  He reports drinking fluids but has not been able to eat.  Pt did not receive a flu vaccine.  Nothing makes it better and nothing makes it worse.  Pt denies chest pain, SOB.     The history is provided by the patient and medical records. No language interpreter was used.    Past Medical History:  Diagnosis Date  . Adrenal insufficiency ([place])   . Dyspnea    "when I have too much fluid"  . ESRD on dialysis [facility]) since 1990s   "TTS; [address]" (11/27/2016)  . GERD (gastroesophageal reflux disease)   . High cholesterol   . History of blood transfusion   . Hypertension   . Kidney failure   . Renal insufficiency   . Stroke [facility]) 2016   decreased vision in his left eye/notes 11/27/2016    Patient Active Problem List   Diagnosis Date Noted  . Hyperkalemia, diminished renal excretion 10/20/2017  . Post-operative complication 15/40/0867  . 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[place])   . ESRD on hemodialysis ([place])   . 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 Adrenal insufficiency ([place])   . Hypertension 11/27/2016  . History of CVA (cerebrovascular accident) 11/27/2016  . Monoclonal gammopathy of unknown significance ([facility]) 11/27/2016    Past Surgical History:  Procedure Laterality Date  .  ANGIOPLASTY Left 12/11/2016   Procedure: ANGIOPLASTY LEFT ARM & SUBCLAVIAN ARTERY;  Surgeon: Conrad Mayfield, MD;  Location: [place];  Service: Vascular;  Laterality: Left;  . arm surgery Left 2016   "for aneurysm"  . AV FISTULA PLACEMENT    . FALSE ANEURYSM REPAIR Left 12/11/2016   Procedure: RESECTION  BRACHIAL ARTERY;  Surgeon: Conrad Chehalis, MD;  Location: [place];  Service: Vascular;  Laterality: Left;  . INSERTION OF DIALYSIS CATHETER Right    chest  . THROMBECTOMY BRACHIAL ARTERY  12/11/2016   Procedure: THROMBECTOMY BRACHIAL ARTERY AND ULNAR;  Surgeon: Conrad Marysville, MD;  Location: [place];  Service: Vascular;;  . THYROIDECTOMY    . UPPER EXTREMITY ANGIOGRAM Left 12/11/2016   Procedure: LEFT ARM ANGIOGRAM;  Surgeon: Conrad , MD;  Location: [place];  Service: Vascular;  Laterality: Left;  . WOUND EXPLORATION Left 12/11/2016   Procedure: LEFT ARM BRACHIAL ARTERY WITH INTERPOSTIONAL GRAFT;  Surgeon: Conrad , MD;  Location: [place];  Service: Vascular;  Laterality: Left;       Home Medications    Prior to Admission medications   Medication Sig Start Date End Date Taking? Authorizing Provider  aspirin EC 81 MG tablet Take 81 mg by mouth daily.   Yes [provider]  atorvastatin (LIPITOR) 40 MG tablet Take 40 mg by mouth daily.   Yes [provider]  carvedilol (COREG) 25 MG tablet Take 25 mg by mouth 2 (two) times daily  with a meal.   Yes [provider]  hydrALAZINE (APRESOLINE) 100 MG tablet Take 100 mg by mouth 3 (three) times daily.   Yes [provider]  hydrocortisone (CORTEF) 5 MG tablet TAKE 1 TABLET BY MOUTH TWICE A DAY Patient taking differently: TAKE 1 TABLET (5 MG) BY MOUTH TWICE A DAY 10/09/17  Yes Newlin, Enobong, MD  lactulose (CEPHULAC) 20 g packet Take 1 packet (20 g total) by mouth daily as needed (constipation). 05/26/17  Yes Donne Hazel, MD  losartan (COZAAR) 100 MG tablet Take 100 mg by mouth daily.   Yes [provider]    omeprazole (PRILOSEC) 40 MG capsule Take 40 mg by mouth daily.   Yes [provider]  oseltamivir (TAMIFLU) 30 MG capsule 30mg  after every hemodialysis treatment for 5 days 12/26/17   [place], Jarrett Soho, PA-C    Family History Family History  Problem Relation Age of Onset  . Hypertension Other   . Adrenal disorder Neg Hx     Social History Social History   Tobacco Use  . Smoking status: Current Every Day Smoker    Packs/day: 0.12    Years: 20.00    Pack years: 2.40    Types: Cigarettes  . Smokeless tobacco: Never Used  . Tobacco comment: Less than 1/2 pk per day  Substance Use Topics  . Alcohol use: Yes    Comment: occ  . Drug use: No     Allergies   Doxycycline; Codeine; Gabapentin; Lisinopril; Povidone-iodine; and Betadine [povidone iodine]   Review of Systems Review of Systems  Constitutional: Positive for fatigue and fever. Negative for appetite change, diaphoresis and unexpected weight change.  HENT: Positive for congestion, postnasal drip, rhinorrhea, sinus pressure and sore throat. Negative for mouth sores.   Eyes: Negative for visual disturbance.  Respiratory: Positive for cough. Negative for chest tightness, shortness of breath and wheezing.   Cardiovascular: Negative for chest pain and leg swelling.  Gastrointestinal: Positive for nausea. Negative for abdominal pain, constipation, diarrhea and vomiting.  Endocrine: Negative for polydipsia, polyphagia and polyuria.  Genitourinary: Negative for dysuria, frequency, hematuria and urgency.  Musculoskeletal: Positive for myalgias. Negative for back pain and neck stiffness.  Skin: Negative for rash.  Allergic/Immunologic: Negative for immunocompromised state.  Neurological: Negative for syncope, light-headedness and headaches.  Hematological: Does not bruise/bleed easily.  Psychiatric/Behavioral: Negative for sleep disturbance. The patient is not nervous/anxious.      Physical Exam Updated Vital  Signs BP (!) 147/72   Pulse 69   Temp 99.3 F (37.4 C) (Oral)   Resp 20   SpO2 97%   Physical Exam  Constitutional: He appears well-developed and well-nourished. No distress.  HENT:  Head: Normocephalic and atraumatic.  Right Ear: Tympanic membrane, external ear and ear canal normal.  Left Ear: Tympanic membrane, external ear and ear canal normal.  Nose: Mucosal edema and rhinorrhea present. No epistaxis. Right sinus exhibits no maxillary sinus tenderness and no frontal sinus tenderness. Left sinus exhibits no maxillary sinus tenderness and no frontal sinus tenderness.  Mouth/Throat: Uvula is midline and mucous membranes are normal. Mucous membranes are not pale and not cyanotic. No oropharyngeal exudate, posterior oropharyngeal edema, posterior oropharyngeal erythema or tonsillar abscesses.  Eyes: Conjunctivae are normal. Pupils are equal, round, and reactive to light.  Neck: Normal range of motion and full passive range of motion without pain.  Cardiovascular: Normal rate and intact distal pulses.  Pulmonary/Chest: Effort normal and breath sounds normal. No stridor.  Clear and equal  breath sounds without focal wheezes, rhonchi, rales Dialysis catheter in right upper chest  Abdominal: Soft. There is no tenderness.  Musculoskeletal: Normal range of motion.  Old fistula in left upper arm  Lymphadenopathy:    He has no cervical adenopathy.  Neurological: He is alert.  Skin: Skin is warm and dry. No rash noted. He is not diaphoretic.  Psychiatric: He has a normal mood and affect.  Nursing note and vitals reviewed.    ED Treatments / Results  Labs (all labs ordered are listed, but only abnormal results are displayed) Labs Reviewed  CBC WITH DIFFERENTIAL/PLATELET - Abnormal; Notable for the following components:      Result Value   RBC 3.59 (*)    Hemoglobin 10.7 (*)    HCT 32.0 (*)    RDW 17.6 (*)    Platelets 121 (*)    All other components within normal limits    COMPREHENSIVE METABOLIC PANEL - Abnormal; Notable for the following components:   Sodium 129 (*)    Potassium 5.2 (*)    Chloride 94 (*)    CO2 20 (*)    BUN 68 (*)    Creatinine, Ser 16.99 (*)    Calcium 7.9 (*)    Albumin 3.3 (*)    ALT 13 (*)    GFR calc non Af Amer 3 (*)    GFR calc Af Amer 3 (*)    All other components within normal limits  INFLUENZA PANEL BY PCR (TYPE A & B)     Procedures Procedures (including critical care time)  Medications Ordered in ED Medications  oseltamivir (TAMIFLU) capsule 30 mg (not administered)  hydrocortisone sodium succinate (SOLU-CORTEF) 100 MG injection 100 mg (100 mg Intravenous Given 12/26/17 0529)  acetaminophen (TYLENOL) tablet 1,000 mg (1,000 mg Oral Given 12/26/17 0524)     Initial Impression / Assessment and Plan / ED Course  I have reviewed the triage vital signs and the nursing notes.  Pertinent labs & imaging results that were available during my care of the patient were reviewed by me and considered in my medical decision making (see chart for details).  Clinical Course as of Dec 26 640  Fri Dec 26, 2017  0443 Baseline Hemoglobin: (!) 10.7 [HM]  0443 Minimally elevated Potassium: (!) 5.2 [HM]  0443 Elevated, patient due for dialysis today. Creatinine: (!) 16.99 [HM]    Clinical Course User Index [HM] Smiley Birr, Jarrett Soho, PA-C    Patient presents with influenza-like illness.  Patient is afebrile here in the emergency department but feels warm to touch.  Abdomen is soft and nontender.  Breath sounds are clear and equal.  Chest x-ray not ordered as influenza is likely the diagnosis.  RN attempted flu swab x2.  Patient refuses to have this done.  No treatments prior to arrival.  Patient given acetaminophen and Tamiflu (renal dosing) here.  Patient has missed several doses of his a hydrocortisone therefore Solu-Cortef was given.  He is not vomiting.  No hypotension. Labs are reassuring and patient does have dialysis today.   Will discharge home with Tamiflu and instructions to present to dialysis later this morning.  The patient was discussed with and evaluated by Dr. Venora Maples who agrees with the treatment plan.   Final Clinical Impressions(s) / ED Diagnoses   Final diagnoses:  Influenza-like illness  ESRD on dialysis [facility])    ED Discharge Orders        Ordered    oseltamivir (TAMIFLU) 30 MG capsule  12/26/17 0605       Katessa Attridge, Jarrett Soho, PA-C 12/26/17 1368    Jola Schmidt, MD 12/26/17 [phone]

## 2017-12-26 NOTE — Discharge Instructions (Signed)
1. Medications: Tamiflu, usual home medications 2. Treatment: rest, drink plenty of fluids, take tylenol for fever and body aches 3. Follow Up: Please followup with your primary doctor in 2 days for discussion of your diagnoses and further evaluation after today's visit; if you do not have a primary care doctor use the resource guide provided to find one; Please return to the ER for new or worsening symptoms

## 2017-12-29 ENCOUNTER — Other Ambulatory Visit: Payer: Self-pay | Admitting: *Deleted

## 2017-12-29 ENCOUNTER — Telehealth: Payer: Self-pay | Admitting: *Deleted

## 2017-12-29 ENCOUNTER — Encounter: Payer: Self-pay | Admitting: *Deleted

## 2017-12-29 DIAGNOSIS — D631 Anemia in chronic kidney disease: Secondary | ICD-10-CM | POA: Diagnosis not present

## 2017-12-29 DIAGNOSIS — N2581 Secondary hyperparathyroidism of renal origin: Secondary | ICD-10-CM | POA: Diagnosis not present

## 2017-12-29 DIAGNOSIS — N186 End stage renal disease: Secondary | ICD-10-CM | POA: Diagnosis not present

## 2017-12-29 DIAGNOSIS — Z992 Dependence on renal dialysis: Secondary | ICD-10-CM | POA: Diagnosis not present

## 2017-12-29 NOTE — Telephone Encounter (Signed)
Spoke with Marvin Roberson. At [facility]. To change Dialysis days to accommodate surgery on 01/14/18. Call to patient notified of surgery date and time and will fax instructions to [facility]. At his request.

## 2017-12-29 NOTE — Telephone Encounter (Signed)
[place] at [facility] confirmed they have pre-op letter and will review with patient.

## 2017-12-31 DIAGNOSIS — N2581 Secondary hyperparathyroidism of renal origin: Secondary | ICD-10-CM | POA: Diagnosis not present

## 2017-12-31 DIAGNOSIS — Z992 Dependence on renal dialysis: Secondary | ICD-10-CM | POA: Diagnosis not present

## 2017-12-31 DIAGNOSIS — D631 Anemia in chronic kidney disease: Secondary | ICD-10-CM | POA: Diagnosis not present

## 2017-12-31 DIAGNOSIS — N186 End stage renal disease: Secondary | ICD-10-CM | POA: Diagnosis not present

## 2018-01-02 DIAGNOSIS — I129 Hypertensive chronic kidney disease with stage 1 through stage 4 chronic kidney disease, or unspecified chronic kidney disease: Secondary | ICD-10-CM | POA: Diagnosis not present

## 2018-01-02 DIAGNOSIS — Z992 Dependence on renal dialysis: Secondary | ICD-10-CM | POA: Diagnosis not present

## 2018-01-02 DIAGNOSIS — D631 Anemia in chronic kidney disease: Secondary | ICD-10-CM | POA: Diagnosis not present

## 2018-01-02 DIAGNOSIS — N186 End stage renal disease: Secondary | ICD-10-CM | POA: Diagnosis not present

## 2018-01-02 DIAGNOSIS — N2581 Secondary hyperparathyroidism of renal origin: Secondary | ICD-10-CM | POA: Diagnosis not present

## 2018-01-05 DIAGNOSIS — N186 End stage renal disease: Secondary | ICD-10-CM | POA: Diagnosis not present

## 2018-01-05 DIAGNOSIS — D631 Anemia in chronic kidney disease: Secondary | ICD-10-CM | POA: Diagnosis not present

## 2018-01-05 DIAGNOSIS — N2581 Secondary hyperparathyroidism of renal origin: Secondary | ICD-10-CM | POA: Diagnosis not present

## 2018-01-05 DIAGNOSIS — Z992 Dependence on renal dialysis: Secondary | ICD-10-CM | POA: Diagnosis not present

## 2018-01-06 IMAGING — CT CT ANGIO EXTREM UP*L*
2 of 11 series · 10 of 46 positions shown, 12 images · IV contrast (Omni 300)
Comparison: Chest radiograph performed 11/26/2016

CLINICAL DATA: Acute onset of left arm swelling and pain since
surgery last week. Assess the great vessels.

EXAM:
CT ANGIOGRAPHY CHEST WITH CONTRAST
CT ANGIOGRAPHY OF THE LEFT UPPER EXTREMITY
TECHNIQUE: Multidetector CT imaging of the chest and left upper extremity was
performed using the standard protocol during bolus administration of
intravenous contrast. Multiplanar CT image reconstructions and MIPs
were obtained to evaluate the vascular anatomy. The abdomen and
pelvis were also included in the field of view, due to limitations
in positioning of the left upper extremity.
CONTRAST:  100 mL of Isovue 370 IV contrast

[Series 6: pe thins · axial · 0.82mm/px · z∈[+948,+1507]mm · 9 of 699 slices shown, 11 images]
[im 70/699  soft-tissue]
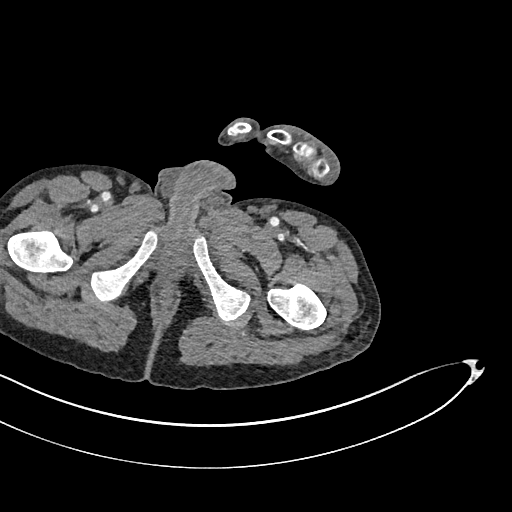
[im 70/699  bone]
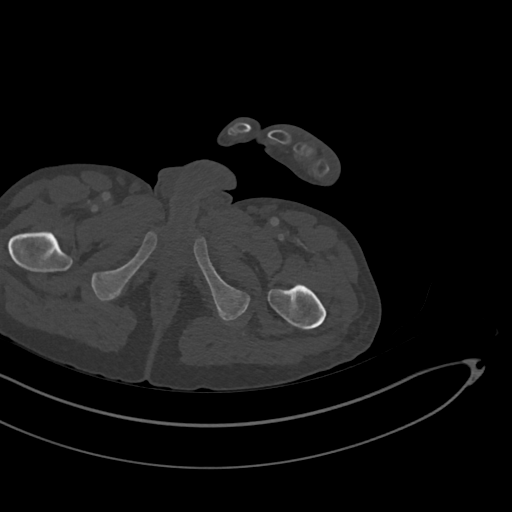
[im 140/699  soft-tissue]
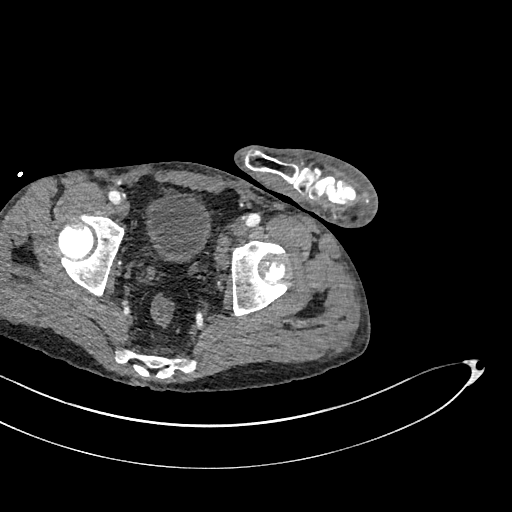
[im 210/699  soft-tissue]
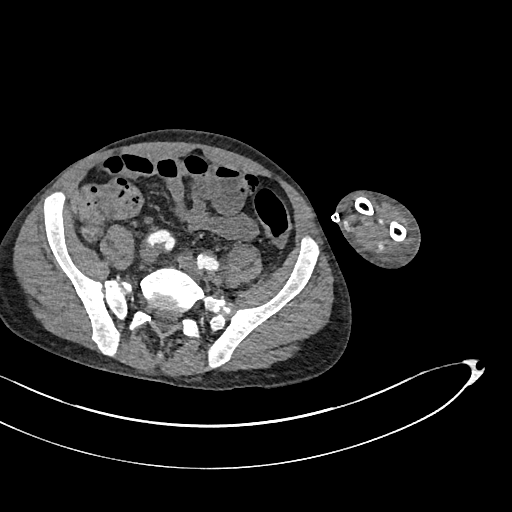
[im 280/699  soft-tissue]
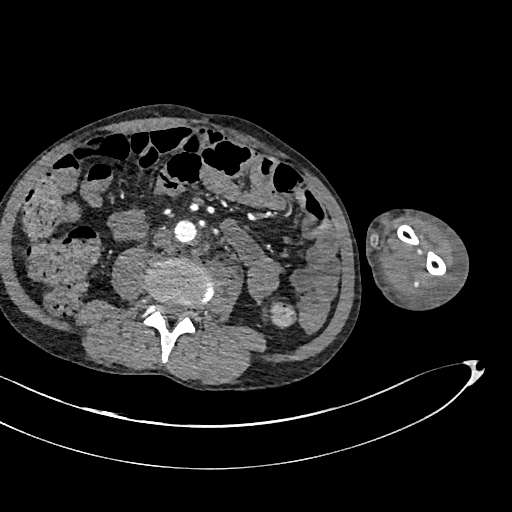
[im 350/699  soft-tissue]
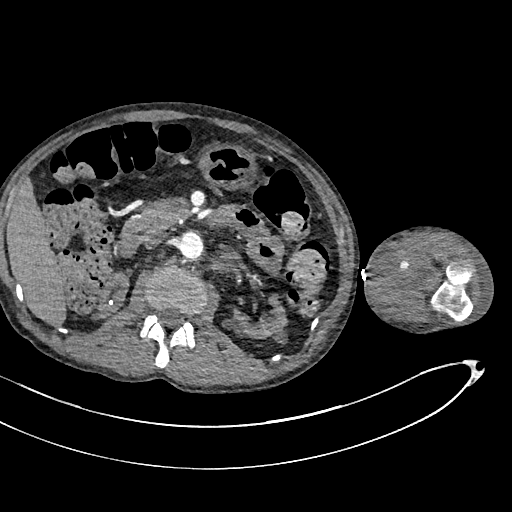
[im 419/699  soft-tissue]
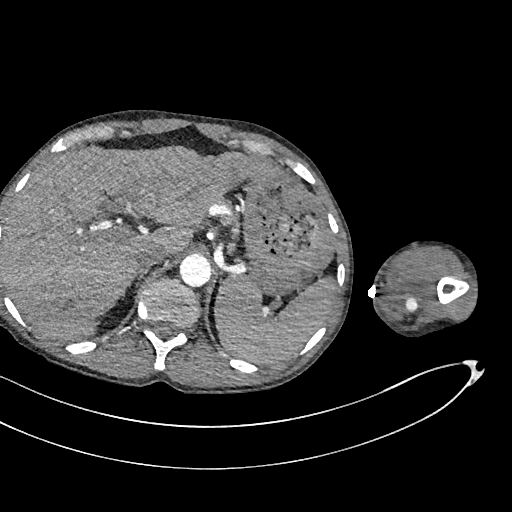
[im 489/699  soft-tissue]
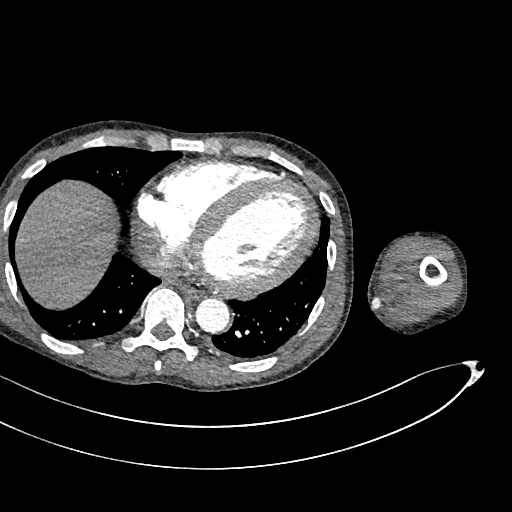
[im 559/699  soft-tissue]
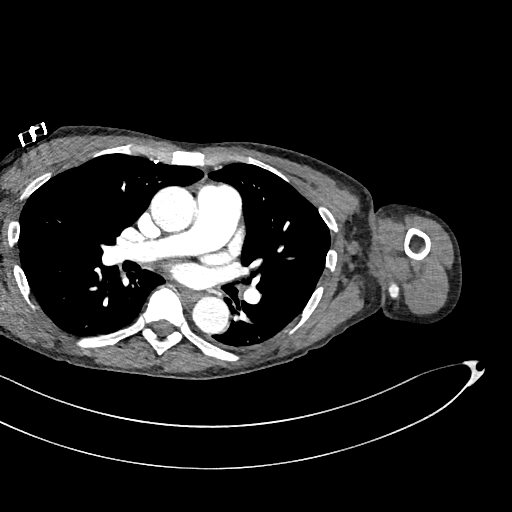
[im 629/699  soft-tissue]
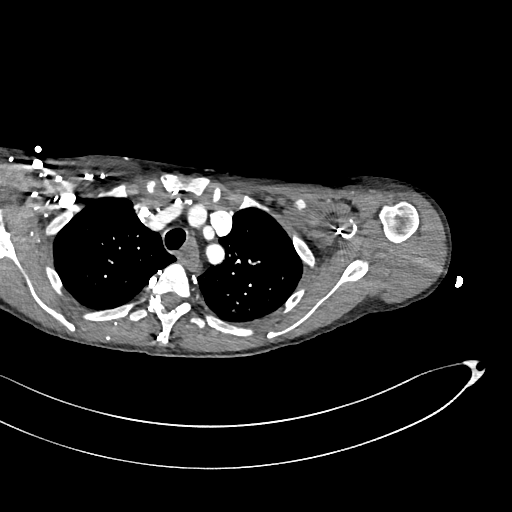
[im 629/699  bone]
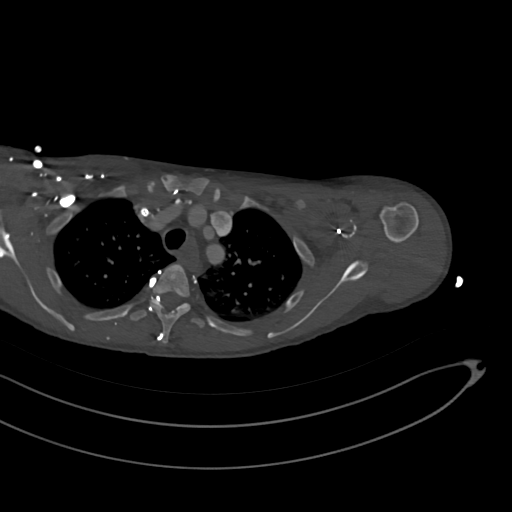

[Series 11: cta ue cor forearm · coronal · 0.34mm/px · 1 of 85 slices shown]
[im 43/85  soft-tissue]
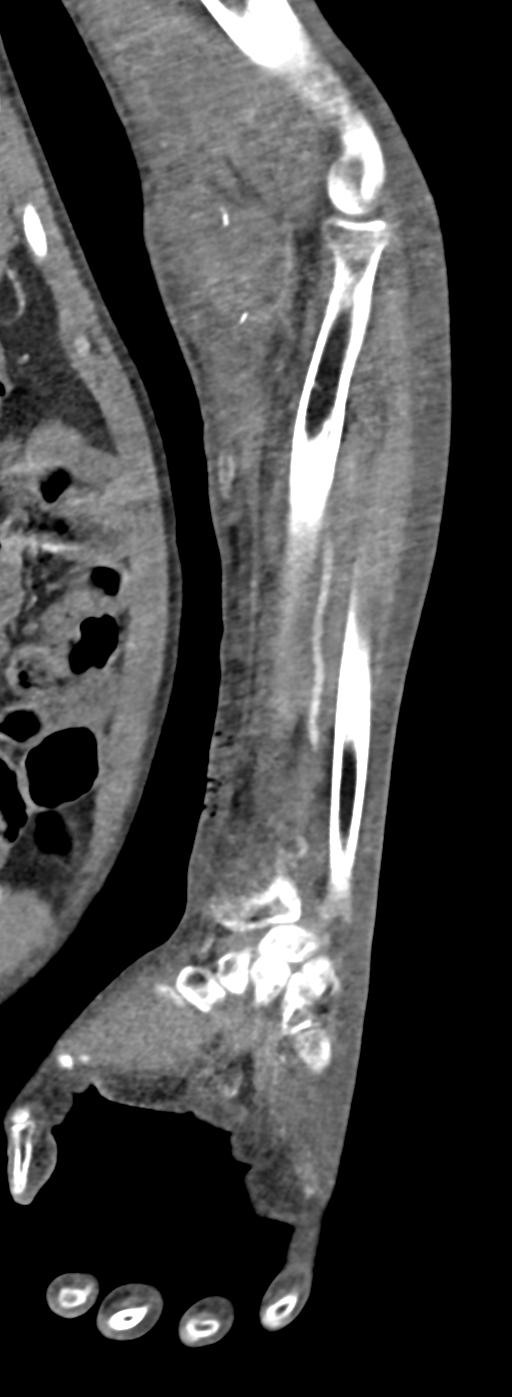

[10 of 46 positions shown; findings below may reference images not displayed]

FINDINGS: LEFT UPPER EXTREMITY FINDINGS:

The axillobrachial graft is discussed below in the Chest section.
Its distal anastomosis is fully patent, with mild ectasia at the
distal anastomosis.

The distal brachial artery remains fully patent, though somewhat
displaced by soft tissue edema and mild soft tissue hemorrhage along
the volar aspect of the forearm. Overlying skin staples are seen.
Patent ulnar and interosseous arteries are noted. There is only
minimal visualized enhancement along the radial artery, reflecting
the known chronic occlusive disease along the radial artery.

A chronically occluded radiocephalic fistula is again noted along
the mid forearm, with overlying skin staples reflecting recent
exploration. Diffuse soft tissue edema is seen tracking along the
forearm, more prominent dorsally.

No acute osseous abnormalities are seen along the left arm. No
well-defined fluid collection is seen to suggest abscess or
postoperative seroma.

CHEST FINDINGS:

Cardiovascular:  There is no evidence of pulmonary embolus.

The great vessels are unremarkable in appearance. The thoracic aorta
is unremarkable. The proximal left subclavian artery is unremarkable
in appearance.

At the left subclavian artery, note is again made of an
axillobrachial graft. There appears to be approximately 50%
narrowing of the proximal aspect of the graft. Per correlation with
recent operative note, this improved status post recent angioplasty.

The remainder of the axillobrachial graft appears fully patent,
without significant luminal narrowing. A chronically occluded left
axillary graft is again noted. Chronic soft tissue inflammation is
noted about the left axilla.

Mediastinum/Nodes: The heart is enlarged. No significant calcific
atherosclerotic disease is seen at the mediastinum. No mediastinal
lymphadenopathy is seen. No pericardial effusion is identified. The
thyroid gland is grossly unremarkable in appearance. No axillary
lymphadenopathy is seen.

A tunneled catheter is noted ending at the proximal right atrium.

Lungs/Pleura: Minimal bibasilar atelectasis noted. The lungs are
otherwise clear. No focal consolidation, pleural effusion or
pneumothorax is seen. No masses are identified.

Musculoskeletal: No acute osseous abnormalities are identified. The
visualized musculature is unremarkable in appearance.

ABDOMEN AND PELVIS FINDINGS:

VASCULAR

Aorta: The abdominal aorta is patent, without significant calcific
atherosclerotic disease. Vague soft tissue inflammation tracks about
the distal abdominal aorta, with mildly prominent adjacent nodes.
Would correlate for any evidence of aortitis.

Celiac:  The celiac trunk remains fully patent.

SMA: The superior mesenteric artery is fully patent and grossly
unremarkable in appearance.

Renals: There is apparent chronic occlusion of an aneurysm at the
proximal left renal artery, with surrounding soft inflammation, and
reconstitution distally. There is mild narrowing of the distal right
renal artery.

IMA: The inferior mesenteric artery remains patent, though difficult
to fully characterize.

Inflow: The common iliac arteries demonstrate mild scattered
calcification. The external and internal iliac arteries remain
patent. The common femoral arteries are patent bilaterally.

Veins: The inferior vena cava is grossly unremarkable in appearance.
No acute vascular abnormalities are seen, though evaluation of the
vasculature is limited given the phase of contrast enhancement.

NON-VASCULAR

Hepatobiliary: The nodular contour of the liver is compatible with
hepatic cirrhosis. Minimal soft tissue inflammation about the
gallbladder is nonspecific. Would correlate for any associated
symptoms. The common bile duct remains normal in caliber.

Pancreas: The pancreas is within normal limits.

Spleen: The spleen is unremarkable in appearance.

Adrenals/Urinary Tract: The adrenal glands are grossly unremarkable
in appearance.

Bilateral renal atrophy is noted, with scattered bilateral renal
cysts. Scattered nonobstructing bilateral renal stones are
identified. There is no evidence of hydronephrosis. No obstructing
ureteral stones are identified. Mild nonspecific perinephric
stranding is noted bilaterally.

Stomach/Bowel: The stomach is unremarkable in appearance. The small
bowel is within normal limits. The appendix is normal in caliber,
without evidence of appendicitis. The colon is unremarkable in
appearance.

Lymphatic: Mildly prominent retroperitoneal nodes are seen, as
described above. There appears to be a large 1.6 cm left pelvic
sidewall node. This raises concern for metastatic disease.

Reproductive: The bladder is mildly distended. Mild soft tissue
inflammation about the bladder could reflect cystitis. Would
correlate for any associated symptoms. The prostate remains normal
in size.

Other: No additional soft tissue abnormalities are seen.

Musculoskeletal: No acute osseous abnormalities are identified.
Avascular necrosis is noted at both femoral heads, with cortical
collapse at the left femoral head. The visualized musculature is
unremarkable in appearance.

Review of the MIP images confirms the above findings.
IMPRESSION: 1. [name] left axillobrachial graft remains patent. There
appears to be approximately 50% narrowing along the proximal aspect
of the graft, which has improved following angioplasty, per
operative note.
2. Distal brachial artery remains fully patent, though somewhat
displaced by soft tissue edema and mild soft tissue hemorrhage along
the volar aspect of the forearm. The left ulnar artery and
visualized interosseous artery branch appear patent. Only minimal
enhancement noted along the radial artery, reflecting known chronic
occlusive disease along the radial artery.
3. Diffuse soft tissue edema along the left forearm, more prominent
dorsally. No focal fluid collections seen at this time.
4. Vague soft tissue inflammation tracks about the distal abdominal
aorta, with mildly prominent adjacent retroperitoneal nodes. Would
correlate for any evidence of infectious or inflammatory aortitis.
The abdominal aorta remains patent.
5. Large 1.6 cm left pelvic sidewall node noted. This raises concern
for metastatic disease. Tissue diagnosis would be helpful.
6. Apparent chronic occlusion of an aneurysm at the proximal left
renal artery, with prominent surrounding soft tissue inflammation,
and distal reconstitution. This is of uncertain significance.
Underlying mass cannot be entirely excluded.
7. Mild soft tissue inflammation about the bladder could reflect
cystitis.
8. Nonspecific mild soft tissue inflammation suggested about the
gallbladder. Would correlate for any associated symptoms.
9. Avascular necrosis at both femoral heads, with cortical collapse
at the left femoral head.
10. Chronic soft tissue inflammation about the left axilla.
Chronically occluded left axillary graft and chronically occluded
left radiocephalic fistula again noted.
11. Bilateral renal atrophy, with scattered bilateral renal cysts
and nonobstructing bilateral renal stones.
12. Cardiomegaly noted.
13. Changes of hepatic cirrhosis.

## 2018-01-07 ENCOUNTER — Telehealth: Payer: Self-pay | Admitting: Vascular Surgery

## 2018-01-07 ENCOUNTER — Other Ambulatory Visit: Payer: Self-pay

## 2018-01-07 DIAGNOSIS — N2581 Secondary hyperparathyroidism of renal origin: Secondary | ICD-10-CM | POA: Diagnosis not present

## 2018-01-07 DIAGNOSIS — Z992 Dependence on renal dialysis: Secondary | ICD-10-CM | POA: Diagnosis not present

## 2018-01-07 DIAGNOSIS — I721 Aneurysm of artery of upper extremity: Secondary | ICD-10-CM

## 2018-01-07 DIAGNOSIS — D631 Anemia in chronic kidney disease: Secondary | ICD-10-CM | POA: Diagnosis not present

## 2018-01-07 DIAGNOSIS — Z01812 Encounter for preprocedural laboratory examination: Secondary | ICD-10-CM

## 2018-01-07 DIAGNOSIS — N186 End stage renal disease: Secondary | ICD-10-CM | POA: Diagnosis not present

## 2018-01-07 NOTE — Telephone Encounter (Signed)
Sched CTAs 01/13/18 at 2:00 at [facility]. Spoke to pt to inform them of appt and cta instructions. His chart says he has an allergic reaction to iodine contrast; however, when I spoke to the pt to give them the instructions for the 13 hr prep, he stated that he is NOT allergic to any contrast dye, that he previous reactions was overheating from nerves.  I called the imaging center at [facility] to inform them that the pt stated he is not allergic to contrast dye and will not be getting the 13 hr prep. They noted it on his cta appt.  Sent cta precert staff message.

## 2018-01-12 ENCOUNTER — Encounter (HOSPITAL_COMMUNITY): Payer: Self-pay | Admitting: *Deleted

## 2018-01-12 ENCOUNTER — Other Ambulatory Visit: Payer: Self-pay

## 2018-01-12 DIAGNOSIS — N2581 Secondary hyperparathyroidism of renal origin: Secondary | ICD-10-CM | POA: Diagnosis not present

## 2018-01-12 DIAGNOSIS — N186 End stage renal disease: Secondary | ICD-10-CM | POA: Diagnosis not present

## 2018-01-12 DIAGNOSIS — D631 Anemia in chronic kidney disease: Secondary | ICD-10-CM | POA: Diagnosis not present

## 2018-01-12 DIAGNOSIS — Z992 Dependence on renal dialysis: Secondary | ICD-10-CM | POA: Diagnosis not present

## 2018-01-12 NOTE — Progress Notes (Signed)
Denies chest pain or shortness of breath

## 2018-01-13 ENCOUNTER — Ambulatory Visit (HOSPITAL_COMMUNITY)
Admission: RE | Admit: 2018-01-13 | Discharge: 2018-01-13 | Disposition: A | Discharge status: Home / Self Care | Payer: Medicare Other | Source: Ambulatory Visit | Attending: Vascular Surgery | Admitting: Vascular Surgery

## 2018-01-13 DIAGNOSIS — Z01812 Encounter for preprocedural laboratory examination: Secondary | ICD-10-CM

## 2018-01-13 DIAGNOSIS — M7989 Other specified soft tissue disorders: Secondary | ICD-10-CM

## 2018-01-13 DIAGNOSIS — I721 Aneurysm of artery of upper extremity: Secondary | ICD-10-CM | POA: Insufficient documentation

## 2018-01-13 DIAGNOSIS — R600 Localized edema: Secondary | ICD-10-CM | POA: Diagnosis not present

## 2018-01-13 MED ORDER — IOPAMIDOL (ISOVUE-370) INJECTION 76%
INTRAVENOUS | Status: AC
  Administered 2018-01-13: 100 mL
  Filled 2018-01-13: qty 100

## 2018-01-14 ENCOUNTER — Encounter (HOSPITAL_COMMUNITY): Payer: Self-pay | Admitting: *Deleted

## 2018-01-14 ENCOUNTER — Encounter (HOSPITAL_COMMUNITY)
Admission: RE | Disposition: A | Discharge status: Home / Self Care | Payer: Self-pay | Source: Ambulatory Visit | Attending: Vascular Surgery

## 2018-01-14 ENCOUNTER — Inpatient Hospital Stay (HOSPITAL_COMMUNITY)
Admission: RE | Admit: 2018-01-14 | Discharge: 2018-01-15 | DRG: 252 | Disposition: A | Discharge status: Home / Self Care | Payer: Medicare Other | Source: Ambulatory Visit | Attending: Vascular Surgery | Admitting: Vascular Surgery

## 2018-01-14 ENCOUNTER — Other Ambulatory Visit: Payer: Self-pay

## 2018-01-14 ENCOUNTER — Ambulatory Visit (HOSPITAL_COMMUNITY): Payer: Medicare Other | Admitting: Certified Registered"

## 2018-01-14 DIAGNOSIS — Y832 Surgical operation with anastomosis, bypass or graft as the cause of abnormal reaction of the patient, or of later complication, without mention of misadventure at the time of the procedure: Secondary | ICD-10-CM | POA: Diagnosis present

## 2018-01-14 DIAGNOSIS — Z8673 Personal history of transient ischemic attack (TIA), and cerebral infarction without residual deficits: Secondary | ICD-10-CM

## 2018-01-14 DIAGNOSIS — I509 Heart failure, unspecified: Secondary | ICD-10-CM | POA: Diagnosis not present

## 2018-01-14 DIAGNOSIS — N186 End stage renal disease: Secondary | ICD-10-CM | POA: Diagnosis present

## 2018-01-14 DIAGNOSIS — K219 Gastro-esophageal reflux disease without esophagitis: Secondary | ICD-10-CM | POA: Diagnosis not present

## 2018-01-14 DIAGNOSIS — F17210 Nicotine dependence, cigarettes, uncomplicated: Secondary | ICD-10-CM | POA: Diagnosis present

## 2018-01-14 DIAGNOSIS — I120 Hypertensive chronic kidney disease with stage 5 chronic kidney disease or end stage renal disease: Secondary | ICD-10-CM | POA: Diagnosis not present

## 2018-01-14 DIAGNOSIS — I721 Aneurysm of artery of upper extremity: Secondary | ICD-10-CM | POA: Diagnosis not present

## 2018-01-14 DIAGNOSIS — T82848A Pain from vascular prosthetic devices, implants and grafts, initial encounter: Principal | ICD-10-CM | POA: Diagnosis present

## 2018-01-14 DIAGNOSIS — Z7982 Long term (current) use of aspirin: Secondary | ICD-10-CM

## 2018-01-14 DIAGNOSIS — Z79899 Other long term (current) drug therapy: Secondary | ICD-10-CM

## 2018-01-14 DIAGNOSIS — E7800 Pure hypercholesterolemia, unspecified: Secondary | ICD-10-CM | POA: Diagnosis present

## 2018-01-14 DIAGNOSIS — Z992 Dependence on renal dialysis: Secondary | ICD-10-CM | POA: Diagnosis not present

## 2018-01-14 DIAGNOSIS — E2740 Unspecified adrenocortical insufficiency: Secondary | ICD-10-CM | POA: Diagnosis not present

## 2018-01-14 DIAGNOSIS — I132 Hypertensive heart and chronic kidney disease with heart failure and with stage 5 chronic kidney disease, or end stage renal disease: Secondary | ICD-10-CM | POA: Diagnosis not present

## 2018-01-14 DIAGNOSIS — T829XXA Unspecified complication of cardiac and vascular prosthetic device, implant and graft, initial encounter: Secondary | ICD-10-CM | POA: Diagnosis not present

## 2018-01-14 DIAGNOSIS — D631 Anemia in chronic kidney disease: Secondary | ICD-10-CM | POA: Diagnosis not present

## 2018-01-14 DIAGNOSIS — N2581 Secondary hyperparathyroidism of renal origin: Secondary | ICD-10-CM | POA: Diagnosis not present

## 2018-01-14 HISTORY — PX: VEIN HARVEST: SHX6363

## 2018-01-14 HISTORY — PX: FALSE ANEURYSM REPAIR: SHX5152

## 2018-01-14 LAB — POCT I-STAT 4, (NA,K, GLUC, HGB,HCT)
Glucose, Bld: 71 mg/dL (ref 65–99)
HEMATOCRIT: 27 % — AB (ref 39.0–52.0)
Hemoglobin: 9.2 g/dL — ABNORMAL LOW (ref 13.0–17.0)
Potassium: 5.8 mmol/L — ABNORMAL HIGH (ref 3.5–5.1)
Sodium: 135 mmol/L (ref 135–145)

## 2018-01-14 SURGERY — REPAIR, PSEUDOANEURYSM
Anesthesia: General | Laterality: Left

## 2018-01-14 MED ORDER — CARVEDILOL 25 MG PO TABS
25.0000 mg | ORAL_TABLET | Freq: Two times a day (BID) | ORAL | Status: DC
  Administered 2018-01-14: 25 mg via ORAL
  Filled 2018-01-14 (×2): qty 1

## 2018-01-14 MED ORDER — ACETAMINOPHEN 325 MG RE SUPP: 325.0000 mg | RECTAL | Status: DC | PRN

## 2018-01-14 MED ORDER — GLYCOPYRROLATE 0.2 MG/ML IV SOSY
PREFILLED_SYRINGE | INTRAVENOUS | Status: DC | PRN
  Administered 2018-01-14: .4 mg via INTRAVENOUS

## 2018-01-14 MED ORDER — FENTANYL CITRATE (PF) 250 MCG/5ML IJ SOLN
INTRAMUSCULAR | Status: DC | PRN
  Administered 2018-01-14: 100 ug via INTRAVENOUS
  Administered 2018-01-14 (×2): 50 ug via INTRAVENOUS

## 2018-01-14 MED ORDER — OXYCODONE-ACETAMINOPHEN 5-325 MG PO TABS
1.0000 | ORAL_TABLET | ORAL | Status: DC | PRN
  Administered 2018-01-14 – 2018-01-15 (×2): 2 via ORAL
  Filled 2018-01-14 (×2): qty 2

## 2018-01-14 MED ORDER — CISATRACURIUM BESYLATE 20 MG/10ML IV SOLN
INTRAVENOUS | Status: AC
  Filled 2018-01-14: qty 20

## 2018-01-14 MED ORDER — ROCURONIUM BROMIDE 10 MG/ML (PF) SYRINGE
PREFILLED_SYRINGE | INTRAVENOUS | Status: AC
  Filled 2018-01-14: qty 5

## 2018-01-14 MED ORDER — MIDAZOLAM HCL 2 MG/2ML IJ SOLN: 0.5000 mg | Freq: Once | INTRAMUSCULAR | Status: DC | PRN

## 2018-01-14 MED ORDER — PROMETHAZINE HCL 25 MG/ML IJ SOLN
6.2500 mg | INTRAMUSCULAR | Status: DC | PRN
  Administered 2018-01-14: 6.25 mg via INTRAVENOUS

## 2018-01-14 MED ORDER — ONDANSETRON HCL 4 MG/2ML IJ SOLN: 4.0000 mg | Freq: Four times a day (QID) | INTRAMUSCULAR | Status: DC | PRN

## 2018-01-14 MED ORDER — NEOSTIGMINE METHYLSULFATE 5 MG/5ML IV SOSY
PREFILLED_SYRINGE | INTRAVENOUS | Status: DC | PRN
  Administered 2018-01-14: 3 mg via INTRAVENOUS

## 2018-01-14 MED ORDER — POTASSIUM CHLORIDE CRYS ER 20 MEQ PO TBCR
20.0000 meq | EXTENDED_RELEASE_TABLET | Freq: Once | ORAL | Status: AC
  Administered 2018-01-14: 20 meq via ORAL
  Filled 2018-01-14: qty 1

## 2018-01-14 MED ORDER — STERILE WATER FOR INJECTION IJ SOLN
INTRAMUSCULAR | Status: DC | PRN
  Administered 2018-01-14: 1000 mL via SURGICAL_CAVITY

## 2018-01-14 MED ORDER — PROMETHAZINE HCL 25 MG/ML IJ SOLN
INTRAMUSCULAR | Status: AC
  Filled 2018-01-14: qty 1

## 2018-01-14 MED ORDER — ONDANSETRON HCL 4 MG/2ML IJ SOLN
INTRAMUSCULAR | Status: DC | PRN
  Administered 2018-01-14: 4 mg via INTRAVENOUS

## 2018-01-14 MED ORDER — PROTAMINE SULFATE 10 MG/ML IV SOLN
INTRAVENOUS | Status: DC | PRN
  Administered 2018-01-14: 50 mg via INTRAVENOUS

## 2018-01-14 MED ORDER — FENTANYL CITRATE (PF) 100 MCG/2ML IJ SOLN: 25.0000 ug | INTRAMUSCULAR | Status: DC | PRN

## 2018-01-14 MED ORDER — HYDRALAZINE HCL 50 MG PO TABS
100.0000 mg | ORAL_TABLET | Freq: Three times a day (TID) | ORAL | Status: DC
  Administered 2018-01-14: 100 mg via ORAL
  Filled 2018-01-14: qty 2

## 2018-01-14 MED ORDER — SODIUM CHLORIDE 0.9 % IV SOLN: INTRAVENOUS | Status: DC

## 2018-01-14 MED ORDER — MORPHINE SULFATE (PF) 2 MG/ML IV SOLN
2.0000 mg | INTRAVENOUS | Status: DC | PRN
  Administered 2018-01-15 (×2): 2 mg via INTRAVENOUS
  Filled 2018-01-14 (×3): qty 1

## 2018-01-14 MED ORDER — LIDOCAINE HCL (CARDIAC) 20 MG/ML IV SOLN
INTRAVENOUS | Status: AC
  Filled 2018-01-14: qty 5

## 2018-01-14 MED ORDER — LABETALOL HCL 5 MG/ML IV SOLN: 10.0000 mg | INTRAVENOUS | Status: DC | PRN

## 2018-01-14 MED ORDER — ASPIRIN EC 81 MG PO TBEC: 81.0000 mg | DELAYED_RELEASE_TABLET | Freq: Every day | ORAL | Status: DC

## 2018-01-14 MED ORDER — HYDRALAZINE HCL 20 MG/ML IJ SOLN: 5.0000 mg | INTRAMUSCULAR | Status: DC | PRN

## 2018-01-14 MED ORDER — SODIUM CHLORIDE 0.9 % IV SOLN
INTRAVENOUS | Status: DC
  Administered 2018-01-14 (×2): via INTRAVENOUS

## 2018-01-14 MED ORDER — CEFAZOLIN SODIUM-DEXTROSE 2-4 GM/100ML-% IV SOLN
2.0000 g | INTRAVENOUS | Status: AC
  Administered 2018-01-14: 2 g via INTRAVENOUS

## 2018-01-14 MED ORDER — CEFAZOLIN SODIUM-DEXTROSE 1-4 GM/50ML-% IV SOLN
1.0000 g | Freq: Three times a day (TID) | INTRAVENOUS | Status: AC
  Administered 2018-01-15: 1 g via INTRAVENOUS
  Filled 2018-01-14: qty 50

## 2018-01-14 MED ORDER — SOD CITRATE-CITRIC ACID 500-334 MG/5ML PO SOLN
30.0000 mL | Freq: Once | ORAL | Status: AC
  Administered 2018-01-14: 30 mL via ORAL
  Filled 2018-01-14: qty 30
  Filled 2018-01-14: qty 15
  Filled 2018-01-14: qty 30

## 2018-01-14 MED ORDER — CEFAZOLIN SODIUM-DEXTROSE 2-4 GM/100ML-% IV SOLN
INTRAVENOUS | Status: AC
  Filled 2018-01-14: qty 100

## 2018-01-14 MED ORDER — ATORVASTATIN CALCIUM 40 MG PO TABS
40.0000 mg | ORAL_TABLET | Freq: Every day | ORAL | Status: DC
  Administered 2018-01-14: 40 mg via ORAL
  Filled 2018-01-14: qty 1

## 2018-01-14 MED ORDER — SODIUM CHLORIDE 0.9 % IV SOLN
INTRAVENOUS | Status: DC | PRN
  Administered 2018-01-14: 16:00:00

## 2018-01-14 MED ORDER — PANTOPRAZOLE SODIUM 40 MG PO TBEC
40.0000 mg | DELAYED_RELEASE_TABLET | Freq: Every day | ORAL | Status: DC
  Administered 2018-01-14: 40 mg via ORAL
  Filled 2018-01-14: qty 1

## 2018-01-14 MED ORDER — CHLORHEXIDINE GLUCONATE 4 % EX LIQD: 60.0000 mL | Freq: Once | CUTANEOUS | Status: DC

## 2018-01-14 MED ORDER — MIDAZOLAM HCL 5 MG/5ML IJ SOLN
INTRAMUSCULAR | Status: DC | PRN
  Administered 2018-01-14: 1 mg via INTRAVENOUS

## 2018-01-14 MED ORDER — FENTANYL CITRATE (PF) 250 MCG/5ML IJ SOLN
INTRAMUSCULAR | Status: AC
  Filled 2018-01-14: qty 5

## 2018-01-14 MED ORDER — DEXAMETHASONE SODIUM PHOSPHATE 10 MG/ML IJ SOLN
INTRAMUSCULAR | Status: DC | PRN
  Administered 2018-01-14: 5 mg via INTRAVENOUS

## 2018-01-14 MED ORDER — MEPERIDINE HCL 50 MG/ML IJ SOLN: 6.2500 mg | INTRAMUSCULAR | Status: DC | PRN

## 2018-01-14 MED ORDER — HEPARIN SODIUM (PORCINE) 1000 UNIT/ML IJ SOLN
INTRAMUSCULAR | Status: DC | PRN
  Administered 2018-01-14: 6000 [IU] via INTRAVENOUS

## 2018-01-14 MED ORDER — HEPARIN SODIUM (PORCINE) 1000 UNIT/ML IJ SOLN
INTRAMUSCULAR | Status: AC
  Filled 2018-01-14: qty 1

## 2018-01-14 MED ORDER — DEXAMETHASONE SODIUM PHOSPHATE 10 MG/ML IJ SOLN
INTRAMUSCULAR | Status: AC
  Filled 2018-01-14: qty 1

## 2018-01-14 MED ORDER — PHENOL 1.4 % MT LIQD: 1.0000 | OROMUCOSAL | Status: DC | PRN

## 2018-01-14 MED ORDER — PHENYLEPHRINE HCL 10 MG/ML IJ SOLN
INTRAVENOUS | Status: DC | PRN
  Administered 2018-01-14: 15 ug/min via INTRAVENOUS

## 2018-01-14 MED ORDER — NEOSTIGMINE METHYLSULFATE 5 MG/5ML IV SOSY
PREFILLED_SYRINGE | INTRAVENOUS | Status: AC
  Filled 2018-01-14: qty 5

## 2018-01-14 MED ORDER — LACTULOSE 10 GM/15ML PO SOLN: 20.0000 g | Freq: Every day | ORAL | Status: DC | PRN

## 2018-01-14 MED ORDER — ACETAMINOPHEN 325 MG PO TABS: 325.0000 mg | ORAL_TABLET | ORAL | Status: DC | PRN

## 2018-01-14 MED ORDER — PROPOFOL 10 MG/ML IV BOLUS
INTRAVENOUS | Status: DC | PRN
  Administered 2018-01-14: 100 mg via INTRAVENOUS

## 2018-01-14 MED ORDER — GUAIFENESIN-DM 100-10 MG/5ML PO SYRP: 15.0000 mL | ORAL_SOLUTION | ORAL | Status: DC | PRN

## 2018-01-14 MED ORDER — LIDOCAINE 2% (20 MG/ML) 5 ML SYRINGE
INTRAMUSCULAR | Status: DC | PRN
  Administered 2018-01-14: 60 mg via INTRAVENOUS

## 2018-01-14 MED ORDER — LOSARTAN POTASSIUM 50 MG PO TABS
100.0000 mg | ORAL_TABLET | Freq: Every day | ORAL | Status: DC
  Administered 2018-01-14: 100 mg via ORAL
  Filled 2018-01-14: qty 2

## 2018-01-14 MED ORDER — ONDANSETRON HCL 4 MG/2ML IJ SOLN
INTRAMUSCULAR | Status: AC
  Filled 2018-01-14: qty 4

## 2018-01-14 MED ORDER — HYDROCORTISONE 5 MG PO TABS
5.0000 mg | ORAL_TABLET | Freq: Two times a day (BID) | ORAL | Status: DC
  Filled 2018-01-14 (×2): qty 1

## 2018-01-14 MED ORDER — CISATRACURIUM BESYLATE (PF) 10 MG/5ML IV SOLN
INTRAVENOUS | Status: DC | PRN
  Administered 2018-01-14: 8 mg via INTRAVENOUS

## 2018-01-14 MED ORDER — METOPROLOL TARTRATE 5 MG/5ML IV SOLN: 2.0000 mg | INTRAVENOUS | Status: DC | PRN

## 2018-01-14 MED ORDER — MIDAZOLAM HCL 2 MG/2ML IJ SOLN
INTRAMUSCULAR | Status: AC
  Filled 2018-01-14: qty 2

## 2018-01-14 MED ORDER — 0.9 % SODIUM CHLORIDE (POUR BTL) OPTIME
TOPICAL | Status: DC | PRN
  Administered 2018-01-14: 1000 mL

## 2018-01-14 SURGICAL SUPPLY — 55 items
ADH SKN CLS APL DERMABOND .7 (GAUZE/BANDAGES/DRESSINGS) ×3
BANDAGE ACE 6X5 VEL STRL LF (GAUZE/BANDAGES/DRESSINGS) ×1 IMPLANT
BANDAGE ESMARK 6X9 LF (GAUZE/BANDAGES/DRESSINGS) IMPLANT
BNDG CMPR 9X6 STRL LF SNTH (GAUZE/BANDAGES/DRESSINGS) ×1
BNDG ESMARK 6X9 LF (GAUZE/BANDAGES/DRESSINGS) ×2
BNDG GAUZE ELAST 4 BULKY (GAUZE/BANDAGES/DRESSINGS) ×1 IMPLANT
CANISTER SUCT 3000ML PPV (MISCELLANEOUS) ×2 IMPLANT
CANNULA VESSEL 3MM 2 BLNT TIP (CANNULA) IMPLANT
CLIP LIGATING EXTRA MED SLVR (CLIP) ×2 IMPLANT
CLIP LIGATING EXTRA SM BLUE (MISCELLANEOUS) ×2 IMPLANT
CUFF TOURNIQUET SINGLE 18IN (TOURNIQUET CUFF) ×1 IMPLANT
CUFF TOURNIQUET SINGLE 24IN (TOURNIQUET CUFF) IMPLANT
CUFF TOURNIQUET SINGLE 34IN LL (TOURNIQUET CUFF) IMPLANT
CUFF TOURNIQUET SINGLE 44IN (TOURNIQUET CUFF) IMPLANT
DERMABOND ADVANCED (GAUZE/BANDAGES/DRESSINGS) ×3
DERMABOND ADVANCED .7 DNX12 (GAUZE/BANDAGES/DRESSINGS) ×1 IMPLANT
DRAIN SNY 10X20 3/4 PERF (WOUND CARE) IMPLANT
DRAPE ORTHO SPLIT 77X108 STRL (DRAPES) ×2
DRAPE SURG ORHT 6 SPLT 77X108 (DRAPES) IMPLANT
DRAPE X-RAY CASS 24X20 (DRAPES) IMPLANT
ELECT REM PT RETURN 9FT ADLT (ELECTROSURGICAL) ×2
ELECTRODE REM PT RTRN 9FT ADLT (ELECTROSURGICAL) ×1 IMPLANT
EVACUATOR SILICONE 100CC (DRAIN) IMPLANT
GLOVE BIO SURGEON STRL SZ 6.5 (GLOVE) ×3 IMPLANT
GLOVE ECLIPSE 7.5 STRL STRAW (GLOVE) ×1 IMPLANT
GLOVE SS BIOGEL STRL SZ 7.5 (GLOVE) ×1 IMPLANT
GLOVE SUPERSENSE BIOGEL SZ 7.5 (GLOVE) ×1
GOWN STRL REUS W/ TWL LRG LVL3 (GOWN DISPOSABLE) ×3 IMPLANT
GOWN STRL REUS W/TWL LRG LVL3 (GOWN DISPOSABLE) ×6
KIT BASIN OR (CUSTOM PROCEDURE TRAY) ×2 IMPLANT
KIT ROOM TURNOVER OR (KITS) ×2 IMPLANT
NS IRRIG 1000ML POUR BTL (IV SOLUTION) ×4 IMPLANT
PACK PERIPHERAL VASCULAR (CUSTOM PROCEDURE TRAY) ×2 IMPLANT
PAD ARMBOARD 7.5X6 YLW CONV (MISCELLANEOUS) ×4 IMPLANT
PADDING CAST COTTON 6X4 STRL (CAST SUPPLIES) IMPLANT
SET COLLECT BLD 21X3/4 12 (NEEDLE) IMPLANT
SPONGE LAP 18X18 RF (DISPOSABLE) ×1 IMPLANT
STAPLER VISISTAT 35W (STAPLE) IMPLANT
STOCKINETTE 6  STRL (DRAPES) ×1
STOCKINETTE 6 STRL (DRAPES) IMPLANT
STOPCOCK 4 WAY LG BORE MALE ST (IV SETS) IMPLANT
SUT ETHILON 3 0 PS 1 (SUTURE) IMPLANT
SUT PROLENE 5 0 C 1 24 (SUTURE) ×3 IMPLANT
SUT PROLENE 6 0 CC (SUTURE) ×5 IMPLANT
SUT SILK 3 0 (SUTURE) ×2
SUT SILK 3-0 18XBRD TIE 12 (SUTURE) IMPLANT
SUT VIC AB 2-0 CTX 36 (SUTURE) ×2 IMPLANT
SUT VIC AB 3-0 SH 27 (SUTURE) ×4
SUT VIC AB 3-0 SH 27X BRD (SUTURE) ×1 IMPLANT
SUT VIC AB 4-0 PS2 18 (SUTURE) ×1 IMPLANT
TOWEL GREEN STERILE (TOWEL DISPOSABLE) ×2 IMPLANT
TRAY FOLEY MTR SLVR 16FR STAT (CATHETERS) ×1 IMPLANT
TUBING EXTENTION W/L.L. (IV SETS) IMPLANT
UNDERPAD 30X30 (UNDERPADS AND DIAPERS) ×2 IMPLANT
WATER STERILE IRR 1000ML POUR (IV SOLUTION) ×2 IMPLANT

## 2018-01-14 NOTE — Progress Notes (Signed)
Pt arrived to the floor with the PACU nurse, drowsy and did not want to be bothered. Pt refuses the CHG bath, and not cooperative to care.

## 2018-01-14 NOTE — Transfer of Care (Signed)
Immediate Anesthesia Transfer of Care Note  Patient: Marvin Roberson  Procedure(s) Performed: REPAIR FALSE ANEURYSM LEFT BRACHIAL ARTERY WITH SAPHENOUS VEIN  LEFT ARTERIOVENOUS GRAFT (Left ) VEIN HARVEST LEFT SAPHENOUS VEIN (Left )  Patient Location: PACU  Anesthesia Type:General  Level of Consciousness: drowsy  Airway & Oxygen Therapy: Patient Spontanous Breathing and Patient connected to nasal cannula oxygen  Post-op Assessment: Report given to RN and Post -op Vital signs reviewed and stable  Post vital signs: Reviewed and stable  Last Vitals:  Vitals:   01/14/18 0903  BP: (!) 151/79  Pulse: 78  Resp: 18  Temp: 36.8 C  SpO2: 100%    Last Pain:  Vitals:   01/14/18 0903  TempSrc: Oral      Patients Stated Pain Goal: 4 (83/35/82 5189)  Complications: No apparent anesthesia complications

## 2018-01-14 NOTE — Interval H&P Note (Signed)
History and Physical Interval Note:  01/14/2018 11:07 AM  Marvin Roberson  has presented today for surgery, with the diagnosis of Complication with Graft  The various methods of treatment have been discussed with the patient and family. After consideration of risks, benefits and other options for treatment, the patient has consented to  Procedure(s): REPAIR FALSE ANEURYSM LEFT BRACHIAL ARTERY WITH SAPHENOUS VEIN  LEFT ARTERIOVENOUS GRAFT (Left) VEIN HARVEST LEFT SAPHENOUS VEIN (Left) as a surgical intervention .  [name] history has been reviewed, patient examined, no change in status, stable for surgery.  I have reviewed the patient's chart and labs.  Questions were answered to the patient's satisfaction.     Curt Jews

## 2018-01-14 NOTE — Anesthesia Procedure Notes (Signed)
Procedure Name: Intubation Date/Time: 01/14/2018 2:56 PM Performed by: Imagene Riches, CRNA Pre-anesthesia Checklist: Patient identified, Emergency Drugs available, Suction available and Patient being monitored Patient Re-evaluated:Patient Re-evaluated prior to induction Oxygen Delivery Method: Circle System Utilized Preoxygenation: Pre-oxygenation with 100% oxygen Induction Type: IV induction Ventilation: Mask ventilation without difficulty and Oral airway inserted - appropriate to patient size Laryngoscope Size: Miller and 3 Grade View: Grade II Tube type: Oral Tube size: 7.5 mm Number of attempts: 2 Airway Equipment and Method: Stylet and Oral airway Placement Confirmation: ETT inserted through vocal cords under direct vision,  positive ETCO2 and breath sounds checked- equal and bilateral Secured at: 23 cm Tube secured with: Tape Dental Injury: Teeth and Oropharynx as per pre-operative assessment  Comments: First DL with Miller 2. Grade 3 view. Second DL with miller 2. Grade 2 view. ETT easily passed through cords.

## 2018-01-14 NOTE — Plan of Care (Signed)
progressing 

## 2018-01-14 NOTE — Anesthesia Preprocedure Evaluation (Addendum)
Anesthesia Evaluation  Patient identified by MRN, date of birth, ID band Patient awake    Reviewed: Allergy & Precautions, NPO status , Patient's Chart, lab work & pertinent test results, reviewed documented beta blocker date and time   History of Anesthesia Complications Negative for: history of anesthetic complications  Airway Mallampati: II  TM Distance: >3 FB Neck ROM: Full    Dental  (+) Dental Advisory Given   Pulmonary Current Smoker,    breath sounds clear to auscultation       Cardiovascular hypertension, Pt. on medications and Pt. on home beta blockers (-) angina+CHF   Rhythm:Regular Rate:Normal  '15 ECHO: Left ventricular systolic function is moderately reduced. LV ejection fraction = 30-35%. Mod TR   Neuro/Psych negative neurological ROS     GI/Hepatic Neg liver ROS, GERD  Medicated and Controlled,  Endo/Other  H/o adrenal insufficiency: steroids  Renal/GU Dialysis and ESRFRenal disease (MWF, K+ 5.8)     Musculoskeletal   Abdominal   Peds  Hematology negative hematology ROS (+)   Anesthesia Other Findings   Reproductive/Obstetrics                           Anesthesia Physical Anesthesia Plan  ASA: III  Anesthesia Plan: General   Post-op Pain Management:    Induction: Intravenous  PONV Risk Score and Plan: 1 and Ondansetron  Airway Management Planned: Oral ETT  Additional Equipment:   Intra-op Plan:   Post-operative Plan: Extubation in OR  Informed Consent: I have reviewed the patients History and Physical, chart, labs and discussed the procedure including the risks, benefits and alternatives for the proposed anesthesia with the patient or authorized representative who has indicated his/her understanding and acceptance.   Dental advisory given  Plan Discussed with: CRNA and Surgeon  Anesthesia Plan Comments: (Plan routine monitors, GETA)       Anesthesia  Quick Evaluation

## 2018-01-14 NOTE — Op Note (Signed)
    OPERATIVE REPORT  DATE OF SURGERY: 01/14/2018  PATIENT: Marvin Roberson, 47 y.o. 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  PHYSICIAN ASSISTANT: Samantha Rhyne PA-C  ANESTHESIA: General  EBL: 350 ml  Total I/O In: 700 [I.V.:700] Out: 600 [Urine:250; Blood:350]  BLOOD ADMINISTERED: None  DRAINS: None  SPECIMEN: None  COUNTS CORRECT:  YES  PLAN OF CARE: PACU  PATIENT DISPOSITION:  PACU - hemodynamically stable  PROCEDURE DETAILS: The patient was taken to the operative placed supine position where the area the left arm was prepped and draped in usual sterile fashion in the left thigh was prepped and draped in usual sterile fashion.  SonoSite ultrasound was used to visualize the cephalic vein which was of good caliber.  Incision was made through a prior scar beginning above the antecubital space and continuing down below the antecubital space over the brachial artery and false aneurysm.  The patient has had prior resection of aneurysm at this level and extensive prior surgeries as documented in his chart.  He had developed a false aneurysm which was very painful to him from an artery graft present.  The native brachial artery was large and was encircled above the area of the aneurysm.  The aneurysm was densely adherent in scar and this was all resected.  This was carried down to the distal anastomosis.  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  The saphenous vein was then harvested from the groin to mid thigh of excellent caliber.  Tributary branches were ligated with 3-0 and 4-0 silk ties and divided.  The vein was gently dilated and was of good caliber.  The brachial artery was occluded after  giving 7000 units of intravenous heparin.  The aneurysm was opened and had a great deal of mural thrombus present.  The brachial artery was transected above the antecubital space and the entire aneurysm was resected.  The vein was reversed and was spatulated and sewn into into the brachial artery with a running 5-0 Prolene suture.  This anastomosis was tested and found to be adequate.  Next the graft was sewn into into the junction of the radial and ulnar artery.  This was with a 5-0 Prolene as well.  Prior to completion of the closure the usual flushing maneuvers were undertaken.  Anastomosis completed and flow Doppler flow is noted at the ulnar artery at the wrist.  The patient was given 50 mg of protamine to reverse heparin.  Wounds irrigated with saline.  Hemostasis was cautery.  The thigh wounds were closed with 3-0 Vicryl in the subcutaneous tissue and 4-0 subcuticular Vicryl the arm incision was closed with 3 oh and several layers of articular tissue and the skin was closed with a 3-0 Vicryl suture.  Sterile dressing was applied patient was transferred to the recovery room in stable condition   Marvin Roberson, M.D., [facility] 01/14/2018 6:42 PM

## 2018-01-15 ENCOUNTER — Encounter (HOSPITAL_COMMUNITY): Payer: Self-pay | Admitting: Vascular Surgery

## 2018-01-15 DIAGNOSIS — N2581 Secondary hyperparathyroidism of renal origin: Secondary | ICD-10-CM | POA: Diagnosis not present

## 2018-01-15 DIAGNOSIS — D631 Anemia in chronic kidney disease: Secondary | ICD-10-CM | POA: Diagnosis not present

## 2018-01-15 DIAGNOSIS — Z992 Dependence on renal dialysis: Secondary | ICD-10-CM | POA: Diagnosis not present

## 2018-01-15 DIAGNOSIS — N186 End stage renal disease: Secondary | ICD-10-CM | POA: Diagnosis not present

## 2018-01-15 MED ORDER — OXYCODONE-ACETAMINOPHEN 5-325 MG PO TABS: 1.0000 | ORAL_TABLET | Freq: Four times a day (QID) | ORAL | 0 refills | Status: DC | PRN

## 2018-01-15 NOTE — Progress Notes (Signed)
Mr. Economou is ready for discharge. He has all of his belongings and has had all of his questions regarding his discharge satisfied. Patient VS are stable and he is alert and oriented X 4. IV has been removed with no complications and catheter was intact. Patient was DC'd from the telemetry monitor and the Telemetry Box has been removed. Patient will be leaving the unit by wheelchair and a taxi voucher has been provided by [facility]. The nurse will take patient by wheelchair to the front entrance and wait for taxi cab service to pick patient up and transport him to [address], [place], [place].

## 2018-01-15 NOTE — Discharge Instructions (Signed)
[facility]  Discharge Instructions  AV Fistula or Graft Surgery for Dialysis Access  Please refer to the following instructions for your post-procedure care. Your surgeon or physician assistant will discuss any changes with you.  Activity  You may drive the day following your surgery, if you are comfortable and no longer taking prescription pain medication. Resume full activity as the soreness in your incision resolves.  Bathing/Showering  You may shower after you go home. Keep your incision dry for 48 hours. Do not soak in a bathtub, hot tub, or swim until the incision heals completely. You may not shower if you have a hemodialysis catheter.  Incision Care  Clean your incision with mild soap and water after 48 hours. Pat the area dry with a clean towel. You do not need a bandage unless otherwise instructed. Do not apply any ointments or creams to your incision. You may have skin glue on your incision. Do not peel it off. It will come off on its own in about one week. Your arm may swell a bit after surgery. To reduce swelling use pillows to elevate your arm so it is above your heart. Your doctor will tell you if you need to lightly wrap your arm with an ACE bandage.  Remove bandage in 24-48 hours and then wash wound with soap and water ONLY.   DO NOT USE ANYTHING ELSE ON YOUR INCISION EXCEPT SOAP AND WATER.  DO NOT USE ANY TYPE OF OINTMENTS.  Diet  Resume your normal diet. There are not special food restrictions following this procedure. In order to heal from your surgery, it is CRITICAL to get adequate nutrition. Your body requires vitamins, minerals, and protein. Vegetables are the best source of vitamins and minerals. Vegetables also provide the perfect balance of protein. Processed food has little nutritional value, so try to avoid this.  Medications  Resume taking all of your medications. If your incision is causing pain, you may take over-the  counter pain relievers such as acetaminophen (Tylenol). If you were prescribed a stronger pain medication, please be aware these medications can cause nausea and constipation. Prevent nausea by taking the medication with a snack or meal. Avoid constipation by drinking plenty of fluids and eating foods with high amount of fiber, such as fruits, vegetables, and grains.  Do not take Tylenol if you are taking prescription pain medications.  Follow up Your surgeon may want to see you in the office following your access surgery. If so, this will be arranged at the time of your surgery.  Please call us immediately for any of the following conditions:  Increased pain, redness, drainage (pus) from your incision site Fever of 101 degrees or higher Severe or worsening pain at your incision site Hand pain or numbness.  Reduce your risk of vascular disease:  Stop smoking. If you would like help, call [facility] at 1-800-QUIT-NOW 250-096-1375) or [place] at [place] your cholesterol Maintain a desired weight Control your diabetes Keep your blood pressure down  Dialysis  It will take several weeks to several months for your new dialysis access to be ready for use. Your surgeon will determine when it is okay to use it. Your nephrologist will continue to direct your dialysis. You can continue to use your Permcath until your new access is ready for use.   01/15/2018 Oddis Westling [id number] 19-Nov-1970  Surgeon(s): Early, Arvilla Meres, MD  Procedure(s): REPAIR FALSE ANEURYSM LEFT BRACHIAL ARTERY WITH SAPHENOUS VEIN  LEFT ARTERIOVENOUS GRAFT VEIN HARVEST LEFT SAPHENOUS VEIN   If you have any questions, please call the office at [phone].

## 2018-01-15 NOTE — Progress Notes (Signed)
  Progress Note    01/15/2018 7:26 AM 1 Day Post-Op  Subjective:  Complains that his hand is swollen; denies pain in his hand.  Says he didn't let them draw labs this morning.  Afebrile HR 50's-60's NSR/SB 272'Z-366'Y systolic 40% [id number]  Vitals:   01/15/18 0425 01/15/18 0427  BP: 135/78   Pulse:  (!) 58  Resp:    Temp:  98.8 F (37.1 C)  SpO2: 95%     Physical Exam: Cardiac:  regular Lungs:  nonlabored Incisions:  Left arm incision with scant bloody ooze; left thigh incision clean and dry Extremities:  + ulnar doppler signal left; mild hand swelling   CBC    Component Value Date/Time   WBC 6.6 12/26/2017 0008   RBC 3.59 (L) 12/26/2017 0008   HGB 9.2 (L) 01/14/2018 0924   HCT 27.0 (L) 01/14/2018 0924   PLT 121 (L) 12/26/2017 0008   MCV 89.1 12/26/2017 0008   MCH 29.8 12/26/2017 0008   MCHC 33.4 12/26/2017 0008   RDW 17.6 (H) 12/26/2017 0008   LYMPHSABS 1.2 12/26/2017 0008   MONOABS 0.4 12/26/2017 0008   EOSABS 0.3 12/26/2017 0008   BASOSABS 0.0 12/26/2017 0008    BMET    Component Value Date/Time   NA 135 01/14/2018 0924   K 5.8 (H) 01/14/2018 0924   CL 94 (L) 12/26/2017 0008   CO2 20 (L) 12/26/2017 0008   GLUCOSE 71 01/14/2018 0924   BUN 68 (H) 12/26/2017 0008   CREATININE 16.99 (H) 12/26/2017 0008   CALCIUM 7.9 (L) 12/26/2017 0008   GFRNONAA 3 (L) 12/26/2017 0008   GFRAA 3 (L) 12/26/2017 0008    INR    Component Value Date/Time   INR 1.22 11/27/2016 1631     Intake/Output Summary (Last 24 hours) at 01/15/2018 0726 Last data filed at 01/15/2018 0300 Gross per 24 hour  Intake 1090 ml  Output 600 ml  Net 490 ml     Assessment:  47 y.o. male is s/p:  Resection of brachial false aneurysm and replacement with reversed great saphenous vein  1 Day Post-Op  Plan: -pt with +ulnar signal on the left -he has some swelling in his hand-dressing taken down and wrapped from the hand up.  Advised pt to keep arm elevated. -his normal dialysis days  are M/W/F at [facility] try to get him into dialysis today.  Spoke with Dr. Moshe Cipro and she will arrange with the center to have dialysis today as I called and got no answer with local and 800 number.  Pt states he has appt at his center at 1115 this morning.    Leontine Locket, PA-C [facility] [phone] 01/15/2018 7:26 AM

## 2018-01-15 NOTE — Progress Notes (Signed)
Patient has outpatient HD arranged for 11:15 am today. istat K was 5.8 yesterday pre surgery and given KCl 20 last evening.  He has refused all labs this and will be dialyzed at his outpatient center later this after discharge.   MBergman, PA-C

## 2018-01-15 NOTE — Progress Notes (Signed)
Patient came up to unit from PACU. Admissions criteria have been reviewed. Patient is ready for discharge.

## 2018-01-15 NOTE — Discharge Summary (Signed)
Discharge Summary    Marvin Roberson September 10, 1971 47 y.o. male  [id number]  Admission Date: 01/14/2018  Discharge Date: 01/15/18  Physician: Rosetta Posner, MD  Admission Diagnosis: ESRD (end stage renal disease) ([place]) [N18.6]   HPI:   This is a 47 y.o. male who has a very complex past surgical history.  He had undergone prior left axillary pseudoaneurysm with left axillary to brachial bypass at [facility].  He had presented with an expanding false aneurysm at the antecubital level and underwent extensive surgery with Dr. Bridgett Larsson on December 11, 2016.  He had resection of his left brachial artery false aneurysm and had an interposition with a 6 mm Artegraft.  Also had left subclavian retrograde angioplasty he presents now with pain and increasingly enlarging false aneurysm.   Hospital Course:  The patient was admitted to the hospital and taken to the operating room on 01/14/2018 and underwent: Resection of brachial false aneurysm and replacement with reversed great saphenous vein    The pt tolerated the procedure well and was transported to the PACU in good condition.   By POD 1, he had +doppler signal left ulnar signal.  His radial artery was occluded pre-operatively.  He did have some mild swelling in his hand.  His wrap was removed and wrapped from his hand up.  He is instructed to keep arm elevated on pillows to help with the swelling.   He did miss dialysis yesterday and is scheduled to go to his center today at 67 for dialysis.  The remainder of the hospital course consisted of increasing mobilization and increasing intake of solids without difficulty.  CBC    Component Value Date/Time   WBC 6.6 12/26/2017 0008   RBC 3.59 (L) 12/26/2017 0008   HGB 9.2 (L) 01/14/2018 0924   HCT 27.0 (L) 01/14/2018 0924   PLT 121 (L) 12/26/2017 0008   MCV 89.1 12/26/2017 0008   MCH 29.8 12/26/2017 0008   MCHC 33.4 12/26/2017 0008   RDW 17.6 (H) 12/26/2017 0008   LYMPHSABS 1.2 12/26/2017  0008   MONOABS 0.4 12/26/2017 0008   EOSABS 0.3 12/26/2017 0008   BASOSABS 0.0 12/26/2017 0008    BMET    Component Value Date/Time   NA 135 01/14/2018 0924   K 5.8 (H) 01/14/2018 0924   CL 94 (L) 12/26/2017 0008   CO2 20 (L) 12/26/2017 0008   GLUCOSE 71 01/14/2018 0924   BUN 68 (H) 12/26/2017 0008   CREATININE 16.99 (H) 12/26/2017 0008   CALCIUM 7.9 (L) 12/26/2017 0008   GFRNONAA 3 (L) 12/26/2017 0008   GFRAA 3 (L) 12/26/2017 0008        Discharge Diagnosis:  ESRD (end stage renal disease) ([place]) [N18.6]  Secondary Diagnosis: Patient Active Problem List   Diagnosis Date Noted  . ESRD (end stage renal disease) ([place]) 01/14/2018  . Hyperkalemia, diminished renal excretion 10/20/2017  . Post-operative complication 18/84/1660  . 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[place])   . ESRD on hemodialysis ([place])   . 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 Adrenal insufficiency ([place])   . Hypertension 11/27/2016  . History of CVA (cerebrovascular accident) 11/27/2016  . Monoclonal gammopathy of unknown significance (MGUS) 11/27/2016   Past Medical History:  Diagnosis Date  . Adrenal insufficiency ([place])   . Dyspnea    "when I have too much fluid"  . ESRD on dialysis [facility]) since 1990s   "M,W,F; [address]" (11/27/2016)  . GERD (gastroesophageal reflux disease)   . High cholesterol   .  History of blood transfusion   . Hypertension   . Kidney failure   . Renal insufficiency   . Stroke ([place]) 2016   decreased vision in his left eye/notes 11/27/2016     Allergies as of 01/15/2018      Reactions   Doxycycline Shortness Of Breath   Codeine Nausea And Vomiting   Gabapentin Other (See Comments)   Sleepiness, altered mental state   Lisinopril Other (See Comments), Swelling   angioedema   Povidone-iodine Itching   Betadine [povidone Iodine] Rash      Medication List    TAKE these medications   aspirin EC 81 MG tablet Take 81 mg by mouth daily.   atorvastatin 40 MG  tablet Commonly known as:  LIPITOR Take 40 mg by mouth daily.   carvedilol 25 MG tablet Commonly known as:  COREG Take 25 mg by mouth 2 (two) times daily with a meal.   hydrALAZINE 100 MG tablet Commonly known as:  APRESOLINE Take 100 mg by mouth 3 (three) times daily.   hydrocortisone 5 MG tablet Commonly known as:  CORTEF TAKE 1 TABLET BY MOUTH TWICE A DAY What changed:    how much to take  how to take this  when to take this   lactulose 20 g packet Commonly known as:  CEPHULAC Take 1 packet (20 g total) by mouth daily as needed (constipation).   losartan 100 MG tablet Commonly known as:  COZAAR Take 100 mg by mouth daily.   omeprazole 40 MG capsule Commonly known as:  PRILOSEC Take 40 mg by mouth daily.   oseltamivir 30 MG capsule Commonly known as:  TAMIFLU 30mg  after every hemodialysis treatment for 5 days   oxyCODONE-acetaminophen 5-325 MG tablet Commonly known as:  PERCOCET Take 1-2 tablets by mouth every 6 (six) hours as needed for severe pain.       Prescriptions given: Roxicet #15 No Refill  Instructions: [facility]  Discharge Instructions  AV Fistula or Graft Surgery for Dialysis Access  Please refer to the following instructions for your post-procedure care. Your surgeon or physician assistant will discuss any changes with you.  Activity  You may drive the day following your surgery, if you are comfortable and no longer taking prescription pain medication. Resume full activity as the soreness in your incision resolves.  Bathing/Showering  You may shower after you go home. Keep your incision dry for 48 hours. Do not soak in a bathtub, hot tub, or swim until the incision heals completely. You may not shower if you have a hemodialysis catheter.  Incision Care  Clean your incision with mild soap and water after 48 hours. Pat the area dry with a clean towel. You do not need a bandage unless otherwise  instructed. Do not apply any ointments or creams to your incision. You may have skin glue on your incision. Do not peel it off. It will come off on its own in about one week. Your arm may swell a bit after surgery. To reduce swelling use pillows to elevate your arm so it is above your heart. Your doctor will tell you if you need to lightly wrap your arm with an ACE bandage.  Remove bandage in 24-48 hours and then wash wound with soap and water ONLY.   DO NOT USE ANYTHING ELSE ON YOUR INCISION EXCEPT SOAP AND WATER.  DO NOT USE ANY TYPE OF OINTMENTS.  Diet  Resume your normal diet. There are not special  food restrictions following this procedure. In order to heal from your surgery, it is CRITICAL to get adequate nutrition. Your body requires vitamins, minerals, and protein. Vegetables are the best source of vitamins and minerals. Vegetables also provide the perfect balance of protein. Processed food has little nutritional value, so try to avoid this.  Medications  Resume taking all of your medications. If your incision is causing pain, you may take over-the counter pain relievers such as acetaminophen (Tylenol). If you were prescribed a stronger pain medication, please be aware these medications can cause nausea and constipation. Prevent nausea by taking the medication with a snack or meal. Avoid constipation by drinking plenty of fluids and eating foods with high amount of fiber, such as fruits, vegetables, and grains.  Do not take Tylenol if you are taking prescription pain medications.  Follow up Your surgeon may want to see you in the office following your access surgery. If so, this will be arranged at the time of your surgery.  Please call us immediately for any of the following conditions:   Increased pain, redness, drainage (pus) from your incision site  Fever of 101 degrees or higher  Severe or worsening pain at your incision site  Hand pain or numbness.   Reduce your risk  of vascular disease:   Stop smoking. If you would like help, call [facility] at 1-800-QUIT-NOW 870-629-0103) or [place] at [place] your cholesterol  Maintain a desired weight  Control your diabetes  Keep your blood pressure down  Dialysis  It will take several weeks to several months for your new dialysis access to be ready for use. Your surgeon will determine when it is okay to use it. Your nephrologist will continue to direct your dialysis. You can continue to use your Permcath until your new access is ready for use.  Disposition: home and will go to HD treatment today at 1115 (01/15/18)  Patient's condition: is Good  Follow up: 1. Dr. Donnetta Hutching in 2 weeks   Leontine Locket, PA-C [facility] [phone] 01/15/2018  8:02 AM

## 2018-01-15 NOTE — Progress Notes (Signed)
Patient is noncompliant with medications tonight. RN is trying to speak with patient about care and pain medication but he continues to argue with nurse- stating that he is leaving in the morning. Will give more pain medication and will monitor patient.

## 2018-01-15 NOTE — Anesthesia Postprocedure Evaluation (Signed)
Anesthesia Post Note  Patient: Marvin Roberson  Procedure(s) Performed: REPAIR FALSE ANEURYSM LEFT BRACHIAL ARTERY WITH SAPHENOUS VEIN  LEFT ARTERIOVENOUS GRAFT (Left ) VEIN HARVEST LEFT SAPHENOUS VEIN (Left )     Patient location during evaluation: PACU Anesthesia Type: General Level of consciousness: awake and alert Pain management: pain level controlled Vital Signs Assessment: post-procedure vital signs reviewed and stable Respiratory status: spontaneous breathing, nonlabored ventilation, respiratory function stable and patient connected to nasal cannula oxygen Cardiovascular status: blood pressure returned to baseline and stable Postop Assessment: no apparent nausea or vomiting Anesthetic complications: no    Last Vitals:  Vitals:   01/15/18 0427 01/15/18 0751  BP:  (!) 147/83  Pulse: (!) 58 (!) 56  Resp:    Temp: 37.1 C 36.4 C  SpO2:      Last Pain:  Vitals:   01/15/18 0751  TempSrc: Oral  PainSc:                  Rangel Echeverri

## 2018-01-15 NOTE — Progress Notes (Signed)
Patient refuses all labs this morning.

## 2018-01-16 ENCOUNTER — Telehealth: Payer: Self-pay | Admitting: Vascular Surgery

## 2018-01-16 DIAGNOSIS — N2581 Secondary hyperparathyroidism of renal origin: Secondary | ICD-10-CM | POA: Diagnosis not present

## 2018-01-16 DIAGNOSIS — N186 End stage renal disease: Secondary | ICD-10-CM | POA: Diagnosis not present

## 2018-01-16 DIAGNOSIS — D631 Anemia in chronic kidney disease: Secondary | ICD-10-CM | POA: Diagnosis not present

## 2018-01-16 DIAGNOSIS — Z992 Dependence on renal dialysis: Secondary | ICD-10-CM | POA: Diagnosis not present

## 2018-01-16 NOTE — Telephone Encounter (Signed)
spoke to pt and confirmed 02/03/18 appt . Mailed letter.

## 2018-01-16 NOTE — Telephone Encounter (Signed)
-----   Message from Mena Goes, RN sent at 01/15/2018 10:10 AM EDT ----- Regarding: 2-3 weeks    ----- Message ----- From: Natividad Brood Sent: 01/15/2018   7:37 AM To: V[facility] Charge Pool  S/p Resection of brachial false aneurysm and replacement with reversed great saphenous vein by Dr. Donnetta Hutching.  F/u with him in a couple of weeks.  Okay to put on his schedule as PA to see.   Thanks

## 2018-01-19 DIAGNOSIS — D631 Anemia in chronic kidney disease: Secondary | ICD-10-CM | POA: Diagnosis not present

## 2018-01-19 DIAGNOSIS — N2581 Secondary hyperparathyroidism of renal origin: Secondary | ICD-10-CM | POA: Diagnosis not present

## 2018-01-19 DIAGNOSIS — Z992 Dependence on renal dialysis: Secondary | ICD-10-CM | POA: Diagnosis not present

## 2018-01-19 DIAGNOSIS — N186 End stage renal disease: Secondary | ICD-10-CM | POA: Diagnosis not present

## 2018-01-21 DIAGNOSIS — D631 Anemia in chronic kidney disease: Secondary | ICD-10-CM | POA: Diagnosis not present

## 2018-01-21 DIAGNOSIS — Z992 Dependence on renal dialysis: Secondary | ICD-10-CM | POA: Diagnosis not present

## 2018-01-21 DIAGNOSIS — N186 End stage renal disease: Secondary | ICD-10-CM | POA: Diagnosis not present

## 2018-01-21 DIAGNOSIS — N2581 Secondary hyperparathyroidism of renal origin: Secondary | ICD-10-CM | POA: Diagnosis not present

## 2018-01-22 ENCOUNTER — Ambulatory Visit (HOSPITAL_COMMUNITY)
Admission: RE | Admit: 2018-01-22 | Discharge: 2018-01-22 | Disposition: A | Discharge status: Home / Self Care | Payer: Medicare Other | Source: Ambulatory Visit | Attending: Nephrology | Admitting: Nephrology

## 2018-01-22 ENCOUNTER — Encounter (HOSPITAL_COMMUNITY): Payer: Self-pay

## 2018-01-22 DIAGNOSIS — Z992 Dependence on renal dialysis: Secondary | ICD-10-CM | POA: Diagnosis not present

## 2018-01-22 LAB — ABO/RH: ABO/RH(D): A POS

## 2018-01-22 LAB — PREPARE RBC (CROSSMATCH)

## 2018-01-22 MED ORDER — SODIUM CHLORIDE 0.9 % IV SOLN: Freq: Once | INTRAVENOUS | Status: DC

## 2018-01-22 MED ORDER — DIPHENHYDRAMINE HCL 25 MG PO CAPS
25.0000 mg | ORAL_CAPSULE | Freq: Once | ORAL | Status: AC
  Administered 2018-01-22: 25 mg via ORAL
  Filled 2018-01-22: qty 1

## 2018-01-22 MED ORDER — ACETAMINOPHEN 325 MG PO TABS
325.0000 mg | ORAL_TABLET | Freq: Once | ORAL | Status: AC
  Administered 2018-01-22: 325 mg via ORAL
  Filled 2018-01-22: qty 1

## 2018-01-22 NOTE — Progress Notes (Signed)
Patient presented to the [facility] for transfusion of 1 unit of PRBCs. Orders obtained from [place] PA from [facility].  1 U PRBC transfused per protocol.  Patient tolerated procedure well.  Ambulatory at time of discharge.

## 2018-01-23 DIAGNOSIS — N186 End stage renal disease: Secondary | ICD-10-CM | POA: Diagnosis not present

## 2018-01-23 DIAGNOSIS — D631 Anemia in chronic kidney disease: Secondary | ICD-10-CM | POA: Diagnosis not present

## 2018-01-23 DIAGNOSIS — N2581 Secondary hyperparathyroidism of renal origin: Secondary | ICD-10-CM | POA: Diagnosis not present

## 2018-01-23 DIAGNOSIS — Z992 Dependence on renal dialysis: Secondary | ICD-10-CM | POA: Diagnosis not present

## 2018-01-23 LAB — TYPE AND SCREEN
ABO/RH(D): A POS
Antibody Screen: NEGATIVE
Unit division: 0

## 2018-01-23 LAB — BPAM RBC
Blood Product Expiration Date: 201904032359
ISSUE DATE / TIME: 201903211122
Unit Type and Rh: 6200

## 2018-01-26 DIAGNOSIS — N2581 Secondary hyperparathyroidism of renal origin: Secondary | ICD-10-CM | POA: Diagnosis not present

## 2018-01-26 DIAGNOSIS — D631 Anemia in chronic kidney disease: Secondary | ICD-10-CM | POA: Diagnosis not present

## 2018-01-26 DIAGNOSIS — Z992 Dependence on renal dialysis: Secondary | ICD-10-CM | POA: Diagnosis not present

## 2018-01-26 DIAGNOSIS — N186 End stage renal disease: Secondary | ICD-10-CM | POA: Diagnosis not present

## 2018-01-28 DIAGNOSIS — Z992 Dependence on renal dialysis: Secondary | ICD-10-CM | POA: Diagnosis not present

## 2018-01-28 DIAGNOSIS — N2581 Secondary hyperparathyroidism of renal origin: Secondary | ICD-10-CM | POA: Diagnosis not present

## 2018-01-28 DIAGNOSIS — N186 End stage renal disease: Secondary | ICD-10-CM | POA: Diagnosis not present

## 2018-01-28 DIAGNOSIS — D631 Anemia in chronic kidney disease: Secondary | ICD-10-CM | POA: Diagnosis not present

## 2018-01-30 DIAGNOSIS — N186 End stage renal disease: Secondary | ICD-10-CM | POA: Diagnosis not present

## 2018-01-30 DIAGNOSIS — D631 Anemia in chronic kidney disease: Secondary | ICD-10-CM | POA: Diagnosis not present

## 2018-01-30 DIAGNOSIS — Z992 Dependence on renal dialysis: Secondary | ICD-10-CM | POA: Diagnosis not present

## 2018-01-30 DIAGNOSIS — N2581 Secondary hyperparathyroidism of renal origin: Secondary | ICD-10-CM | POA: Diagnosis not present

## 2018-02-02 DIAGNOSIS — Z992 Dependence on renal dialysis: Secondary | ICD-10-CM | POA: Diagnosis not present

## 2018-02-02 DIAGNOSIS — I129 Hypertensive chronic kidney disease with stage 1 through stage 4 chronic kidney disease, or unspecified chronic kidney disease: Secondary | ICD-10-CM | POA: Diagnosis not present

## 2018-02-02 DIAGNOSIS — D631 Anemia in chronic kidney disease: Secondary | ICD-10-CM | POA: Diagnosis not present

## 2018-02-02 DIAGNOSIS — N2581 Secondary hyperparathyroidism of renal origin: Secondary | ICD-10-CM | POA: Diagnosis not present

## 2018-02-02 DIAGNOSIS — N186 End stage renal disease: Secondary | ICD-10-CM | POA: Diagnosis not present

## 2018-02-03 ENCOUNTER — Encounter: Payer: Medicare Other | Admitting: Vascular Surgery

## 2018-02-04 DIAGNOSIS — N2581 Secondary hyperparathyroidism of renal origin: Secondary | ICD-10-CM | POA: Diagnosis not present

## 2018-02-04 DIAGNOSIS — Z992 Dependence on renal dialysis: Secondary | ICD-10-CM | POA: Diagnosis not present

## 2018-02-04 DIAGNOSIS — D631 Anemia in chronic kidney disease: Secondary | ICD-10-CM | POA: Diagnosis not present

## 2018-02-04 DIAGNOSIS — N186 End stage renal disease: Secondary | ICD-10-CM | POA: Diagnosis not present

## 2018-02-06 DIAGNOSIS — N2581 Secondary hyperparathyroidism of renal origin: Secondary | ICD-10-CM | POA: Diagnosis not present

## 2018-02-06 DIAGNOSIS — Z992 Dependence on renal dialysis: Secondary | ICD-10-CM | POA: Diagnosis not present

## 2018-02-06 DIAGNOSIS — N186 End stage renal disease: Secondary | ICD-10-CM | POA: Diagnosis not present

## 2018-02-06 DIAGNOSIS — D631 Anemia in chronic kidney disease: Secondary | ICD-10-CM | POA: Diagnosis not present

## 2018-02-09 DIAGNOSIS — D631 Anemia in chronic kidney disease: Secondary | ICD-10-CM | POA: Diagnosis not present

## 2018-02-09 DIAGNOSIS — N186 End stage renal disease: Secondary | ICD-10-CM | POA: Diagnosis not present

## 2018-02-09 DIAGNOSIS — Z992 Dependence on renal dialysis: Secondary | ICD-10-CM | POA: Diagnosis not present

## 2018-02-09 DIAGNOSIS — N2581 Secondary hyperparathyroidism of renal origin: Secondary | ICD-10-CM | POA: Diagnosis not present

## 2018-02-11 DIAGNOSIS — N2581 Secondary hyperparathyroidism of renal origin: Secondary | ICD-10-CM | POA: Diagnosis not present

## 2018-02-11 DIAGNOSIS — D631 Anemia in chronic kidney disease: Secondary | ICD-10-CM | POA: Diagnosis not present

## 2018-02-11 DIAGNOSIS — N186 End stage renal disease: Secondary | ICD-10-CM | POA: Diagnosis not present

## 2018-02-11 DIAGNOSIS — Z992 Dependence on renal dialysis: Secondary | ICD-10-CM | POA: Diagnosis not present

## 2018-02-13 DIAGNOSIS — N2581 Secondary hyperparathyroidism of renal origin: Secondary | ICD-10-CM | POA: Diagnosis not present

## 2018-02-13 DIAGNOSIS — N186 End stage renal disease: Secondary | ICD-10-CM | POA: Diagnosis not present

## 2018-02-13 DIAGNOSIS — D631 Anemia in chronic kidney disease: Secondary | ICD-10-CM | POA: Diagnosis not present

## 2018-02-13 DIAGNOSIS — Z992 Dependence on renal dialysis: Secondary | ICD-10-CM | POA: Diagnosis not present

## 2018-02-16 DIAGNOSIS — N186 End stage renal disease: Secondary | ICD-10-CM | POA: Diagnosis not present

## 2018-02-16 DIAGNOSIS — Z992 Dependence on renal dialysis: Secondary | ICD-10-CM | POA: Diagnosis not present

## 2018-02-16 DIAGNOSIS — D631 Anemia in chronic kidney disease: Secondary | ICD-10-CM | POA: Diagnosis not present

## 2018-02-16 DIAGNOSIS — N2581 Secondary hyperparathyroidism of renal origin: Secondary | ICD-10-CM | POA: Diagnosis not present

## 2018-02-18 DIAGNOSIS — D631 Anemia in chronic kidney disease: Secondary | ICD-10-CM | POA: Diagnosis not present

## 2018-02-18 DIAGNOSIS — N2581 Secondary hyperparathyroidism of renal origin: Secondary | ICD-10-CM | POA: Diagnosis not present

## 2018-02-18 DIAGNOSIS — Z992 Dependence on renal dialysis: Secondary | ICD-10-CM | POA: Diagnosis not present

## 2018-02-18 DIAGNOSIS — N186 End stage renal disease: Secondary | ICD-10-CM | POA: Diagnosis not present

## 2018-02-20 DIAGNOSIS — Z992 Dependence on renal dialysis: Secondary | ICD-10-CM | POA: Diagnosis not present

## 2018-02-20 DIAGNOSIS — N2581 Secondary hyperparathyroidism of renal origin: Secondary | ICD-10-CM | POA: Diagnosis not present

## 2018-02-20 DIAGNOSIS — N186 End stage renal disease: Secondary | ICD-10-CM | POA: Diagnosis not present

## 2018-02-20 DIAGNOSIS — D631 Anemia in chronic kidney disease: Secondary | ICD-10-CM | POA: Diagnosis not present

## 2018-02-23 DIAGNOSIS — N2581 Secondary hyperparathyroidism of renal origin: Secondary | ICD-10-CM | POA: Diagnosis not present

## 2018-02-23 DIAGNOSIS — Z992 Dependence on renal dialysis: Secondary | ICD-10-CM | POA: Diagnosis not present

## 2018-02-23 DIAGNOSIS — D631 Anemia in chronic kidney disease: Secondary | ICD-10-CM | POA: Diagnosis not present

## 2018-02-23 DIAGNOSIS — N186 End stage renal disease: Secondary | ICD-10-CM | POA: Diagnosis not present

## 2018-02-24 ENCOUNTER — Other Ambulatory Visit: Payer: Self-pay

## 2018-02-24 ENCOUNTER — Ambulatory Visit (INDEPENDENT_AMBULATORY_CARE_PROVIDER_SITE_OTHER): Payer: Medicare Other | Admitting: Vascular Surgery

## 2018-02-24 ENCOUNTER — Encounter: Payer: Self-pay | Admitting: Vascular Surgery

## 2018-02-24 VITALS — BP 194/123 | HR 70 | Temp 97.0°F | Resp 16 | Ht 68.0 in | Wt 139.6 lb

## 2018-02-24 DIAGNOSIS — I721 Aneurysm of artery of upper extremity: Secondary | ICD-10-CM

## 2018-02-24 NOTE — Progress Notes (Signed)
   Patient name: Marvin Roberson MRN: [id number] DOB: 1971-05-10 Sex: male  REASON FOR VISIT: Follow-up resection of left brachial painful pseudoaneurysm in the antecubital space  HPI: Marvin Roberson is a 47 y.o. male here today for follow-up.  And undergone resection of this aneurysm which resulted from multiple prior left arm renal access procedures and attempted repair at his false aneurysm.  He had resection of this area and replacement with reverse great saphenous vein from his left thigh.  He looks quite good today.  Reports marked improvement in the discomfort associated with the prior aneurysm  Current Outpatient Medications  Medication Sig Dispense Refill  . aspirin EC 81 MG tablet Take 81 mg by mouth daily.    Marland Kitchen atorvastatin (LIPITOR) 40 MG tablet Take 40 mg by mouth daily.    . carvedilol (COREG) 25 MG tablet Take 25 mg by mouth 2 (two) times daily with a meal.    . hydrALAZINE (APRESOLINE) 100 MG tablet Take 100 mg by mouth 3 (three) times daily.    . hydrocortisone (CORTEF) 5 MG tablet TAKE 1 TABLET BY MOUTH TWICE A DAY (Patient taking differently: TAKE 1 TABLET (5 MG) BY MOUTH TWICE A DAY) 30 tablet 0  . losartan (COZAAR) 100 MG tablet Take 100 mg by mouth daily.    Marland Kitchen omeprazole (PRILOSEC) 40 MG capsule Take 40 mg by mouth daily.     No current facility-administered medications for this visit.      PHYSICAL EXAM: Vitals:   02/24/18 1130  BP: (!) 194/123  Pulse: 70  Resp: 16  Temp: (!) 97 F (36.1 C)  TempSrc: Oral  SpO2: 99%  Weight: 139 lb 9.6 oz (63.3 kg)  Height: 5\' 8"  (1.727 m)    GENERAL: The patient is a well-nourished male, in no acute distress. The vital signs are documented above. Vein harvest and antecubital incision both healed quite nicely.  No evidence of aneurysm.  Easily palpable left ulnar pulse  MEDICAL ISSUES: Stable overall.  We will continue his full activities without limitation.  Reports that he is moving back to [place] within the next 1 to 2 months.  Will see [place] again on an as-needed basis   Rosetta Posner, MD [facility] Tel [phone] Pager [phone]

## 2018-02-27 DIAGNOSIS — N186 End stage renal disease: Secondary | ICD-10-CM | POA: Diagnosis not present

## 2018-02-27 DIAGNOSIS — Z992 Dependence on renal dialysis: Secondary | ICD-10-CM | POA: Diagnosis not present

## 2018-02-27 DIAGNOSIS — D631 Anemia in chronic kidney disease: Secondary | ICD-10-CM | POA: Diagnosis not present

## 2018-02-27 DIAGNOSIS — N2581 Secondary hyperparathyroidism of renal origin: Secondary | ICD-10-CM | POA: Diagnosis not present

## 2018-03-02 DIAGNOSIS — N186 End stage renal disease: Secondary | ICD-10-CM | POA: Diagnosis not present

## 2018-03-02 DIAGNOSIS — Z992 Dependence on renal dialysis: Secondary | ICD-10-CM | POA: Diagnosis not present

## 2018-03-02 DIAGNOSIS — N2581 Secondary hyperparathyroidism of renal origin: Secondary | ICD-10-CM | POA: Diagnosis not present

## 2018-03-02 DIAGNOSIS — D631 Anemia in chronic kidney disease: Secondary | ICD-10-CM | POA: Diagnosis not present

## 2018-03-04 DIAGNOSIS — D509 Iron deficiency anemia, unspecified: Secondary | ICD-10-CM | POA: Diagnosis not present

## 2018-03-04 DIAGNOSIS — D631 Anemia in chronic kidney disease: Secondary | ICD-10-CM | POA: Diagnosis not present

## 2018-03-04 DIAGNOSIS — Z992 Dependence on renal dialysis: Secondary | ICD-10-CM | POA: Diagnosis not present

## 2018-03-04 DIAGNOSIS — I129 Hypertensive chronic kidney disease with stage 1 through stage 4 chronic kidney disease, or unspecified chronic kidney disease: Secondary | ICD-10-CM | POA: Diagnosis not present

## 2018-03-04 DIAGNOSIS — N186 End stage renal disease: Secondary | ICD-10-CM | POA: Diagnosis not present

## 2018-03-04 DIAGNOSIS — N2581 Secondary hyperparathyroidism of renal origin: Secondary | ICD-10-CM | POA: Diagnosis not present

## 2018-03-06 DIAGNOSIS — D631 Anemia in chronic kidney disease: Secondary | ICD-10-CM | POA: Diagnosis not present

## 2018-03-06 DIAGNOSIS — D509 Iron deficiency anemia, unspecified: Secondary | ICD-10-CM | POA: Diagnosis not present

## 2018-03-06 DIAGNOSIS — Z992 Dependence on renal dialysis: Secondary | ICD-10-CM | POA: Diagnosis not present

## 2018-03-06 DIAGNOSIS — N186 End stage renal disease: Secondary | ICD-10-CM | POA: Diagnosis not present

## 2018-03-06 DIAGNOSIS — N2581 Secondary hyperparathyroidism of renal origin: Secondary | ICD-10-CM | POA: Diagnosis not present

## 2018-03-09 DIAGNOSIS — D509 Iron deficiency anemia, unspecified: Secondary | ICD-10-CM | POA: Diagnosis not present

## 2018-03-09 DIAGNOSIS — D631 Anemia in chronic kidney disease: Secondary | ICD-10-CM | POA: Diagnosis not present

## 2018-03-09 DIAGNOSIS — N186 End stage renal disease: Secondary | ICD-10-CM | POA: Diagnosis not present

## 2018-03-09 DIAGNOSIS — Z992 Dependence on renal dialysis: Secondary | ICD-10-CM | POA: Diagnosis not present

## 2018-03-09 DIAGNOSIS — N2581 Secondary hyperparathyroidism of renal origin: Secondary | ICD-10-CM | POA: Diagnosis not present

## 2018-03-11 DIAGNOSIS — N2581 Secondary hyperparathyroidism of renal origin: Secondary | ICD-10-CM | POA: Diagnosis not present

## 2018-03-11 DIAGNOSIS — Z992 Dependence on renal dialysis: Secondary | ICD-10-CM | POA: Diagnosis not present

## 2018-03-11 DIAGNOSIS — N186 End stage renal disease: Secondary | ICD-10-CM | POA: Diagnosis not present

## 2018-03-11 DIAGNOSIS — D631 Anemia in chronic kidney disease: Secondary | ICD-10-CM | POA: Diagnosis not present

## 2018-03-11 DIAGNOSIS — D509 Iron deficiency anemia, unspecified: Secondary | ICD-10-CM | POA: Diagnosis not present

## 2018-03-13 DIAGNOSIS — D631 Anemia in chronic kidney disease: Secondary | ICD-10-CM | POA: Diagnosis not present

## 2018-03-13 DIAGNOSIS — D509 Iron deficiency anemia, unspecified: Secondary | ICD-10-CM | POA: Diagnosis not present

## 2018-03-13 DIAGNOSIS — N2581 Secondary hyperparathyroidism of renal origin: Secondary | ICD-10-CM | POA: Diagnosis not present

## 2018-03-13 DIAGNOSIS — Z992 Dependence on renal dialysis: Secondary | ICD-10-CM | POA: Diagnosis not present

## 2018-03-13 DIAGNOSIS — N186 End stage renal disease: Secondary | ICD-10-CM | POA: Diagnosis not present

## 2018-03-16 DIAGNOSIS — Z992 Dependence on renal dialysis: Secondary | ICD-10-CM | POA: Diagnosis not present

## 2018-03-16 DIAGNOSIS — N186 End stage renal disease: Secondary | ICD-10-CM | POA: Diagnosis not present

## 2018-03-16 DIAGNOSIS — D509 Iron deficiency anemia, unspecified: Secondary | ICD-10-CM | POA: Diagnosis not present

## 2018-03-16 DIAGNOSIS — N2581 Secondary hyperparathyroidism of renal origin: Secondary | ICD-10-CM | POA: Diagnosis not present

## 2018-03-16 DIAGNOSIS — D631 Anemia in chronic kidney disease: Secondary | ICD-10-CM | POA: Diagnosis not present

## 2018-03-18 DIAGNOSIS — Z992 Dependence on renal dialysis: Secondary | ICD-10-CM | POA: Diagnosis not present

## 2018-03-18 DIAGNOSIS — N2581 Secondary hyperparathyroidism of renal origin: Secondary | ICD-10-CM | POA: Diagnosis not present

## 2018-03-18 DIAGNOSIS — D509 Iron deficiency anemia, unspecified: Secondary | ICD-10-CM | POA: Diagnosis not present

## 2018-03-18 DIAGNOSIS — N186 End stage renal disease: Secondary | ICD-10-CM | POA: Diagnosis not present

## 2018-03-18 DIAGNOSIS — D631 Anemia in chronic kidney disease: Secondary | ICD-10-CM | POA: Diagnosis not present

## 2018-03-20 DIAGNOSIS — D631 Anemia in chronic kidney disease: Secondary | ICD-10-CM | POA: Diagnosis not present

## 2018-03-20 DIAGNOSIS — N186 End stage renal disease: Secondary | ICD-10-CM | POA: Diagnosis not present

## 2018-03-20 DIAGNOSIS — N2581 Secondary hyperparathyroidism of renal origin: Secondary | ICD-10-CM | POA: Diagnosis not present

## 2018-03-20 DIAGNOSIS — Z992 Dependence on renal dialysis: Secondary | ICD-10-CM | POA: Diagnosis not present

## 2018-03-20 DIAGNOSIS — D509 Iron deficiency anemia, unspecified: Secondary | ICD-10-CM | POA: Diagnosis not present

## 2018-03-23 DIAGNOSIS — N2581 Secondary hyperparathyroidism of renal origin: Secondary | ICD-10-CM | POA: Diagnosis not present

## 2018-03-23 DIAGNOSIS — D631 Anemia in chronic kidney disease: Secondary | ICD-10-CM | POA: Diagnosis not present

## 2018-03-23 DIAGNOSIS — N186 End stage renal disease: Secondary | ICD-10-CM | POA: Diagnosis not present

## 2018-03-23 DIAGNOSIS — Z992 Dependence on renal dialysis: Secondary | ICD-10-CM | POA: Diagnosis not present

## 2018-03-23 DIAGNOSIS — D509 Iron deficiency anemia, unspecified: Secondary | ICD-10-CM | POA: Diagnosis not present

## 2018-03-25 DIAGNOSIS — N186 End stage renal disease: Secondary | ICD-10-CM | POA: Diagnosis not present

## 2018-03-25 DIAGNOSIS — D509 Iron deficiency anemia, unspecified: Secondary | ICD-10-CM | POA: Diagnosis not present

## 2018-03-25 DIAGNOSIS — N2581 Secondary hyperparathyroidism of renal origin: Secondary | ICD-10-CM | POA: Diagnosis not present

## 2018-03-25 DIAGNOSIS — D631 Anemia in chronic kidney disease: Secondary | ICD-10-CM | POA: Diagnosis not present

## 2018-03-25 DIAGNOSIS — Z992 Dependence on renal dialysis: Secondary | ICD-10-CM | POA: Diagnosis not present

## 2018-03-27 DIAGNOSIS — Z992 Dependence on renal dialysis: Secondary | ICD-10-CM | POA: Diagnosis not present

## 2018-03-27 DIAGNOSIS — N186 End stage renal disease: Secondary | ICD-10-CM | POA: Diagnosis not present

## 2018-03-27 DIAGNOSIS — D631 Anemia in chronic kidney disease: Secondary | ICD-10-CM | POA: Diagnosis not present

## 2018-03-27 DIAGNOSIS — D509 Iron deficiency anemia, unspecified: Secondary | ICD-10-CM | POA: Diagnosis not present

## 2018-03-27 DIAGNOSIS — N2581 Secondary hyperparathyroidism of renal origin: Secondary | ICD-10-CM | POA: Diagnosis not present

## 2018-03-30 DIAGNOSIS — N186 End stage renal disease: Secondary | ICD-10-CM | POA: Diagnosis not present

## 2018-03-30 DIAGNOSIS — Z992 Dependence on renal dialysis: Secondary | ICD-10-CM | POA: Diagnosis not present

## 2018-03-30 DIAGNOSIS — D631 Anemia in chronic kidney disease: Secondary | ICD-10-CM | POA: Diagnosis not present

## 2018-03-30 DIAGNOSIS — D509 Iron deficiency anemia, unspecified: Secondary | ICD-10-CM | POA: Diagnosis not present

## 2018-03-30 DIAGNOSIS — N2581 Secondary hyperparathyroidism of renal origin: Secondary | ICD-10-CM | POA: Diagnosis not present

## 2018-04-01 DIAGNOSIS — Z992 Dependence on renal dialysis: Secondary | ICD-10-CM | POA: Diagnosis not present

## 2018-04-01 DIAGNOSIS — D631 Anemia in chronic kidney disease: Secondary | ICD-10-CM | POA: Diagnosis not present

## 2018-04-01 DIAGNOSIS — D509 Iron deficiency anemia, unspecified: Secondary | ICD-10-CM | POA: Diagnosis not present

## 2018-04-01 DIAGNOSIS — N186 End stage renal disease: Secondary | ICD-10-CM | POA: Diagnosis not present

## 2018-04-01 DIAGNOSIS — N2581 Secondary hyperparathyroidism of renal origin: Secondary | ICD-10-CM | POA: Diagnosis not present

## 2018-04-03 DIAGNOSIS — N186 End stage renal disease: Secondary | ICD-10-CM | POA: Diagnosis not present

## 2018-04-03 DIAGNOSIS — D631 Anemia in chronic kidney disease: Secondary | ICD-10-CM | POA: Diagnosis not present

## 2018-04-03 DIAGNOSIS — N2581 Secondary hyperparathyroidism of renal origin: Secondary | ICD-10-CM | POA: Diagnosis not present

## 2018-04-03 DIAGNOSIS — Z992 Dependence on renal dialysis: Secondary | ICD-10-CM | POA: Diagnosis not present

## 2018-04-03 DIAGNOSIS — D509 Iron deficiency anemia, unspecified: Secondary | ICD-10-CM | POA: Diagnosis not present

## 2018-04-04 DIAGNOSIS — I129 Hypertensive chronic kidney disease with stage 1 through stage 4 chronic kidney disease, or unspecified chronic kidney disease: Secondary | ICD-10-CM | POA: Diagnosis not present

## 2018-04-04 DIAGNOSIS — N186 End stage renal disease: Secondary | ICD-10-CM | POA: Diagnosis not present

## 2018-04-04 DIAGNOSIS — Z992 Dependence on renal dialysis: Secondary | ICD-10-CM | POA: Diagnosis not present

## 2018-04-06 DIAGNOSIS — Z992 Dependence on renal dialysis: Secondary | ICD-10-CM | POA: Diagnosis not present

## 2018-04-06 DIAGNOSIS — N2581 Secondary hyperparathyroidism of renal origin: Secondary | ICD-10-CM | POA: Diagnosis not present

## 2018-04-06 DIAGNOSIS — N186 End stage renal disease: Secondary | ICD-10-CM | POA: Diagnosis not present

## 2018-04-06 DIAGNOSIS — D631 Anemia in chronic kidney disease: Secondary | ICD-10-CM | POA: Diagnosis not present

## 2018-04-06 DIAGNOSIS — D509 Iron deficiency anemia, unspecified: Secondary | ICD-10-CM | POA: Diagnosis not present

## 2018-04-08 DIAGNOSIS — N2581 Secondary hyperparathyroidism of renal origin: Secondary | ICD-10-CM | POA: Diagnosis not present

## 2018-04-08 DIAGNOSIS — N186 End stage renal disease: Secondary | ICD-10-CM | POA: Diagnosis not present

## 2018-04-08 DIAGNOSIS — Z992 Dependence on renal dialysis: Secondary | ICD-10-CM | POA: Diagnosis not present

## 2018-04-08 DIAGNOSIS — D631 Anemia in chronic kidney disease: Secondary | ICD-10-CM | POA: Diagnosis not present

## 2018-04-08 DIAGNOSIS — D509 Iron deficiency anemia, unspecified: Secondary | ICD-10-CM | POA: Diagnosis not present

## 2018-04-10 DIAGNOSIS — N2581 Secondary hyperparathyroidism of renal origin: Secondary | ICD-10-CM | POA: Diagnosis not present

## 2018-04-10 DIAGNOSIS — D509 Iron deficiency anemia, unspecified: Secondary | ICD-10-CM | POA: Diagnosis not present

## 2018-04-10 DIAGNOSIS — Z992 Dependence on renal dialysis: Secondary | ICD-10-CM | POA: Diagnosis not present

## 2018-04-10 DIAGNOSIS — N186 End stage renal disease: Secondary | ICD-10-CM | POA: Diagnosis not present

## 2018-04-10 DIAGNOSIS — D631 Anemia in chronic kidney disease: Secondary | ICD-10-CM | POA: Diagnosis not present

## 2018-04-13 DIAGNOSIS — D631 Anemia in chronic kidney disease: Secondary | ICD-10-CM | POA: Diagnosis not present

## 2018-04-13 DIAGNOSIS — D509 Iron deficiency anemia, unspecified: Secondary | ICD-10-CM | POA: Diagnosis not present

## 2018-04-13 DIAGNOSIS — N186 End stage renal disease: Secondary | ICD-10-CM | POA: Diagnosis not present

## 2018-04-13 DIAGNOSIS — N2581 Secondary hyperparathyroidism of renal origin: Secondary | ICD-10-CM | POA: Diagnosis not present

## 2018-04-13 DIAGNOSIS — Z992 Dependence on renal dialysis: Secondary | ICD-10-CM | POA: Diagnosis not present

## 2018-04-14 ENCOUNTER — Ambulatory Visit
Admission: RE | Admit: 2018-04-14 | Discharge: 2018-04-14 | Disposition: A | Discharge status: Home / Self Care | Payer: Medicare Other | Source: Ambulatory Visit | Attending: Nephrology | Admitting: Nephrology

## 2018-04-14 ENCOUNTER — Other Ambulatory Visit: Payer: Self-pay | Admitting: Nephrology

## 2018-04-14 DIAGNOSIS — Z7184 Encounter for health counseling related to travel: Secondary | ICD-10-CM

## 2018-04-14 DIAGNOSIS — Z7189 Other specified counseling: Secondary | ICD-10-CM | POA: Diagnosis not present

## 2018-04-15 DIAGNOSIS — Z992 Dependence on renal dialysis: Secondary | ICD-10-CM | POA: Diagnosis not present

## 2018-04-15 DIAGNOSIS — N2581 Secondary hyperparathyroidism of renal origin: Secondary | ICD-10-CM | POA: Diagnosis not present

## 2018-04-15 DIAGNOSIS — N186 End stage renal disease: Secondary | ICD-10-CM | POA: Diagnosis not present

## 2018-04-15 DIAGNOSIS — D509 Iron deficiency anemia, unspecified: Secondary | ICD-10-CM | POA: Diagnosis not present

## 2018-04-15 DIAGNOSIS — D631 Anemia in chronic kidney disease: Secondary | ICD-10-CM | POA: Diagnosis not present

## 2018-04-17 DIAGNOSIS — D509 Iron deficiency anemia, unspecified: Secondary | ICD-10-CM | POA: Diagnosis not present

## 2018-04-17 DIAGNOSIS — N186 End stage renal disease: Secondary | ICD-10-CM | POA: Diagnosis not present

## 2018-04-17 DIAGNOSIS — D631 Anemia in chronic kidney disease: Secondary | ICD-10-CM | POA: Diagnosis not present

## 2018-04-17 DIAGNOSIS — N2581 Secondary hyperparathyroidism of renal origin: Secondary | ICD-10-CM | POA: Diagnosis not present

## 2018-04-17 DIAGNOSIS — Z992 Dependence on renal dialysis: Secondary | ICD-10-CM | POA: Diagnosis not present

## 2018-04-20 DIAGNOSIS — D509 Iron deficiency anemia, unspecified: Secondary | ICD-10-CM | POA: Diagnosis not present

## 2018-04-20 DIAGNOSIS — N186 End stage renal disease: Secondary | ICD-10-CM | POA: Diagnosis not present

## 2018-04-20 DIAGNOSIS — N2581 Secondary hyperparathyroidism of renal origin: Secondary | ICD-10-CM | POA: Diagnosis not present

## 2018-04-20 DIAGNOSIS — D631 Anemia in chronic kidney disease: Secondary | ICD-10-CM | POA: Diagnosis not present

## 2018-04-20 DIAGNOSIS — Z992 Dependence on renal dialysis: Secondary | ICD-10-CM | POA: Diagnosis not present

## 2018-04-22 DIAGNOSIS — N186 End stage renal disease: Secondary | ICD-10-CM | POA: Diagnosis not present

## 2018-04-22 DIAGNOSIS — Z992 Dependence on renal dialysis: Secondary | ICD-10-CM | POA: Diagnosis not present

## 2018-04-22 DIAGNOSIS — D509 Iron deficiency anemia, unspecified: Secondary | ICD-10-CM | POA: Diagnosis not present

## 2018-04-22 DIAGNOSIS — N2581 Secondary hyperparathyroidism of renal origin: Secondary | ICD-10-CM | POA: Diagnosis not present

## 2018-04-22 DIAGNOSIS — D631 Anemia in chronic kidney disease: Secondary | ICD-10-CM | POA: Diagnosis not present

## 2018-04-27 DIAGNOSIS — D631 Anemia in chronic kidney disease: Secondary | ICD-10-CM | POA: Diagnosis not present

## 2018-04-27 DIAGNOSIS — Z992 Dependence on renal dialysis: Secondary | ICD-10-CM | POA: Diagnosis not present

## 2018-04-27 DIAGNOSIS — D509 Iron deficiency anemia, unspecified: Secondary | ICD-10-CM | POA: Diagnosis not present

## 2018-04-27 DIAGNOSIS — N186 End stage renal disease: Secondary | ICD-10-CM | POA: Diagnosis not present

## 2018-04-27 DIAGNOSIS — N2581 Secondary hyperparathyroidism of renal origin: Secondary | ICD-10-CM | POA: Diagnosis not present

## 2018-04-29 DIAGNOSIS — N186 End stage renal disease: Secondary | ICD-10-CM | POA: Diagnosis not present

## 2018-04-29 DIAGNOSIS — Z992 Dependence on renal dialysis: Secondary | ICD-10-CM | POA: Diagnosis not present

## 2018-04-29 DIAGNOSIS — D509 Iron deficiency anemia, unspecified: Secondary | ICD-10-CM | POA: Diagnosis not present

## 2018-04-29 DIAGNOSIS — N2581 Secondary hyperparathyroidism of renal origin: Secondary | ICD-10-CM | POA: Diagnosis not present

## 2018-04-29 DIAGNOSIS — D631 Anemia in chronic kidney disease: Secondary | ICD-10-CM | POA: Diagnosis not present

## 2018-05-01 DIAGNOSIS — N186 End stage renal disease: Secondary | ICD-10-CM | POA: Diagnosis not present

## 2018-05-01 DIAGNOSIS — D509 Iron deficiency anemia, unspecified: Secondary | ICD-10-CM | POA: Diagnosis not present

## 2018-05-01 DIAGNOSIS — N2581 Secondary hyperparathyroidism of renal origin: Secondary | ICD-10-CM | POA: Diagnosis not present

## 2018-05-01 DIAGNOSIS — Z992 Dependence on renal dialysis: Secondary | ICD-10-CM | POA: Diagnosis not present

## 2018-05-01 DIAGNOSIS — D631 Anemia in chronic kidney disease: Secondary | ICD-10-CM | POA: Diagnosis not present

## 2018-05-04 DIAGNOSIS — D631 Anemia in chronic kidney disease: Secondary | ICD-10-CM | POA: Diagnosis not present

## 2018-05-04 DIAGNOSIS — Z992 Dependence on renal dialysis: Secondary | ICD-10-CM | POA: Diagnosis not present

## 2018-05-04 DIAGNOSIS — N186 End stage renal disease: Secondary | ICD-10-CM | POA: Diagnosis not present

## 2018-05-04 DIAGNOSIS — N2581 Secondary hyperparathyroidism of renal origin: Secondary | ICD-10-CM | POA: Diagnosis not present

## 2018-05-04 DIAGNOSIS — I129 Hypertensive chronic kidney disease with stage 1 through stage 4 chronic kidney disease, or unspecified chronic kidney disease: Secondary | ICD-10-CM | POA: Diagnosis not present

## 2018-05-06 DIAGNOSIS — Z992 Dependence on renal dialysis: Secondary | ICD-10-CM | POA: Diagnosis not present

## 2018-05-06 DIAGNOSIS — D631 Anemia in chronic kidney disease: Secondary | ICD-10-CM | POA: Diagnosis not present

## 2018-05-06 DIAGNOSIS — N186 End stage renal disease: Secondary | ICD-10-CM | POA: Diagnosis not present

## 2018-05-06 DIAGNOSIS — N2581 Secondary hyperparathyroidism of renal origin: Secondary | ICD-10-CM | POA: Diagnosis not present

## 2018-05-08 DIAGNOSIS — Z992 Dependence on renal dialysis: Secondary | ICD-10-CM | POA: Diagnosis not present

## 2018-05-08 DIAGNOSIS — D631 Anemia in chronic kidney disease: Secondary | ICD-10-CM | POA: Diagnosis not present

## 2018-05-08 DIAGNOSIS — N186 End stage renal disease: Secondary | ICD-10-CM | POA: Diagnosis not present

## 2018-05-08 DIAGNOSIS — N2581 Secondary hyperparathyroidism of renal origin: Secondary | ICD-10-CM | POA: Diagnosis not present

## 2018-05-11 DIAGNOSIS — N186 End stage renal disease: Secondary | ICD-10-CM | POA: Diagnosis not present

## 2018-05-11 DIAGNOSIS — D631 Anemia in chronic kidney disease: Secondary | ICD-10-CM | POA: Diagnosis not present

## 2018-05-11 DIAGNOSIS — N2581 Secondary hyperparathyroidism of renal origin: Secondary | ICD-10-CM | POA: Diagnosis not present

## 2018-05-11 DIAGNOSIS — Z992 Dependence on renal dialysis: Secondary | ICD-10-CM | POA: Diagnosis not present

## 2018-05-13 DIAGNOSIS — Z992 Dependence on renal dialysis: Secondary | ICD-10-CM | POA: Diagnosis not present

## 2018-05-13 DIAGNOSIS — N186 End stage renal disease: Secondary | ICD-10-CM | POA: Diagnosis not present

## 2018-05-13 DIAGNOSIS — D631 Anemia in chronic kidney disease: Secondary | ICD-10-CM | POA: Diagnosis not present

## 2018-05-13 DIAGNOSIS — N2581 Secondary hyperparathyroidism of renal origin: Secondary | ICD-10-CM | POA: Diagnosis not present

## 2018-05-15 DIAGNOSIS — N186 End stage renal disease: Secondary | ICD-10-CM | POA: Diagnosis not present

## 2018-05-15 DIAGNOSIS — D631 Anemia in chronic kidney disease: Secondary | ICD-10-CM | POA: Diagnosis not present

## 2018-05-15 DIAGNOSIS — N2581 Secondary hyperparathyroidism of renal origin: Secondary | ICD-10-CM | POA: Diagnosis not present

## 2018-05-15 DIAGNOSIS — Z992 Dependence on renal dialysis: Secondary | ICD-10-CM | POA: Diagnosis not present

## 2018-05-18 DIAGNOSIS — N2581 Secondary hyperparathyroidism of renal origin: Secondary | ICD-10-CM | POA: Diagnosis not present

## 2018-05-18 DIAGNOSIS — D631 Anemia in chronic kidney disease: Secondary | ICD-10-CM | POA: Diagnosis not present

## 2018-05-18 DIAGNOSIS — Z992 Dependence on renal dialysis: Secondary | ICD-10-CM | POA: Diagnosis not present

## 2018-05-18 DIAGNOSIS — N186 End stage renal disease: Secondary | ICD-10-CM | POA: Diagnosis not present

## 2018-05-20 DIAGNOSIS — N186 End stage renal disease: Secondary | ICD-10-CM | POA: Diagnosis not present

## 2018-05-20 DIAGNOSIS — N2581 Secondary hyperparathyroidism of renal origin: Secondary | ICD-10-CM | POA: Diagnosis not present

## 2018-05-20 DIAGNOSIS — Z992 Dependence on renal dialysis: Secondary | ICD-10-CM | POA: Diagnosis not present

## 2018-05-20 DIAGNOSIS — D631 Anemia in chronic kidney disease: Secondary | ICD-10-CM | POA: Diagnosis not present

## 2018-05-22 DIAGNOSIS — Z992 Dependence on renal dialysis: Secondary | ICD-10-CM | POA: Diagnosis not present

## 2018-05-22 DIAGNOSIS — N2581 Secondary hyperparathyroidism of renal origin: Secondary | ICD-10-CM | POA: Diagnosis not present

## 2018-05-22 DIAGNOSIS — D631 Anemia in chronic kidney disease: Secondary | ICD-10-CM | POA: Diagnosis not present

## 2018-05-22 DIAGNOSIS — N186 End stage renal disease: Secondary | ICD-10-CM | POA: Diagnosis not present

## 2018-05-25 DIAGNOSIS — N186 End stage renal disease: Secondary | ICD-10-CM | POA: Diagnosis not present

## 2018-05-25 DIAGNOSIS — N2581 Secondary hyperparathyroidism of renal origin: Secondary | ICD-10-CM | POA: Diagnosis not present

## 2018-05-25 DIAGNOSIS — Z992 Dependence on renal dialysis: Secondary | ICD-10-CM | POA: Diagnosis not present

## 2018-05-25 DIAGNOSIS — D631 Anemia in chronic kidney disease: Secondary | ICD-10-CM | POA: Diagnosis not present

## 2018-05-27 DIAGNOSIS — N2581 Secondary hyperparathyroidism of renal origin: Secondary | ICD-10-CM | POA: Diagnosis not present

## 2018-05-27 DIAGNOSIS — Z992 Dependence on renal dialysis: Secondary | ICD-10-CM | POA: Diagnosis not present

## 2018-05-27 DIAGNOSIS — D631 Anemia in chronic kidney disease: Secondary | ICD-10-CM | POA: Diagnosis not present

## 2018-05-27 DIAGNOSIS — N186 End stage renal disease: Secondary | ICD-10-CM | POA: Diagnosis not present

## 2018-05-29 DIAGNOSIS — D631 Anemia in chronic kidney disease: Secondary | ICD-10-CM | POA: Diagnosis not present

## 2018-05-29 DIAGNOSIS — Z992 Dependence on renal dialysis: Secondary | ICD-10-CM | POA: Diagnosis not present

## 2018-05-29 DIAGNOSIS — N2581 Secondary hyperparathyroidism of renal origin: Secondary | ICD-10-CM | POA: Diagnosis not present

## 2018-05-29 DIAGNOSIS — N186 End stage renal disease: Secondary | ICD-10-CM | POA: Diagnosis not present

## 2018-06-01 DIAGNOSIS — N186 End stage renal disease: Secondary | ICD-10-CM | POA: Diagnosis not present

## 2018-06-01 DIAGNOSIS — N2581 Secondary hyperparathyroidism of renal origin: Secondary | ICD-10-CM | POA: Diagnosis not present

## 2018-06-01 DIAGNOSIS — D631 Anemia in chronic kidney disease: Secondary | ICD-10-CM | POA: Diagnosis not present

## 2018-06-01 DIAGNOSIS — Z992 Dependence on renal dialysis: Secondary | ICD-10-CM | POA: Diagnosis not present

## 2018-06-03 DIAGNOSIS — N186 End stage renal disease: Secondary | ICD-10-CM | POA: Diagnosis not present

## 2018-06-03 DIAGNOSIS — D631 Anemia in chronic kidney disease: Secondary | ICD-10-CM | POA: Diagnosis not present

## 2018-06-03 DIAGNOSIS — Z992 Dependence on renal dialysis: Secondary | ICD-10-CM | POA: Diagnosis not present

## 2018-06-03 DIAGNOSIS — N2581 Secondary hyperparathyroidism of renal origin: Secondary | ICD-10-CM | POA: Diagnosis not present

## 2018-06-04 DIAGNOSIS — Z992 Dependence on renal dialysis: Secondary | ICD-10-CM | POA: Diagnosis not present

## 2018-06-04 DIAGNOSIS — I129 Hypertensive chronic kidney disease with stage 1 through stage 4 chronic kidney disease, or unspecified chronic kidney disease: Secondary | ICD-10-CM | POA: Diagnosis not present

## 2018-06-04 DIAGNOSIS — N186 End stage renal disease: Secondary | ICD-10-CM | POA: Diagnosis not present

## 2018-06-05 DIAGNOSIS — Z992 Dependence on renal dialysis: Secondary | ICD-10-CM | POA: Diagnosis not present

## 2018-06-05 DIAGNOSIS — N186 End stage renal disease: Secondary | ICD-10-CM | POA: Diagnosis not present

## 2018-06-05 DIAGNOSIS — D631 Anemia in chronic kidney disease: Secondary | ICD-10-CM | POA: Diagnosis not present

## 2018-06-05 DIAGNOSIS — D509 Iron deficiency anemia, unspecified: Secondary | ICD-10-CM | POA: Diagnosis not present

## 2018-06-05 DIAGNOSIS — N2581 Secondary hyperparathyroidism of renal origin: Secondary | ICD-10-CM | POA: Diagnosis not present

## 2018-06-08 DIAGNOSIS — N2581 Secondary hyperparathyroidism of renal origin: Secondary | ICD-10-CM | POA: Diagnosis not present

## 2018-06-08 DIAGNOSIS — N186 End stage renal disease: Secondary | ICD-10-CM | POA: Diagnosis not present

## 2018-06-08 DIAGNOSIS — D509 Iron deficiency anemia, unspecified: Secondary | ICD-10-CM | POA: Diagnosis not present

## 2018-06-08 DIAGNOSIS — D631 Anemia in chronic kidney disease: Secondary | ICD-10-CM | POA: Diagnosis not present

## 2018-06-08 DIAGNOSIS — Z992 Dependence on renal dialysis: Secondary | ICD-10-CM | POA: Diagnosis not present

## 2018-06-10 DIAGNOSIS — D631 Anemia in chronic kidney disease: Secondary | ICD-10-CM | POA: Diagnosis not present

## 2018-06-10 DIAGNOSIS — N2581 Secondary hyperparathyroidism of renal origin: Secondary | ICD-10-CM | POA: Diagnosis not present

## 2018-06-10 DIAGNOSIS — D509 Iron deficiency anemia, unspecified: Secondary | ICD-10-CM | POA: Diagnosis not present

## 2018-06-10 DIAGNOSIS — N186 End stage renal disease: Secondary | ICD-10-CM | POA: Diagnosis not present

## 2018-06-10 DIAGNOSIS — Z992 Dependence on renal dialysis: Secondary | ICD-10-CM | POA: Diagnosis not present

## 2018-06-12 DIAGNOSIS — D509 Iron deficiency anemia, unspecified: Secondary | ICD-10-CM | POA: Diagnosis not present

## 2018-06-12 DIAGNOSIS — D631 Anemia in chronic kidney disease: Secondary | ICD-10-CM | POA: Diagnosis not present

## 2018-06-12 DIAGNOSIS — N2581 Secondary hyperparathyroidism of renal origin: Secondary | ICD-10-CM | POA: Diagnosis not present

## 2018-06-12 DIAGNOSIS — Z992 Dependence on renal dialysis: Secondary | ICD-10-CM | POA: Diagnosis not present

## 2018-06-12 DIAGNOSIS — N186 End stage renal disease: Secondary | ICD-10-CM | POA: Diagnosis not present

## 2018-06-15 DIAGNOSIS — D631 Anemia in chronic kidney disease: Secondary | ICD-10-CM | POA: Diagnosis not present

## 2018-06-15 DIAGNOSIS — Z992 Dependence on renal dialysis: Secondary | ICD-10-CM | POA: Diagnosis not present

## 2018-06-15 DIAGNOSIS — N186 End stage renal disease: Secondary | ICD-10-CM | POA: Diagnosis not present

## 2018-06-15 DIAGNOSIS — N2581 Secondary hyperparathyroidism of renal origin: Secondary | ICD-10-CM | POA: Diagnosis not present

## 2018-06-15 DIAGNOSIS — D509 Iron deficiency anemia, unspecified: Secondary | ICD-10-CM | POA: Diagnosis not present

## 2018-06-17 DIAGNOSIS — D631 Anemia in chronic kidney disease: Secondary | ICD-10-CM | POA: Diagnosis not present

## 2018-06-17 DIAGNOSIS — N2581 Secondary hyperparathyroidism of renal origin: Secondary | ICD-10-CM | POA: Diagnosis not present

## 2018-06-17 DIAGNOSIS — D509 Iron deficiency anemia, unspecified: Secondary | ICD-10-CM | POA: Diagnosis not present

## 2018-06-17 DIAGNOSIS — N186 End stage renal disease: Secondary | ICD-10-CM | POA: Diagnosis not present

## 2018-06-17 DIAGNOSIS — Z992 Dependence on renal dialysis: Secondary | ICD-10-CM | POA: Diagnosis not present

## 2018-06-19 DIAGNOSIS — D631 Anemia in chronic kidney disease: Secondary | ICD-10-CM | POA: Diagnosis not present

## 2018-06-19 DIAGNOSIS — D509 Iron deficiency anemia, unspecified: Secondary | ICD-10-CM | POA: Diagnosis not present

## 2018-06-19 DIAGNOSIS — N186 End stage renal disease: Secondary | ICD-10-CM | POA: Diagnosis not present

## 2018-06-19 DIAGNOSIS — Z992 Dependence on renal dialysis: Secondary | ICD-10-CM | POA: Diagnosis not present

## 2018-06-19 DIAGNOSIS — N2581 Secondary hyperparathyroidism of renal origin: Secondary | ICD-10-CM | POA: Diagnosis not present

## 2018-06-22 DIAGNOSIS — Z992 Dependence on renal dialysis: Secondary | ICD-10-CM | POA: Diagnosis not present

## 2018-06-22 DIAGNOSIS — N186 End stage renal disease: Secondary | ICD-10-CM | POA: Diagnosis not present

## 2018-06-22 DIAGNOSIS — D509 Iron deficiency anemia, unspecified: Secondary | ICD-10-CM | POA: Diagnosis not present

## 2018-06-22 DIAGNOSIS — N2581 Secondary hyperparathyroidism of renal origin: Secondary | ICD-10-CM | POA: Diagnosis not present

## 2018-06-22 DIAGNOSIS — D631 Anemia in chronic kidney disease: Secondary | ICD-10-CM | POA: Diagnosis not present

## 2018-06-23 ENCOUNTER — Encounter: Payer: Self-pay | Admitting: Vascular Surgery

## 2018-06-23 ENCOUNTER — Ambulatory Visit: Payer: Medicare Other | Admitting: Vascular Surgery

## 2018-06-24 DIAGNOSIS — N2581 Secondary hyperparathyroidism of renal origin: Secondary | ICD-10-CM | POA: Diagnosis not present

## 2018-06-24 DIAGNOSIS — N186 End stage renal disease: Secondary | ICD-10-CM | POA: Diagnosis not present

## 2018-06-24 DIAGNOSIS — Z992 Dependence on renal dialysis: Secondary | ICD-10-CM | POA: Diagnosis not present

## 2018-06-24 DIAGNOSIS — D509 Iron deficiency anemia, unspecified: Secondary | ICD-10-CM | POA: Diagnosis not present

## 2018-06-24 DIAGNOSIS — D631 Anemia in chronic kidney disease: Secondary | ICD-10-CM | POA: Diagnosis not present

## 2018-06-26 DIAGNOSIS — D631 Anemia in chronic kidney disease: Secondary | ICD-10-CM | POA: Diagnosis not present

## 2018-06-26 DIAGNOSIS — Z992 Dependence on renal dialysis: Secondary | ICD-10-CM | POA: Diagnosis not present

## 2018-06-26 DIAGNOSIS — N186 End stage renal disease: Secondary | ICD-10-CM | POA: Diagnosis not present

## 2018-06-26 DIAGNOSIS — D509 Iron deficiency anemia, unspecified: Secondary | ICD-10-CM | POA: Diagnosis not present

## 2018-06-26 DIAGNOSIS — N2581 Secondary hyperparathyroidism of renal origin: Secondary | ICD-10-CM | POA: Diagnosis not present

## 2018-06-29 DIAGNOSIS — Z992 Dependence on renal dialysis: Secondary | ICD-10-CM | POA: Diagnosis not present

## 2018-06-29 DIAGNOSIS — D509 Iron deficiency anemia, unspecified: Secondary | ICD-10-CM | POA: Diagnosis not present

## 2018-06-29 DIAGNOSIS — D631 Anemia in chronic kidney disease: Secondary | ICD-10-CM | POA: Diagnosis not present

## 2018-06-29 DIAGNOSIS — N2581 Secondary hyperparathyroidism of renal origin: Secondary | ICD-10-CM | POA: Diagnosis not present

## 2018-06-29 DIAGNOSIS — N186 End stage renal disease: Secondary | ICD-10-CM | POA: Diagnosis not present

## 2018-07-01 DIAGNOSIS — D509 Iron deficiency anemia, unspecified: Secondary | ICD-10-CM | POA: Diagnosis not present

## 2018-07-01 DIAGNOSIS — N186 End stage renal disease: Secondary | ICD-10-CM | POA: Diagnosis not present

## 2018-07-01 DIAGNOSIS — D631 Anemia in chronic kidney disease: Secondary | ICD-10-CM | POA: Diagnosis not present

## 2018-07-01 DIAGNOSIS — Z992 Dependence on renal dialysis: Secondary | ICD-10-CM | POA: Diagnosis not present

## 2018-07-01 DIAGNOSIS — N2581 Secondary hyperparathyroidism of renal origin: Secondary | ICD-10-CM | POA: Diagnosis not present

## 2018-07-03 DIAGNOSIS — D509 Iron deficiency anemia, unspecified: Secondary | ICD-10-CM | POA: Diagnosis not present

## 2018-07-03 DIAGNOSIS — N186 End stage renal disease: Secondary | ICD-10-CM | POA: Diagnosis not present

## 2018-07-03 DIAGNOSIS — Z992 Dependence on renal dialysis: Secondary | ICD-10-CM | POA: Diagnosis not present

## 2018-07-03 DIAGNOSIS — N2581 Secondary hyperparathyroidism of renal origin: Secondary | ICD-10-CM | POA: Diagnosis not present

## 2018-07-03 DIAGNOSIS — D631 Anemia in chronic kidney disease: Secondary | ICD-10-CM | POA: Diagnosis not present

## 2018-07-05 DIAGNOSIS — I129 Hypertensive chronic kidney disease with stage 1 through stage 4 chronic kidney disease, or unspecified chronic kidney disease: Secondary | ICD-10-CM | POA: Diagnosis not present

## 2018-07-05 DIAGNOSIS — Z992 Dependence on renal dialysis: Secondary | ICD-10-CM | POA: Diagnosis not present

## 2018-07-05 DIAGNOSIS — N186 End stage renal disease: Secondary | ICD-10-CM | POA: Diagnosis not present

## 2018-07-06 DIAGNOSIS — N2581 Secondary hyperparathyroidism of renal origin: Secondary | ICD-10-CM | POA: Diagnosis not present

## 2018-07-06 DIAGNOSIS — D509 Iron deficiency anemia, unspecified: Secondary | ICD-10-CM | POA: Diagnosis not present

## 2018-07-06 DIAGNOSIS — N186 End stage renal disease: Secondary | ICD-10-CM | POA: Diagnosis not present

## 2018-07-06 DIAGNOSIS — Z992 Dependence on renal dialysis: Secondary | ICD-10-CM | POA: Diagnosis not present

## 2018-07-06 DIAGNOSIS — D631 Anemia in chronic kidney disease: Secondary | ICD-10-CM | POA: Diagnosis not present

## 2018-07-08 DIAGNOSIS — D509 Iron deficiency anemia, unspecified: Secondary | ICD-10-CM | POA: Diagnosis not present

## 2018-07-08 DIAGNOSIS — N2581 Secondary hyperparathyroidism of renal origin: Secondary | ICD-10-CM | POA: Diagnosis not present

## 2018-07-08 DIAGNOSIS — Z992 Dependence on renal dialysis: Secondary | ICD-10-CM | POA: Diagnosis not present

## 2018-07-08 DIAGNOSIS — N186 End stage renal disease: Secondary | ICD-10-CM | POA: Diagnosis not present

## 2018-07-08 DIAGNOSIS — D631 Anemia in chronic kidney disease: Secondary | ICD-10-CM | POA: Diagnosis not present

## 2018-07-09 DIAGNOSIS — N186 End stage renal disease: Secondary | ICD-10-CM | POA: Diagnosis not present

## 2018-07-09 DIAGNOSIS — N2581 Secondary hyperparathyroidism of renal origin: Secondary | ICD-10-CM | POA: Diagnosis not present

## 2018-07-09 DIAGNOSIS — D509 Iron deficiency anemia, unspecified: Secondary | ICD-10-CM | POA: Diagnosis not present

## 2018-07-09 DIAGNOSIS — Z992 Dependence on renal dialysis: Secondary | ICD-10-CM | POA: Diagnosis not present

## 2018-07-09 DIAGNOSIS — D631 Anemia in chronic kidney disease: Secondary | ICD-10-CM | POA: Diagnosis not present

## 2018-07-13 DIAGNOSIS — N186 End stage renal disease: Secondary | ICD-10-CM | POA: Diagnosis not present

## 2018-07-13 DIAGNOSIS — D509 Iron deficiency anemia, unspecified: Secondary | ICD-10-CM | POA: Diagnosis not present

## 2018-07-13 DIAGNOSIS — Z992 Dependence on renal dialysis: Secondary | ICD-10-CM | POA: Diagnosis not present

## 2018-07-13 DIAGNOSIS — D631 Anemia in chronic kidney disease: Secondary | ICD-10-CM | POA: Diagnosis not present

## 2018-07-13 DIAGNOSIS — N2581 Secondary hyperparathyroidism of renal origin: Secondary | ICD-10-CM | POA: Diagnosis not present

## 2018-07-15 DIAGNOSIS — N186 End stage renal disease: Secondary | ICD-10-CM | POA: Diagnosis not present

## 2018-07-15 DIAGNOSIS — Z992 Dependence on renal dialysis: Secondary | ICD-10-CM | POA: Diagnosis not present

## 2018-07-15 DIAGNOSIS — D631 Anemia in chronic kidney disease: Secondary | ICD-10-CM | POA: Diagnosis not present

## 2018-07-15 DIAGNOSIS — D509 Iron deficiency anemia, unspecified: Secondary | ICD-10-CM | POA: Diagnosis not present

## 2018-07-15 DIAGNOSIS — N2581 Secondary hyperparathyroidism of renal origin: Secondary | ICD-10-CM | POA: Diagnosis not present

## 2018-07-17 DIAGNOSIS — D509 Iron deficiency anemia, unspecified: Secondary | ICD-10-CM | POA: Diagnosis not present

## 2018-07-17 DIAGNOSIS — D631 Anemia in chronic kidney disease: Secondary | ICD-10-CM | POA: Diagnosis not present

## 2018-07-17 DIAGNOSIS — N2581 Secondary hyperparathyroidism of renal origin: Secondary | ICD-10-CM | POA: Diagnosis not present

## 2018-07-17 DIAGNOSIS — N186 End stage renal disease: Secondary | ICD-10-CM | POA: Diagnosis not present

## 2018-07-17 DIAGNOSIS — Z992 Dependence on renal dialysis: Secondary | ICD-10-CM | POA: Diagnosis not present

## 2018-07-20 DIAGNOSIS — N186 End stage renal disease: Secondary | ICD-10-CM | POA: Diagnosis not present

## 2018-07-20 DIAGNOSIS — N2581 Secondary hyperparathyroidism of renal origin: Secondary | ICD-10-CM | POA: Diagnosis not present

## 2018-07-20 DIAGNOSIS — D509 Iron deficiency anemia, unspecified: Secondary | ICD-10-CM | POA: Diagnosis not present

## 2018-07-20 DIAGNOSIS — D631 Anemia in chronic kidney disease: Secondary | ICD-10-CM | POA: Diagnosis not present

## 2018-07-20 DIAGNOSIS — Z992 Dependence on renal dialysis: Secondary | ICD-10-CM | POA: Diagnosis not present

## 2018-07-22 DIAGNOSIS — N186 End stage renal disease: Secondary | ICD-10-CM | POA: Diagnosis not present

## 2018-07-22 DIAGNOSIS — Z992 Dependence on renal dialysis: Secondary | ICD-10-CM | POA: Diagnosis not present

## 2018-07-22 DIAGNOSIS — D509 Iron deficiency anemia, unspecified: Secondary | ICD-10-CM | POA: Diagnosis not present

## 2018-07-22 DIAGNOSIS — D631 Anemia in chronic kidney disease: Secondary | ICD-10-CM | POA: Diagnosis not present

## 2018-07-22 DIAGNOSIS — N2581 Secondary hyperparathyroidism of renal origin: Secondary | ICD-10-CM | POA: Diagnosis not present

## 2018-07-24 DIAGNOSIS — D509 Iron deficiency anemia, unspecified: Secondary | ICD-10-CM | POA: Diagnosis not present

## 2018-07-24 DIAGNOSIS — N186 End stage renal disease: Secondary | ICD-10-CM | POA: Diagnosis not present

## 2018-07-24 DIAGNOSIS — D631 Anemia in chronic kidney disease: Secondary | ICD-10-CM | POA: Diagnosis not present

## 2018-07-24 DIAGNOSIS — Z992 Dependence on renal dialysis: Secondary | ICD-10-CM | POA: Diagnosis not present

## 2018-07-24 DIAGNOSIS — N2581 Secondary hyperparathyroidism of renal origin: Secondary | ICD-10-CM | POA: Diagnosis not present

## 2018-07-27 DIAGNOSIS — Z992 Dependence on renal dialysis: Secondary | ICD-10-CM | POA: Diagnosis not present

## 2018-07-27 DIAGNOSIS — N2581 Secondary hyperparathyroidism of renal origin: Secondary | ICD-10-CM | POA: Diagnosis not present

## 2018-07-27 DIAGNOSIS — D509 Iron deficiency anemia, unspecified: Secondary | ICD-10-CM | POA: Diagnosis not present

## 2018-07-27 DIAGNOSIS — N186 End stage renal disease: Secondary | ICD-10-CM | POA: Diagnosis not present

## 2018-07-27 DIAGNOSIS — D631 Anemia in chronic kidney disease: Secondary | ICD-10-CM | POA: Diagnosis not present

## 2018-07-28 ENCOUNTER — Ambulatory Visit (INDEPENDENT_AMBULATORY_CARE_PROVIDER_SITE_OTHER): Payer: Medicare Other | Admitting: Podiatry

## 2018-07-28 ENCOUNTER — Encounter: Payer: Self-pay | Admitting: Podiatry

## 2018-07-28 DIAGNOSIS — L600 Ingrowing nail: Secondary | ICD-10-CM

## 2018-07-28 DIAGNOSIS — B351 Tinea unguium: Secondary | ICD-10-CM

## 2018-07-28 NOTE — Patient Instructions (Signed)

## 2018-07-29 DIAGNOSIS — D631 Anemia in chronic kidney disease: Secondary | ICD-10-CM | POA: Diagnosis not present

## 2018-07-29 DIAGNOSIS — N2581 Secondary hyperparathyroidism of renal origin: Secondary | ICD-10-CM | POA: Diagnosis not present

## 2018-07-29 DIAGNOSIS — N186 End stage renal disease: Secondary | ICD-10-CM | POA: Diagnosis not present

## 2018-07-29 DIAGNOSIS — D509 Iron deficiency anemia, unspecified: Secondary | ICD-10-CM | POA: Diagnosis not present

## 2018-07-29 DIAGNOSIS — Z992 Dependence on renal dialysis: Secondary | ICD-10-CM | POA: Diagnosis not present

## 2018-07-29 NOTE — Progress Notes (Signed)
Subjective: 47 year old male presents the office today for concerns of a recurrent anatomical right big toe, medial aspect is been ongoing for 1 month.  He previously had the same area removed almost exactly a year ago with Dr. Paulla Dolly and did well up in the last month or so becoming more painful and he cannot wear shoes because of this and he wants to have the area fixed again. Denies any systemic complaints such as fevers, chills, nausea, vomiting. No acute changes since last appointment, and no other complaints at this time.   Objective: AAO x3, NAD DP/PT pulses palpable 2/4 bilaterally, CRT less than 3 seconds, pedal hair present and there is a normal proximal distal temperature gradient There is significant incurvation present to the medial aspect the right hallux toenail today there is tenderness palpation of the area.  There is very minimal edema there is no erythema increased warmth.  No other areas of tenderness.  Overall his nails are hypertrophic, dystrophic with ill-defined discoloration.  No pain to the other nails there is no signs of infection the other toenails.  No open lesions or pre-ulcerative lesions.  No pain with calf compression, swelling, warmth, erythema  Assessment: Ingrown toenail right medial hallux with onychomycosis  Plan: -All treatment options discussed with the patient including all alternatives, risks, complications.  -At this time, the patient is requesting partial nail removal with chemical matricectomy to the symptomatic portion of the nail. Risks and complications were discussed with the patient for which they understand and written consent was obtained. Under sterile conditions a total of 3 mL of a mixture of 2% lidocaine plain and 0.5% Marcaine plain was infiltrated in a hallux block fashion. Once anesthetized, the skin was prepped in sterile fashion. A tourniquet was then applied. Next the medial aspect of hallux nail border was then sharply excised making sure to  remove the entire offending nail border. Once the nails were ensured to be removed area was debrided and the underlying skin was intact. There is no purulence identified in the procedure. Next phenol was then applied under standard conditions and copiously irrigated. Silvadene was applied. A dry sterile dressing was applied. After application of the dressing the tourniquet was removed and there is found to be an immediate capillary refill time to the digit. The patient tolerated the procedure well any complications. Post procedure instructions were discussed the patient for which he verbally understood. Follow-up in one week for nail check or sooner if any problems are to arise. Discussed signs/symptoms of infection and directed to call the office immediately should any occur or go directly to the emergency room. In the meantime, encouraged to call the office with any questions, concerns, changes symptoms. -I ordered a compound cream through [employer] for onychomycosis and instructed on use to hold off on the right hallux until it is healed.  This was faxed today. -Patient encouraged to call the office with any questions, concerns, change in symptoms.   Trula Slade DPM

## 2018-07-31 DIAGNOSIS — Z992 Dependence on renal dialysis: Secondary | ICD-10-CM | POA: Diagnosis not present

## 2018-07-31 DIAGNOSIS — N2581 Secondary hyperparathyroidism of renal origin: Secondary | ICD-10-CM | POA: Diagnosis not present

## 2018-07-31 DIAGNOSIS — D631 Anemia in chronic kidney disease: Secondary | ICD-10-CM | POA: Diagnosis not present

## 2018-07-31 DIAGNOSIS — D509 Iron deficiency anemia, unspecified: Secondary | ICD-10-CM | POA: Diagnosis not present

## 2018-07-31 DIAGNOSIS — N186 End stage renal disease: Secondary | ICD-10-CM | POA: Diagnosis not present

## 2018-08-03 ENCOUNTER — Telehealth: Payer: Self-pay | Admitting: *Deleted

## 2018-08-03 DIAGNOSIS — N2581 Secondary hyperparathyroidism of renal origin: Secondary | ICD-10-CM | POA: Diagnosis not present

## 2018-08-03 DIAGNOSIS — D631 Anemia in chronic kidney disease: Secondary | ICD-10-CM | POA: Diagnosis not present

## 2018-08-03 DIAGNOSIS — D509 Iron deficiency anemia, unspecified: Secondary | ICD-10-CM | POA: Diagnosis not present

## 2018-08-03 DIAGNOSIS — N186 End stage renal disease: Secondary | ICD-10-CM | POA: Diagnosis not present

## 2018-08-03 DIAGNOSIS — Z992 Dependence on renal dialysis: Secondary | ICD-10-CM | POA: Diagnosis not present

## 2018-08-03 MED ORDER — NONFORMULARY OR COMPOUNDED ITEM: 11 refills | Status: DC

## 2018-08-03 NOTE — Telephone Encounter (Signed)
I spoke with [place] and she states she did not receive the onychomycosis nail lacquer rx. I told her I would fax right away. Faxed rx to [employer].

## 2018-08-03 NOTE — Telephone Encounter (Signed)
I called pt and he states he called the [phone] number 5 times and no one answered just a message machine. I told pt that was the time he was to leave a message so someone could call him back. I asked pt what medication he was calling for and he said for some kinda of thickness, fungus. I reviewed the clinicals and Dr. Jacqualyn Posey had ordered [employer] Onychomycosis Nail Lacquer. I told pt I would call [employer] and check the status, and they would call him with the coverage and the delivery information. I told pt that Dr. Jacqualyn Posey did not want him applying to the right big toenail until the area had healed. Pt states he wants it for all of his nails and I told him that would be fine.

## 2018-08-03 NOTE — Telephone Encounter (Signed)
Marvin Roberson - Billing Coordinator states pt says he has called 5 times within a hour and no one has called him back.

## 2018-08-04 ENCOUNTER — Ambulatory Visit (INDEPENDENT_AMBULATORY_CARE_PROVIDER_SITE_OTHER): Payer: Medicare Other

## 2018-08-04 DIAGNOSIS — I129 Hypertensive chronic kidney disease with stage 1 through stage 4 chronic kidney disease, or unspecified chronic kidney disease: Secondary | ICD-10-CM | POA: Diagnosis not present

## 2018-08-04 DIAGNOSIS — N186 End stage renal disease: Secondary | ICD-10-CM | POA: Diagnosis not present

## 2018-08-04 DIAGNOSIS — Z992 Dependence on renal dialysis: Secondary | ICD-10-CM | POA: Diagnosis not present

## 2018-08-04 DIAGNOSIS — B351 Tinea unguium: Secondary | ICD-10-CM

## 2018-08-04 NOTE — Patient Instructions (Signed)

## 2018-08-05 DIAGNOSIS — D509 Iron deficiency anemia, unspecified: Secondary | ICD-10-CM | POA: Diagnosis not present

## 2018-08-05 DIAGNOSIS — D631 Anemia in chronic kidney disease: Secondary | ICD-10-CM | POA: Diagnosis not present

## 2018-08-05 DIAGNOSIS — N2581 Secondary hyperparathyroidism of renal origin: Secondary | ICD-10-CM | POA: Diagnosis not present

## 2018-08-05 DIAGNOSIS — E875 Hyperkalemia: Secondary | ICD-10-CM | POA: Diagnosis not present

## 2018-08-05 DIAGNOSIS — D689 Coagulation defect, unspecified: Secondary | ICD-10-CM | POA: Diagnosis not present

## 2018-08-05 DIAGNOSIS — Z992 Dependence on renal dialysis: Secondary | ICD-10-CM | POA: Diagnosis not present

## 2018-08-05 DIAGNOSIS — N186 End stage renal disease: Secondary | ICD-10-CM | POA: Diagnosis not present

## 2018-08-05 NOTE — Progress Notes (Signed)
Patient is here today for follow-up appointment, procedure performed on 07/28/2018, removal of right medial nail border.  He states that he is not having any issues with it, and is not having any pain at this time.  No redness, no erythema, no drainage, no other signs and symptoms of infection.  Overall the area is beginning to heal well and is scabbing over at this time.  Discussed signs and symptoms of infection.  Verbal and written instructions were given to patient.  He is to follow-up with any acute symptom changes.

## 2018-08-07 DIAGNOSIS — E875 Hyperkalemia: Secondary | ICD-10-CM | POA: Diagnosis not present

## 2018-08-07 DIAGNOSIS — N186 End stage renal disease: Secondary | ICD-10-CM | POA: Diagnosis not present

## 2018-08-07 DIAGNOSIS — D689 Coagulation defect, unspecified: Secondary | ICD-10-CM | POA: Diagnosis not present

## 2018-08-07 DIAGNOSIS — D509 Iron deficiency anemia, unspecified: Secondary | ICD-10-CM | POA: Diagnosis not present

## 2018-08-07 DIAGNOSIS — D631 Anemia in chronic kidney disease: Secondary | ICD-10-CM | POA: Diagnosis not present

## 2018-08-07 DIAGNOSIS — N2581 Secondary hyperparathyroidism of renal origin: Secondary | ICD-10-CM | POA: Diagnosis not present

## 2018-08-10 DIAGNOSIS — E875 Hyperkalemia: Secondary | ICD-10-CM | POA: Diagnosis not present

## 2018-08-10 DIAGNOSIS — N186 End stage renal disease: Secondary | ICD-10-CM | POA: Diagnosis not present

## 2018-08-10 DIAGNOSIS — N2581 Secondary hyperparathyroidism of renal origin: Secondary | ICD-10-CM | POA: Diagnosis not present

## 2018-08-10 DIAGNOSIS — D631 Anemia in chronic kidney disease: Secondary | ICD-10-CM | POA: Diagnosis not present

## 2018-08-10 DIAGNOSIS — D509 Iron deficiency anemia, unspecified: Secondary | ICD-10-CM | POA: Diagnosis not present

## 2018-08-10 DIAGNOSIS — D689 Coagulation defect, unspecified: Secondary | ICD-10-CM | POA: Diagnosis not present

## 2018-08-12 ENCOUNTER — Observation Stay (HOSPITAL_COMMUNITY)
Admission: EM | Admit: 2018-08-12 | Discharge: 2018-08-13 | Discharge status: Against Medical Advice | Payer: Medicare Other | Attending: Internal Medicine | Admitting: Internal Medicine

## 2018-08-12 ENCOUNTER — Encounter (HOSPITAL_COMMUNITY): Payer: Self-pay | Admitting: Emergency Medicine

## 2018-08-12 ENCOUNTER — Other Ambulatory Visit: Payer: Self-pay

## 2018-08-12 DIAGNOSIS — E160 Drug-induced hypoglycemia without coma: Secondary | ICD-10-CM | POA: Diagnosis present

## 2018-08-12 DIAGNOSIS — E2740 Unspecified adrenocortical insufficiency: Secondary | ICD-10-CM | POA: Diagnosis not present

## 2018-08-12 DIAGNOSIS — Z5329 Procedure and treatment not carried out because of patient's decision for other reasons: Secondary | ICD-10-CM | POA: Insufficient documentation

## 2018-08-12 DIAGNOSIS — Z79899 Other long term (current) drug therapy: Secondary | ICD-10-CM | POA: Insufficient documentation

## 2018-08-12 DIAGNOSIS — R110 Nausea: Secondary | ICD-10-CM | POA: Diagnosis not present

## 2018-08-12 DIAGNOSIS — K219 Gastro-esophageal reflux disease without esophagitis: Secondary | ICD-10-CM | POA: Insufficient documentation

## 2018-08-12 DIAGNOSIS — G43909 Migraine, unspecified, not intractable, without status migrainosus: Secondary | ICD-10-CM | POA: Diagnosis not present

## 2018-08-12 DIAGNOSIS — Z7982 Long term (current) use of aspirin: Secondary | ICD-10-CM | POA: Diagnosis not present

## 2018-08-12 DIAGNOSIS — Z8673 Personal history of transient ischemic attack (TIA), and cerebral infarction without residual deficits: Secondary | ICD-10-CM | POA: Insufficient documentation

## 2018-08-12 DIAGNOSIS — E162 Hypoglycemia, unspecified: Secondary | ICD-10-CM | POA: Diagnosis not present

## 2018-08-12 DIAGNOSIS — F17210 Nicotine dependence, cigarettes, uncomplicated: Secondary | ICD-10-CM | POA: Insufficient documentation

## 2018-08-12 DIAGNOSIS — D696 Thrombocytopenia, unspecified: Secondary | ICD-10-CM | POA: Diagnosis not present

## 2018-08-12 DIAGNOSIS — R509 Fever, unspecified: Secondary | ICD-10-CM | POA: Insufficient documentation

## 2018-08-12 DIAGNOSIS — T383X5A Adverse effect of insulin and oral hypoglycemic [antidiabetic] drugs, initial encounter: Secondary | ICD-10-CM

## 2018-08-12 DIAGNOSIS — E875 Hyperkalemia: Secondary | ICD-10-CM | POA: Diagnosis present

## 2018-08-12 DIAGNOSIS — I120 Hypertensive chronic kidney disease with stage 5 chronic kidney disease or end stage renal disease: Principal | ICD-10-CM | POA: Insufficient documentation

## 2018-08-12 DIAGNOSIS — E7800 Pure hypercholesterolemia, unspecified: Secondary | ICD-10-CM | POA: Insufficient documentation

## 2018-08-12 DIAGNOSIS — Z992 Dependence on renal dialysis: Secondary | ICD-10-CM | POA: Diagnosis not present

## 2018-08-12 DIAGNOSIS — D631 Anemia in chronic kidney disease: Secondary | ICD-10-CM | POA: Insufficient documentation

## 2018-08-12 DIAGNOSIS — N186 End stage renal disease: Secondary | ICD-10-CM | POA: Diagnosis not present

## 2018-08-12 DIAGNOSIS — I10 Essential (primary) hypertension: Secondary | ICD-10-CM | POA: Diagnosis present

## 2018-08-12 DIAGNOSIS — R9431 Abnormal electrocardiogram [ECG] [EKG]: Secondary | ICD-10-CM | POA: Diagnosis not present

## 2018-08-12 DIAGNOSIS — R5381 Other malaise: Secondary | ICD-10-CM | POA: Insufficient documentation

## 2018-08-12 LAB — CBC WITH DIFFERENTIAL/PLATELET
Abs Immature Granulocytes: 0.01 10*3/uL (ref 0.00–0.07)
BASOS ABS: 0.1 10*3/uL (ref 0.0–0.1)
BASOS PCT: 1 %
Eosinophils Absolute: 0.5 10*3/uL (ref 0.0–0.5)
Eosinophils Relative: 11 %
HCT: 34.8 % — ABNORMAL LOW (ref 39.0–52.0)
HEMOGLOBIN: 11.2 g/dL — AB (ref 13.0–17.0)
IMMATURE GRANULOCYTES: 0 %
LYMPHS PCT: 19 %
Lymphs Abs: 0.9 10*3/uL (ref 0.7–4.0)
MCH: 29.6 pg (ref 26.0–34.0)
MCHC: 32.2 g/dL (ref 30.0–36.0)
MCV: 92.1 fL (ref 80.0–100.0)
Monocytes Absolute: 0.6 10*3/uL (ref 0.1–1.0)
Monocytes Relative: 13 %
NEUTROS PCT: 56 %
NRBC: 0 % (ref 0.0–0.2)
Neutro Abs: 2.6 10*3/uL (ref 1.7–7.7)
PLATELETS: 115 10*3/uL — AB (ref 150–400)
RBC: 3.78 MIL/uL — AB (ref 4.22–5.81)
RDW: 18.6 % — ABNORMAL HIGH (ref 11.5–15.5)
WBC: 4.5 10*3/uL (ref 4.0–10.5)

## 2018-08-12 LAB — COMPREHENSIVE METABOLIC PANEL
ALT: 15 U/L (ref 0–44)
ANION GAP: 12 (ref 5–15)
AST: 19 U/L (ref 15–41)
Albumin: 3.6 g/dL (ref 3.5–5.0)
Alkaline Phosphatase: 62 U/L (ref 38–126)
BILIRUBIN TOTAL: 0.7 mg/dL (ref 0.3–1.2)
BUN: 70 mg/dL — AB (ref 6–20)
CHLORIDE: 99 mmol/L (ref 98–111)
CO2: 24 mmol/L (ref 22–32)
Calcium: 8.2 mg/dL — ABNORMAL LOW (ref 8.9–10.3)
Creatinine, Ser: 13.17 mg/dL — ABNORMAL HIGH (ref 0.61–1.24)
GFR calc Af Amer: 5 mL/min — ABNORMAL LOW (ref >60)
GFR, EST NON AFRICAN AMERICAN: 4 mL/min — AB (ref >60)
Glucose, Bld: 103 mg/dL — ABNORMAL HIGH (ref 70–99)
POTASSIUM: 6.6 mmol/L — AB (ref 3.5–5.1)
Sodium: 135 mmol/L (ref 135–145)
TOTAL PROTEIN: 8.1 g/dL (ref 6.5–8.1)

## 2018-08-12 NOTE — ED Triage Notes (Signed)
Pt does dialysis MWF and missed treatment today, has been doing dialysis for 21 years. Has numbing of feet, dizziness, headache which is normal for him missing dialysis. Hx of htn and is compliant with medications.

## 2018-08-13 ENCOUNTER — Other Ambulatory Visit: Payer: Self-pay

## 2018-08-13 DIAGNOSIS — E160 Drug-induced hypoglycemia without coma: Secondary | ICD-10-CM | POA: Diagnosis not present

## 2018-08-13 DIAGNOSIS — N186 End stage renal disease: Secondary | ICD-10-CM

## 2018-08-13 DIAGNOSIS — E2740 Unspecified adrenocortical insufficiency: Secondary | ICD-10-CM | POA: Diagnosis not present

## 2018-08-13 DIAGNOSIS — Z992 Dependence on renal dialysis: Secondary | ICD-10-CM

## 2018-08-13 DIAGNOSIS — I120 Hypertensive chronic kidney disease with stage 5 chronic kidney disease or end stage renal disease: Secondary | ICD-10-CM | POA: Diagnosis not present

## 2018-08-13 DIAGNOSIS — T383X5A Adverse effect of insulin and oral hypoglycemic [antidiabetic] drugs, initial encounter: Secondary | ICD-10-CM

## 2018-08-13 DIAGNOSIS — D696 Thrombocytopenia, unspecified: Secondary | ICD-10-CM | POA: Diagnosis not present

## 2018-08-13 DIAGNOSIS — E875 Hyperkalemia: Secondary | ICD-10-CM | POA: Diagnosis not present

## 2018-08-13 DIAGNOSIS — I10 Essential (primary) hypertension: Secondary | ICD-10-CM

## 2018-08-13 LAB — CBC WITH DIFFERENTIAL/PLATELET
Abs Immature Granulocytes: 0.01 10*3/uL (ref 0.00–0.07)
Basophils Absolute: 0 10*3/uL (ref 0.0–0.1)
Basophils Relative: 1 %
Eosinophils Absolute: 0.5 10*3/uL (ref 0.0–0.5)
Eosinophils Relative: 11 %
HCT: 32.1 % — ABNORMAL LOW (ref 39.0–52.0)
Hemoglobin: 10.3 g/dL — ABNORMAL LOW (ref 13.0–17.0)
Immature Granulocytes: 0 %
Lymphocytes Relative: 27 %
Lymphs Abs: 1.2 10*3/uL (ref 0.7–4.0)
MCH: 29.2 pg (ref 26.0–34.0)
MCHC: 32.1 g/dL (ref 30.0–36.0)
MCV: 90.9 fL (ref 80.0–100.0)
Monocytes Absolute: 0.6 10*3/uL (ref 0.1–1.0)
Monocytes Relative: 15 %
Neutro Abs: 2 10*3/uL (ref 1.7–7.7)
Neutrophils Relative %: 46 %
Platelets: 131 10*3/uL — ABNORMAL LOW (ref 150–400)
RBC: 3.53 MIL/uL — ABNORMAL LOW (ref 4.22–5.81)
RDW: 18.7 % — ABNORMAL HIGH (ref 11.5–15.5)
WBC: 4.2 10*3/uL (ref 4.0–10.5)
nRBC: 0 % (ref 0.0–0.2)

## 2018-08-13 LAB — RENAL FUNCTION PANEL
ALBUMIN: 3.4 g/dL — AB (ref 3.5–5.0)
Anion gap: 14 (ref 5–15)
BUN: 75 mg/dL — AB (ref 6–20)
CO2: 22 mmol/L (ref 22–32)
Calcium: 8.3 mg/dL — ABNORMAL LOW (ref 8.9–10.3)
Chloride: 101 mmol/L (ref 98–111)
Creatinine, Ser: 13.61 mg/dL — ABNORMAL HIGH (ref 0.61–1.24)
GFR calc Af Amer: 4 mL/min — ABNORMAL LOW (ref >60)
GFR, EST NON AFRICAN AMERICAN: 4 mL/min — AB (ref >60)
Glucose, Bld: 39 mg/dL — CL (ref 70–99)
PHOSPHORUS: 5.7 mg/dL — AB (ref 2.5–4.6)
Potassium: 5.1 mmol/L (ref 3.5–5.1)
Sodium: 137 mmol/L (ref 135–145)

## 2018-08-13 LAB — CBG MONITORING, ED
Glucose-Capillary: 33 mg/dL — CL (ref 70–99)
Glucose-Capillary: 166 mg/dL — ABNORMAL HIGH (ref 70–99)

## 2018-08-13 LAB — GLUCOSE, CAPILLARY: Glucose-Capillary: 95 mg/dL (ref 70–99)

## 2018-08-13 MED ORDER — ACETAMINOPHEN 325 MG PO TABS: 650.0000 mg | ORAL_TABLET | Freq: Four times a day (QID) | ORAL | Status: DC | PRN

## 2018-08-13 MED ORDER — HYDRALAZINE HCL 50 MG PO TABS
100.0000 mg | ORAL_TABLET | Freq: Three times a day (TID) | ORAL | Status: DC
  Filled 2018-08-13: qty 2

## 2018-08-13 MED ORDER — HYDROCORTISONE 5 MG PO TABS
5.0000 mg | ORAL_TABLET | Freq: Every day | ORAL | Status: DC
  Filled 2018-08-13: qty 1

## 2018-08-13 MED ORDER — ATORVASTATIN CALCIUM 40 MG PO TABS
40.0000 mg | ORAL_TABLET | Freq: Every day | ORAL | Status: DC
  Filled 2018-08-13: qty 1

## 2018-08-13 MED ORDER — ALBUTEROL SULFATE (2.5 MG/3ML) 0.083% IN NEBU: 2.5000 mg | INHALATION_SOLUTION | Freq: Four times a day (QID) | RESPIRATORY_TRACT | Status: DC | PRN

## 2018-08-13 MED ORDER — SODIUM CHLORIDE 0.9 % IV SOLN
1.0000 g | Freq: Once | INTRAVENOUS | Status: AC
  Administered 2018-08-13: 1 g via INTRAVENOUS
  Filled 2018-08-13: qty 10

## 2018-08-13 MED ORDER — LOSARTAN POTASSIUM 50 MG PO TABS
100.0000 mg | ORAL_TABLET | Freq: Every day | ORAL | Status: DC
  Filled 2018-08-13: qty 2

## 2018-08-13 MED ORDER — RENA-VITE PO TABS
1.0000 | ORAL_TABLET | Freq: Every day | ORAL | Status: DC
  Filled 2018-08-13: qty 1

## 2018-08-13 MED ORDER — ONDANSETRON HCL 4 MG PO TABS: 4.0000 mg | ORAL_TABLET | Freq: Four times a day (QID) | ORAL | Status: DC | PRN

## 2018-08-13 MED ORDER — ASPIRIN EC 81 MG PO TBEC
81.0000 mg | DELAYED_RELEASE_TABLET | Freq: Every day | ORAL | Status: DC
  Filled 2018-08-13: qty 1

## 2018-08-13 MED ORDER — HEPARIN SODIUM (PORCINE) 5000 UNIT/ML IJ SOLN: 5000.0000 [IU] | Freq: Three times a day (TID) | INTRAMUSCULAR | Status: DC

## 2018-08-13 MED ORDER — BUTALBITAL-APAP-CAFFEINE 50-325-40 MG PO TABS: 1.0000 | ORAL_TABLET | Freq: Four times a day (QID) | ORAL | Status: DC | PRN

## 2018-08-13 MED ORDER — HYDRALAZINE HCL 20 MG/ML IJ SOLN
10.0000 mg | INTRAMUSCULAR | Status: DC | PRN
  Administered 2018-08-13: 10 mg via INTRAVENOUS
  Filled 2018-08-13: qty 1

## 2018-08-13 MED ORDER — CARVEDILOL 25 MG PO TABS
25.0000 mg | ORAL_TABLET | Freq: Two times a day (BID) | ORAL | Status: DC
  Filled 2018-08-13: qty 1

## 2018-08-13 MED ORDER — DEXTROSE 50 % IV SOLN
1.0000 | Freq: Once | INTRAVENOUS | Status: AC
  Administered 2018-08-13: 50 mL via INTRAVENOUS
  Filled 2018-08-13: qty 50

## 2018-08-13 MED ORDER — ACETAMINOPHEN 500 MG PO TABS
1000.0000 mg | ORAL_TABLET | Freq: Once | ORAL | Status: DC
  Filled 2018-08-13: qty 2

## 2018-08-13 MED ORDER — SODIUM CHLORIDE 0.9% FLUSH
3.0000 mL | Freq: Two times a day (BID) | INTRAVENOUS | Status: DC
  Administered 2018-08-13: 3 mL via INTRAVENOUS

## 2018-08-13 MED ORDER — ACETAMINOPHEN 650 MG RE SUPP: 650.0000 mg | Freq: Four times a day (QID) | RECTAL | Status: DC | PRN

## 2018-08-13 MED ORDER — ONDANSETRON HCL 4 MG/2ML IJ SOLN: 4.0000 mg | Freq: Four times a day (QID) | INTRAMUSCULAR | Status: DC | PRN

## 2018-08-13 MED ORDER — PANTOPRAZOLE SODIUM 40 MG PO TBEC
40.0000 mg | DELAYED_RELEASE_TABLET | Freq: Every day | ORAL | Status: DC
  Filled 2018-08-13: qty 1

## 2018-08-13 MED ORDER — HYDROMORPHONE HCL 1 MG/ML IJ SOLN
0.5000 mg | INTRAMUSCULAR | Status: AC
  Administered 2018-08-13: 0.5 mg via INTRAVENOUS
  Filled 2018-08-13: qty 1

## 2018-08-13 MED ORDER — INSULIN ASPART 100 UNIT/ML ~~LOC~~ SOLN
5.0000 [IU] | Freq: Once | SUBCUTANEOUS | Status: AC
  Administered 2018-08-13: 5 [IU] via INTRAVENOUS
  Filled 2018-08-13: qty 1

## 2018-08-13 NOTE — ED Notes (Signed)
Pt alerted this EMT that his blood sugar felt low, this EMT checked his blood sugar to be 33. RN notified. Pt given orange juice and a [place] sandwich with apple sauce.

## 2018-08-13 NOTE — Progress Notes (Signed)
Brief progress note: 47 year old man with history of ESRD on hemodialysis Monday Wednesday Friday, hypertension, renal insufficiency, anemia, presenting to the emergency department with generalized malaise, headache since this morning, intermittent nausea, blurred vision and photophobia.  He missed his hemodialysis on 08/13/19 17 to the ED with above symptoms "my problems are because I missed dialysis ".  The ED, he was noted to have a potassium of 6.6, creatinine 13, BUN 70, receiving IV calcium, IV insulin, IV glucose for high potassium, and with good response, but reduction potassium level at 5.1.  Nephrology evaluated the patient, and was to perform hemodialysis today, but the patient left AMA, stating that he did not want to wait until noon for dialysis to take place, "disappointed that dialysis would not be early in the morning ".  He refused to sign the AMA form.  Telemetry was removed by patient, CMP notified, and he left ambulating, with steady gait.  HD has been notified. In addition to the above problems, the patient was noted to have a brief episode of hypoglycemia, in view of the insulin received at the ED, quickly resolved with an amp of D50, and food Exam show lungs clear, right chest wall dialysis catheter without signs of infection. Cardiovascular regular rate and rhythm. Abdomen soft, nontender, bowel sounds x4. Neuro the patient has been able to answer questions, and following commands appropriately.  Strength normal.  No focal neuro deficits or facial asymmetry.

## 2018-08-13 NOTE — Progress Notes (Signed)
  Pt admitted to the unit. Pt is stable, alert and oriented per baseline. Oriented to room, staff, and call bell. Educated to call for any assistance. Bed in lowest position, call bell within reach- will continue to monitor. 

## 2018-08-13 NOTE — Progress Notes (Signed)
Patient refuses being swabbed for the flu

## 2018-08-13 NOTE — Progress Notes (Signed)
Received report

## 2018-08-13 NOTE — Progress Notes (Signed)
Patient's temp was difficult to get even with several tired. Oral finally read 93.2. Patient was asked to do a rectal temp to make sure but he refused, he is warm and dry to touch. NP paged.

## 2018-08-13 NOTE — ED Provider Notes (Signed)
[place] EMERGENCY DEPARTMENT Provider Note   CSN: [id number] Arrival date & time: 08/12/18  2152     History   Chief Complaint Chief Complaint  Patient presents with  . Missed dialysis    HPI Souleymane Saiki is a 47 y.o. male.  The history is provided by the patient and medical records.     47 year old male with history of end-stage renal disease on hemodialysis, GERD, hyperlipidemia, hypertension, adrenal insufficiency, presenting to the ED complaints of "feeling bad".  States he did not feel well yesterday (Wednesday) so he did not go to dialysis.  States later in the evening he started feeling bad, states he suspected his numbers were off so he decided to come in.  States he feels somewhat achy, weak, with a headache.  He denies any chest pain or shortness of breath.  He has not had any dizziness or syncope.  States he has not missed any other dialysis sessions and is usually very compliant.  He does not follow with one specific nephrologist, sees whoever is in the clinic that day.  Past Medical History:  Diagnosis Date  . Adrenal insufficiency ([place])   . Dyspnea    "when I have too much fluid"  . ESRD on dialysis [facility]) since 1990s   "M,W,F; [address]" (11/27/2016)  . GERD (gastroesophageal reflux disease)   . High cholesterol   . History of blood transfusion   . Hypertension   . Kidney failure   . Renal insufficiency   . Stroke [facility]) 2016   decreased vision in his left eye/notes 11/27/2016    Patient Active Problem List   Diagnosis Date Noted  . ESRD (end stage renal disease) ([place]) 01/14/2018  . Hyperkalemia, diminished renal excretion 10/20/2017  . Post-operative complication 17/00/1749  . 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[place])   . ESRD on hemodialysis ([place])   . 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 Adrenal insufficiency ([place])   . Hypertension 11/27/2016  . History of CVA (cerebrovascular accident) 11/27/2016  . Monoclonal gammopathy of  unknown significance (MGUS) 11/27/2016    Past Surgical History:  Procedure Laterality Date  . ANGIOPLASTY Left 12/11/2016   Procedure: ANGIOPLASTY LEFT ARM & SUBCLAVIAN ARTERY;  Surgeon: Conrad Pascoag, MD;  Location: [place];  Service: Vascular;  Laterality: Left;  . arm surgery Left 2016   "for aneurysm"  . AV FISTULA PLACEMENT    . FALSE ANEURYSM REPAIR Left 12/11/2016   Procedure: RESECTION  BRACHIAL ARTERY;  Surgeon: Conrad Jessamine, MD;  Location: [place];  Service: Vascular;  Laterality: Left;  . FALSE ANEURYSM REPAIR Left 01/14/2018   Procedure: REPAIR FALSE ANEURYSM LEFT BRACHIAL ARTERY WITH SAPHENOUS VEIN  LEFT ARTERIOVENOUS GRAFT;  Surgeon: Rosetta Posner, MD;  Location: [place];  Service: Vascular;  Laterality: Left;  . INSERTION OF DIALYSIS CATHETER Right    chest  . THROMBECTOMY BRACHIAL ARTERY  12/11/2016   Procedure: THROMBECTOMY BRACHIAL ARTERY AND ULNAR;  Surgeon: Conrad Grayling, MD;  Location: [place];  Service: Vascular;;  . THYROIDECTOMY, PARTIAL    . UPPER EXTREMITY ANGIOGRAM Left 12/11/2016   Procedure: LEFT ARM ANGIOGRAM;  Surgeon: Conrad , MD;  Location: [place];  Service: Vascular;  Laterality: Left;  Marland Kitchen VEIN HARVEST Left 01/14/2018   Procedure: VEIN HARVEST LEFT SAPHENOUS VEIN;  Surgeon: Rosetta Posner, MD;  Location: [place];  Service: Vascular;  Laterality: Left;  . WOUND EXPLORATION Left 12/11/2016   Procedure: LEFT ARM BRACHIAL ARTERY WITH Loistine Chance  GRAFT;  Surgeon: Conrad Utopia, MD;  Location: [place];  Service: Vascular;  Laterality: Left;        Home Medications    Prior to Admission medications   Medication Sig Start Date End Date Taking? Authorizing Provider  aspirin EC 81 MG tablet Take 81 mg by mouth daily.   Yes [provider]  atorvastatin (LIPITOR) 40 MG tablet Take 40 mg by mouth daily.   Yes [provider]  carvedilol (COREG) 25 MG tablet Take 25 mg by mouth 2 (two) times daily with a meal.   Yes [provider]  folic acid-vitamin b  complex-vitamin c-selenium-zinc (DIALYVITE) 3 MG TABS tablet Take 1 tablet by mouth daily.   Yes [provider]  hydrALAZINE (APRESOLINE) 100 MG tablet Take 100 mg by mouth 3 (three) times daily.   Yes [provider]  hydrocortisone (CORTEF) 5 MG tablet TAKE 1 TABLET BY MOUTH TWICE A DAY Patient taking differently: Take 5 mg by mouth daily.  10/09/17  Yes Charlott Rakes, MD  losartan (COZAAR) 100 MG tablet Take 100 mg by mouth daily.   Yes [provider]  omeprazole (PRILOSEC) 40 MG capsule Take 40 mg by mouth daily.   Yes [provider]  NONFORMULARY OR COMPOUNDED [place]:  Onychomycosis Nail Lacquer - Fluconazole 2%, Terbinafine 1%, DMSO apply to affected areas daily. Patient not taking: Reported on 08/13/2018 08/03/18   Trula Slade, DPM    Family History Family History  Problem Relation Age of Onset  . Hypertension Other   . Adrenal disorder Neg Hx     Social History Social History   Tobacco Use  . Smoking status: Current Every Day Smoker    Packs/day: 0.50    Years: 20.00    Pack years: 10.00    Types: Cigarettes  . Smokeless tobacco: Never Used  . Tobacco comment: 1 pk every 2 days.  Substance Use Topics  . Alcohol use: Yes    Comment: occ  . Drug use: No     Allergies   Doxycycline; Codeine; Gabapentin; Lisinopril; Povidone-iodine; and Betadine [povidone iodine]   Review of Systems Review of Systems  Constitutional:       ESRD  All other systems reviewed and are negative.    Physical Exam Updated Vital Signs BP (!) 157/95   Pulse 68   Temp 97.7 F (36.5 C) (Oral)   Resp 10   Ht 5\' 8"  (1.727 m)   Wt 63.5 kg   SpO2 98%   BMI 21.29 kg/m   Physical Exam  Constitutional: He is oriented to person, place, and time. He appears well-developed and well-nourished.  HENT:  Head: Normocephalic and atraumatic.  Mouth/Throat: Oropharynx is clear and moist.  Eyes: Pupils are equal, round, and reactive  to light. Conjunctivae and EOM are normal.  Neck: Normal range of motion. No neck rigidity.  Full ROM, no rigidity  Cardiovascular: Normal rate, regular rhythm and normal heart sounds.  Pulmonary/Chest: Effort normal and breath sounds normal. No stridor. No respiratory distress. He has no wheezes.  Dialysis catheter right chest wall, no signs of infection  Abdominal: Soft. Bowel sounds are normal.  Musculoskeletal: Normal range of motion.  Neurological: He is alert and oriented to person, place, and time.  AAOx3, answering questions and following commands appropriately; equal strength UE and LE bilaterally; CN grossly intact; moves all extremities appropriately without ataxia; no focal neuro deficits or facial asymmetry appreciated  Skin: Skin is warm and  dry.  Psychiatric: He has a normal mood and affect.  Nursing note and vitals reviewed.    ED Treatments / Results  Labs (all labs ordered are listed, but only abnormal results are displayed) Labs Reviewed  COMPREHENSIVE METABOLIC PANEL - Abnormal; Notable for the following components:      Result Value   Potassium 6.6 (*)    Glucose, Bld 103 (*)    BUN 70 (*)    Creatinine, Ser 13.17 (*)    Calcium 8.2 (*)    GFR calc non Af Amer 4 (*)    GFR calc Af Amer 5 (*)    All other components within normal limits  CBC WITH DIFFERENTIAL/PLATELET - Abnormal; Notable for the following components:   RBC 3.78 (*)    Hemoglobin 11.2 (*)    HCT 34.8 (*)    RDW 18.6 (*)    Platelets 115 (*)    All other components within normal limits    EKG None  Radiology No results found.  Procedures Procedures (including critical care time)`  CRITICAL CARE Performed by: Larene Pickett   Total critical care time: 35 minutes  Critical care time was exclusive of separately billable procedures and treating other patients.  Critical care was necessary to treat or prevent imminent or life-threatening deterioration.  Critical care was time  spent personally by me on the following activities: development of treatment plan with patient and/or surrogate as well as nursing, discussions with consultants, evaluation of patient's response to treatment, examination of patient, obtaining history from patient or surrogate, ordering and performing treatments and interventions, ordering and review of laboratory studies, ordering and review of radiographic studies, pulse oximetry and re-evaluation of patient's condition.   Medications Ordered in ED Medications  insulin aspart (novoLOG) injection 5 Units (has no administration in time range)  acetaminophen (TYLENOL) tablet 1,000 mg (has no administration in time range)  calcium gluconate 1 g in sodium chloride 0.9 % 100 mL IVPB (0 g Intravenous Stopped 08/13/18 0230)  dextrose 50 % solution 50 mL (50 mLs Intravenous Given 08/13/18 0232)     Initial Impression / Assessment and Plan / ED Course  I have reviewed the triage vital signs and the nursing notes.  Pertinent labs & imaging results that were available during my care of the patient were reviewed by me and considered in my medical decision making (see chart for details).  47 year old male here with complaint of feeling better after missing dialysis session on 08/12/2018.  He is awake, alert, peripherally oriented.  Denies any chest pain or shortness of breath.  Vitals are stable.  His only physical complaint is headache and feeling poorly.  Neurologic exam is nonfocal.  Dialysis catheter in right chest wall appears clean without any signs of infection.  Labs with a potassium of 6.6.  EKG without any peak T waves.  Discussed with nephrology, Dr. Shanon Rosser-- recommends treatment for hyperkalemia here in the ED, admission, and will dialyze in the morning.  Patient given calcium gluconate, insulin, and D50.  Discussed with hospitalist, Dr. [name] who will admit for ongoing care.  Final Clinical Impressions(s) / ED Diagnoses   Final diagnoses:   Hyperkalemia  ESRD on hemodialysis [facility])    ED Discharge Orders    None       Larene Pickett, PA-C 08/13/18 [id number]    Orpah Greek, MD 08/13/18 [phone]

## 2018-08-13 NOTE — H&P (Addendum)
History and Physical    Ron Beske WYO:[id number] DOB: 1971/03/02 DOA: 08/12/2018  Referring MD/NP/PA: Quincy Carnes, PA-C PCP: Charlott Rakes, MD  Patient coming from: home  Chief Complaint: Not feeling well  I have personally briefly reviewed patient's old medical records in [place]   HPI: Marvin Roberson is a 47 y.o. male with medical history significant of ESRD on HD(M/W/F), HTN, HLD, adrenal insufficiency, and anemia; who presents with complaints of not feeling well.  He complains of having severe migraine with pain behind both eyes and photophobia.   Associated symptoms included nausea, numbness in his feet, blurred vision, mild shortness of breath, and recent sick contacts with a cousin that may have the flu.  Due to the symptoms he had missed his regularly scheduled dialysis session.  He came to the hospital as he thought it and his symptoms may be related to his missed dialysis session. Symptoms are similar to previous migraines.  Denies having any fever, chest pain, vomiting, abdominal pain, or diarrhea.   ED Course: Upon admission to the emergency department patient was noted to be afebrile, pulse 63-72, blood pressure elevated of ten 190/88, O2 saturation 96 to 100% on room air.  Labs revealed WBC 4.5, hemoglobin 10.2, platelets 115, potassium 6.6, BUN 70, creatinine 13.17.  Patient was given 1 g of calcium gluconate, 50 g of dextrose, and 5 units of insulin in the emergency department.  Dr. Shanon Rosser of nephrology was consulted and they will dialyze patient in a.m.  Review of Systems  Constitutional: Positive for malaise/fatigue. Negative for chills and fever.  HENT: Negative for congestion and nosebleeds.   Eyes: Positive for blurred vision and photophobia.  Respiratory: Positive for shortness of breath. Negative for cough.   Cardiovascular: Positive for leg swelling (Mild). Negative for chest pain.  Gastrointestinal: Positive for nausea. Negative for abdominal pain, diarrhea  and vomiting.  Genitourinary: Negative for dysuria and hematuria.  Musculoskeletal: Negative for joint pain and myalgias.  Skin: Negative for itching and rash.  Neurological: Positive for headaches. Negative for loss of consciousness.  Psychiatric/Behavioral: Negative for substance abuse.    Past Medical History:  Diagnosis Date  . Adrenal insufficiency ([place])   . Dyspnea    "when I have too much fluid"  . ESRD on dialysis [facility]) since 1990s   "M,W,F; [address]" (11/27/2016)  . GERD (gastroesophageal reflux disease)   . High cholesterol   . History of blood transfusion   . Hypertension   . Kidney failure   . Renal insufficiency   . Stroke [facility]) 2016   decreased vision in his left eye/notes 11/27/2016    Past Surgical History:  Procedure Laterality Date  . ANGIOPLASTY Left 12/11/2016   Procedure: ANGIOPLASTY LEFT ARM & SUBCLAVIAN ARTERY;  Surgeon: Conrad Brentwood, MD;  Location: [place];  Service: Vascular;  Laterality: Left;  . arm surgery Left 2016   "for aneurysm"  . AV FISTULA PLACEMENT    . FALSE ANEURYSM REPAIR Left 12/11/2016   Procedure: RESECTION  BRACHIAL ARTERY;  Surgeon: Conrad Cowiche, MD;  Location: [place];  Service: Vascular;  Laterality: Left;  . FALSE ANEURYSM REPAIR Left 01/14/2018   Procedure: REPAIR FALSE ANEURYSM LEFT BRACHIAL ARTERY WITH SAPHENOUS VEIN  LEFT ARTERIOVENOUS GRAFT;  Surgeon: Rosetta Posner, MD;  Location: [place];  Service: Vascular;  Laterality: Left;  . INSERTION OF DIALYSIS CATHETER Right    chest  . THROMBECTOMY BRACHIAL ARTERY  12/11/2016   Procedure: THROMBECTOMY BRACHIAL ARTERY AND ULNAR;  Surgeon: Conrad Pasadena, MD;  Location: [place];  Service: Vascular;;  . THYROIDECTOMY, PARTIAL    . UPPER EXTREMITY ANGIOGRAM Left 12/11/2016   Procedure: LEFT ARM ANGIOGRAM;  Surgeon: Conrad Lockney, MD;  Location: [place];  Service: Vascular;  Laterality: Left;  Marland Kitchen VEIN HARVEST Left 01/14/2018   Procedure: VEIN HARVEST LEFT SAPHENOUS VEIN;  Surgeon: Rosetta Posner, MD;   Location: [place];  Service: Vascular;  Laterality: Left;  . WOUND EXPLORATION Left 12/11/2016   Procedure: LEFT ARM BRACHIAL ARTERY WITH INTERPOSTIONAL GRAFT;  Surgeon: Conrad Appling, MD;  Location: [place];  Service: Vascular;  Laterality: Left;     reports that he has been smoking cigarettes. He has a 10.00 pack-year smoking history. He has never used smokeless tobacco. He reports that he drinks alcohol. He reports that he does not use drugs.  Allergies  Allergen Reactions  . Doxycycline Shortness Of Breath  . Codeine Nausea And Vomiting  . Gabapentin Other (See Comments)    Sleepiness, altered mental state  . Lisinopril Other (See Comments) and Swelling    angioedema  . Povidone-Iodine Itching  . Betadine [Povidone Iodine] Rash    Family History  Problem Relation Age of Onset  . Hypertension Other   . Adrenal disorder Neg Hx     Prior to Admission medications   Medication Sig Start Date End Date Taking? Authorizing Provider  aspirin EC 81 MG tablet Take 81 mg by mouth daily.   Yes [provider]  atorvastatin (LIPITOR) 40 MG tablet Take 40 mg by mouth daily.   Yes [provider]  carvedilol (COREG) 25 MG tablet Take 25 mg by mouth 2 (two) times daily with a meal.   Yes [provider]  folic acid-vitamin b complex-vitamin c-selenium-zinc (DIALYVITE) 3 MG TABS tablet Take 1 tablet by mouth daily.   Yes [provider]  hydrALAZINE (APRESOLINE) 100 MG tablet Take 100 mg by mouth 3 (three) times daily.   Yes [provider]  hydrocortisone (CORTEF) 5 MG tablet TAKE 1 TABLET BY MOUTH TWICE A DAY Patient taking differently: Take 5 mg by mouth daily.  10/09/17  Yes Charlott Rakes, MD  losartan (COZAAR) 100 MG tablet Take 100 mg by mouth daily.   Yes [provider]  omeprazole (PRILOSEC) 40 MG capsule Take 40 mg by mouth daily.   Yes [provider]  NONFORMULARY OR COMPOUNDED [place]:  Onychomycosis Nail  Lacquer - Fluconazole 2%, Terbinafine 1%, DMSO apply to affected areas daily. Patient not taking: Reported on 08/13/2018 08/03/18   Trula Slade, DPM    Physical Exam:  Constitutional: Middle-aged male who appears to be in some discomfort but able to follow commands Vitals:   08/13/18 0100 08/13/18 0115 08/13/18 0145 08/13/18 0230  BP: (!) 160/85 (!) 174/92 (!) 163/91 (!) 168/95  Pulse: 65 66 66 64  Resp: 13 16 16  (!) 21  Temp:      TempSrc:      SpO2: 98% 98% 97% 97%  Weight:      Height:       Eyes: PERRL, lids and conjunctivae normal ENMT: Mucous membranes are moist. Posterior pharynx clear of any exudate or lesions. Normal dentition.  Neck: normal, supple, no masses, no thyromegaly Respiratory: clear to auscultation bilaterally, no wheezing, no crackles. Normal respiratory effort. No accessory muscle use.  Cardiovascular: Regular rate and rhythm, no murmurs / rubs / gallops. No extremity edema. 2+ pedal pulses. No carotid bruits.  Left upper extremity fistula present. Abdomen: no tenderness, no masses palpated. No hepatosplenomegaly. Bowel sounds positive.  Musculoskeletal: no clubbing / cyanosis. No joint deformity upper and lower extremities. Good ROM, no contractures. Normal muscle tone.  Skin: no rashes, lesions, ulcers. No induration Neurologic: CN 2-12 grossly intact. Sensation intact, DTR normal. Strength 5/5 in all 4.  Psychiatric: Normal judgment and insight. Alert and oriented x 3. Normal mood.     Labs on Admission: I have personally reviewed following labs and imaging studies  CBC: Recent Labs  Lab 08/12/18 2247  WBC 4.5  NEUTROABS 2.6  HGB 11.2*  HCT 34.8*  MCV 92.1  PLT 712*   Basic Metabolic Panel: Recent Labs  Lab 08/12/18 2247  NA 135  K 6.6*  CL 99  CO2 24  GLUCOSE 103*  BUN 70*  CREATININE 13.17*  CALCIUM 8.2*   GFR: Estimated Creatinine Clearance: 6.3 mL/min (A) (by C-G formula based on SCr of 13.17 mg/dL (H)). Liver Function  Tests: Recent Labs  Lab 08/12/18 2247  AST 19  ALT 15  ALKPHOS 62  BILITOT 0.7  PROT 8.1  ALBUMIN 3.6   No results for input(s): LIPASE, AMYLASE in the last 168 hours. No results for input(s): AMMONIA in the last 168 hours. Coagulation Profile: No results for input(s): INR, PROTIME in the last 168 hours. Cardiac Enzymes: No results for input(s): CKTOTAL, CKMB, CKMBINDEX, TROPONINI in the last 168 hours. BNP (last 3 results) No results for input(s): PROBNP in the last 8760 hours. HbA1C: No results for input(s): HGBA1C in the last 72 hours. CBG: No results for input(s): GLUCAP in the last 168 hours. Lipid Profile: No results for input(s): CHOL, HDL, LDLCALC, TRIG, CHOLHDL, LDLDIRECT in the last 72 hours. Thyroid Function Tests: No results for input(s): TSH, T4TOTAL, FREET4, T3FREE, THYROIDAB in the last 72 hours. Anemia Panel: No results for input(s): VITAMINB12, FOLATE, FERRITIN, TIBC, IRON, RETICCTPCT in the last 72 hours. Urine analysis:    Component Value Date/Time   COLORURINE STRAW (A) 05/24/2017 2321   APPEARANCEUR CLEAR 05/24/2017 2321   LABSPEC 1.005 05/24/2017 2321   PHURINE 8.0 05/24/2017 2321   GLUCOSEU 50 (A) 05/24/2017 2321   HGBUR NEGATIVE 05/24/2017 2321   BILIRUBINUR NEGATIVE 05/24/2017 2321   KETONESUR NEGATIVE 05/24/2017 2321   PROTEINUR 30 (A) 05/24/2017 2321   NITRITE NEGATIVE 05/24/2017 2321   LEUKOCYTESUR NEGATIVE 05/24/2017 2321   Sepsis Labs: No results found for this or any previous visit (from the past 240 hour(s)).   Radiological Exams on Admission: No results found.  EKG: Independently reviewed.  Sinus rhythm at 67 bpm with QTc 484  Assessment/Plan Hyperkalemia: Acute.  Patient presents with elevated potassium of 6.6 on admission.  Patient had been treated with calcium gluconate, dextrose, and insulin.  Nephrology had been consulted and will get the patient dialyzed in a.m. - Admit to telemetry bed - Per nephrology - Check renal  function panel in a.m.  Addendum Hypoglycemia due to insulin: Acute.  Patient became diaphoretic and blood glucose was noted to be 33.  Suspect secondary to insulin and dextrose given for hyperkalemia. - Hypoglycemic protocols - Give amp of D50 - Continue to monitor and treat as needed  ESRD on HD: Patient normally dialyzes M-W-F, but missed dialysis due to symptoms.  Patient noted to have elevated potassium of 6.6, BUN 70, creatinine 13.17. - Per nephrology  Migraine headaches: Patient reports having migraine-like headache with pain behind bilateral eyes.  He also reports possible recent sick contacts  with cousin that may have had the flu. - Fioricet prn pain/headache  - Check flu screen - May warrant imaging of brain, if symptoms persist  Essential hypertension, controlled: Blood pressures elevated up to 190/88 on admission.  Symptoms could be related with pain from reports of migraine. - Continue Coreg, hydralazine, losartan - Hydralazine IV prn elevated blood pressures  Adrenal insufficiency - Continue hydrocortisone  Anemia of chronic kidney disease: Hemoglobin 11.2 on admission. - Continue to monitor   Thrombocytopenia: Chronic.  Initial platelet count 115 admission.  Patient  denies any reports of bleeding. - Continue to monitor  DVT prophylaxis: Heparin Code Status: Full Family Communication: Discussed plan of care with patient friends present at bedside Disposition Plan: Likely discharge home after dialysis Consults called: Neurology Admission status: Observation  Norval Morton MD [facility] Pager [phone]   If 7PM-7AM, please contact night-coverage [email or web address] Password [email or web address]  08/13/2018, 3:23 AM

## 2018-08-13 NOTE — Consult Note (Signed)
Renal Service Consult Note [facility]  Marvin Roberson 08/13/2018 Marvin Roberson Requesting Physician:  Dr Marvin Roberson  Reason for Consult:  ESRD pt w/  HPI: The patient is a 47 y.o. year-old w hx of ESRD, HTN , adrenal insuff and anemia presented to ED w/ general malaise and headaches early this am. +recent sick cousin, +nausea, blurred vision and photophobia.  Missed HD yesterday, came to ED concerned that some of his problems were from missing HD.  In ED K was 6.6 creat 13, BUN 70.  He was given IV Ca, IV insulin and IV glucose for high K+, repeat K+ this am was 5.1.  We are asked to see for ESRD.    Patient states he thought we was going to HD this am and is upset and is refusing HD today.     ROS  denies CP  no joint pain   no HA  no blurry vision  no rash  no diarrhea  no nausea/ vomiting    Past Medical History  Past Medical History:  Diagnosis Date  . Adrenal insufficiency ([place])   . Dyspnea    "when I have too much fluid"  . ESRD on dialysis [facility]) since 1990s   "M,W,F; [address]" (11/27/2016)  . GERD (gastroesophageal reflux disease)   . High cholesterol   . History of blood transfusion   . Hypertension   . Kidney failure   . Renal insufficiency   . Stroke [facility]) 2016   decreased vision in his left eye/notes 11/27/2016   Past Surgical History  Past Surgical History:  Procedure Laterality Date  . ANGIOPLASTY Left 12/11/2016   Procedure: ANGIOPLASTY LEFT ARM & SUBCLAVIAN ARTERY;  Surgeon: Marvin Dunlap, MD;  Location: [place];  Service: Vascular;  Laterality: Left;  . arm surgery Left 2016   "for aneurysm"  . AV FISTULA PLACEMENT    . FALSE ANEURYSM REPAIR Left 12/11/2016   Procedure: RESECTION  BRACHIAL ARTERY;  Surgeon: Marvin [place]el [place], MD;  Location: [place];  Service: Vascular;  Laterality: Left;  . FALSE ANEURYSM REPAIR Left 01/14/2018   Procedure: REPAIR FALSE ANEURYSM LEFT BRACHIAL ARTERY WITH SAPHENOUS VEIN  LEFT ARTERIOVENOUS GRAFT;  Surgeon: Marvin Posner, MD;  Location: [place];  Service: Vascular;  Laterality: Left;  . INSERTION OF DIALYSIS CATHETER Right    chest  . THROMBECTOMY BRACHIAL ARTERY  12/11/2016   Procedure: THROMBECTOMY BRACHIAL ARTERY AND ULNAR;  Surgeon: Marvin [place], MD;  Location: [place];  Service: Vascular;;  . THYROIDECTOMY, PARTIAL    . UPPER EXTREMITY ANGIOGRAM Left 12/11/2016   Procedure: LEFT ARM ANGIOGRAM;  Surgeon: Marvin [place], MD;  Location: [place];  Service: Vascular;  Laterality: Left;  Marland Kitchen VEIN HARVEST Left 01/14/2018   Procedure: VEIN HARVEST LEFT SAPHENOUS VEIN;  Surgeon: Marvin Posner, MD;  Location: [place];  Service: Vascular;  Laterality: Left;  . WOUND EXPLORATION Left 12/11/2016   Procedure: LEFT ARM BRACHIAL ARTERY WITH INTERPOSTIONAL GRAFT;  Surgeon: Marvin Peabody, MD;  Location: [facility] OR;  Service: Vascular;  Laterality: Left;   Family History  Family History  Problem Relation Age of Onset  . Hypertension Other   . Adrenal disorder Neg Hx    Social History  reports that he has been smoking cigarettes. He has a 10.00 pack-year smoking history. He has never used smokeless tobacco. He reports that he drinks alcohol. He reports that he does not use drugs. Allergies  Allergies  Allergen Reactions  .  Doxycycline Shortness Of Breath  . Codeine Nausea And Vomiting  . Gabapentin Other (See Comments)    Sleepiness, altered mental state  . Lisinopril Other (See Comments) and Swelling    angioedema  . Povidone-Iodine Itching  . Betadine [Povidone Iodine] Rash   Home medications Prior to Admission medications   Medication Sig Start Date End Date Taking? Authorizing Provider  aspirin EC 81 MG tablet Take 81 mg by mouth daily.   Yes [provider]  atorvastatin (LIPITOR) 40 MG tablet Take 40 mg by mouth daily.   Yes [provider]  carvedilol (COREG) 25 MG tablet Take 25 mg by mouth 2 (two) times daily with a meal.   Yes [provider]  folic acid-vitamin b complex-vitamin  c-selenium-zinc (DIALYVITE) 3 MG TABS tablet Take 1 tablet by mouth daily.   Yes [provider]  hydrALAZINE (APRESOLINE) 100 MG tablet Take 100 mg by mouth 3 (three) times daily.   Yes [provider]  hydrocortisone (CORTEF) 5 MG tablet TAKE 1 TABLET BY MOUTH TWICE A DAY Patient taking differently: Take 5 mg by mouth daily.  10/09/17  Yes Marvin Rakes, MD  losartan (COZAAR) 100 MG tablet Take 100 mg by mouth daily.   Yes [provider]  omeprazole (PRILOSEC) 40 MG capsule Take 40 mg by mouth daily.   Yes [provider]  NONFORMULARY OR COMPOUNDED [place]:  Onychomycosis Nail Lacquer - Fluconazole 2%, Terbinafine 1%, DMSO apply to affected areas daily. Patient not taking: Reported on 08/13/2018 08/03/18   Marvin Roberson, Marvin Roberson   Liver Function Tests Recent Labs  Lab 08/12/18 2247 08/13/18 0350  AST 19  --   ALT 15  --   ALKPHOS 62  --   BILITOT 0.7  --   PROT 8.1  --   ALBUMIN 3.6 3.4*   No results for input(s): LIPASE, AMYLASE in the last 168 hours. CBC Recent Labs  Lab 08/12/18 2247 08/13/18 0350  WBC 4.5 4.2  NEUTROABS 2.6 2.0  HGB 11.2* 10.3*  HCT 34.8* 32.1*  MCV 92.1 90.9  PLT 115* 696*   Basic Metabolic Panel Recent Labs  Lab 08/12/18 2247 08/13/18 0350  NA 135 137  K 6.6* 5.1  CL 99 101  CO2 24 22  GLUCOSE 103* 39*  BUN 70* 75*  CREATININE 13.17* 13.61*  CALCIUM 8.2* 8.3*  PHOS  --  5.7*   Iron/TIBC/Ferritin/ %Sat No results found for: IRON, TIBC, FERRITIN, IRONPCTSAT  Vitals:   08/13/18 0515 08/13/18 0528 08/13/18 0623 08/13/18 0624  BP:  (!) 161/87    Pulse:  (!) 59 92   Resp:  16    Temp:   (!) 92.4 F (33.6 C) (!) 93.2 F (34 C)  TempSrc:   Axillary Oral  SpO2:  100%    Weight: 61.3 kg     Height: 5\' 8"  (1.727 m)      Exam Gen alert, thin AAM no distress No rash, cyanosis or gangrene Sclera anicteric, throat clear  No jvd or bruits Chest clear bilat to bases RRR soft sem no  RG Abd soft ntnd no mass or ascites +bs GU normal male MS no joint effusions or deformity Ext no LE edema, no wounds or ulcers Neuro is alert, Ox 3 , nf LUA AVF +bruit    Home meds:  - aspirin 81/ atorvastatin 40/ omeprazole 40  - hydrocortisone 5 mg qd  - carvedilol 25 bid/ hydralazine 100 tid/ losartan 100 qd  -  MVI   Dialysis: [place] MWF 3.5h  350/800  TDC  2/2.5 bath  Hep 2000 + 2041midrun - hect 2 ug  - venofer 100 /hd needs 2 more to complete 5 doses    Impression: 1. Flu-like illness - nausea, HA, fevers subj, malaise 2. Hyperkalemia - treated/ improved w/ IV insulin /Ca/ gluocose.  3. ESRD - usual HD MWF, missed yest (Wed), refusing HD today, says he will go to his HD tomorrow, says he is thinking about leaving today.  4. HTN - cont meds x 3 5. Adrenal insuff - per primary   Plan - as above  Kelly Splinter MD [place] pager [phone]   08/13/2018, 11:31 AM

## 2018-08-13 NOTE — Progress Notes (Signed)
Pt leaving AMA. Pt refusing to wait to have dialysis done at 12noon, states he was told it would be first thing this morning. Renal MD Dr Melvia Heaps rounded earlier this morning.  Dr Eliseo Squires was notified that pt was leaving AMA. Pt refused to sign AMA form.   Telemetry removed by pt, CMT notified, RN removed IV. Pt left ambulating, steady gait. Will notify HD department.

## 2018-08-14 DIAGNOSIS — E875 Hyperkalemia: Secondary | ICD-10-CM | POA: Diagnosis not present

## 2018-08-14 DIAGNOSIS — N2581 Secondary hyperparathyroidism of renal origin: Secondary | ICD-10-CM | POA: Diagnosis not present

## 2018-08-14 DIAGNOSIS — D631 Anemia in chronic kidney disease: Secondary | ICD-10-CM | POA: Diagnosis not present

## 2018-08-14 DIAGNOSIS — D689 Coagulation defect, unspecified: Secondary | ICD-10-CM | POA: Diagnosis not present

## 2018-08-14 DIAGNOSIS — D509 Iron deficiency anemia, unspecified: Secondary | ICD-10-CM | POA: Diagnosis not present

## 2018-08-14 DIAGNOSIS — N186 End stage renal disease: Secondary | ICD-10-CM | POA: Diagnosis not present

## 2018-08-14 NOTE — Discharge Summary (Signed)
Patient left AMA, please see progress note from 10/10. Eulogio Bear DO

## 2018-08-17 DIAGNOSIS — E875 Hyperkalemia: Secondary | ICD-10-CM | POA: Diagnosis not present

## 2018-08-17 DIAGNOSIS — D689 Coagulation defect, unspecified: Secondary | ICD-10-CM | POA: Diagnosis not present

## 2018-08-17 DIAGNOSIS — N186 End stage renal disease: Secondary | ICD-10-CM | POA: Diagnosis not present

## 2018-08-17 DIAGNOSIS — N2581 Secondary hyperparathyroidism of renal origin: Secondary | ICD-10-CM | POA: Diagnosis not present

## 2018-08-17 DIAGNOSIS — D631 Anemia in chronic kidney disease: Secondary | ICD-10-CM | POA: Diagnosis not present

## 2018-08-17 DIAGNOSIS — D509 Iron deficiency anemia, unspecified: Secondary | ICD-10-CM | POA: Diagnosis not present

## 2018-08-18 ENCOUNTER — Other Ambulatory Visit: Payer: Medicare Other

## 2018-08-19 DIAGNOSIS — N186 End stage renal disease: Secondary | ICD-10-CM | POA: Diagnosis not present

## 2018-08-19 DIAGNOSIS — D631 Anemia in chronic kidney disease: Secondary | ICD-10-CM | POA: Diagnosis not present

## 2018-08-19 DIAGNOSIS — D509 Iron deficiency anemia, unspecified: Secondary | ICD-10-CM | POA: Diagnosis not present

## 2018-08-19 DIAGNOSIS — D689 Coagulation defect, unspecified: Secondary | ICD-10-CM | POA: Diagnosis not present

## 2018-08-19 DIAGNOSIS — N2581 Secondary hyperparathyroidism of renal origin: Secondary | ICD-10-CM | POA: Diagnosis not present

## 2018-08-19 DIAGNOSIS — E875 Hyperkalemia: Secondary | ICD-10-CM | POA: Diagnosis not present

## 2018-08-21 DIAGNOSIS — E875 Hyperkalemia: Secondary | ICD-10-CM | POA: Diagnosis not present

## 2018-08-21 DIAGNOSIS — D631 Anemia in chronic kidney disease: Secondary | ICD-10-CM | POA: Diagnosis not present

## 2018-08-21 DIAGNOSIS — N186 End stage renal disease: Secondary | ICD-10-CM | POA: Diagnosis not present

## 2018-08-21 DIAGNOSIS — D509 Iron deficiency anemia, unspecified: Secondary | ICD-10-CM | POA: Diagnosis not present

## 2018-08-21 DIAGNOSIS — D689 Coagulation defect, unspecified: Secondary | ICD-10-CM | POA: Diagnosis not present

## 2018-08-21 DIAGNOSIS — N2581 Secondary hyperparathyroidism of renal origin: Secondary | ICD-10-CM | POA: Diagnosis not present

## 2018-08-24 DIAGNOSIS — E875 Hyperkalemia: Secondary | ICD-10-CM | POA: Diagnosis not present

## 2018-08-24 DIAGNOSIS — D631 Anemia in chronic kidney disease: Secondary | ICD-10-CM | POA: Diagnosis not present

## 2018-08-24 DIAGNOSIS — D689 Coagulation defect, unspecified: Secondary | ICD-10-CM | POA: Diagnosis not present

## 2018-08-24 DIAGNOSIS — N2581 Secondary hyperparathyroidism of renal origin: Secondary | ICD-10-CM | POA: Diagnosis not present

## 2018-08-24 DIAGNOSIS — N186 End stage renal disease: Secondary | ICD-10-CM | POA: Diagnosis not present

## 2018-08-24 DIAGNOSIS — D509 Iron deficiency anemia, unspecified: Secondary | ICD-10-CM | POA: Diagnosis not present

## 2018-08-26 DIAGNOSIS — N186 End stage renal disease: Secondary | ICD-10-CM | POA: Diagnosis not present

## 2018-08-26 DIAGNOSIS — E875 Hyperkalemia: Secondary | ICD-10-CM | POA: Diagnosis not present

## 2018-08-26 DIAGNOSIS — D631 Anemia in chronic kidney disease: Secondary | ICD-10-CM | POA: Diagnosis not present

## 2018-08-26 DIAGNOSIS — N2581 Secondary hyperparathyroidism of renal origin: Secondary | ICD-10-CM | POA: Diagnosis not present

## 2018-08-26 DIAGNOSIS — D509 Iron deficiency anemia, unspecified: Secondary | ICD-10-CM | POA: Diagnosis not present

## 2018-08-26 DIAGNOSIS — D689 Coagulation defect, unspecified: Secondary | ICD-10-CM | POA: Diagnosis not present

## 2018-08-28 DIAGNOSIS — N186 End stage renal disease: Secondary | ICD-10-CM | POA: Diagnosis not present

## 2018-08-28 DIAGNOSIS — D689 Coagulation defect, unspecified: Secondary | ICD-10-CM | POA: Diagnosis not present

## 2018-08-28 DIAGNOSIS — D631 Anemia in chronic kidney disease: Secondary | ICD-10-CM | POA: Diagnosis not present

## 2018-08-28 DIAGNOSIS — D509 Iron deficiency anemia, unspecified: Secondary | ICD-10-CM | POA: Diagnosis not present

## 2018-08-28 DIAGNOSIS — E875 Hyperkalemia: Secondary | ICD-10-CM | POA: Diagnosis not present

## 2018-08-28 DIAGNOSIS — N2581 Secondary hyperparathyroidism of renal origin: Secondary | ICD-10-CM | POA: Diagnosis not present

## 2018-08-31 DIAGNOSIS — D509 Iron deficiency anemia, unspecified: Secondary | ICD-10-CM | POA: Diagnosis not present

## 2018-08-31 DIAGNOSIS — N186 End stage renal disease: Secondary | ICD-10-CM | POA: Diagnosis not present

## 2018-08-31 DIAGNOSIS — E875 Hyperkalemia: Secondary | ICD-10-CM | POA: Diagnosis not present

## 2018-08-31 DIAGNOSIS — D689 Coagulation defect, unspecified: Secondary | ICD-10-CM | POA: Diagnosis not present

## 2018-08-31 DIAGNOSIS — D631 Anemia in chronic kidney disease: Secondary | ICD-10-CM | POA: Diagnosis not present

## 2018-08-31 DIAGNOSIS — N2581 Secondary hyperparathyroidism of renal origin: Secondary | ICD-10-CM | POA: Diagnosis not present

## 2018-09-02 ENCOUNTER — Encounter: Payer: Self-pay | Admitting: Neurology

## 2018-09-02 DIAGNOSIS — D689 Coagulation defect, unspecified: Secondary | ICD-10-CM | POA: Diagnosis not present

## 2018-09-02 DIAGNOSIS — D631 Anemia in chronic kidney disease: Secondary | ICD-10-CM | POA: Diagnosis not present

## 2018-09-02 DIAGNOSIS — E875 Hyperkalemia: Secondary | ICD-10-CM | POA: Diagnosis not present

## 2018-09-02 DIAGNOSIS — N2581 Secondary hyperparathyroidism of renal origin: Secondary | ICD-10-CM | POA: Diagnosis not present

## 2018-09-02 DIAGNOSIS — D509 Iron deficiency anemia, unspecified: Secondary | ICD-10-CM | POA: Diagnosis not present

## 2018-09-02 DIAGNOSIS — N186 End stage renal disease: Secondary | ICD-10-CM | POA: Diagnosis not present

## 2018-09-04 DIAGNOSIS — D631 Anemia in chronic kidney disease: Secondary | ICD-10-CM | POA: Diagnosis not present

## 2018-09-04 DIAGNOSIS — I129 Hypertensive chronic kidney disease with stage 1 through stage 4 chronic kidney disease, or unspecified chronic kidney disease: Secondary | ICD-10-CM | POA: Diagnosis not present

## 2018-09-04 DIAGNOSIS — D509 Iron deficiency anemia, unspecified: Secondary | ICD-10-CM | POA: Diagnosis not present

## 2018-09-04 DIAGNOSIS — N2581 Secondary hyperparathyroidism of renal origin: Secondary | ICD-10-CM | POA: Diagnosis not present

## 2018-09-04 DIAGNOSIS — N186 End stage renal disease: Secondary | ICD-10-CM | POA: Diagnosis not present

## 2018-09-04 DIAGNOSIS — E875 Hyperkalemia: Secondary | ICD-10-CM | POA: Diagnosis not present

## 2018-09-04 DIAGNOSIS — Z992 Dependence on renal dialysis: Secondary | ICD-10-CM | POA: Diagnosis not present

## 2018-09-07 DIAGNOSIS — D509 Iron deficiency anemia, unspecified: Secondary | ICD-10-CM | POA: Diagnosis not present

## 2018-09-07 DIAGNOSIS — N186 End stage renal disease: Secondary | ICD-10-CM | POA: Diagnosis not present

## 2018-09-07 DIAGNOSIS — N2581 Secondary hyperparathyroidism of renal origin: Secondary | ICD-10-CM | POA: Diagnosis not present

## 2018-09-07 DIAGNOSIS — E875 Hyperkalemia: Secondary | ICD-10-CM | POA: Diagnosis not present

## 2018-09-07 DIAGNOSIS — D631 Anemia in chronic kidney disease: Secondary | ICD-10-CM | POA: Diagnosis not present

## 2018-09-07 DIAGNOSIS — Z992 Dependence on renal dialysis: Secondary | ICD-10-CM | POA: Diagnosis not present

## 2018-09-08 ENCOUNTER — Other Ambulatory Visit: Payer: Self-pay

## 2018-09-08 DIAGNOSIS — I721 Aneurysm of artery of upper extremity: Secondary | ICD-10-CM

## 2018-09-08 DIAGNOSIS — M7989 Other specified soft tissue disorders: Secondary | ICD-10-CM

## 2018-09-09 DIAGNOSIS — D509 Iron deficiency anemia, unspecified: Secondary | ICD-10-CM | POA: Diagnosis not present

## 2018-09-09 DIAGNOSIS — N186 End stage renal disease: Secondary | ICD-10-CM | POA: Diagnosis not present

## 2018-09-09 DIAGNOSIS — E875 Hyperkalemia: Secondary | ICD-10-CM | POA: Diagnosis not present

## 2018-09-09 DIAGNOSIS — Z992 Dependence on renal dialysis: Secondary | ICD-10-CM | POA: Diagnosis not present

## 2018-09-09 DIAGNOSIS — N2581 Secondary hyperparathyroidism of renal origin: Secondary | ICD-10-CM | POA: Diagnosis not present

## 2018-09-09 DIAGNOSIS — D631 Anemia in chronic kidney disease: Secondary | ICD-10-CM | POA: Diagnosis not present

## 2018-09-10 DIAGNOSIS — Z992 Dependence on renal dialysis: Secondary | ICD-10-CM | POA: Diagnosis not present

## 2018-09-10 DIAGNOSIS — D509 Iron deficiency anemia, unspecified: Secondary | ICD-10-CM | POA: Diagnosis not present

## 2018-09-10 DIAGNOSIS — E875 Hyperkalemia: Secondary | ICD-10-CM | POA: Diagnosis not present

## 2018-09-10 DIAGNOSIS — N186 End stage renal disease: Secondary | ICD-10-CM | POA: Diagnosis not present

## 2018-09-10 DIAGNOSIS — D631 Anemia in chronic kidney disease: Secondary | ICD-10-CM | POA: Diagnosis not present

## 2018-09-10 DIAGNOSIS — N2581 Secondary hyperparathyroidism of renal origin: Secondary | ICD-10-CM | POA: Diagnosis not present

## 2018-09-14 DIAGNOSIS — D509 Iron deficiency anemia, unspecified: Secondary | ICD-10-CM | POA: Diagnosis not present

## 2018-09-14 DIAGNOSIS — N186 End stage renal disease: Secondary | ICD-10-CM | POA: Diagnosis not present

## 2018-09-14 DIAGNOSIS — Z992 Dependence on renal dialysis: Secondary | ICD-10-CM | POA: Diagnosis not present

## 2018-09-14 DIAGNOSIS — D631 Anemia in chronic kidney disease: Secondary | ICD-10-CM | POA: Diagnosis not present

## 2018-09-14 DIAGNOSIS — N2581 Secondary hyperparathyroidism of renal origin: Secondary | ICD-10-CM | POA: Diagnosis not present

## 2018-09-14 DIAGNOSIS — E875 Hyperkalemia: Secondary | ICD-10-CM | POA: Diagnosis not present

## 2018-09-15 ENCOUNTER — Encounter (HOSPITAL_COMMUNITY): Payer: Medicare Other

## 2018-09-15 ENCOUNTER — Ambulatory Visit: Payer: Medicare Other | Admitting: Family

## 2018-09-16 DIAGNOSIS — D631 Anemia in chronic kidney disease: Secondary | ICD-10-CM | POA: Diagnosis not present

## 2018-09-16 DIAGNOSIS — N2581 Secondary hyperparathyroidism of renal origin: Secondary | ICD-10-CM | POA: Diagnosis not present

## 2018-09-16 DIAGNOSIS — Z992 Dependence on renal dialysis: Secondary | ICD-10-CM | POA: Diagnosis not present

## 2018-09-16 DIAGNOSIS — N186 End stage renal disease: Secondary | ICD-10-CM | POA: Diagnosis not present

## 2018-09-16 DIAGNOSIS — E875 Hyperkalemia: Secondary | ICD-10-CM | POA: Diagnosis not present

## 2018-09-16 DIAGNOSIS — D509 Iron deficiency anemia, unspecified: Secondary | ICD-10-CM | POA: Diagnosis not present

## 2018-09-18 DIAGNOSIS — D509 Iron deficiency anemia, unspecified: Secondary | ICD-10-CM | POA: Diagnosis not present

## 2018-09-18 DIAGNOSIS — E875 Hyperkalemia: Secondary | ICD-10-CM | POA: Diagnosis not present

## 2018-09-18 DIAGNOSIS — N2581 Secondary hyperparathyroidism of renal origin: Secondary | ICD-10-CM | POA: Diagnosis not present

## 2018-09-18 DIAGNOSIS — N186 End stage renal disease: Secondary | ICD-10-CM | POA: Diagnosis not present

## 2018-09-18 DIAGNOSIS — Z992 Dependence on renal dialysis: Secondary | ICD-10-CM | POA: Diagnosis not present

## 2018-09-18 DIAGNOSIS — D631 Anemia in chronic kidney disease: Secondary | ICD-10-CM | POA: Diagnosis not present

## 2018-09-21 DIAGNOSIS — N186 End stage renal disease: Secondary | ICD-10-CM | POA: Diagnosis not present

## 2018-09-21 DIAGNOSIS — N2581 Secondary hyperparathyroidism of renal origin: Secondary | ICD-10-CM | POA: Diagnosis not present

## 2018-09-21 DIAGNOSIS — D631 Anemia in chronic kidney disease: Secondary | ICD-10-CM | POA: Diagnosis not present

## 2018-09-21 DIAGNOSIS — E875 Hyperkalemia: Secondary | ICD-10-CM | POA: Diagnosis not present

## 2018-09-21 DIAGNOSIS — D509 Iron deficiency anemia, unspecified: Secondary | ICD-10-CM | POA: Diagnosis not present

## 2018-09-21 DIAGNOSIS — Z992 Dependence on renal dialysis: Secondary | ICD-10-CM | POA: Diagnosis not present

## 2018-09-23 DIAGNOSIS — N2581 Secondary hyperparathyroidism of renal origin: Secondary | ICD-10-CM | POA: Diagnosis not present

## 2018-09-23 DIAGNOSIS — D509 Iron deficiency anemia, unspecified: Secondary | ICD-10-CM | POA: Diagnosis not present

## 2018-09-23 DIAGNOSIS — E875 Hyperkalemia: Secondary | ICD-10-CM | POA: Diagnosis not present

## 2018-09-23 DIAGNOSIS — N186 End stage renal disease: Secondary | ICD-10-CM | POA: Diagnosis not present

## 2018-09-23 DIAGNOSIS — D631 Anemia in chronic kidney disease: Secondary | ICD-10-CM | POA: Diagnosis not present

## 2018-09-23 DIAGNOSIS — Z992 Dependence on renal dialysis: Secondary | ICD-10-CM | POA: Diagnosis not present

## 2018-09-25 DIAGNOSIS — N186 End stage renal disease: Secondary | ICD-10-CM | POA: Diagnosis not present

## 2018-09-25 DIAGNOSIS — N2581 Secondary hyperparathyroidism of renal origin: Secondary | ICD-10-CM | POA: Diagnosis not present

## 2018-09-25 DIAGNOSIS — Z992 Dependence on renal dialysis: Secondary | ICD-10-CM | POA: Diagnosis not present

## 2018-09-25 DIAGNOSIS — D509 Iron deficiency anemia, unspecified: Secondary | ICD-10-CM | POA: Diagnosis not present

## 2018-09-25 DIAGNOSIS — D631 Anemia in chronic kidney disease: Secondary | ICD-10-CM | POA: Diagnosis not present

## 2018-09-25 DIAGNOSIS — E875 Hyperkalemia: Secondary | ICD-10-CM | POA: Diagnosis not present

## 2018-09-27 DIAGNOSIS — D631 Anemia in chronic kidney disease: Secondary | ICD-10-CM | POA: Diagnosis not present

## 2018-09-27 DIAGNOSIS — Z992 Dependence on renal dialysis: Secondary | ICD-10-CM | POA: Diagnosis not present

## 2018-09-27 DIAGNOSIS — N186 End stage renal disease: Secondary | ICD-10-CM | POA: Diagnosis not present

## 2018-09-27 DIAGNOSIS — N2581 Secondary hyperparathyroidism of renal origin: Secondary | ICD-10-CM | POA: Diagnosis not present

## 2018-09-27 DIAGNOSIS — E875 Hyperkalemia: Secondary | ICD-10-CM | POA: Diagnosis not present

## 2018-09-27 DIAGNOSIS — D509 Iron deficiency anemia, unspecified: Secondary | ICD-10-CM | POA: Diagnosis not present

## 2018-09-29 ENCOUNTER — Encounter: Payer: Self-pay | Admitting: Family

## 2018-09-29 ENCOUNTER — Other Ambulatory Visit: Payer: Self-pay

## 2018-09-29 ENCOUNTER — Ambulatory Visit (INDEPENDENT_AMBULATORY_CARE_PROVIDER_SITE_OTHER): Payer: Medicare Other | Admitting: Family

## 2018-09-29 ENCOUNTER — Ambulatory Visit (HOSPITAL_COMMUNITY)
Admission: RE | Admit: 2018-09-29 | Discharge: 2018-09-29 | Disposition: A | Discharge status: Home / Self Care | Payer: Medicare Other | Source: Ambulatory Visit | Attending: Family | Admitting: Family

## 2018-09-29 VITALS — BP 164/104 | HR 74 | Temp 97.5°F | Resp 16 | Ht 68.0 in | Wt 137.0 lb

## 2018-09-29 DIAGNOSIS — Z992 Dependence on renal dialysis: Secondary | ICD-10-CM

## 2018-09-29 DIAGNOSIS — M79602 Pain in left arm: Secondary | ICD-10-CM | POA: Diagnosis not present

## 2018-09-29 DIAGNOSIS — N186 End stage renal disease: Secondary | ICD-10-CM

## 2018-09-29 DIAGNOSIS — I721 Aneurysm of artery of upper extremity: Secondary | ICD-10-CM | POA: Diagnosis not present

## 2018-09-29 DIAGNOSIS — M7989 Other specified soft tissue disorders: Secondary | ICD-10-CM | POA: Diagnosis not present

## 2018-09-29 NOTE — Progress Notes (Signed)
CC: pain in left arm x 2-3 years, history of left arm AVF, ESRD on hemodialysis via TDC  History of Present Illness  Marvin Roberson is a 47 y.o. ([birth year]) male who is s/p resection of brachial false aneurysm and replacement with reversed great sapheous vein on 01-14-18 by Dr. 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   Pt has had multiple prior left arm renal access procedures and attempted repair at his false aneurysm.  He had resection of this area and replacement with reverse great saphenous vein from his left thigh.  Dr. Donnetta Hutching last evaluated pt on 02-24-18. At that time vein harvest and antecubital incision both healed quite nicely.  No evidence of aneurysm.  Easily palpable left ulnar pulse Stable overall.  Pt was to continue his full activities without limitation.  Reported that he is moving back to [place] within the next 1 to 2 months.  Pt was to return on an as-needed basis.   He dialyzes M-W-F via right upper chest TDC; he states both arms have had HD accesses, and are no longer possible sites for new permanent access.   He returns today referred by Dr. Arty Baumgartner, with c/o left arm pain and swelling for 2-3 years, states this is no worse than it has been. He states that he is here today since his left arm had some swelling last week.   Pt was seen in the ED on 08-12-18 with hyperkalemia, and left AMA.     Tobacco use: 1/2 ppd x 20 years     Past Medical History:  Diagnosis Date  . Adrenal insufficiency ([place])   . Dyspnea    "when I have too much fluid"  . ESRD on dialysis [facility]) since 1990s   "M,W,F; [address]" (11/27/2016)  . GERD (gastroesophageal reflux disease)   . High cholesterol   . History of blood transfusion   . Hypertension   . Kidney failure   . Renal insufficiency   . Stroke [facility]) 2016   decreased vision in his left eye/notes 11/27/2016    Social History Social History   Tobacco Use  . Smoking status: Current Every Day Smoker      Packs/day: 0.50    Years: 20.00    Pack years: 10.00    Types: Cigarettes  . Smokeless tobacco: Never Used  . Tobacco comment: 1 pk every 2 days.  Substance Use Topics  . Alcohol use: Yes    Comment: occ  . Drug use: No    Family History Family History  Problem Relation Age of Onset  . Hypertension Other   . Adrenal disorder Neg Hx     Surgical History Past Surgical History:  Procedure Laterality Date  . ANGIOPLASTY Left 12/11/2016   Procedure: ANGIOPLASTY LEFT ARM & SUBCLAVIAN ARTERY;  Surgeon: Conrad Nicollet, MD;  Location: [place];  Service: Vascular;  Laterality: Left;  . arm surgery Left 2016   "for aneurysm"  . AV FISTULA PLACEMENT    . FALSE ANEURYSM REPAIR Left 12/11/2016   Procedure: RESECTION  BRACHIAL ARTERY;  Surgeon: Conrad Malmo, MD;  Location: [place];  Service: Vascular;  Laterality: Left;  . FALSE ANEURYSM REPAIR Left 01/14/2018   Procedure: REPAIR FALSE ANEURYSM LEFT BRACHIAL ARTERY WITH SAPHENOUS VEIN  LEFT ARTERIOVENOUS GRAFT;  Surgeon: Rosetta Posner, MD;  Location: [place];  Service: Vascular;  Laterality: Left;  . INSERTION OF DIALYSIS CATHETER Right    chest  . THROMBECTOMY BRACHIAL ARTERY  12/11/2016   Procedure: THROMBECTOMY BRACHIAL ARTERY AND ULNAR;  Surgeon: Conrad [place], MD;  Location: [place];  Service: Vascular;;  . THYROIDECTOMY, PARTIAL    . UPPER EXTREMITY ANGIOGRAM Left 12/11/2016   Procedure: LEFT ARM ANGIOGRAM;  Surgeon: Conrad Wimer, MD;  Location: [place];  Service: Vascular;  Laterality: Left;  Marland Kitchen VEIN HARVEST Left 01/14/2018   Procedure: VEIN HARVEST LEFT SAPHENOUS VEIN;  Surgeon: Rosetta Posner, MD;  Location: [place];  Service: Vascular;  Laterality: Left;  . WOUND EXPLORATION Left 12/11/2016   Procedure: LEFT ARM BRACHIAL ARTERY WITH INTERPOSTIONAL GRAFT;  Surgeon: Conrad Bayport, MD;  Location: [place];  Service: Vascular;  Laterality: Left;    Allergies  Allergen Reactions  . Doxycycline Shortness Of Breath  . Codeine Nausea And Vomiting  . Gabapentin  Other (See Comments)    Sleepiness, altered mental state  . Lisinopril Other (See Comments) and Swelling    angioedema  . Povidone-Iodine Itching  . Betadine [Povidone Iodine] Rash    Current Outpatient Medications  Medication Sig Dispense Refill  . aspirin EC 81 MG tablet Take 81 mg by mouth daily.    Marland Kitchen atorvastatin (LIPITOR) 40 MG tablet Take 40 mg by mouth daily.    . carvedilol (COREG) 25 MG tablet Take 25 mg by mouth 2 (two) times daily with a meal.    . folic acid-vitamin b complex-vitamin c-selenium-zinc (DIALYVITE) 3 MG TABS tablet Take 1 tablet by mouth daily.    . hydrALAZINE (APRESOLINE) 100 MG tablet Take 100 mg by mouth 3 (three) times daily.    . hydrocortisone (CORTEF) 5 MG tablet TAKE 1 TABLET BY MOUTH TWICE A DAY (Patient taking differently: Take 5 mg by mouth daily. ) 30 tablet 0  . losartan (COZAAR) 100 MG tablet Take 100 mg by mouth daily.    . NONFORMULARY OR COMPOUNDED ITEM [facility]:  Onychomycosis Nail Lacquer - Fluconazole 2%, Terbinafine 1%, DMSO apply to affected areas daily. 120 each 11  . omeprazole (PRILOSEC) 40 MG capsule Take 40 mg by mouth daily.     No current facility-administered medications for this visit.      REVIEW OF SYSTEMS: see HPI for pertinent positives and negatives    PHYSICAL EXAMINATION:  Vitals:   09/29/18 1427  BP: (!) 164/104  Pulse: 74  Resp: 16  Temp: (!) 97.5 F (36.4 C)  TempSrc: Oral  SpO2: 97%  Weight: 137 lb (62.1 kg)  Height: 5\' 8"  (1.727 m)   Body mass index is 20.83 kg/m.  General: The patient appears his stated age.   HEENT:  No gross abnormalities Pulmonary: Respirations are non-labored Abdomen: Soft and non-tender. Musculoskeletal: There are no major deformities.   Neurologic: No focal weakness or paresthesias are detected, he states numbness in his left hand Skin: There are no ulcer or rashes noted. Psychiatric: The patient expresses frustration that he had to wait a few minutes, he seems  angry.  Cardiovascular: There is a regular rate and rhythm, left ulnar pulse is 1-2+ palpable. No swelling in either upper extremity.     Medical Decision Making  Marvin Roberson is a 47 y.o. male who is s/p resection of brachial false aneurysm and replacement with reversed great sapheous vein on 01-14-18 by Dr. 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   Pt has had multiple prior left arm renal access procedures and attempted repair at his false aneurysm.  He had resection of this area and replacement with  reverse great saphenous vein from his left thigh.  Pt was seen in the ED on 08-12-18 with hyperkalemia, and left AMA.   Pt left abruptly, did not want to wait until I could speak with Dr. Scot Dock and discuss his case. I spoke with Dr. 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  This is not dangerous, and to be expected in a previous access that is no longer used nor usable.  He dialyzes via a tunneled dialysis catheter in his right upper chest.   Follow up as needed.    Clemon Chambers, RN, MSN, FNP-C [facility] Office: [phone]  09/29/2018, 2:53 PM  Clinic MD: Scot Dock on call

## 2018-09-30 DIAGNOSIS — N186 End stage renal disease: Secondary | ICD-10-CM | POA: Diagnosis not present

## 2018-09-30 DIAGNOSIS — D631 Anemia in chronic kidney disease: Secondary | ICD-10-CM | POA: Diagnosis not present

## 2018-09-30 DIAGNOSIS — D509 Iron deficiency anemia, unspecified: Secondary | ICD-10-CM | POA: Diagnosis not present

## 2018-09-30 DIAGNOSIS — N2581 Secondary hyperparathyroidism of renal origin: Secondary | ICD-10-CM | POA: Diagnosis not present

## 2018-09-30 DIAGNOSIS — E875 Hyperkalemia: Secondary | ICD-10-CM | POA: Diagnosis not present

## 2018-09-30 DIAGNOSIS — Z992 Dependence on renal dialysis: Secondary | ICD-10-CM | POA: Diagnosis not present

## 2018-10-02 DIAGNOSIS — N186 End stage renal disease: Secondary | ICD-10-CM | POA: Diagnosis not present

## 2018-10-02 DIAGNOSIS — N2581 Secondary hyperparathyroidism of renal origin: Secondary | ICD-10-CM | POA: Diagnosis not present

## 2018-10-02 DIAGNOSIS — D631 Anemia in chronic kidney disease: Secondary | ICD-10-CM | POA: Diagnosis not present

## 2018-10-02 DIAGNOSIS — D509 Iron deficiency anemia, unspecified: Secondary | ICD-10-CM | POA: Diagnosis not present

## 2018-10-02 DIAGNOSIS — Z992 Dependence on renal dialysis: Secondary | ICD-10-CM | POA: Diagnosis not present

## 2018-10-02 DIAGNOSIS — E875 Hyperkalemia: Secondary | ICD-10-CM | POA: Diagnosis not present

## 2018-10-04 DIAGNOSIS — I129 Hypertensive chronic kidney disease with stage 1 through stage 4 chronic kidney disease, or unspecified chronic kidney disease: Secondary | ICD-10-CM | POA: Diagnosis not present

## 2018-10-04 DIAGNOSIS — N186 End stage renal disease: Secondary | ICD-10-CM | POA: Diagnosis not present

## 2018-10-04 DIAGNOSIS — Z992 Dependence on renal dialysis: Secondary | ICD-10-CM | POA: Diagnosis not present

## 2018-10-05 DIAGNOSIS — Z992 Dependence on renal dialysis: Secondary | ICD-10-CM | POA: Diagnosis not present

## 2018-10-05 DIAGNOSIS — D509 Iron deficiency anemia, unspecified: Secondary | ICD-10-CM | POA: Diagnosis not present

## 2018-10-05 DIAGNOSIS — N2581 Secondary hyperparathyroidism of renal origin: Secondary | ICD-10-CM | POA: Diagnosis not present

## 2018-10-05 DIAGNOSIS — N186 End stage renal disease: Secondary | ICD-10-CM | POA: Diagnosis not present

## 2018-10-05 DIAGNOSIS — E875 Hyperkalemia: Secondary | ICD-10-CM | POA: Diagnosis not present

## 2018-10-05 DIAGNOSIS — D631 Anemia in chronic kidney disease: Secondary | ICD-10-CM | POA: Diagnosis not present

## 2018-10-07 DIAGNOSIS — N186 End stage renal disease: Secondary | ICD-10-CM | POA: Diagnosis not present

## 2018-10-07 DIAGNOSIS — N2581 Secondary hyperparathyroidism of renal origin: Secondary | ICD-10-CM | POA: Diagnosis not present

## 2018-10-07 DIAGNOSIS — E875 Hyperkalemia: Secondary | ICD-10-CM | POA: Diagnosis not present

## 2018-10-07 DIAGNOSIS — D509 Iron deficiency anemia, unspecified: Secondary | ICD-10-CM | POA: Diagnosis not present

## 2018-10-07 DIAGNOSIS — Z992 Dependence on renal dialysis: Secondary | ICD-10-CM | POA: Diagnosis not present

## 2018-10-07 DIAGNOSIS — D631 Anemia in chronic kidney disease: Secondary | ICD-10-CM | POA: Diagnosis not present

## 2018-10-09 DIAGNOSIS — N186 End stage renal disease: Secondary | ICD-10-CM | POA: Diagnosis not present

## 2018-10-09 DIAGNOSIS — D631 Anemia in chronic kidney disease: Secondary | ICD-10-CM | POA: Diagnosis not present

## 2018-10-09 DIAGNOSIS — Z992 Dependence on renal dialysis: Secondary | ICD-10-CM | POA: Diagnosis not present

## 2018-10-09 DIAGNOSIS — E875 Hyperkalemia: Secondary | ICD-10-CM | POA: Diagnosis not present

## 2018-10-09 DIAGNOSIS — D509 Iron deficiency anemia, unspecified: Secondary | ICD-10-CM | POA: Diagnosis not present

## 2018-10-09 DIAGNOSIS — N2581 Secondary hyperparathyroidism of renal origin: Secondary | ICD-10-CM | POA: Diagnosis not present

## 2018-10-12 DIAGNOSIS — Z992 Dependence on renal dialysis: Secondary | ICD-10-CM | POA: Diagnosis not present

## 2018-10-12 DIAGNOSIS — N2581 Secondary hyperparathyroidism of renal origin: Secondary | ICD-10-CM | POA: Diagnosis not present

## 2018-10-12 DIAGNOSIS — N186 End stage renal disease: Secondary | ICD-10-CM | POA: Diagnosis not present

## 2018-10-12 DIAGNOSIS — D631 Anemia in chronic kidney disease: Secondary | ICD-10-CM | POA: Diagnosis not present

## 2018-10-12 DIAGNOSIS — E875 Hyperkalemia: Secondary | ICD-10-CM | POA: Diagnosis not present

## 2018-10-12 DIAGNOSIS — D509 Iron deficiency anemia, unspecified: Secondary | ICD-10-CM | POA: Diagnosis not present

## 2018-10-13 ENCOUNTER — Encounter: Payer: Medicare Other | Admitting: Neurology

## 2018-10-14 DIAGNOSIS — Z992 Dependence on renal dialysis: Secondary | ICD-10-CM | POA: Diagnosis not present

## 2018-10-14 DIAGNOSIS — N2581 Secondary hyperparathyroidism of renal origin: Secondary | ICD-10-CM | POA: Diagnosis not present

## 2018-10-14 DIAGNOSIS — D631 Anemia in chronic kidney disease: Secondary | ICD-10-CM | POA: Diagnosis not present

## 2018-10-14 DIAGNOSIS — E875 Hyperkalemia: Secondary | ICD-10-CM | POA: Diagnosis not present

## 2018-10-14 DIAGNOSIS — D509 Iron deficiency anemia, unspecified: Secondary | ICD-10-CM | POA: Diagnosis not present

## 2018-10-14 DIAGNOSIS — N186 End stage renal disease: Secondary | ICD-10-CM | POA: Diagnosis not present

## 2018-10-16 DIAGNOSIS — Z992 Dependence on renal dialysis: Secondary | ICD-10-CM | POA: Diagnosis not present

## 2018-10-16 DIAGNOSIS — D631 Anemia in chronic kidney disease: Secondary | ICD-10-CM | POA: Diagnosis not present

## 2018-10-16 DIAGNOSIS — E875 Hyperkalemia: Secondary | ICD-10-CM | POA: Diagnosis not present

## 2018-10-16 DIAGNOSIS — N2581 Secondary hyperparathyroidism of renal origin: Secondary | ICD-10-CM | POA: Diagnosis not present

## 2018-10-16 DIAGNOSIS — N186 End stage renal disease: Secondary | ICD-10-CM | POA: Diagnosis not present

## 2018-10-16 DIAGNOSIS — D509 Iron deficiency anemia, unspecified: Secondary | ICD-10-CM | POA: Diagnosis not present

## 2018-10-19 DIAGNOSIS — E875 Hyperkalemia: Secondary | ICD-10-CM | POA: Diagnosis not present

## 2018-10-19 DIAGNOSIS — D631 Anemia in chronic kidney disease: Secondary | ICD-10-CM | POA: Diagnosis not present

## 2018-10-19 DIAGNOSIS — D509 Iron deficiency anemia, unspecified: Secondary | ICD-10-CM | POA: Diagnosis not present

## 2018-10-19 DIAGNOSIS — N2581 Secondary hyperparathyroidism of renal origin: Secondary | ICD-10-CM | POA: Diagnosis not present

## 2018-10-19 DIAGNOSIS — N186 End stage renal disease: Secondary | ICD-10-CM | POA: Diagnosis not present

## 2018-10-19 DIAGNOSIS — Z992 Dependence on renal dialysis: Secondary | ICD-10-CM | POA: Diagnosis not present

## 2018-10-21 DIAGNOSIS — E875 Hyperkalemia: Secondary | ICD-10-CM | POA: Diagnosis not present

## 2018-10-21 DIAGNOSIS — D509 Iron deficiency anemia, unspecified: Secondary | ICD-10-CM | POA: Diagnosis not present

## 2018-10-21 DIAGNOSIS — N186 End stage renal disease: Secondary | ICD-10-CM | POA: Diagnosis not present

## 2018-10-21 DIAGNOSIS — Z992 Dependence on renal dialysis: Secondary | ICD-10-CM | POA: Diagnosis not present

## 2018-10-21 DIAGNOSIS — N2581 Secondary hyperparathyroidism of renal origin: Secondary | ICD-10-CM | POA: Diagnosis not present

## 2018-10-21 DIAGNOSIS — D631 Anemia in chronic kidney disease: Secondary | ICD-10-CM | POA: Diagnosis not present

## 2018-10-23 DIAGNOSIS — D509 Iron deficiency anemia, unspecified: Secondary | ICD-10-CM | POA: Diagnosis not present

## 2018-10-23 DIAGNOSIS — Z992 Dependence on renal dialysis: Secondary | ICD-10-CM | POA: Diagnosis not present

## 2018-10-23 DIAGNOSIS — D631 Anemia in chronic kidney disease: Secondary | ICD-10-CM | POA: Diagnosis not present

## 2018-10-23 DIAGNOSIS — N2581 Secondary hyperparathyroidism of renal origin: Secondary | ICD-10-CM | POA: Diagnosis not present

## 2018-10-23 DIAGNOSIS — N186 End stage renal disease: Secondary | ICD-10-CM | POA: Diagnosis not present

## 2018-10-23 DIAGNOSIS — E875 Hyperkalemia: Secondary | ICD-10-CM | POA: Diagnosis not present

## 2018-10-26 DIAGNOSIS — Z992 Dependence on renal dialysis: Secondary | ICD-10-CM | POA: Diagnosis not present

## 2018-10-26 DIAGNOSIS — N2581 Secondary hyperparathyroidism of renal origin: Secondary | ICD-10-CM | POA: Diagnosis not present

## 2018-10-26 DIAGNOSIS — D509 Iron deficiency anemia, unspecified: Secondary | ICD-10-CM | POA: Diagnosis not present

## 2018-10-26 DIAGNOSIS — D631 Anemia in chronic kidney disease: Secondary | ICD-10-CM | POA: Diagnosis not present

## 2018-10-26 DIAGNOSIS — N186 End stage renal disease: Secondary | ICD-10-CM | POA: Diagnosis not present

## 2018-10-26 DIAGNOSIS — E875 Hyperkalemia: Secondary | ICD-10-CM | POA: Diagnosis not present

## 2018-10-29 DIAGNOSIS — Z992 Dependence on renal dialysis: Secondary | ICD-10-CM | POA: Diagnosis not present

## 2018-10-29 DIAGNOSIS — N2581 Secondary hyperparathyroidism of renal origin: Secondary | ICD-10-CM | POA: Diagnosis not present

## 2018-10-29 DIAGNOSIS — D631 Anemia in chronic kidney disease: Secondary | ICD-10-CM | POA: Diagnosis not present

## 2018-10-29 DIAGNOSIS — N186 End stage renal disease: Secondary | ICD-10-CM | POA: Diagnosis not present

## 2018-10-29 DIAGNOSIS — E875 Hyperkalemia: Secondary | ICD-10-CM | POA: Diagnosis not present

## 2018-10-29 DIAGNOSIS — D509 Iron deficiency anemia, unspecified: Secondary | ICD-10-CM | POA: Diagnosis not present

## 2018-11-01 DIAGNOSIS — D631 Anemia in chronic kidney disease: Secondary | ICD-10-CM | POA: Diagnosis not present

## 2018-11-01 DIAGNOSIS — N2581 Secondary hyperparathyroidism of renal origin: Secondary | ICD-10-CM | POA: Diagnosis not present

## 2018-11-01 DIAGNOSIS — E875 Hyperkalemia: Secondary | ICD-10-CM | POA: Diagnosis not present

## 2018-11-01 DIAGNOSIS — D509 Iron deficiency anemia, unspecified: Secondary | ICD-10-CM | POA: Diagnosis not present

## 2018-11-01 DIAGNOSIS — N186 End stage renal disease: Secondary | ICD-10-CM | POA: Diagnosis not present

## 2018-11-01 DIAGNOSIS — Z992 Dependence on renal dialysis: Secondary | ICD-10-CM | POA: Diagnosis not present

## 2018-11-03 DIAGNOSIS — D509 Iron deficiency anemia, unspecified: Secondary | ICD-10-CM | POA: Diagnosis not present

## 2018-11-03 DIAGNOSIS — N186 End stage renal disease: Secondary | ICD-10-CM | POA: Diagnosis not present

## 2018-11-03 DIAGNOSIS — E875 Hyperkalemia: Secondary | ICD-10-CM | POA: Diagnosis not present

## 2018-11-03 DIAGNOSIS — N2581 Secondary hyperparathyroidism of renal origin: Secondary | ICD-10-CM | POA: Diagnosis not present

## 2018-11-03 DIAGNOSIS — D631 Anemia in chronic kidney disease: Secondary | ICD-10-CM | POA: Diagnosis not present

## 2018-11-03 DIAGNOSIS — Z992 Dependence on renal dialysis: Secondary | ICD-10-CM | POA: Diagnosis not present

## 2018-11-04 DIAGNOSIS — I129 Hypertensive chronic kidney disease with stage 1 through stage 4 chronic kidney disease, or unspecified chronic kidney disease: Secondary | ICD-10-CM | POA: Diagnosis not present

## 2018-11-04 DIAGNOSIS — Z992 Dependence on renal dialysis: Secondary | ICD-10-CM | POA: Diagnosis not present

## 2018-11-04 DIAGNOSIS — N186 End stage renal disease: Secondary | ICD-10-CM | POA: Diagnosis not present

## 2018-11-06 DIAGNOSIS — D509 Iron deficiency anemia, unspecified: Secondary | ICD-10-CM | POA: Diagnosis not present

## 2018-11-06 DIAGNOSIS — D631 Anemia in chronic kidney disease: Secondary | ICD-10-CM | POA: Diagnosis not present

## 2018-11-06 DIAGNOSIS — Z992 Dependence on renal dialysis: Secondary | ICD-10-CM | POA: Diagnosis not present

## 2018-11-06 DIAGNOSIS — E875 Hyperkalemia: Secondary | ICD-10-CM | POA: Diagnosis not present

## 2018-11-06 DIAGNOSIS — N186 End stage renal disease: Secondary | ICD-10-CM | POA: Diagnosis not present

## 2018-11-06 DIAGNOSIS — N2581 Secondary hyperparathyroidism of renal origin: Secondary | ICD-10-CM | POA: Diagnosis not present

## 2018-11-09 DIAGNOSIS — D631 Anemia in chronic kidney disease: Secondary | ICD-10-CM | POA: Diagnosis not present

## 2018-11-09 DIAGNOSIS — E875 Hyperkalemia: Secondary | ICD-10-CM | POA: Diagnosis not present

## 2018-11-09 DIAGNOSIS — D509 Iron deficiency anemia, unspecified: Secondary | ICD-10-CM | POA: Diagnosis not present

## 2018-11-09 DIAGNOSIS — N2581 Secondary hyperparathyroidism of renal origin: Secondary | ICD-10-CM | POA: Diagnosis not present

## 2018-11-09 DIAGNOSIS — N186 End stage renal disease: Secondary | ICD-10-CM | POA: Diagnosis not present

## 2018-11-09 DIAGNOSIS — Z992 Dependence on renal dialysis: Secondary | ICD-10-CM | POA: Diagnosis not present

## 2018-11-11 DIAGNOSIS — D631 Anemia in chronic kidney disease: Secondary | ICD-10-CM | POA: Diagnosis not present

## 2018-11-11 DIAGNOSIS — Z992 Dependence on renal dialysis: Secondary | ICD-10-CM | POA: Diagnosis not present

## 2018-11-11 DIAGNOSIS — N2581 Secondary hyperparathyroidism of renal origin: Secondary | ICD-10-CM | POA: Diagnosis not present

## 2018-11-11 DIAGNOSIS — N186 End stage renal disease: Secondary | ICD-10-CM | POA: Diagnosis not present

## 2018-11-11 DIAGNOSIS — E875 Hyperkalemia: Secondary | ICD-10-CM | POA: Diagnosis not present

## 2018-11-11 DIAGNOSIS — D509 Iron deficiency anemia, unspecified: Secondary | ICD-10-CM | POA: Diagnosis not present

## 2018-11-13 DIAGNOSIS — D509 Iron deficiency anemia, unspecified: Secondary | ICD-10-CM | POA: Diagnosis not present

## 2018-11-13 DIAGNOSIS — D631 Anemia in chronic kidney disease: Secondary | ICD-10-CM | POA: Diagnosis not present

## 2018-11-13 DIAGNOSIS — E875 Hyperkalemia: Secondary | ICD-10-CM | POA: Diagnosis not present

## 2018-11-13 DIAGNOSIS — N2581 Secondary hyperparathyroidism of renal origin: Secondary | ICD-10-CM | POA: Diagnosis not present

## 2018-11-13 DIAGNOSIS — Z992 Dependence on renal dialysis: Secondary | ICD-10-CM | POA: Diagnosis not present

## 2018-11-13 DIAGNOSIS — N186 End stage renal disease: Secondary | ICD-10-CM | POA: Diagnosis not present

## 2018-11-16 DIAGNOSIS — N186 End stage renal disease: Secondary | ICD-10-CM | POA: Diagnosis not present

## 2018-11-16 DIAGNOSIS — D509 Iron deficiency anemia, unspecified: Secondary | ICD-10-CM | POA: Diagnosis not present

## 2018-11-16 DIAGNOSIS — N2581 Secondary hyperparathyroidism of renal origin: Secondary | ICD-10-CM | POA: Diagnosis not present

## 2018-11-16 DIAGNOSIS — D631 Anemia in chronic kidney disease: Secondary | ICD-10-CM | POA: Diagnosis not present

## 2018-11-16 DIAGNOSIS — E875 Hyperkalemia: Secondary | ICD-10-CM | POA: Diagnosis not present

## 2018-11-16 DIAGNOSIS — Z992 Dependence on renal dialysis: Secondary | ICD-10-CM | POA: Diagnosis not present

## 2018-11-17 ENCOUNTER — Ambulatory Visit: Payer: Medicare Other | Admitting: Neurology

## 2018-11-18 DIAGNOSIS — N186 End stage renal disease: Secondary | ICD-10-CM | POA: Diagnosis not present

## 2018-11-18 DIAGNOSIS — E875 Hyperkalemia: Secondary | ICD-10-CM | POA: Diagnosis not present

## 2018-11-18 DIAGNOSIS — N2581 Secondary hyperparathyroidism of renal origin: Secondary | ICD-10-CM | POA: Diagnosis not present

## 2018-11-18 DIAGNOSIS — Z992 Dependence on renal dialysis: Secondary | ICD-10-CM | POA: Diagnosis not present

## 2018-11-18 DIAGNOSIS — D631 Anemia in chronic kidney disease: Secondary | ICD-10-CM | POA: Diagnosis not present

## 2018-11-18 DIAGNOSIS — D509 Iron deficiency anemia, unspecified: Secondary | ICD-10-CM | POA: Diagnosis not present

## 2018-11-20 DIAGNOSIS — D509 Iron deficiency anemia, unspecified: Secondary | ICD-10-CM | POA: Diagnosis not present

## 2018-11-20 DIAGNOSIS — N2581 Secondary hyperparathyroidism of renal origin: Secondary | ICD-10-CM | POA: Diagnosis not present

## 2018-11-20 DIAGNOSIS — E875 Hyperkalemia: Secondary | ICD-10-CM | POA: Diagnosis not present

## 2018-11-20 DIAGNOSIS — Z992 Dependence on renal dialysis: Secondary | ICD-10-CM | POA: Diagnosis not present

## 2018-11-20 DIAGNOSIS — D631 Anemia in chronic kidney disease: Secondary | ICD-10-CM | POA: Diagnosis not present

## 2018-11-20 DIAGNOSIS — N186 End stage renal disease: Secondary | ICD-10-CM | POA: Diagnosis not present

## 2018-11-23 DIAGNOSIS — E875 Hyperkalemia: Secondary | ICD-10-CM | POA: Diagnosis not present

## 2018-11-23 DIAGNOSIS — Z992 Dependence on renal dialysis: Secondary | ICD-10-CM | POA: Diagnosis not present

## 2018-11-23 DIAGNOSIS — N186 End stage renal disease: Secondary | ICD-10-CM | POA: Diagnosis not present

## 2018-11-23 DIAGNOSIS — D509 Iron deficiency anemia, unspecified: Secondary | ICD-10-CM | POA: Diagnosis not present

## 2018-11-23 DIAGNOSIS — D631 Anemia in chronic kidney disease: Secondary | ICD-10-CM | POA: Diagnosis not present

## 2018-11-23 DIAGNOSIS — N2581 Secondary hyperparathyroidism of renal origin: Secondary | ICD-10-CM | POA: Diagnosis not present

## 2018-11-27 DIAGNOSIS — E875 Hyperkalemia: Secondary | ICD-10-CM | POA: Diagnosis not present

## 2018-11-27 DIAGNOSIS — D631 Anemia in chronic kidney disease: Secondary | ICD-10-CM | POA: Diagnosis not present

## 2018-11-27 DIAGNOSIS — N186 End stage renal disease: Secondary | ICD-10-CM | POA: Diagnosis not present

## 2018-11-27 DIAGNOSIS — Z992 Dependence on renal dialysis: Secondary | ICD-10-CM | POA: Diagnosis not present

## 2018-11-27 DIAGNOSIS — N2581 Secondary hyperparathyroidism of renal origin: Secondary | ICD-10-CM | POA: Diagnosis not present

## 2018-11-27 DIAGNOSIS — D509 Iron deficiency anemia, unspecified: Secondary | ICD-10-CM | POA: Diagnosis not present

## 2018-11-30 DIAGNOSIS — N186 End stage renal disease: Secondary | ICD-10-CM | POA: Diagnosis not present

## 2018-11-30 DIAGNOSIS — D509 Iron deficiency anemia, unspecified: Secondary | ICD-10-CM | POA: Diagnosis not present

## 2018-11-30 DIAGNOSIS — E875 Hyperkalemia: Secondary | ICD-10-CM | POA: Diagnosis not present

## 2018-11-30 DIAGNOSIS — Z992 Dependence on renal dialysis: Secondary | ICD-10-CM | POA: Diagnosis not present

## 2018-11-30 DIAGNOSIS — D631 Anemia in chronic kidney disease: Secondary | ICD-10-CM | POA: Diagnosis not present

## 2018-11-30 DIAGNOSIS — N2581 Secondary hyperparathyroidism of renal origin: Secondary | ICD-10-CM | POA: Diagnosis not present

## 2018-12-01 ENCOUNTER — Other Ambulatory Visit: Payer: Self-pay | Admitting: *Deleted

## 2018-12-02 ENCOUNTER — Ambulatory Visit: Payer: Medicare Other | Admitting: Neurology

## 2018-12-02 DIAGNOSIS — E875 Hyperkalemia: Secondary | ICD-10-CM | POA: Diagnosis not present

## 2018-12-02 DIAGNOSIS — Z992 Dependence on renal dialysis: Secondary | ICD-10-CM | POA: Diagnosis not present

## 2018-12-02 DIAGNOSIS — N186 End stage renal disease: Secondary | ICD-10-CM | POA: Diagnosis not present

## 2018-12-02 DIAGNOSIS — D631 Anemia in chronic kidney disease: Secondary | ICD-10-CM | POA: Diagnosis not present

## 2018-12-02 DIAGNOSIS — D509 Iron deficiency anemia, unspecified: Secondary | ICD-10-CM | POA: Diagnosis not present

## 2018-12-02 DIAGNOSIS — N2581 Secondary hyperparathyroidism of renal origin: Secondary | ICD-10-CM | POA: Diagnosis not present

## 2018-12-04 DIAGNOSIS — D631 Anemia in chronic kidney disease: Secondary | ICD-10-CM | POA: Diagnosis not present

## 2018-12-04 DIAGNOSIS — E875 Hyperkalemia: Secondary | ICD-10-CM | POA: Diagnosis not present

## 2018-12-04 DIAGNOSIS — Z992 Dependence on renal dialysis: Secondary | ICD-10-CM | POA: Diagnosis not present

## 2018-12-04 DIAGNOSIS — N186 End stage renal disease: Secondary | ICD-10-CM | POA: Diagnosis not present

## 2018-12-04 DIAGNOSIS — N2581 Secondary hyperparathyroidism of renal origin: Secondary | ICD-10-CM | POA: Diagnosis not present

## 2018-12-04 DIAGNOSIS — D509 Iron deficiency anemia, unspecified: Secondary | ICD-10-CM | POA: Diagnosis not present

## 2018-12-05 DIAGNOSIS — N186 End stage renal disease: Secondary | ICD-10-CM | POA: Diagnosis not present

## 2018-12-05 DIAGNOSIS — I129 Hypertensive chronic kidney disease with stage 1 through stage 4 chronic kidney disease, or unspecified chronic kidney disease: Secondary | ICD-10-CM | POA: Diagnosis not present

## 2018-12-05 DIAGNOSIS — Z992 Dependence on renal dialysis: Secondary | ICD-10-CM | POA: Diagnosis not present

## 2018-12-07 DIAGNOSIS — N2581 Secondary hyperparathyroidism of renal origin: Secondary | ICD-10-CM | POA: Diagnosis not present

## 2018-12-07 DIAGNOSIS — D631 Anemia in chronic kidney disease: Secondary | ICD-10-CM | POA: Diagnosis not present

## 2018-12-07 DIAGNOSIS — E875 Hyperkalemia: Secondary | ICD-10-CM | POA: Diagnosis not present

## 2018-12-07 DIAGNOSIS — N186 End stage renal disease: Secondary | ICD-10-CM | POA: Diagnosis not present

## 2018-12-07 DIAGNOSIS — Z992 Dependence on renal dialysis: Secondary | ICD-10-CM | POA: Diagnosis not present

## 2018-12-07 DIAGNOSIS — D509 Iron deficiency anemia, unspecified: Secondary | ICD-10-CM | POA: Diagnosis not present

## 2018-12-09 DIAGNOSIS — E875 Hyperkalemia: Secondary | ICD-10-CM | POA: Diagnosis not present

## 2018-12-09 DIAGNOSIS — N186 End stage renal disease: Secondary | ICD-10-CM | POA: Diagnosis not present

## 2018-12-09 DIAGNOSIS — N2581 Secondary hyperparathyroidism of renal origin: Secondary | ICD-10-CM | POA: Diagnosis not present

## 2018-12-09 DIAGNOSIS — D631 Anemia in chronic kidney disease: Secondary | ICD-10-CM | POA: Diagnosis not present

## 2018-12-09 DIAGNOSIS — D509 Iron deficiency anemia, unspecified: Secondary | ICD-10-CM | POA: Diagnosis not present

## 2018-12-09 DIAGNOSIS — Z992 Dependence on renal dialysis: Secondary | ICD-10-CM | POA: Diagnosis not present

## 2018-12-11 DIAGNOSIS — D631 Anemia in chronic kidney disease: Secondary | ICD-10-CM | POA: Diagnosis not present

## 2018-12-11 DIAGNOSIS — Z992 Dependence on renal dialysis: Secondary | ICD-10-CM | POA: Diagnosis not present

## 2018-12-11 DIAGNOSIS — D509 Iron deficiency anemia, unspecified: Secondary | ICD-10-CM | POA: Diagnosis not present

## 2018-12-11 DIAGNOSIS — E875 Hyperkalemia: Secondary | ICD-10-CM | POA: Diagnosis not present

## 2018-12-11 DIAGNOSIS — N2581 Secondary hyperparathyroidism of renal origin: Secondary | ICD-10-CM | POA: Diagnosis not present

## 2018-12-11 DIAGNOSIS — N186 End stage renal disease: Secondary | ICD-10-CM | POA: Diagnosis not present

## 2018-12-14 DIAGNOSIS — Z992 Dependence on renal dialysis: Secondary | ICD-10-CM | POA: Diagnosis not present

## 2018-12-14 DIAGNOSIS — D509 Iron deficiency anemia, unspecified: Secondary | ICD-10-CM | POA: Diagnosis not present

## 2018-12-14 DIAGNOSIS — D631 Anemia in chronic kidney disease: Secondary | ICD-10-CM | POA: Diagnosis not present

## 2018-12-14 DIAGNOSIS — N2581 Secondary hyperparathyroidism of renal origin: Secondary | ICD-10-CM | POA: Diagnosis not present

## 2018-12-14 DIAGNOSIS — E875 Hyperkalemia: Secondary | ICD-10-CM | POA: Diagnosis not present

## 2018-12-14 DIAGNOSIS — N186 End stage renal disease: Secondary | ICD-10-CM | POA: Diagnosis not present

## 2018-12-16 DIAGNOSIS — N2581 Secondary hyperparathyroidism of renal origin: Secondary | ICD-10-CM | POA: Diagnosis not present

## 2018-12-16 DIAGNOSIS — D631 Anemia in chronic kidney disease: Secondary | ICD-10-CM | POA: Diagnosis not present

## 2018-12-16 DIAGNOSIS — N186 End stage renal disease: Secondary | ICD-10-CM | POA: Diagnosis not present

## 2018-12-16 DIAGNOSIS — Z992 Dependence on renal dialysis: Secondary | ICD-10-CM | POA: Diagnosis not present

## 2018-12-16 DIAGNOSIS — E875 Hyperkalemia: Secondary | ICD-10-CM | POA: Diagnosis not present

## 2018-12-16 DIAGNOSIS — D509 Iron deficiency anemia, unspecified: Secondary | ICD-10-CM | POA: Diagnosis not present

## 2018-12-18 DIAGNOSIS — D509 Iron deficiency anemia, unspecified: Secondary | ICD-10-CM | POA: Diagnosis not present

## 2018-12-18 DIAGNOSIS — N2581 Secondary hyperparathyroidism of renal origin: Secondary | ICD-10-CM | POA: Diagnosis not present

## 2018-12-18 DIAGNOSIS — D631 Anemia in chronic kidney disease: Secondary | ICD-10-CM | POA: Diagnosis not present

## 2018-12-18 DIAGNOSIS — N186 End stage renal disease: Secondary | ICD-10-CM | POA: Diagnosis not present

## 2018-12-18 DIAGNOSIS — Z992 Dependence on renal dialysis: Secondary | ICD-10-CM | POA: Diagnosis not present

## 2018-12-18 DIAGNOSIS — E875 Hyperkalemia: Secondary | ICD-10-CM | POA: Diagnosis not present

## 2018-12-21 DIAGNOSIS — Z992 Dependence on renal dialysis: Secondary | ICD-10-CM | POA: Diagnosis not present

## 2018-12-21 DIAGNOSIS — D509 Iron deficiency anemia, unspecified: Secondary | ICD-10-CM | POA: Diagnosis not present

## 2018-12-21 DIAGNOSIS — N2581 Secondary hyperparathyroidism of renal origin: Secondary | ICD-10-CM | POA: Diagnosis not present

## 2018-12-21 DIAGNOSIS — E875 Hyperkalemia: Secondary | ICD-10-CM | POA: Diagnosis not present

## 2018-12-21 DIAGNOSIS — N186 End stage renal disease: Secondary | ICD-10-CM | POA: Diagnosis not present

## 2018-12-21 DIAGNOSIS — D631 Anemia in chronic kidney disease: Secondary | ICD-10-CM | POA: Diagnosis not present

## 2018-12-23 DIAGNOSIS — N2581 Secondary hyperparathyroidism of renal origin: Secondary | ICD-10-CM | POA: Diagnosis not present

## 2018-12-23 DIAGNOSIS — Z992 Dependence on renal dialysis: Secondary | ICD-10-CM | POA: Diagnosis not present

## 2018-12-23 DIAGNOSIS — D631 Anemia in chronic kidney disease: Secondary | ICD-10-CM | POA: Diagnosis not present

## 2018-12-23 DIAGNOSIS — D509 Iron deficiency anemia, unspecified: Secondary | ICD-10-CM | POA: Diagnosis not present

## 2018-12-23 DIAGNOSIS — E875 Hyperkalemia: Secondary | ICD-10-CM | POA: Diagnosis not present

## 2018-12-23 DIAGNOSIS — N186 End stage renal disease: Secondary | ICD-10-CM | POA: Diagnosis not present

## 2018-12-25 DIAGNOSIS — D631 Anemia in chronic kidney disease: Secondary | ICD-10-CM | POA: Diagnosis not present

## 2018-12-25 DIAGNOSIS — N2581 Secondary hyperparathyroidism of renal origin: Secondary | ICD-10-CM | POA: Diagnosis not present

## 2018-12-25 DIAGNOSIS — D509 Iron deficiency anemia, unspecified: Secondary | ICD-10-CM | POA: Diagnosis not present

## 2018-12-25 DIAGNOSIS — Z992 Dependence on renal dialysis: Secondary | ICD-10-CM | POA: Diagnosis not present

## 2018-12-25 DIAGNOSIS — N186 End stage renal disease: Secondary | ICD-10-CM | POA: Diagnosis not present

## 2018-12-25 DIAGNOSIS — E875 Hyperkalemia: Secondary | ICD-10-CM | POA: Diagnosis not present

## 2018-12-28 DIAGNOSIS — D631 Anemia in chronic kidney disease: Secondary | ICD-10-CM | POA: Diagnosis not present

## 2018-12-28 DIAGNOSIS — Z992 Dependence on renal dialysis: Secondary | ICD-10-CM | POA: Diagnosis not present

## 2018-12-28 DIAGNOSIS — N2581 Secondary hyperparathyroidism of renal origin: Secondary | ICD-10-CM | POA: Diagnosis not present

## 2018-12-28 DIAGNOSIS — E875 Hyperkalemia: Secondary | ICD-10-CM | POA: Diagnosis not present

## 2018-12-28 DIAGNOSIS — D509 Iron deficiency anemia, unspecified: Secondary | ICD-10-CM | POA: Diagnosis not present

## 2018-12-28 DIAGNOSIS — N186 End stage renal disease: Secondary | ICD-10-CM | POA: Diagnosis not present

## 2018-12-30 DIAGNOSIS — D631 Anemia in chronic kidney disease: Secondary | ICD-10-CM | POA: Diagnosis not present

## 2018-12-30 DIAGNOSIS — Z992 Dependence on renal dialysis: Secondary | ICD-10-CM | POA: Diagnosis not present

## 2018-12-30 DIAGNOSIS — E875 Hyperkalemia: Secondary | ICD-10-CM | POA: Diagnosis not present

## 2018-12-30 DIAGNOSIS — D509 Iron deficiency anemia, unspecified: Secondary | ICD-10-CM | POA: Diagnosis not present

## 2018-12-30 DIAGNOSIS — N186 End stage renal disease: Secondary | ICD-10-CM | POA: Diagnosis not present

## 2018-12-30 DIAGNOSIS — N2581 Secondary hyperparathyroidism of renal origin: Secondary | ICD-10-CM | POA: Diagnosis not present

## 2018-12-31 ENCOUNTER — Ambulatory Visit: Payer: Medicare Other | Admitting: Podiatry

## 2019-01-01 DIAGNOSIS — D509 Iron deficiency anemia, unspecified: Secondary | ICD-10-CM | POA: Diagnosis not present

## 2019-01-01 DIAGNOSIS — N186 End stage renal disease: Secondary | ICD-10-CM | POA: Diagnosis not present

## 2019-01-01 DIAGNOSIS — E875 Hyperkalemia: Secondary | ICD-10-CM | POA: Diagnosis not present

## 2019-01-01 DIAGNOSIS — Z992 Dependence on renal dialysis: Secondary | ICD-10-CM | POA: Diagnosis not present

## 2019-01-01 DIAGNOSIS — D631 Anemia in chronic kidney disease: Secondary | ICD-10-CM | POA: Diagnosis not present

## 2019-01-01 DIAGNOSIS — N2581 Secondary hyperparathyroidism of renal origin: Secondary | ICD-10-CM | POA: Diagnosis not present

## 2019-01-03 DIAGNOSIS — N186 End stage renal disease: Secondary | ICD-10-CM | POA: Diagnosis not present

## 2019-01-03 DIAGNOSIS — Z992 Dependence on renal dialysis: Secondary | ICD-10-CM | POA: Diagnosis not present

## 2019-01-03 DIAGNOSIS — I129 Hypertensive chronic kidney disease with stage 1 through stage 4 chronic kidney disease, or unspecified chronic kidney disease: Secondary | ICD-10-CM | POA: Diagnosis not present

## 2019-01-04 DIAGNOSIS — E875 Hyperkalemia: Secondary | ICD-10-CM | POA: Diagnosis not present

## 2019-01-04 DIAGNOSIS — D631 Anemia in chronic kidney disease: Secondary | ICD-10-CM | POA: Diagnosis not present

## 2019-01-04 DIAGNOSIS — Z992 Dependence on renal dialysis: Secondary | ICD-10-CM | POA: Diagnosis not present

## 2019-01-04 DIAGNOSIS — N2581 Secondary hyperparathyroidism of renal origin: Secondary | ICD-10-CM | POA: Diagnosis not present

## 2019-01-04 DIAGNOSIS — N186 End stage renal disease: Secondary | ICD-10-CM | POA: Diagnosis not present

## 2019-01-05 ENCOUNTER — Ambulatory Visit: Payer: Medicare Other | Admitting: Podiatry

## 2019-01-06 DIAGNOSIS — D631 Anemia in chronic kidney disease: Secondary | ICD-10-CM | POA: Diagnosis not present

## 2019-01-06 DIAGNOSIS — Z992 Dependence on renal dialysis: Secondary | ICD-10-CM | POA: Diagnosis not present

## 2019-01-06 DIAGNOSIS — N2581 Secondary hyperparathyroidism of renal origin: Secondary | ICD-10-CM | POA: Diagnosis not present

## 2019-01-06 DIAGNOSIS — N186 End stage renal disease: Secondary | ICD-10-CM | POA: Diagnosis not present

## 2019-01-06 DIAGNOSIS — E875 Hyperkalemia: Secondary | ICD-10-CM | POA: Diagnosis not present

## 2019-01-11 DIAGNOSIS — D631 Anemia in chronic kidney disease: Secondary | ICD-10-CM | POA: Diagnosis not present

## 2019-01-11 DIAGNOSIS — N2581 Secondary hyperparathyroidism of renal origin: Secondary | ICD-10-CM | POA: Diagnosis not present

## 2019-01-11 DIAGNOSIS — N186 End stage renal disease: Secondary | ICD-10-CM | POA: Diagnosis not present

## 2019-01-11 DIAGNOSIS — Z992 Dependence on renal dialysis: Secondary | ICD-10-CM | POA: Diagnosis not present

## 2019-01-11 DIAGNOSIS — E875 Hyperkalemia: Secondary | ICD-10-CM | POA: Diagnosis not present

## 2019-01-13 DIAGNOSIS — Z992 Dependence on renal dialysis: Secondary | ICD-10-CM | POA: Diagnosis not present

## 2019-01-13 DIAGNOSIS — E875 Hyperkalemia: Secondary | ICD-10-CM | POA: Diagnosis not present

## 2019-01-13 DIAGNOSIS — N2581 Secondary hyperparathyroidism of renal origin: Secondary | ICD-10-CM | POA: Diagnosis not present

## 2019-01-13 DIAGNOSIS — N186 End stage renal disease: Secondary | ICD-10-CM | POA: Diagnosis not present

## 2019-01-13 DIAGNOSIS — D631 Anemia in chronic kidney disease: Secondary | ICD-10-CM | POA: Diagnosis not present

## 2019-01-15 DIAGNOSIS — N2581 Secondary hyperparathyroidism of renal origin: Secondary | ICD-10-CM | POA: Diagnosis not present

## 2019-01-15 DIAGNOSIS — D631 Anemia in chronic kidney disease: Secondary | ICD-10-CM | POA: Diagnosis not present

## 2019-01-15 DIAGNOSIS — Z992 Dependence on renal dialysis: Secondary | ICD-10-CM | POA: Diagnosis not present

## 2019-01-15 DIAGNOSIS — E875 Hyperkalemia: Secondary | ICD-10-CM | POA: Diagnosis not present

## 2019-01-15 DIAGNOSIS — N186 End stage renal disease: Secondary | ICD-10-CM | POA: Diagnosis not present

## 2019-01-18 DIAGNOSIS — N186 End stage renal disease: Secondary | ICD-10-CM | POA: Diagnosis not present

## 2019-01-18 DIAGNOSIS — E875 Hyperkalemia: Secondary | ICD-10-CM | POA: Diagnosis not present

## 2019-01-18 DIAGNOSIS — N2581 Secondary hyperparathyroidism of renal origin: Secondary | ICD-10-CM | POA: Diagnosis not present

## 2019-01-18 DIAGNOSIS — D631 Anemia in chronic kidney disease: Secondary | ICD-10-CM | POA: Diagnosis not present

## 2019-01-18 DIAGNOSIS — Z992 Dependence on renal dialysis: Secondary | ICD-10-CM | POA: Diagnosis not present

## 2019-01-22 DIAGNOSIS — D631 Anemia in chronic kidney disease: Secondary | ICD-10-CM | POA: Diagnosis not present

## 2019-01-22 DIAGNOSIS — Z992 Dependence on renal dialysis: Secondary | ICD-10-CM | POA: Diagnosis not present

## 2019-01-22 DIAGNOSIS — E875 Hyperkalemia: Secondary | ICD-10-CM | POA: Diagnosis not present

## 2019-01-22 DIAGNOSIS — N2581 Secondary hyperparathyroidism of renal origin: Secondary | ICD-10-CM | POA: Diagnosis not present

## 2019-01-22 DIAGNOSIS — N186 End stage renal disease: Secondary | ICD-10-CM | POA: Diagnosis not present

## 2019-01-25 DIAGNOSIS — N2581 Secondary hyperparathyroidism of renal origin: Secondary | ICD-10-CM | POA: Diagnosis not present

## 2019-01-25 DIAGNOSIS — E875 Hyperkalemia: Secondary | ICD-10-CM | POA: Diagnosis not present

## 2019-01-25 DIAGNOSIS — N186 End stage renal disease: Secondary | ICD-10-CM | POA: Diagnosis not present

## 2019-01-25 DIAGNOSIS — Z992 Dependence on renal dialysis: Secondary | ICD-10-CM | POA: Diagnosis not present

## 2019-01-25 DIAGNOSIS — D631 Anemia in chronic kidney disease: Secondary | ICD-10-CM | POA: Diagnosis not present

## 2019-01-26 ENCOUNTER — Ambulatory Visit: Payer: Medicare Other | Admitting: Neurology

## 2019-01-27 DIAGNOSIS — E875 Hyperkalemia: Secondary | ICD-10-CM | POA: Diagnosis not present

## 2019-01-27 DIAGNOSIS — N2581 Secondary hyperparathyroidism of renal origin: Secondary | ICD-10-CM | POA: Diagnosis not present

## 2019-01-27 DIAGNOSIS — D631 Anemia in chronic kidney disease: Secondary | ICD-10-CM | POA: Diagnosis not present

## 2019-01-27 DIAGNOSIS — Z992 Dependence on renal dialysis: Secondary | ICD-10-CM | POA: Diagnosis not present

## 2019-01-27 DIAGNOSIS — N186 End stage renal disease: Secondary | ICD-10-CM | POA: Diagnosis not present

## 2019-01-28 ENCOUNTER — Encounter: Payer: Self-pay | Admitting: Neurology

## 2019-01-28 ENCOUNTER — Ambulatory Visit (INDEPENDENT_AMBULATORY_CARE_PROVIDER_SITE_OTHER): Payer: Medicare Other | Admitting: Neurology

## 2019-01-28 ENCOUNTER — Telehealth: Payer: Self-pay | Admitting: Neurology

## 2019-01-28 DIAGNOSIS — R51 Headache: Secondary | ICD-10-CM

## 2019-01-28 DIAGNOSIS — G8929 Other chronic pain: Secondary | ICD-10-CM

## 2019-01-28 DIAGNOSIS — R519 Headache, unspecified: Secondary | ICD-10-CM | POA: Insufficient documentation

## 2019-01-28 MED ORDER — BUTALBITAL-APAP-CAFFEINE 50-325-40 MG PO TABS: 1.0000 | ORAL_TABLET | Freq: Four times a day (QID) | ORAL | 3 refills | Status: DC | PRN

## 2019-01-28 NOTE — Progress Notes (Signed)
Virtual Visit via phone  I connected with Marvin Roberson on 01/28/19 at  By phone and verified that I am speaking with the correct person using two identifiers.   I discussed the limitations, risks, security and privacy concerns of performing an evaluation and management service by phone and the availability of in person appointments. I also discussed with the patient that there may be a patient responsible charge related to this service. The patient expressed understanding and agreed to proceed.   History of Present Illness: Marvin Roberson is a 48 year old male, with history of hypertension, end-stage renal disease, on hemodialysis for more than 20 years, is referred by his nephrologist PA Anice Paganini for evaluation of  frequent headaches, initial evaluation was on January 28, 2019 via phone interview.  He has poorly controlled hypertension for many years, has been on hemodialysis for more than 20 years, Monday Wednesday Friday, also smoke half pack a day, denies drinks, lives alone, able to drive to hemodialysis  He reported more than 10 years history of intermittent headaches, it happens worse, during his headache days, it can happen back-to-back lasting few hours to days, holoacranial retro-orbital area headaches with associated light, noise sensitivity, mild nausea, lying down in dark quiet room was helpful, Tylenol as needed was helpful  He denies visual change, lateralized motor or sensory deficit with his headaches.   Observations/Objective: I have reviewed problem lists, medications, allergies.  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  The proximal portion of the left interosseous and ulnar arteries are patent.  Assessment and Plan: Chronic  headaches  With migraine features  Suboptimal response to over-the-counter Tylenol  I have called in Fioricet 1 tablet as needed, 10 tablets for 1 month  He will call clinic for worsening headaches or any focal signs,  Follow Up Instructions:  In 2 to 3 months    I discussed the assessment and treatment plan with the patient. The patient was provided an opportunity to ask questions and all were answered. The patient agreed with the plan and demonstrated an understanding of the instructions.   The patient was advised to call back or seek an in-person evaluation if the symptoms worsen or if the condition fails to improve as anticipated.  I provided 21  minutes of non-face-to-face time during this encounter.    Marvin Pacas, MD

## 2019-01-28 NOTE — Telephone Encounter (Signed)
Virtual visit is documented.

## 2019-01-29 DIAGNOSIS — D631 Anemia in chronic kidney disease: Secondary | ICD-10-CM | POA: Diagnosis not present

## 2019-01-29 DIAGNOSIS — N186 End stage renal disease: Secondary | ICD-10-CM | POA: Diagnosis not present

## 2019-01-29 DIAGNOSIS — Z992 Dependence on renal dialysis: Secondary | ICD-10-CM | POA: Diagnosis not present

## 2019-01-29 DIAGNOSIS — N2581 Secondary hyperparathyroidism of renal origin: Secondary | ICD-10-CM | POA: Diagnosis not present

## 2019-01-29 DIAGNOSIS — E875 Hyperkalemia: Secondary | ICD-10-CM | POA: Diagnosis not present

## 2019-02-01 DIAGNOSIS — D631 Anemia in chronic kidney disease: Secondary | ICD-10-CM | POA: Diagnosis not present

## 2019-02-01 DIAGNOSIS — N186 End stage renal disease: Secondary | ICD-10-CM | POA: Diagnosis not present

## 2019-02-01 DIAGNOSIS — N2581 Secondary hyperparathyroidism of renal origin: Secondary | ICD-10-CM | POA: Diagnosis not present

## 2019-02-01 DIAGNOSIS — Z992 Dependence on renal dialysis: Secondary | ICD-10-CM | POA: Diagnosis not present

## 2019-02-01 DIAGNOSIS — E875 Hyperkalemia: Secondary | ICD-10-CM | POA: Diagnosis not present

## 2019-02-03 DIAGNOSIS — Z992 Dependence on renal dialysis: Secondary | ICD-10-CM | POA: Diagnosis not present

## 2019-02-03 DIAGNOSIS — E875 Hyperkalemia: Secondary | ICD-10-CM | POA: Diagnosis not present

## 2019-02-03 DIAGNOSIS — N186 End stage renal disease: Secondary | ICD-10-CM | POA: Diagnosis not present

## 2019-02-03 DIAGNOSIS — N2581 Secondary hyperparathyroidism of renal origin: Secondary | ICD-10-CM | POA: Diagnosis not present

## 2019-02-03 DIAGNOSIS — D631 Anemia in chronic kidney disease: Secondary | ICD-10-CM | POA: Diagnosis not present

## 2019-02-03 DIAGNOSIS — I129 Hypertensive chronic kidney disease with stage 1 through stage 4 chronic kidney disease, or unspecified chronic kidney disease: Secondary | ICD-10-CM | POA: Diagnosis not present

## 2019-02-05 DIAGNOSIS — Z992 Dependence on renal dialysis: Secondary | ICD-10-CM | POA: Diagnosis not present

## 2019-02-05 DIAGNOSIS — E875 Hyperkalemia: Secondary | ICD-10-CM | POA: Diagnosis not present

## 2019-02-05 DIAGNOSIS — D631 Anemia in chronic kidney disease: Secondary | ICD-10-CM | POA: Diagnosis not present

## 2019-02-05 DIAGNOSIS — N2581 Secondary hyperparathyroidism of renal origin: Secondary | ICD-10-CM | POA: Diagnosis not present

## 2019-02-05 DIAGNOSIS — N186 End stage renal disease: Secondary | ICD-10-CM | POA: Diagnosis not present

## 2019-02-08 DIAGNOSIS — Z992 Dependence on renal dialysis: Secondary | ICD-10-CM | POA: Diagnosis not present

## 2019-02-08 DIAGNOSIS — N2581 Secondary hyperparathyroidism of renal origin: Secondary | ICD-10-CM | POA: Diagnosis not present

## 2019-02-08 DIAGNOSIS — E875 Hyperkalemia: Secondary | ICD-10-CM | POA: Diagnosis not present

## 2019-02-08 DIAGNOSIS — D631 Anemia in chronic kidney disease: Secondary | ICD-10-CM | POA: Diagnosis not present

## 2019-02-08 DIAGNOSIS — N186 End stage renal disease: Secondary | ICD-10-CM | POA: Diagnosis not present

## 2019-02-10 DIAGNOSIS — N2581 Secondary hyperparathyroidism of renal origin: Secondary | ICD-10-CM | POA: Diagnosis not present

## 2019-02-10 DIAGNOSIS — D631 Anemia in chronic kidney disease: Secondary | ICD-10-CM | POA: Diagnosis not present

## 2019-02-10 DIAGNOSIS — Z992 Dependence on renal dialysis: Secondary | ICD-10-CM | POA: Diagnosis not present

## 2019-02-10 DIAGNOSIS — E875 Hyperkalemia: Secondary | ICD-10-CM | POA: Diagnosis not present

## 2019-02-10 DIAGNOSIS — N186 End stage renal disease: Secondary | ICD-10-CM | POA: Diagnosis not present

## 2019-02-12 DIAGNOSIS — Z992 Dependence on renal dialysis: Secondary | ICD-10-CM | POA: Diagnosis not present

## 2019-02-12 DIAGNOSIS — D631 Anemia in chronic kidney disease: Secondary | ICD-10-CM | POA: Diagnosis not present

## 2019-02-12 DIAGNOSIS — E875 Hyperkalemia: Secondary | ICD-10-CM | POA: Diagnosis not present

## 2019-02-12 DIAGNOSIS — N2581 Secondary hyperparathyroidism of renal origin: Secondary | ICD-10-CM | POA: Diagnosis not present

## 2019-02-12 DIAGNOSIS — N186 End stage renal disease: Secondary | ICD-10-CM | POA: Diagnosis not present

## 2019-02-15 DIAGNOSIS — Z992 Dependence on renal dialysis: Secondary | ICD-10-CM | POA: Diagnosis not present

## 2019-02-15 DIAGNOSIS — N2581 Secondary hyperparathyroidism of renal origin: Secondary | ICD-10-CM | POA: Diagnosis not present

## 2019-02-15 DIAGNOSIS — D631 Anemia in chronic kidney disease: Secondary | ICD-10-CM | POA: Diagnosis not present

## 2019-02-15 DIAGNOSIS — N186 End stage renal disease: Secondary | ICD-10-CM | POA: Diagnosis not present

## 2019-02-15 DIAGNOSIS — E875 Hyperkalemia: Secondary | ICD-10-CM | POA: Diagnosis not present

## 2019-02-17 DIAGNOSIS — N186 End stage renal disease: Secondary | ICD-10-CM | POA: Diagnosis not present

## 2019-02-17 DIAGNOSIS — E875 Hyperkalemia: Secondary | ICD-10-CM | POA: Diagnosis not present

## 2019-02-17 DIAGNOSIS — N2581 Secondary hyperparathyroidism of renal origin: Secondary | ICD-10-CM | POA: Diagnosis not present

## 2019-02-17 DIAGNOSIS — D631 Anemia in chronic kidney disease: Secondary | ICD-10-CM | POA: Diagnosis not present

## 2019-02-17 DIAGNOSIS — Z992 Dependence on renal dialysis: Secondary | ICD-10-CM | POA: Diagnosis not present

## 2019-02-18 ENCOUNTER — Ambulatory Visit: Payer: Medicare Other | Attending: Family Medicine | Admitting: Physician Assistant

## 2019-02-18 ENCOUNTER — Other Ambulatory Visit: Payer: Self-pay

## 2019-02-18 DIAGNOSIS — F419 Anxiety disorder, unspecified: Secondary | ICD-10-CM

## 2019-02-18 DIAGNOSIS — R454 Irritability and anger: Secondary | ICD-10-CM | POA: Diagnosis not present

## 2019-02-18 DIAGNOSIS — G4709 Other insomnia: Secondary | ICD-10-CM | POA: Diagnosis not present

## 2019-02-18 MED ORDER — BUSPIRONE HCL 10 MG PO TABS: 10.0000 mg | ORAL_TABLET | Freq: Two times a day (BID) | ORAL | 2 refills | Status: DC

## 2019-02-18 NOTE — Progress Notes (Signed)
Patient ID: Marvin Roberson, male   DOB: [birth year], 48 y.o.   MRN: [id number] Virtual Visit via Telephone Note  I connected with Marvin Roberson on 02/18/19 at  8:50 AM EDT by telephone and verified that I am speaking with the correct person using two identifiers.   I discussed the limitations, risks, security and privacy concerns of performing an evaluation and management service by telephone and the availability of in person appointments. I also discussed with the patient that there may be a patient responsible charge related to this service. The patient expressed understanding and agreed to proceed.  The patient is at home and the only person on the visit.  I am in my office.    History of Present Illness: Patient c/o increased anxiety with stressors of the quarantine.  Years ago, he took xanax which helped but he didn't want to be on meds.  Denies SI/HI.  Poor sleep.  Irritable more than usual.    Depression screen [facility] 2/9 02/18/2019 05/27/2017  Decreased Interest 1 1  Down, Depressed, Hopeless 1 1  PHQ - 2 Score 2 2  Altered sleeping 1 2  Tired, decreased energy 1 1  Change in appetite 2 2  Feeling bad or failure about yourself  1 0  Trouble concentrating 0 0  Moving slowly or fidgety/restless 1 0  Suicidal thoughts 0 0  PHQ-9 Score 8 7   GAD 7 : Generalized Anxiety Score 02/18/2019 05/27/2017  Nervous, Anxious, on Edge 2 2  Control/stop worrying 2 2  Worry too much - different things 2 3  Trouble relaxing 3 3  Restless 2 2  Easily annoyed or irritable 1 3  Afraid - awful might happen 1 0  Total GAD 7 Score 13 15       Observations/Objective:   Assessment and Plan: 1. Anxiety Self-care, deep breathing techniques discussed.  - busPIRone (BUSPAR) 10 MG tablet; Take 1 tablet (10 mg total) by mouth 2 (two) times daily.  Dispense: 60 tablet; Refill: 2 -I offered to have our social worker call him, but he declined.    Follow Up Instructions: See PCP in 6 weeks;  Sooner if needed.      I discussed the assessment and treatment plan with the patient. The patient was provided an opportunity to ask questions and all were answered. The patient agreed with the plan and demonstrated an understanding of the instructions.   The patient was advised to call back or seek an in-person evaluation if the symptoms worsen or if the condition fails to improve as anticipated.  I provided 11 minutes of non-face-to-face time during this encounter.   Freeman Caldron, PA-C

## 2019-02-18 NOTE — Progress Notes (Signed)
Called patient to work him up for his televisit with Freeman Caldron, PA. Repeated PHQ9 & GAD7. PHQ9= 8 & GAD7= 13

## 2019-02-19 DIAGNOSIS — Z992 Dependence on renal dialysis: Secondary | ICD-10-CM | POA: Diagnosis not present

## 2019-02-19 DIAGNOSIS — N2581 Secondary hyperparathyroidism of renal origin: Secondary | ICD-10-CM | POA: Diagnosis not present

## 2019-02-19 DIAGNOSIS — N186 End stage renal disease: Secondary | ICD-10-CM | POA: Diagnosis not present

## 2019-02-19 DIAGNOSIS — E875 Hyperkalemia: Secondary | ICD-10-CM | POA: Diagnosis not present

## 2019-02-19 DIAGNOSIS — D631 Anemia in chronic kidney disease: Secondary | ICD-10-CM | POA: Diagnosis not present

## 2019-02-22 DIAGNOSIS — E875 Hyperkalemia: Secondary | ICD-10-CM | POA: Diagnosis not present

## 2019-02-22 DIAGNOSIS — D631 Anemia in chronic kidney disease: Secondary | ICD-10-CM | POA: Diagnosis not present

## 2019-02-22 DIAGNOSIS — N2581 Secondary hyperparathyroidism of renal origin: Secondary | ICD-10-CM | POA: Diagnosis not present

## 2019-02-22 DIAGNOSIS — Z992 Dependence on renal dialysis: Secondary | ICD-10-CM | POA: Diagnosis not present

## 2019-02-22 DIAGNOSIS — N186 End stage renal disease: Secondary | ICD-10-CM | POA: Diagnosis not present

## 2019-02-26 DIAGNOSIS — Z992 Dependence on renal dialysis: Secondary | ICD-10-CM | POA: Diagnosis not present

## 2019-02-26 DIAGNOSIS — N186 End stage renal disease: Secondary | ICD-10-CM | POA: Diagnosis not present

## 2019-02-26 DIAGNOSIS — D631 Anemia in chronic kidney disease: Secondary | ICD-10-CM | POA: Diagnosis not present

## 2019-02-26 DIAGNOSIS — E875 Hyperkalemia: Secondary | ICD-10-CM | POA: Diagnosis not present

## 2019-02-26 DIAGNOSIS — N2581 Secondary hyperparathyroidism of renal origin: Secondary | ICD-10-CM | POA: Diagnosis not present

## 2019-03-01 ENCOUNTER — Encounter: Payer: Self-pay | Admitting: Podiatry

## 2019-03-01 ENCOUNTER — Ambulatory Visit (INDEPENDENT_AMBULATORY_CARE_PROVIDER_SITE_OTHER): Payer: Medicare Other | Admitting: Podiatry

## 2019-03-01 ENCOUNTER — Other Ambulatory Visit: Payer: Self-pay

## 2019-03-01 DIAGNOSIS — N2581 Secondary hyperparathyroidism of renal origin: Secondary | ICD-10-CM | POA: Diagnosis not present

## 2019-03-01 DIAGNOSIS — D631 Anemia in chronic kidney disease: Secondary | ICD-10-CM | POA: Diagnosis not present

## 2019-03-01 DIAGNOSIS — Z992 Dependence on renal dialysis: Secondary | ICD-10-CM | POA: Diagnosis not present

## 2019-03-01 DIAGNOSIS — N186 End stage renal disease: Secondary | ICD-10-CM | POA: Diagnosis not present

## 2019-03-01 DIAGNOSIS — E875 Hyperkalemia: Secondary | ICD-10-CM | POA: Diagnosis not present

## 2019-03-01 DIAGNOSIS — L600 Ingrowing nail: Secondary | ICD-10-CM | POA: Diagnosis not present

## 2019-03-01 NOTE — Patient Instructions (Signed)

## 2019-03-02 NOTE — Progress Notes (Signed)
Subjective: 48 year old male presents the office today with his friend for concerns of left big toenail pain.  States the nails are ingrown on both corners noticeable nail removed on both corners.  Area is painful with pressure in shoes.  He has not had any drainage or pus. Denies any systemic complaints such as fevers, chills, nausea, vomiting. No acute changes since last appointment, and no other complaints at this time.   Objective: AAO x3, NAD DP/PT pulses palpable bilaterally, CRT less than 3 seconds The toenails in general hypertrophic, dystrophic particularly left hallux toenail is dystrophic with yellow-brown discoloration.  There is incurvation present both medial lateral borders of the left hallux toenail with tenderness palpation both corners.  There is no edema, erythema, drainage or pus or any clinical signs of infection.   No open lesions or pre-ulcerative lesions.  No pain with calf compression, swelling, warmth, erythema  Assessment: Left hallux symptomatic ingrown toenail  Plan: -All treatment options discussed with the patient including all alternatives, risks, complications.  -At this time, the patient is requesting partial nail removal with chemical matricectomy to the symptomatic portion of the nail. Risks and complications were discussed with the patient for which they understand and written consent was obtained. Under sterile conditions a total of 3 mL of a mixture of 2% lidocaine plain and 0.5% Marcaine plain was infiltrated in a hallux block fashion. Once anesthetized, the skin was prepped in sterile fashion. A tourniquet was then applied. Next the medial/lateral aspect of hallux nail border was then sharply excised making sure to remove the entire offending nail border. Once the nails were ensured to be removed area was debrided and the underlying skin was intact. There is no purulence identified in the procedure. Next phenol was then applied under standard conditions and  copiously irrigated. Silvadene was applied. A dry sterile dressing was applied. After application of the dressing the tourniquet was removed and there is found to be an immediate capillary refill time to the digit. The patient tolerated the procedure well any complications. Post procedure instructions were discussed the patient for which he verbally understood. Follow-up in one week for nail check or sooner if any problems are to arise. Discussed signs/symptoms of infection and directed to call the office immediately should any occur or go directly to the emergency room. In the meantime, encouraged to call the office with any questions, concerns, changes symptoms. -Patient encouraged to call the office with any questions, concerns, change in symptoms.

## 2019-03-03 DIAGNOSIS — E875 Hyperkalemia: Secondary | ICD-10-CM | POA: Diagnosis not present

## 2019-03-03 DIAGNOSIS — D631 Anemia in chronic kidney disease: Secondary | ICD-10-CM | POA: Diagnosis not present

## 2019-03-03 DIAGNOSIS — N2581 Secondary hyperparathyroidism of renal origin: Secondary | ICD-10-CM | POA: Diagnosis not present

## 2019-03-03 DIAGNOSIS — N186 End stage renal disease: Secondary | ICD-10-CM | POA: Diagnosis not present

## 2019-03-03 DIAGNOSIS — Z992 Dependence on renal dialysis: Secondary | ICD-10-CM | POA: Diagnosis not present

## 2019-03-05 DIAGNOSIS — Z992 Dependence on renal dialysis: Secondary | ICD-10-CM | POA: Diagnosis not present

## 2019-03-05 DIAGNOSIS — N2581 Secondary hyperparathyroidism of renal origin: Secondary | ICD-10-CM | POA: Diagnosis not present

## 2019-03-05 DIAGNOSIS — I129 Hypertensive chronic kidney disease with stage 1 through stage 4 chronic kidney disease, or unspecified chronic kidney disease: Secondary | ICD-10-CM | POA: Diagnosis not present

## 2019-03-05 DIAGNOSIS — N186 End stage renal disease: Secondary | ICD-10-CM | POA: Diagnosis not present

## 2019-03-05 DIAGNOSIS — D631 Anemia in chronic kidney disease: Secondary | ICD-10-CM | POA: Diagnosis not present

## 2019-03-05 DIAGNOSIS — E875 Hyperkalemia: Secondary | ICD-10-CM | POA: Diagnosis not present

## 2019-03-08 DIAGNOSIS — Z992 Dependence on renal dialysis: Secondary | ICD-10-CM | POA: Diagnosis not present

## 2019-03-08 DIAGNOSIS — D631 Anemia in chronic kidney disease: Secondary | ICD-10-CM | POA: Diagnosis not present

## 2019-03-08 DIAGNOSIS — N186 End stage renal disease: Secondary | ICD-10-CM | POA: Diagnosis not present

## 2019-03-08 DIAGNOSIS — N2581 Secondary hyperparathyroidism of renal origin: Secondary | ICD-10-CM | POA: Diagnosis not present

## 2019-03-08 DIAGNOSIS — E875 Hyperkalemia: Secondary | ICD-10-CM | POA: Diagnosis not present

## 2019-03-10 DIAGNOSIS — E875 Hyperkalemia: Secondary | ICD-10-CM | POA: Diagnosis not present

## 2019-03-10 DIAGNOSIS — Z992 Dependence on renal dialysis: Secondary | ICD-10-CM | POA: Diagnosis not present

## 2019-03-10 DIAGNOSIS — N186 End stage renal disease: Secondary | ICD-10-CM | POA: Diagnosis not present

## 2019-03-10 DIAGNOSIS — N2581 Secondary hyperparathyroidism of renal origin: Secondary | ICD-10-CM | POA: Diagnosis not present

## 2019-03-10 DIAGNOSIS — D631 Anemia in chronic kidney disease: Secondary | ICD-10-CM | POA: Diagnosis not present

## 2019-03-12 DIAGNOSIS — D631 Anemia in chronic kidney disease: Secondary | ICD-10-CM | POA: Diagnosis not present

## 2019-03-12 DIAGNOSIS — N2581 Secondary hyperparathyroidism of renal origin: Secondary | ICD-10-CM | POA: Diagnosis not present

## 2019-03-12 DIAGNOSIS — N186 End stage renal disease: Secondary | ICD-10-CM | POA: Diagnosis not present

## 2019-03-12 DIAGNOSIS — Z992 Dependence on renal dialysis: Secondary | ICD-10-CM | POA: Diagnosis not present

## 2019-03-12 DIAGNOSIS — E875 Hyperkalemia: Secondary | ICD-10-CM | POA: Diagnosis not present

## 2019-03-13 ENCOUNTER — Other Ambulatory Visit: Payer: Self-pay | Admitting: Physician Assistant

## 2019-03-13 DIAGNOSIS — F419 Anxiety disorder, unspecified: Secondary | ICD-10-CM

## 2019-03-15 DIAGNOSIS — N186 End stage renal disease: Secondary | ICD-10-CM | POA: Diagnosis not present

## 2019-03-15 DIAGNOSIS — Z992 Dependence on renal dialysis: Secondary | ICD-10-CM | POA: Diagnosis not present

## 2019-03-15 DIAGNOSIS — E875 Hyperkalemia: Secondary | ICD-10-CM | POA: Diagnosis not present

## 2019-03-15 DIAGNOSIS — N2581 Secondary hyperparathyroidism of renal origin: Secondary | ICD-10-CM | POA: Diagnosis not present

## 2019-03-15 DIAGNOSIS — D631 Anemia in chronic kidney disease: Secondary | ICD-10-CM | POA: Diagnosis not present

## 2019-03-19 DIAGNOSIS — E875 Hyperkalemia: Secondary | ICD-10-CM | POA: Diagnosis not present

## 2019-03-19 DIAGNOSIS — N2581 Secondary hyperparathyroidism of renal origin: Secondary | ICD-10-CM | POA: Diagnosis not present

## 2019-03-19 DIAGNOSIS — Z992 Dependence on renal dialysis: Secondary | ICD-10-CM | POA: Diagnosis not present

## 2019-03-19 DIAGNOSIS — D631 Anemia in chronic kidney disease: Secondary | ICD-10-CM | POA: Diagnosis not present

## 2019-03-19 DIAGNOSIS — N186 End stage renal disease: Secondary | ICD-10-CM | POA: Diagnosis not present

## 2019-03-22 DIAGNOSIS — Z992 Dependence on renal dialysis: Secondary | ICD-10-CM | POA: Diagnosis not present

## 2019-03-22 DIAGNOSIS — N2581 Secondary hyperparathyroidism of renal origin: Secondary | ICD-10-CM | POA: Diagnosis not present

## 2019-03-22 DIAGNOSIS — D631 Anemia in chronic kidney disease: Secondary | ICD-10-CM | POA: Diagnosis not present

## 2019-03-22 DIAGNOSIS — N186 End stage renal disease: Secondary | ICD-10-CM | POA: Diagnosis not present

## 2019-03-22 DIAGNOSIS — E875 Hyperkalemia: Secondary | ICD-10-CM | POA: Diagnosis not present

## 2019-03-24 DIAGNOSIS — N2581 Secondary hyperparathyroidism of renal origin: Secondary | ICD-10-CM | POA: Diagnosis not present

## 2019-03-24 DIAGNOSIS — E875 Hyperkalemia: Secondary | ICD-10-CM | POA: Diagnosis not present

## 2019-03-24 DIAGNOSIS — D631 Anemia in chronic kidney disease: Secondary | ICD-10-CM | POA: Diagnosis not present

## 2019-03-24 DIAGNOSIS — Z992 Dependence on renal dialysis: Secondary | ICD-10-CM | POA: Diagnosis not present

## 2019-03-24 DIAGNOSIS — N186 End stage renal disease: Secondary | ICD-10-CM | POA: Diagnosis not present

## 2019-03-26 DIAGNOSIS — D631 Anemia in chronic kidney disease: Secondary | ICD-10-CM | POA: Diagnosis not present

## 2019-03-26 DIAGNOSIS — N2581 Secondary hyperparathyroidism of renal origin: Secondary | ICD-10-CM | POA: Diagnosis not present

## 2019-03-26 DIAGNOSIS — N186 End stage renal disease: Secondary | ICD-10-CM | POA: Diagnosis not present

## 2019-03-26 DIAGNOSIS — E875 Hyperkalemia: Secondary | ICD-10-CM | POA: Diagnosis not present

## 2019-03-26 DIAGNOSIS — Z992 Dependence on renal dialysis: Secondary | ICD-10-CM | POA: Diagnosis not present

## 2019-03-29 DIAGNOSIS — E875 Hyperkalemia: Secondary | ICD-10-CM | POA: Diagnosis not present

## 2019-03-29 DIAGNOSIS — N186 End stage renal disease: Secondary | ICD-10-CM | POA: Diagnosis not present

## 2019-03-29 DIAGNOSIS — D631 Anemia in chronic kidney disease: Secondary | ICD-10-CM | POA: Diagnosis not present

## 2019-03-29 DIAGNOSIS — Z992 Dependence on renal dialysis: Secondary | ICD-10-CM | POA: Diagnosis not present

## 2019-03-29 DIAGNOSIS — N2581 Secondary hyperparathyroidism of renal origin: Secondary | ICD-10-CM | POA: Diagnosis not present

## 2019-03-31 DIAGNOSIS — N2581 Secondary hyperparathyroidism of renal origin: Secondary | ICD-10-CM | POA: Diagnosis not present

## 2019-03-31 DIAGNOSIS — Z992 Dependence on renal dialysis: Secondary | ICD-10-CM | POA: Diagnosis not present

## 2019-03-31 DIAGNOSIS — E875 Hyperkalemia: Secondary | ICD-10-CM | POA: Diagnosis not present

## 2019-03-31 DIAGNOSIS — N186 End stage renal disease: Secondary | ICD-10-CM | POA: Diagnosis not present

## 2019-03-31 DIAGNOSIS — D631 Anemia in chronic kidney disease: Secondary | ICD-10-CM | POA: Diagnosis not present

## 2019-04-01 ENCOUNTER — Ambulatory Visit: Payer: Medicare Other | Admitting: Family Medicine

## 2019-04-02 DIAGNOSIS — D631 Anemia in chronic kidney disease: Secondary | ICD-10-CM | POA: Diagnosis not present

## 2019-04-02 DIAGNOSIS — E875 Hyperkalemia: Secondary | ICD-10-CM | POA: Diagnosis not present

## 2019-04-02 DIAGNOSIS — Z992 Dependence on renal dialysis: Secondary | ICD-10-CM | POA: Diagnosis not present

## 2019-04-02 DIAGNOSIS — N2581 Secondary hyperparathyroidism of renal origin: Secondary | ICD-10-CM | POA: Diagnosis not present

## 2019-04-02 DIAGNOSIS — N186 End stage renal disease: Secondary | ICD-10-CM | POA: Diagnosis not present

## 2019-04-05 DIAGNOSIS — I129 Hypertensive chronic kidney disease with stage 1 through stage 4 chronic kidney disease, or unspecified chronic kidney disease: Secondary | ICD-10-CM | POA: Diagnosis not present

## 2019-04-05 DIAGNOSIS — N2581 Secondary hyperparathyroidism of renal origin: Secondary | ICD-10-CM | POA: Diagnosis not present

## 2019-04-05 DIAGNOSIS — N186 End stage renal disease: Secondary | ICD-10-CM | POA: Diagnosis not present

## 2019-04-05 DIAGNOSIS — D631 Anemia in chronic kidney disease: Secondary | ICD-10-CM | POA: Diagnosis not present

## 2019-04-05 DIAGNOSIS — Z992 Dependence on renal dialysis: Secondary | ICD-10-CM | POA: Diagnosis not present

## 2019-04-05 DIAGNOSIS — E875 Hyperkalemia: Secondary | ICD-10-CM | POA: Diagnosis not present

## 2019-04-07 DIAGNOSIS — E875 Hyperkalemia: Secondary | ICD-10-CM | POA: Diagnosis not present

## 2019-04-07 DIAGNOSIS — N2581 Secondary hyperparathyroidism of renal origin: Secondary | ICD-10-CM | POA: Diagnosis not present

## 2019-04-07 DIAGNOSIS — D631 Anemia in chronic kidney disease: Secondary | ICD-10-CM | POA: Diagnosis not present

## 2019-04-07 DIAGNOSIS — N186 End stage renal disease: Secondary | ICD-10-CM | POA: Diagnosis not present

## 2019-04-07 DIAGNOSIS — Z992 Dependence on renal dialysis: Secondary | ICD-10-CM | POA: Diagnosis not present

## 2019-04-09 DIAGNOSIS — N2581 Secondary hyperparathyroidism of renal origin: Secondary | ICD-10-CM | POA: Diagnosis not present

## 2019-04-09 DIAGNOSIS — D631 Anemia in chronic kidney disease: Secondary | ICD-10-CM | POA: Diagnosis not present

## 2019-04-09 DIAGNOSIS — Z992 Dependence on renal dialysis: Secondary | ICD-10-CM | POA: Diagnosis not present

## 2019-04-09 DIAGNOSIS — N186 End stage renal disease: Secondary | ICD-10-CM | POA: Diagnosis not present

## 2019-04-09 DIAGNOSIS — E875 Hyperkalemia: Secondary | ICD-10-CM | POA: Diagnosis not present

## 2019-04-12 DIAGNOSIS — Z992 Dependence on renal dialysis: Secondary | ICD-10-CM | POA: Diagnosis not present

## 2019-04-12 DIAGNOSIS — E875 Hyperkalemia: Secondary | ICD-10-CM | POA: Diagnosis not present

## 2019-04-12 DIAGNOSIS — D631 Anemia in chronic kidney disease: Secondary | ICD-10-CM | POA: Diagnosis not present

## 2019-04-12 DIAGNOSIS — N2581 Secondary hyperparathyroidism of renal origin: Secondary | ICD-10-CM | POA: Diagnosis not present

## 2019-04-12 DIAGNOSIS — N186 End stage renal disease: Secondary | ICD-10-CM | POA: Diagnosis not present

## 2019-04-16 DIAGNOSIS — E875 Hyperkalemia: Secondary | ICD-10-CM | POA: Diagnosis not present

## 2019-04-16 DIAGNOSIS — D631 Anemia in chronic kidney disease: Secondary | ICD-10-CM | POA: Diagnosis not present

## 2019-04-16 DIAGNOSIS — Z992 Dependence on renal dialysis: Secondary | ICD-10-CM | POA: Diagnosis not present

## 2019-04-16 DIAGNOSIS — N2581 Secondary hyperparathyroidism of renal origin: Secondary | ICD-10-CM | POA: Diagnosis not present

## 2019-04-16 DIAGNOSIS — N186 End stage renal disease: Secondary | ICD-10-CM | POA: Diagnosis not present

## 2019-04-19 DIAGNOSIS — N2581 Secondary hyperparathyroidism of renal origin: Secondary | ICD-10-CM | POA: Diagnosis not present

## 2019-04-19 DIAGNOSIS — E875 Hyperkalemia: Secondary | ICD-10-CM | POA: Diagnosis not present

## 2019-04-19 DIAGNOSIS — D631 Anemia in chronic kidney disease: Secondary | ICD-10-CM | POA: Diagnosis not present

## 2019-04-19 DIAGNOSIS — N186 End stage renal disease: Secondary | ICD-10-CM | POA: Diagnosis not present

## 2019-04-19 DIAGNOSIS — Z992 Dependence on renal dialysis: Secondary | ICD-10-CM | POA: Diagnosis not present

## 2019-04-20 ENCOUNTER — Ambulatory Visit (INDEPENDENT_AMBULATORY_CARE_PROVIDER_SITE_OTHER): Payer: Medicare Other | Admitting: Neurology

## 2019-04-20 ENCOUNTER — Other Ambulatory Visit: Payer: Self-pay

## 2019-04-20 ENCOUNTER — Encounter: Payer: Self-pay | Admitting: Neurology

## 2019-04-20 DIAGNOSIS — G43809 Other migraine, not intractable, without status migrainosus: Secondary | ICD-10-CM | POA: Diagnosis not present

## 2019-04-20 NOTE — Progress Notes (Signed)
Virtual Visit via Video  I connected with Marvin Roberson on 04/20/19 at  By phone and verified that I am speaking with the correct person using two identifiers.   I discussed the limitations, risks, security and privacy concerns of performing an evaluation and management service by phone and the availability of in person appointments. I also discussed with the patient that there may be a patient responsible charge related to this service. The patient expressed understanding and agreed to proceed.   History of Present Illness: Marvin Roberson is a 48 year old male, with history of hypertension, end-stage renal disease, on hemodialysis for more than 20 years, is referred by his nephrologist PA Anice Paganini for evaluation of  frequent headaches, initial evaluation was on January 28, 2019 via phone interview.  He has poorly controlled hypertension for many years, has been on hemodialysis for more than 20 years, Monday Wednesday Friday, also smoke half pack a day, denies drinks, lives alone, able to drive to hemodialysis  He reported more than 10 years history of intermittent headaches, it happens worse, during his headache days, it can happen back-to-back lasting few hours to days, holoacranial retro-orbital area headaches with associated light, noise sensitivity, mild nausea, lying down in dark quiet room was helpful, Tylenol as needed was helpful  He denies visual change, lateralized motor or sensory deficit with his headaches.  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  The proximal portion of the left interosseous and ulnar  arteries are patent.  Observations/Objective: I have reviewed problem lists, medications, allergies.  Awake, alert, oriented to history taking and casual conversation, move four extremity without difficulty, steady gait.  Assessment and Plan: Chronic headaches  With migraine features  Suboptimal response to over-the-counter Tylenol  I have called in Fioricet 1 tablet as needed, 10 tablets for 1 month  He will call clinic for worsening headaches or any focal signs,    Follow Up Instructions:    as needed.   I discussed the assessment and treatment plan with the patient. The patient was provided an opportunity to ask questions and all were answered. The patient agreed with the plan and demonstrated an understanding of the instructions.   The patient was advised to call back or seek an in-person evaluation if the symptoms worsen or if the condition fails to improve as anticipated.  I provided 15 minutes of non-face-to-face time during this encounter.   Marcial Pacas, MD

## 2019-04-21 DIAGNOSIS — N186 End stage renal disease: Secondary | ICD-10-CM | POA: Diagnosis not present

## 2019-04-21 DIAGNOSIS — D631 Anemia in chronic kidney disease: Secondary | ICD-10-CM | POA: Diagnosis not present

## 2019-04-21 DIAGNOSIS — E875 Hyperkalemia: Secondary | ICD-10-CM | POA: Diagnosis not present

## 2019-04-21 DIAGNOSIS — N2581 Secondary hyperparathyroidism of renal origin: Secondary | ICD-10-CM | POA: Diagnosis not present

## 2019-04-21 DIAGNOSIS — Z992 Dependence on renal dialysis: Secondary | ICD-10-CM | POA: Diagnosis not present

## 2019-04-22 DIAGNOSIS — D631 Anemia in chronic kidney disease: Secondary | ICD-10-CM | POA: Diagnosis not present

## 2019-04-22 DIAGNOSIS — N186 End stage renal disease: Secondary | ICD-10-CM | POA: Diagnosis not present

## 2019-04-22 DIAGNOSIS — E875 Hyperkalemia: Secondary | ICD-10-CM | POA: Diagnosis not present

## 2019-04-22 DIAGNOSIS — Z992 Dependence on renal dialysis: Secondary | ICD-10-CM | POA: Diagnosis not present

## 2019-04-22 DIAGNOSIS — N2581 Secondary hyperparathyroidism of renal origin: Secondary | ICD-10-CM | POA: Diagnosis not present

## 2019-04-26 DIAGNOSIS — E875 Hyperkalemia: Secondary | ICD-10-CM | POA: Diagnosis not present

## 2019-04-26 DIAGNOSIS — D631 Anemia in chronic kidney disease: Secondary | ICD-10-CM | POA: Diagnosis not present

## 2019-04-26 DIAGNOSIS — Z992 Dependence on renal dialysis: Secondary | ICD-10-CM | POA: Diagnosis not present

## 2019-04-26 DIAGNOSIS — N2581 Secondary hyperparathyroidism of renal origin: Secondary | ICD-10-CM | POA: Diagnosis not present

## 2019-04-26 DIAGNOSIS — N186 End stage renal disease: Secondary | ICD-10-CM | POA: Diagnosis not present

## 2019-04-28 DIAGNOSIS — D631 Anemia in chronic kidney disease: Secondary | ICD-10-CM | POA: Diagnosis not present

## 2019-04-28 DIAGNOSIS — Z992 Dependence on renal dialysis: Secondary | ICD-10-CM | POA: Diagnosis not present

## 2019-04-28 DIAGNOSIS — N2581 Secondary hyperparathyroidism of renal origin: Secondary | ICD-10-CM | POA: Diagnosis not present

## 2019-04-28 DIAGNOSIS — N186 End stage renal disease: Secondary | ICD-10-CM | POA: Diagnosis not present

## 2019-04-28 DIAGNOSIS — E875 Hyperkalemia: Secondary | ICD-10-CM | POA: Diagnosis not present

## 2019-04-30 DIAGNOSIS — E875 Hyperkalemia: Secondary | ICD-10-CM | POA: Diagnosis not present

## 2019-04-30 DIAGNOSIS — Z992 Dependence on renal dialysis: Secondary | ICD-10-CM | POA: Diagnosis not present

## 2019-04-30 DIAGNOSIS — D631 Anemia in chronic kidney disease: Secondary | ICD-10-CM | POA: Diagnosis not present

## 2019-04-30 DIAGNOSIS — N186 End stage renal disease: Secondary | ICD-10-CM | POA: Diagnosis not present

## 2019-04-30 DIAGNOSIS — N2581 Secondary hyperparathyroidism of renal origin: Secondary | ICD-10-CM | POA: Diagnosis not present

## 2019-05-03 DIAGNOSIS — E875 Hyperkalemia: Secondary | ICD-10-CM | POA: Diagnosis not present

## 2019-05-03 DIAGNOSIS — N2581 Secondary hyperparathyroidism of renal origin: Secondary | ICD-10-CM | POA: Diagnosis not present

## 2019-05-03 DIAGNOSIS — Z992 Dependence on renal dialysis: Secondary | ICD-10-CM | POA: Diagnosis not present

## 2019-05-03 DIAGNOSIS — D631 Anemia in chronic kidney disease: Secondary | ICD-10-CM | POA: Diagnosis not present

## 2019-05-03 DIAGNOSIS — N186 End stage renal disease: Secondary | ICD-10-CM | POA: Diagnosis not present

## 2019-05-04 ENCOUNTER — Ambulatory Visit: Payer: Medicare Other | Admitting: Neurology

## 2019-05-05 DIAGNOSIS — E875 Hyperkalemia: Secondary | ICD-10-CM | POA: Diagnosis not present

## 2019-05-05 DIAGNOSIS — R739 Hyperglycemia, unspecified: Secondary | ICD-10-CM | POA: Diagnosis not present

## 2019-05-05 DIAGNOSIS — I129 Hypertensive chronic kidney disease with stage 1 through stage 4 chronic kidney disease, or unspecified chronic kidney disease: Secondary | ICD-10-CM | POA: Diagnosis not present

## 2019-05-05 DIAGNOSIS — D509 Iron deficiency anemia, unspecified: Secondary | ICD-10-CM | POA: Diagnosis not present

## 2019-05-05 DIAGNOSIS — Z992 Dependence on renal dialysis: Secondary | ICD-10-CM | POA: Diagnosis not present

## 2019-05-05 DIAGNOSIS — N2581 Secondary hyperparathyroidism of renal origin: Secondary | ICD-10-CM | POA: Diagnosis not present

## 2019-05-05 DIAGNOSIS — N186 End stage renal disease: Secondary | ICD-10-CM | POA: Diagnosis not present

## 2019-05-05 DIAGNOSIS — D631 Anemia in chronic kidney disease: Secondary | ICD-10-CM | POA: Diagnosis not present

## 2019-05-07 DIAGNOSIS — N2581 Secondary hyperparathyroidism of renal origin: Secondary | ICD-10-CM | POA: Diagnosis not present

## 2019-05-07 DIAGNOSIS — N186 End stage renal disease: Secondary | ICD-10-CM | POA: Diagnosis not present

## 2019-05-07 DIAGNOSIS — R739 Hyperglycemia, unspecified: Secondary | ICD-10-CM | POA: Diagnosis not present

## 2019-05-07 DIAGNOSIS — E875 Hyperkalemia: Secondary | ICD-10-CM | POA: Diagnosis not present

## 2019-05-07 DIAGNOSIS — Z992 Dependence on renal dialysis: Secondary | ICD-10-CM | POA: Diagnosis not present

## 2019-05-07 DIAGNOSIS — D631 Anemia in chronic kidney disease: Secondary | ICD-10-CM | POA: Diagnosis not present

## 2019-05-10 DIAGNOSIS — E875 Hyperkalemia: Secondary | ICD-10-CM | POA: Diagnosis not present

## 2019-05-10 DIAGNOSIS — N2581 Secondary hyperparathyroidism of renal origin: Secondary | ICD-10-CM | POA: Diagnosis not present

## 2019-05-10 DIAGNOSIS — Z992 Dependence on renal dialysis: Secondary | ICD-10-CM | POA: Diagnosis not present

## 2019-05-10 DIAGNOSIS — R739 Hyperglycemia, unspecified: Secondary | ICD-10-CM | POA: Diagnosis not present

## 2019-05-10 DIAGNOSIS — N186 End stage renal disease: Secondary | ICD-10-CM | POA: Diagnosis not present

## 2019-05-10 DIAGNOSIS — D631 Anemia in chronic kidney disease: Secondary | ICD-10-CM | POA: Diagnosis not present

## 2019-05-14 DIAGNOSIS — N186 End stage renal disease: Secondary | ICD-10-CM | POA: Diagnosis not present

## 2019-05-14 DIAGNOSIS — E875 Hyperkalemia: Secondary | ICD-10-CM | POA: Diagnosis not present

## 2019-05-14 DIAGNOSIS — R739 Hyperglycemia, unspecified: Secondary | ICD-10-CM | POA: Diagnosis not present

## 2019-05-14 DIAGNOSIS — D631 Anemia in chronic kidney disease: Secondary | ICD-10-CM | POA: Diagnosis not present

## 2019-05-14 DIAGNOSIS — Z992 Dependence on renal dialysis: Secondary | ICD-10-CM | POA: Diagnosis not present

## 2019-05-14 DIAGNOSIS — N2581 Secondary hyperparathyroidism of renal origin: Secondary | ICD-10-CM | POA: Diagnosis not present

## 2019-05-17 DIAGNOSIS — D631 Anemia in chronic kidney disease: Secondary | ICD-10-CM | POA: Diagnosis not present

## 2019-05-17 DIAGNOSIS — Z992 Dependence on renal dialysis: Secondary | ICD-10-CM | POA: Diagnosis not present

## 2019-05-17 DIAGNOSIS — R739 Hyperglycemia, unspecified: Secondary | ICD-10-CM | POA: Diagnosis not present

## 2019-05-17 DIAGNOSIS — N2581 Secondary hyperparathyroidism of renal origin: Secondary | ICD-10-CM | POA: Diagnosis not present

## 2019-05-17 DIAGNOSIS — N186 End stage renal disease: Secondary | ICD-10-CM | POA: Diagnosis not present

## 2019-05-17 DIAGNOSIS — E875 Hyperkalemia: Secondary | ICD-10-CM | POA: Diagnosis not present

## 2019-05-19 DIAGNOSIS — N2581 Secondary hyperparathyroidism of renal origin: Secondary | ICD-10-CM | POA: Diagnosis not present

## 2019-05-19 DIAGNOSIS — R739 Hyperglycemia, unspecified: Secondary | ICD-10-CM | POA: Diagnosis not present

## 2019-05-19 DIAGNOSIS — E875 Hyperkalemia: Secondary | ICD-10-CM | POA: Diagnosis not present

## 2019-05-19 DIAGNOSIS — D631 Anemia in chronic kidney disease: Secondary | ICD-10-CM | POA: Diagnosis not present

## 2019-05-19 DIAGNOSIS — Z992 Dependence on renal dialysis: Secondary | ICD-10-CM | POA: Diagnosis not present

## 2019-05-19 DIAGNOSIS — N186 End stage renal disease: Secondary | ICD-10-CM | POA: Diagnosis not present

## 2019-05-21 DIAGNOSIS — D631 Anemia in chronic kidney disease: Secondary | ICD-10-CM | POA: Diagnosis not present

## 2019-05-21 DIAGNOSIS — N2581 Secondary hyperparathyroidism of renal origin: Secondary | ICD-10-CM | POA: Diagnosis not present

## 2019-05-21 DIAGNOSIS — E875 Hyperkalemia: Secondary | ICD-10-CM | POA: Diagnosis not present

## 2019-05-21 DIAGNOSIS — Z992 Dependence on renal dialysis: Secondary | ICD-10-CM | POA: Diagnosis not present

## 2019-05-21 DIAGNOSIS — N186 End stage renal disease: Secondary | ICD-10-CM | POA: Diagnosis not present

## 2019-05-21 DIAGNOSIS — R739 Hyperglycemia, unspecified: Secondary | ICD-10-CM | POA: Diagnosis not present

## 2019-05-24 DIAGNOSIS — Z992 Dependence on renal dialysis: Secondary | ICD-10-CM | POA: Diagnosis not present

## 2019-05-24 DIAGNOSIS — N186 End stage renal disease: Secondary | ICD-10-CM | POA: Diagnosis not present

## 2019-05-24 DIAGNOSIS — R300 Dysuria: Secondary | ICD-10-CM | POA: Insufficient documentation

## 2019-05-24 DIAGNOSIS — N2581 Secondary hyperparathyroidism of renal origin: Secondary | ICD-10-CM | POA: Diagnosis not present

## 2019-05-24 DIAGNOSIS — E875 Hyperkalemia: Secondary | ICD-10-CM | POA: Diagnosis not present

## 2019-05-24 DIAGNOSIS — D631 Anemia in chronic kidney disease: Secondary | ICD-10-CM | POA: Diagnosis not present

## 2019-05-24 DIAGNOSIS — R739 Hyperglycemia, unspecified: Secondary | ICD-10-CM | POA: Diagnosis not present

## 2019-05-26 DIAGNOSIS — E875 Hyperkalemia: Secondary | ICD-10-CM | POA: Diagnosis not present

## 2019-05-26 DIAGNOSIS — N2581 Secondary hyperparathyroidism of renal origin: Secondary | ICD-10-CM | POA: Diagnosis not present

## 2019-05-26 DIAGNOSIS — D631 Anemia in chronic kidney disease: Secondary | ICD-10-CM | POA: Diagnosis not present

## 2019-05-26 DIAGNOSIS — Z992 Dependence on renal dialysis: Secondary | ICD-10-CM | POA: Diagnosis not present

## 2019-05-26 DIAGNOSIS — N186 End stage renal disease: Secondary | ICD-10-CM | POA: Diagnosis not present

## 2019-05-26 DIAGNOSIS — R739 Hyperglycemia, unspecified: Secondary | ICD-10-CM | POA: Diagnosis not present

## 2019-05-28 DIAGNOSIS — R300 Dysuria: Secondary | ICD-10-CM | POA: Diagnosis not present

## 2019-05-28 DIAGNOSIS — R739 Hyperglycemia, unspecified: Secondary | ICD-10-CM | POA: Diagnosis not present

## 2019-05-28 DIAGNOSIS — Z992 Dependence on renal dialysis: Secondary | ICD-10-CM | POA: Diagnosis not present

## 2019-05-28 DIAGNOSIS — E875 Hyperkalemia: Secondary | ICD-10-CM | POA: Diagnosis not present

## 2019-05-28 DIAGNOSIS — D631 Anemia in chronic kidney disease: Secondary | ICD-10-CM | POA: Diagnosis not present

## 2019-05-28 DIAGNOSIS — N2581 Secondary hyperparathyroidism of renal origin: Secondary | ICD-10-CM | POA: Diagnosis not present

## 2019-05-28 DIAGNOSIS — N186 End stage renal disease: Secondary | ICD-10-CM | POA: Diagnosis not present

## 2019-05-31 DIAGNOSIS — N2581 Secondary hyperparathyroidism of renal origin: Secondary | ICD-10-CM | POA: Diagnosis not present

## 2019-05-31 DIAGNOSIS — R739 Hyperglycemia, unspecified: Secondary | ICD-10-CM | POA: Diagnosis not present

## 2019-05-31 DIAGNOSIS — D631 Anemia in chronic kidney disease: Secondary | ICD-10-CM | POA: Diagnosis not present

## 2019-05-31 DIAGNOSIS — Z992 Dependence on renal dialysis: Secondary | ICD-10-CM | POA: Diagnosis not present

## 2019-05-31 DIAGNOSIS — N186 End stage renal disease: Secondary | ICD-10-CM | POA: Diagnosis not present

## 2019-05-31 DIAGNOSIS — E875 Hyperkalemia: Secondary | ICD-10-CM | POA: Diagnosis not present

## 2019-06-02 DIAGNOSIS — N186 End stage renal disease: Secondary | ICD-10-CM | POA: Diagnosis not present

## 2019-06-02 DIAGNOSIS — N2581 Secondary hyperparathyroidism of renal origin: Secondary | ICD-10-CM | POA: Diagnosis not present

## 2019-06-02 DIAGNOSIS — E875 Hyperkalemia: Secondary | ICD-10-CM | POA: Diagnosis not present

## 2019-06-02 DIAGNOSIS — R739 Hyperglycemia, unspecified: Secondary | ICD-10-CM | POA: Diagnosis not present

## 2019-06-02 DIAGNOSIS — Z992 Dependence on renal dialysis: Secondary | ICD-10-CM | POA: Diagnosis not present

## 2019-06-02 DIAGNOSIS — D631 Anemia in chronic kidney disease: Secondary | ICD-10-CM | POA: Diagnosis not present

## 2019-06-04 DIAGNOSIS — N2581 Secondary hyperparathyroidism of renal origin: Secondary | ICD-10-CM | POA: Diagnosis not present

## 2019-06-04 DIAGNOSIS — R300 Dysuria: Secondary | ICD-10-CM | POA: Diagnosis not present

## 2019-06-04 DIAGNOSIS — Z992 Dependence on renal dialysis: Secondary | ICD-10-CM | POA: Diagnosis not present

## 2019-06-04 DIAGNOSIS — E875 Hyperkalemia: Secondary | ICD-10-CM | POA: Diagnosis not present

## 2019-06-04 DIAGNOSIS — D631 Anemia in chronic kidney disease: Secondary | ICD-10-CM | POA: Diagnosis not present

## 2019-06-04 DIAGNOSIS — N186 End stage renal disease: Secondary | ICD-10-CM | POA: Diagnosis not present

## 2019-06-04 DIAGNOSIS — R739 Hyperglycemia, unspecified: Secondary | ICD-10-CM | POA: Diagnosis not present

## 2019-06-05 DIAGNOSIS — Z992 Dependence on renal dialysis: Secondary | ICD-10-CM | POA: Diagnosis not present

## 2019-06-05 DIAGNOSIS — I129 Hypertensive chronic kidney disease with stage 1 through stage 4 chronic kidney disease, or unspecified chronic kidney disease: Secondary | ICD-10-CM | POA: Diagnosis not present

## 2019-06-05 DIAGNOSIS — N186 End stage renal disease: Secondary | ICD-10-CM | POA: Diagnosis not present

## 2019-06-07 DIAGNOSIS — N186 End stage renal disease: Secondary | ICD-10-CM | POA: Diagnosis not present

## 2019-06-07 DIAGNOSIS — E875 Hyperkalemia: Secondary | ICD-10-CM | POA: Diagnosis not present

## 2019-06-07 DIAGNOSIS — D631 Anemia in chronic kidney disease: Secondary | ICD-10-CM | POA: Diagnosis not present

## 2019-06-07 DIAGNOSIS — N2581 Secondary hyperparathyroidism of renal origin: Secondary | ICD-10-CM | POA: Diagnosis not present

## 2019-06-07 DIAGNOSIS — Z992 Dependence on renal dialysis: Secondary | ICD-10-CM | POA: Diagnosis not present

## 2019-06-09 DIAGNOSIS — D631 Anemia in chronic kidney disease: Secondary | ICD-10-CM | POA: Diagnosis not present

## 2019-06-09 DIAGNOSIS — N2581 Secondary hyperparathyroidism of renal origin: Secondary | ICD-10-CM | POA: Diagnosis not present

## 2019-06-09 DIAGNOSIS — E875 Hyperkalemia: Secondary | ICD-10-CM | POA: Diagnosis not present

## 2019-06-09 DIAGNOSIS — Z992 Dependence on renal dialysis: Secondary | ICD-10-CM | POA: Diagnosis not present

## 2019-06-09 DIAGNOSIS — N186 End stage renal disease: Secondary | ICD-10-CM | POA: Diagnosis not present

## 2019-06-11 DIAGNOSIS — E875 Hyperkalemia: Secondary | ICD-10-CM | POA: Diagnosis not present

## 2019-06-11 DIAGNOSIS — N2581 Secondary hyperparathyroidism of renal origin: Secondary | ICD-10-CM | POA: Diagnosis not present

## 2019-06-11 DIAGNOSIS — D631 Anemia in chronic kidney disease: Secondary | ICD-10-CM | POA: Diagnosis not present

## 2019-06-11 DIAGNOSIS — Z992 Dependence on renal dialysis: Secondary | ICD-10-CM | POA: Diagnosis not present

## 2019-06-11 DIAGNOSIS — N186 End stage renal disease: Secondary | ICD-10-CM | POA: Diagnosis not present

## 2019-06-14 DIAGNOSIS — N2581 Secondary hyperparathyroidism of renal origin: Secondary | ICD-10-CM | POA: Diagnosis not present

## 2019-06-14 DIAGNOSIS — N186 End stage renal disease: Secondary | ICD-10-CM | POA: Diagnosis not present

## 2019-06-14 DIAGNOSIS — Z992 Dependence on renal dialysis: Secondary | ICD-10-CM | POA: Diagnosis not present

## 2019-06-14 DIAGNOSIS — E875 Hyperkalemia: Secondary | ICD-10-CM | POA: Diagnosis not present

## 2019-06-14 DIAGNOSIS — D631 Anemia in chronic kidney disease: Secondary | ICD-10-CM | POA: Diagnosis not present

## 2019-06-16 DIAGNOSIS — D631 Anemia in chronic kidney disease: Secondary | ICD-10-CM | POA: Diagnosis not present

## 2019-06-16 DIAGNOSIS — N2581 Secondary hyperparathyroidism of renal origin: Secondary | ICD-10-CM | POA: Diagnosis not present

## 2019-06-16 DIAGNOSIS — Z992 Dependence on renal dialysis: Secondary | ICD-10-CM | POA: Diagnosis not present

## 2019-06-16 DIAGNOSIS — E875 Hyperkalemia: Secondary | ICD-10-CM | POA: Diagnosis not present

## 2019-06-16 DIAGNOSIS — N186 End stage renal disease: Secondary | ICD-10-CM | POA: Diagnosis not present

## 2019-06-18 DIAGNOSIS — E875 Hyperkalemia: Secondary | ICD-10-CM | POA: Diagnosis not present

## 2019-06-18 DIAGNOSIS — N186 End stage renal disease: Secondary | ICD-10-CM | POA: Diagnosis not present

## 2019-06-18 DIAGNOSIS — N2581 Secondary hyperparathyroidism of renal origin: Secondary | ICD-10-CM | POA: Diagnosis not present

## 2019-06-18 DIAGNOSIS — Z992 Dependence on renal dialysis: Secondary | ICD-10-CM | POA: Diagnosis not present

## 2019-06-18 DIAGNOSIS — D631 Anemia in chronic kidney disease: Secondary | ICD-10-CM | POA: Diagnosis not present

## 2019-06-21 DIAGNOSIS — N2581 Secondary hyperparathyroidism of renal origin: Secondary | ICD-10-CM | POA: Diagnosis not present

## 2019-06-21 DIAGNOSIS — Z992 Dependence on renal dialysis: Secondary | ICD-10-CM | POA: Diagnosis not present

## 2019-06-21 DIAGNOSIS — E875 Hyperkalemia: Secondary | ICD-10-CM | POA: Diagnosis not present

## 2019-06-21 DIAGNOSIS — N186 End stage renal disease: Secondary | ICD-10-CM | POA: Diagnosis not present

## 2019-06-21 DIAGNOSIS — D631 Anemia in chronic kidney disease: Secondary | ICD-10-CM | POA: Diagnosis not present

## 2019-06-23 DIAGNOSIS — N2581 Secondary hyperparathyroidism of renal origin: Secondary | ICD-10-CM | POA: Diagnosis not present

## 2019-06-23 DIAGNOSIS — N186 End stage renal disease: Secondary | ICD-10-CM | POA: Diagnosis not present

## 2019-06-23 DIAGNOSIS — Z992 Dependence on renal dialysis: Secondary | ICD-10-CM | POA: Diagnosis not present

## 2019-06-23 DIAGNOSIS — D631 Anemia in chronic kidney disease: Secondary | ICD-10-CM | POA: Diagnosis not present

## 2019-06-23 DIAGNOSIS — E875 Hyperkalemia: Secondary | ICD-10-CM | POA: Diagnosis not present

## 2019-06-24 ENCOUNTER — Ambulatory Visit: Payer: Medicare Other | Admitting: Internal Medicine

## 2019-06-25 DIAGNOSIS — D631 Anemia in chronic kidney disease: Secondary | ICD-10-CM | POA: Diagnosis not present

## 2019-06-25 DIAGNOSIS — Z992 Dependence on renal dialysis: Secondary | ICD-10-CM | POA: Diagnosis not present

## 2019-06-25 DIAGNOSIS — N2581 Secondary hyperparathyroidism of renal origin: Secondary | ICD-10-CM | POA: Diagnosis not present

## 2019-06-25 DIAGNOSIS — N186 End stage renal disease: Secondary | ICD-10-CM | POA: Diagnosis not present

## 2019-06-25 DIAGNOSIS — E875 Hyperkalemia: Secondary | ICD-10-CM | POA: Diagnosis not present

## 2019-06-30 DIAGNOSIS — D631 Anemia in chronic kidney disease: Secondary | ICD-10-CM | POA: Diagnosis not present

## 2019-06-30 DIAGNOSIS — N2581 Secondary hyperparathyroidism of renal origin: Secondary | ICD-10-CM | POA: Diagnosis not present

## 2019-06-30 DIAGNOSIS — N186 End stage renal disease: Secondary | ICD-10-CM | POA: Diagnosis not present

## 2019-06-30 DIAGNOSIS — E875 Hyperkalemia: Secondary | ICD-10-CM | POA: Diagnosis not present

## 2019-06-30 DIAGNOSIS — Z992 Dependence on renal dialysis: Secondary | ICD-10-CM | POA: Diagnosis not present

## 2019-07-02 DIAGNOSIS — N186 End stage renal disease: Secondary | ICD-10-CM | POA: Diagnosis not present

## 2019-07-02 DIAGNOSIS — N2581 Secondary hyperparathyroidism of renal origin: Secondary | ICD-10-CM | POA: Diagnosis not present

## 2019-07-02 DIAGNOSIS — D631 Anemia in chronic kidney disease: Secondary | ICD-10-CM | POA: Diagnosis not present

## 2019-07-02 DIAGNOSIS — Z992 Dependence on renal dialysis: Secondary | ICD-10-CM | POA: Diagnosis not present

## 2019-07-02 DIAGNOSIS — E875 Hyperkalemia: Secondary | ICD-10-CM | POA: Diagnosis not present

## 2019-07-05 DIAGNOSIS — E875 Hyperkalemia: Secondary | ICD-10-CM | POA: Diagnosis not present

## 2019-07-05 DIAGNOSIS — Z992 Dependence on renal dialysis: Secondary | ICD-10-CM | POA: Diagnosis not present

## 2019-07-05 DIAGNOSIS — D631 Anemia in chronic kidney disease: Secondary | ICD-10-CM | POA: Diagnosis not present

## 2019-07-05 DIAGNOSIS — N2581 Secondary hyperparathyroidism of renal origin: Secondary | ICD-10-CM | POA: Diagnosis not present

## 2019-07-05 DIAGNOSIS — N186 End stage renal disease: Secondary | ICD-10-CM | POA: Diagnosis not present

## 2019-07-06 DIAGNOSIS — I129 Hypertensive chronic kidney disease with stage 1 through stage 4 chronic kidney disease, or unspecified chronic kidney disease: Secondary | ICD-10-CM | POA: Diagnosis not present

## 2019-07-06 DIAGNOSIS — Z992 Dependence on renal dialysis: Secondary | ICD-10-CM | POA: Diagnosis not present

## 2019-07-06 DIAGNOSIS — N186 End stage renal disease: Secondary | ICD-10-CM | POA: Diagnosis not present

## 2019-07-07 DIAGNOSIS — N186 End stage renal disease: Secondary | ICD-10-CM | POA: Diagnosis not present

## 2019-07-07 DIAGNOSIS — D631 Anemia in chronic kidney disease: Secondary | ICD-10-CM | POA: Diagnosis not present

## 2019-07-07 DIAGNOSIS — Z992 Dependence on renal dialysis: Secondary | ICD-10-CM | POA: Diagnosis not present

## 2019-07-07 DIAGNOSIS — D509 Iron deficiency anemia, unspecified: Secondary | ICD-10-CM | POA: Diagnosis not present

## 2019-07-07 DIAGNOSIS — E875 Hyperkalemia: Secondary | ICD-10-CM | POA: Diagnosis not present

## 2019-07-07 DIAGNOSIS — N2581 Secondary hyperparathyroidism of renal origin: Secondary | ICD-10-CM | POA: Diagnosis not present

## 2019-07-09 DIAGNOSIS — N186 End stage renal disease: Secondary | ICD-10-CM | POA: Diagnosis not present

## 2019-07-09 DIAGNOSIS — D509 Iron deficiency anemia, unspecified: Secondary | ICD-10-CM | POA: Diagnosis not present

## 2019-07-09 DIAGNOSIS — Z992 Dependence on renal dialysis: Secondary | ICD-10-CM | POA: Diagnosis not present

## 2019-07-09 DIAGNOSIS — N2581 Secondary hyperparathyroidism of renal origin: Secondary | ICD-10-CM | POA: Diagnosis not present

## 2019-07-09 DIAGNOSIS — D631 Anemia in chronic kidney disease: Secondary | ICD-10-CM | POA: Diagnosis not present

## 2019-07-09 DIAGNOSIS — E875 Hyperkalemia: Secondary | ICD-10-CM | POA: Diagnosis not present

## 2019-07-12 DIAGNOSIS — D631 Anemia in chronic kidney disease: Secondary | ICD-10-CM | POA: Diagnosis not present

## 2019-07-12 DIAGNOSIS — N2581 Secondary hyperparathyroidism of renal origin: Secondary | ICD-10-CM | POA: Diagnosis not present

## 2019-07-12 DIAGNOSIS — E875 Hyperkalemia: Secondary | ICD-10-CM | POA: Diagnosis not present

## 2019-07-12 DIAGNOSIS — N186 End stage renal disease: Secondary | ICD-10-CM | POA: Diagnosis not present

## 2019-07-12 DIAGNOSIS — D509 Iron deficiency anemia, unspecified: Secondary | ICD-10-CM | POA: Diagnosis not present

## 2019-07-12 DIAGNOSIS — Z992 Dependence on renal dialysis: Secondary | ICD-10-CM | POA: Diagnosis not present

## 2019-07-14 DIAGNOSIS — N186 End stage renal disease: Secondary | ICD-10-CM | POA: Diagnosis not present

## 2019-07-14 DIAGNOSIS — E875 Hyperkalemia: Secondary | ICD-10-CM | POA: Diagnosis not present

## 2019-07-14 DIAGNOSIS — D631 Anemia in chronic kidney disease: Secondary | ICD-10-CM | POA: Diagnosis not present

## 2019-07-14 DIAGNOSIS — N2581 Secondary hyperparathyroidism of renal origin: Secondary | ICD-10-CM | POA: Diagnosis not present

## 2019-07-14 DIAGNOSIS — D509 Iron deficiency anemia, unspecified: Secondary | ICD-10-CM | POA: Diagnosis not present

## 2019-07-14 DIAGNOSIS — Z992 Dependence on renal dialysis: Secondary | ICD-10-CM | POA: Diagnosis not present

## 2019-07-15 DIAGNOSIS — Z992 Dependence on renal dialysis: Secondary | ICD-10-CM | POA: Diagnosis not present

## 2019-07-15 DIAGNOSIS — D509 Iron deficiency anemia, unspecified: Secondary | ICD-10-CM | POA: Diagnosis not present

## 2019-07-15 DIAGNOSIS — D631 Anemia in chronic kidney disease: Secondary | ICD-10-CM | POA: Diagnosis not present

## 2019-07-15 DIAGNOSIS — N2581 Secondary hyperparathyroidism of renal origin: Secondary | ICD-10-CM | POA: Diagnosis not present

## 2019-07-15 DIAGNOSIS — E875 Hyperkalemia: Secondary | ICD-10-CM | POA: Diagnosis not present

## 2019-07-15 DIAGNOSIS — N186 End stage renal disease: Secondary | ICD-10-CM | POA: Diagnosis not present

## 2019-07-19 DIAGNOSIS — D631 Anemia in chronic kidney disease: Secondary | ICD-10-CM | POA: Diagnosis not present

## 2019-07-19 DIAGNOSIS — Z992 Dependence on renal dialysis: Secondary | ICD-10-CM | POA: Diagnosis not present

## 2019-07-19 DIAGNOSIS — N186 End stage renal disease: Secondary | ICD-10-CM | POA: Diagnosis not present

## 2019-07-19 DIAGNOSIS — N2581 Secondary hyperparathyroidism of renal origin: Secondary | ICD-10-CM | POA: Diagnosis not present

## 2019-07-19 DIAGNOSIS — D509 Iron deficiency anemia, unspecified: Secondary | ICD-10-CM | POA: Diagnosis not present

## 2019-07-19 DIAGNOSIS — E875 Hyperkalemia: Secondary | ICD-10-CM | POA: Diagnosis not present

## 2019-07-21 DIAGNOSIS — D509 Iron deficiency anemia, unspecified: Secondary | ICD-10-CM | POA: Diagnosis not present

## 2019-07-21 DIAGNOSIS — D631 Anemia in chronic kidney disease: Secondary | ICD-10-CM | POA: Diagnosis not present

## 2019-07-21 DIAGNOSIS — N2581 Secondary hyperparathyroidism of renal origin: Secondary | ICD-10-CM | POA: Diagnosis not present

## 2019-07-21 DIAGNOSIS — N186 End stage renal disease: Secondary | ICD-10-CM | POA: Diagnosis not present

## 2019-07-21 DIAGNOSIS — Z992 Dependence on renal dialysis: Secondary | ICD-10-CM | POA: Diagnosis not present

## 2019-07-21 DIAGNOSIS — E875 Hyperkalemia: Secondary | ICD-10-CM | POA: Diagnosis not present

## 2019-07-23 DIAGNOSIS — E875 Hyperkalemia: Secondary | ICD-10-CM | POA: Diagnosis not present

## 2019-07-23 DIAGNOSIS — Z992 Dependence on renal dialysis: Secondary | ICD-10-CM | POA: Diagnosis not present

## 2019-07-23 DIAGNOSIS — D631 Anemia in chronic kidney disease: Secondary | ICD-10-CM | POA: Diagnosis not present

## 2019-07-23 DIAGNOSIS — N186 End stage renal disease: Secondary | ICD-10-CM | POA: Diagnosis not present

## 2019-07-23 DIAGNOSIS — N2581 Secondary hyperparathyroidism of renal origin: Secondary | ICD-10-CM | POA: Diagnosis not present

## 2019-07-23 DIAGNOSIS — D509 Iron deficiency anemia, unspecified: Secondary | ICD-10-CM | POA: Diagnosis not present

## 2019-07-26 DIAGNOSIS — N2581 Secondary hyperparathyroidism of renal origin: Secondary | ICD-10-CM | POA: Diagnosis not present

## 2019-07-26 DIAGNOSIS — N186 End stage renal disease: Secondary | ICD-10-CM | POA: Diagnosis not present

## 2019-07-26 DIAGNOSIS — Z992 Dependence on renal dialysis: Secondary | ICD-10-CM | POA: Diagnosis not present

## 2019-07-26 DIAGNOSIS — E875 Hyperkalemia: Secondary | ICD-10-CM | POA: Diagnosis not present

## 2019-07-26 DIAGNOSIS — D509 Iron deficiency anemia, unspecified: Secondary | ICD-10-CM | POA: Diagnosis not present

## 2019-07-26 DIAGNOSIS — D631 Anemia in chronic kidney disease: Secondary | ICD-10-CM | POA: Diagnosis not present

## 2019-07-28 DIAGNOSIS — N186 End stage renal disease: Secondary | ICD-10-CM | POA: Diagnosis not present

## 2019-07-28 DIAGNOSIS — N2581 Secondary hyperparathyroidism of renal origin: Secondary | ICD-10-CM | POA: Diagnosis not present

## 2019-07-28 DIAGNOSIS — D631 Anemia in chronic kidney disease: Secondary | ICD-10-CM | POA: Diagnosis not present

## 2019-07-28 DIAGNOSIS — Z992 Dependence on renal dialysis: Secondary | ICD-10-CM | POA: Diagnosis not present

## 2019-07-28 DIAGNOSIS — D509 Iron deficiency anemia, unspecified: Secondary | ICD-10-CM | POA: Diagnosis not present

## 2019-07-28 DIAGNOSIS — E875 Hyperkalemia: Secondary | ICD-10-CM | POA: Diagnosis not present

## 2019-07-30 DIAGNOSIS — N186 End stage renal disease: Secondary | ICD-10-CM | POA: Diagnosis not present

## 2019-07-30 DIAGNOSIS — D509 Iron deficiency anemia, unspecified: Secondary | ICD-10-CM | POA: Diagnosis not present

## 2019-07-30 DIAGNOSIS — D631 Anemia in chronic kidney disease: Secondary | ICD-10-CM | POA: Diagnosis not present

## 2019-07-30 DIAGNOSIS — Z992 Dependence on renal dialysis: Secondary | ICD-10-CM | POA: Diagnosis not present

## 2019-07-30 DIAGNOSIS — N2581 Secondary hyperparathyroidism of renal origin: Secondary | ICD-10-CM | POA: Diagnosis not present

## 2019-07-30 DIAGNOSIS — E875 Hyperkalemia: Secondary | ICD-10-CM | POA: Diagnosis not present

## 2019-08-02 DIAGNOSIS — D631 Anemia in chronic kidney disease: Secondary | ICD-10-CM | POA: Diagnosis not present

## 2019-08-02 DIAGNOSIS — N186 End stage renal disease: Secondary | ICD-10-CM | POA: Diagnosis not present

## 2019-08-02 DIAGNOSIS — Z992 Dependence on renal dialysis: Secondary | ICD-10-CM | POA: Diagnosis not present

## 2019-08-02 DIAGNOSIS — E875 Hyperkalemia: Secondary | ICD-10-CM | POA: Diagnosis not present

## 2019-08-02 DIAGNOSIS — D509 Iron deficiency anemia, unspecified: Secondary | ICD-10-CM | POA: Diagnosis not present

## 2019-08-02 DIAGNOSIS — N2581 Secondary hyperparathyroidism of renal origin: Secondary | ICD-10-CM | POA: Diagnosis not present

## 2019-08-05 DIAGNOSIS — Z992 Dependence on renal dialysis: Secondary | ICD-10-CM | POA: Diagnosis not present

## 2019-08-05 DIAGNOSIS — N186 End stage renal disease: Secondary | ICD-10-CM | POA: Diagnosis not present

## 2019-08-05 DIAGNOSIS — I129 Hypertensive chronic kidney disease with stage 1 through stage 4 chronic kidney disease, or unspecified chronic kidney disease: Secondary | ICD-10-CM | POA: Diagnosis not present

## 2019-08-06 DIAGNOSIS — N2581 Secondary hyperparathyroidism of renal origin: Secondary | ICD-10-CM | POA: Diagnosis not present

## 2019-08-06 DIAGNOSIS — D631 Anemia in chronic kidney disease: Secondary | ICD-10-CM | POA: Diagnosis not present

## 2019-08-06 DIAGNOSIS — Z992 Dependence on renal dialysis: Secondary | ICD-10-CM | POA: Diagnosis not present

## 2019-08-06 DIAGNOSIS — D509 Iron deficiency anemia, unspecified: Secondary | ICD-10-CM | POA: Diagnosis not present

## 2019-08-06 DIAGNOSIS — E875 Hyperkalemia: Secondary | ICD-10-CM | POA: Diagnosis not present

## 2019-08-06 DIAGNOSIS — N186 End stage renal disease: Secondary | ICD-10-CM | POA: Diagnosis not present

## 2019-08-09 DIAGNOSIS — D509 Iron deficiency anemia, unspecified: Secondary | ICD-10-CM | POA: Diagnosis not present

## 2019-08-09 DIAGNOSIS — N186 End stage renal disease: Secondary | ICD-10-CM | POA: Diagnosis not present

## 2019-08-09 DIAGNOSIS — E875 Hyperkalemia: Secondary | ICD-10-CM | POA: Diagnosis not present

## 2019-08-09 DIAGNOSIS — N2581 Secondary hyperparathyroidism of renal origin: Secondary | ICD-10-CM | POA: Diagnosis not present

## 2019-08-09 DIAGNOSIS — D631 Anemia in chronic kidney disease: Secondary | ICD-10-CM | POA: Diagnosis not present

## 2019-08-09 DIAGNOSIS — Z992 Dependence on renal dialysis: Secondary | ICD-10-CM | POA: Diagnosis not present

## 2019-08-11 DIAGNOSIS — E875 Hyperkalemia: Secondary | ICD-10-CM | POA: Diagnosis not present

## 2019-08-11 DIAGNOSIS — D631 Anemia in chronic kidney disease: Secondary | ICD-10-CM | POA: Diagnosis not present

## 2019-08-11 DIAGNOSIS — N2581 Secondary hyperparathyroidism of renal origin: Secondary | ICD-10-CM | POA: Diagnosis not present

## 2019-08-11 DIAGNOSIS — D509 Iron deficiency anemia, unspecified: Secondary | ICD-10-CM | POA: Diagnosis not present

## 2019-08-11 DIAGNOSIS — N186 End stage renal disease: Secondary | ICD-10-CM | POA: Diagnosis not present

## 2019-08-11 DIAGNOSIS — Z992 Dependence on renal dialysis: Secondary | ICD-10-CM | POA: Diagnosis not present

## 2019-08-13 DIAGNOSIS — Z992 Dependence on renal dialysis: Secondary | ICD-10-CM | POA: Diagnosis not present

## 2019-08-13 DIAGNOSIS — N2581 Secondary hyperparathyroidism of renal origin: Secondary | ICD-10-CM | POA: Diagnosis not present

## 2019-08-13 DIAGNOSIS — D631 Anemia in chronic kidney disease: Secondary | ICD-10-CM | POA: Diagnosis not present

## 2019-08-13 DIAGNOSIS — D509 Iron deficiency anemia, unspecified: Secondary | ICD-10-CM | POA: Diagnosis not present

## 2019-08-13 DIAGNOSIS — N186 End stage renal disease: Secondary | ICD-10-CM | POA: Diagnosis not present

## 2019-08-13 DIAGNOSIS — E875 Hyperkalemia: Secondary | ICD-10-CM | POA: Diagnosis not present

## 2019-08-16 DIAGNOSIS — D509 Iron deficiency anemia, unspecified: Secondary | ICD-10-CM | POA: Diagnosis not present

## 2019-08-16 DIAGNOSIS — N2581 Secondary hyperparathyroidism of renal origin: Secondary | ICD-10-CM | POA: Diagnosis not present

## 2019-08-16 DIAGNOSIS — D631 Anemia in chronic kidney disease: Secondary | ICD-10-CM | POA: Diagnosis not present

## 2019-08-16 DIAGNOSIS — E875 Hyperkalemia: Secondary | ICD-10-CM | POA: Diagnosis not present

## 2019-08-16 DIAGNOSIS — N186 End stage renal disease: Secondary | ICD-10-CM | POA: Diagnosis not present

## 2019-08-16 DIAGNOSIS — Z992 Dependence on renal dialysis: Secondary | ICD-10-CM | POA: Diagnosis not present

## 2019-08-18 DIAGNOSIS — N2581 Secondary hyperparathyroidism of renal origin: Secondary | ICD-10-CM | POA: Diagnosis not present

## 2019-08-18 DIAGNOSIS — Z992 Dependence on renal dialysis: Secondary | ICD-10-CM | POA: Diagnosis not present

## 2019-08-18 DIAGNOSIS — E875 Hyperkalemia: Secondary | ICD-10-CM | POA: Diagnosis not present

## 2019-08-18 DIAGNOSIS — D631 Anemia in chronic kidney disease: Secondary | ICD-10-CM | POA: Diagnosis not present

## 2019-08-18 DIAGNOSIS — D509 Iron deficiency anemia, unspecified: Secondary | ICD-10-CM | POA: Diagnosis not present

## 2019-08-18 DIAGNOSIS — N186 End stage renal disease: Secondary | ICD-10-CM | POA: Diagnosis not present

## 2019-08-20 DIAGNOSIS — Z992 Dependence on renal dialysis: Secondary | ICD-10-CM | POA: Diagnosis not present

## 2019-08-20 DIAGNOSIS — N186 End stage renal disease: Secondary | ICD-10-CM | POA: Diagnosis not present

## 2019-08-20 DIAGNOSIS — D509 Iron deficiency anemia, unspecified: Secondary | ICD-10-CM | POA: Diagnosis not present

## 2019-08-20 DIAGNOSIS — E875 Hyperkalemia: Secondary | ICD-10-CM | POA: Diagnosis not present

## 2019-08-20 DIAGNOSIS — D631 Anemia in chronic kidney disease: Secondary | ICD-10-CM | POA: Diagnosis not present

## 2019-08-20 DIAGNOSIS — N2581 Secondary hyperparathyroidism of renal origin: Secondary | ICD-10-CM | POA: Diagnosis not present

## 2019-08-23 DIAGNOSIS — D631 Anemia in chronic kidney disease: Secondary | ICD-10-CM | POA: Diagnosis not present

## 2019-08-23 DIAGNOSIS — N2581 Secondary hyperparathyroidism of renal origin: Secondary | ICD-10-CM | POA: Diagnosis not present

## 2019-08-23 DIAGNOSIS — E875 Hyperkalemia: Secondary | ICD-10-CM | POA: Diagnosis not present

## 2019-08-23 DIAGNOSIS — D509 Iron deficiency anemia, unspecified: Secondary | ICD-10-CM | POA: Diagnosis not present

## 2019-08-23 DIAGNOSIS — N186 End stage renal disease: Secondary | ICD-10-CM | POA: Diagnosis not present

## 2019-08-23 DIAGNOSIS — Z992 Dependence on renal dialysis: Secondary | ICD-10-CM | POA: Diagnosis not present

## 2019-08-27 DIAGNOSIS — D631 Anemia in chronic kidney disease: Secondary | ICD-10-CM | POA: Diagnosis not present

## 2019-08-27 DIAGNOSIS — N186 End stage renal disease: Secondary | ICD-10-CM | POA: Diagnosis not present

## 2019-08-27 DIAGNOSIS — E875 Hyperkalemia: Secondary | ICD-10-CM | POA: Diagnosis not present

## 2019-08-27 DIAGNOSIS — N2581 Secondary hyperparathyroidism of renal origin: Secondary | ICD-10-CM | POA: Diagnosis not present

## 2019-08-27 DIAGNOSIS — Z992 Dependence on renal dialysis: Secondary | ICD-10-CM | POA: Diagnosis not present

## 2019-08-27 DIAGNOSIS — D509 Iron deficiency anemia, unspecified: Secondary | ICD-10-CM | POA: Diagnosis not present

## 2019-08-30 DIAGNOSIS — D631 Anemia in chronic kidney disease: Secondary | ICD-10-CM | POA: Diagnosis not present

## 2019-08-30 DIAGNOSIS — N2581 Secondary hyperparathyroidism of renal origin: Secondary | ICD-10-CM | POA: Diagnosis not present

## 2019-08-30 DIAGNOSIS — E875 Hyperkalemia: Secondary | ICD-10-CM | POA: Diagnosis not present

## 2019-08-30 DIAGNOSIS — D509 Iron deficiency anemia, unspecified: Secondary | ICD-10-CM | POA: Diagnosis not present

## 2019-08-30 DIAGNOSIS — N186 End stage renal disease: Secondary | ICD-10-CM | POA: Diagnosis not present

## 2019-08-30 DIAGNOSIS — Z992 Dependence on renal dialysis: Secondary | ICD-10-CM | POA: Diagnosis not present

## 2019-09-01 DIAGNOSIS — N186 End stage renal disease: Secondary | ICD-10-CM | POA: Diagnosis not present

## 2019-09-01 DIAGNOSIS — D509 Iron deficiency anemia, unspecified: Secondary | ICD-10-CM | POA: Diagnosis not present

## 2019-09-01 DIAGNOSIS — Z992 Dependence on renal dialysis: Secondary | ICD-10-CM | POA: Diagnosis not present

## 2019-09-01 DIAGNOSIS — E875 Hyperkalemia: Secondary | ICD-10-CM | POA: Diagnosis not present

## 2019-09-01 DIAGNOSIS — N2581 Secondary hyperparathyroidism of renal origin: Secondary | ICD-10-CM | POA: Diagnosis not present

## 2019-09-01 DIAGNOSIS — D631 Anemia in chronic kidney disease: Secondary | ICD-10-CM | POA: Diagnosis not present

## 2019-09-03 DIAGNOSIS — D631 Anemia in chronic kidney disease: Secondary | ICD-10-CM | POA: Diagnosis not present

## 2019-09-03 DIAGNOSIS — D509 Iron deficiency anemia, unspecified: Secondary | ICD-10-CM | POA: Diagnosis not present

## 2019-09-03 DIAGNOSIS — E875 Hyperkalemia: Secondary | ICD-10-CM | POA: Diagnosis not present

## 2019-09-03 DIAGNOSIS — N2581 Secondary hyperparathyroidism of renal origin: Secondary | ICD-10-CM | POA: Diagnosis not present

## 2019-09-03 DIAGNOSIS — Z992 Dependence on renal dialysis: Secondary | ICD-10-CM | POA: Diagnosis not present

## 2019-09-03 DIAGNOSIS — N186 End stage renal disease: Secondary | ICD-10-CM | POA: Diagnosis not present

## 2019-09-05 DIAGNOSIS — Z992 Dependence on renal dialysis: Secondary | ICD-10-CM | POA: Diagnosis not present

## 2019-09-05 DIAGNOSIS — I129 Hypertensive chronic kidney disease with stage 1 through stage 4 chronic kidney disease, or unspecified chronic kidney disease: Secondary | ICD-10-CM | POA: Diagnosis not present

## 2019-09-05 DIAGNOSIS — N186 End stage renal disease: Secondary | ICD-10-CM | POA: Diagnosis not present

## 2019-09-06 DIAGNOSIS — N2581 Secondary hyperparathyroidism of renal origin: Secondary | ICD-10-CM | POA: Diagnosis not present

## 2019-09-06 DIAGNOSIS — D509 Iron deficiency anemia, unspecified: Secondary | ICD-10-CM | POA: Diagnosis not present

## 2019-09-06 DIAGNOSIS — D631 Anemia in chronic kidney disease: Secondary | ICD-10-CM | POA: Diagnosis not present

## 2019-09-06 DIAGNOSIS — N186 End stage renal disease: Secondary | ICD-10-CM | POA: Diagnosis not present

## 2019-09-06 DIAGNOSIS — E875 Hyperkalemia: Secondary | ICD-10-CM | POA: Diagnosis not present

## 2019-09-06 DIAGNOSIS — Z992 Dependence on renal dialysis: Secondary | ICD-10-CM | POA: Diagnosis not present

## 2019-09-08 DIAGNOSIS — D631 Anemia in chronic kidney disease: Secondary | ICD-10-CM | POA: Diagnosis not present

## 2019-09-08 DIAGNOSIS — N186 End stage renal disease: Secondary | ICD-10-CM | POA: Diagnosis not present

## 2019-09-08 DIAGNOSIS — E875 Hyperkalemia: Secondary | ICD-10-CM | POA: Diagnosis not present

## 2019-09-08 DIAGNOSIS — N2581 Secondary hyperparathyroidism of renal origin: Secondary | ICD-10-CM | POA: Diagnosis not present

## 2019-09-08 DIAGNOSIS — Z992 Dependence on renal dialysis: Secondary | ICD-10-CM | POA: Diagnosis not present

## 2019-09-08 DIAGNOSIS — D509 Iron deficiency anemia, unspecified: Secondary | ICD-10-CM | POA: Diagnosis not present

## 2019-09-10 DIAGNOSIS — D509 Iron deficiency anemia, unspecified: Secondary | ICD-10-CM | POA: Diagnosis not present

## 2019-09-10 DIAGNOSIS — Z992 Dependence on renal dialysis: Secondary | ICD-10-CM | POA: Diagnosis not present

## 2019-09-10 DIAGNOSIS — E875 Hyperkalemia: Secondary | ICD-10-CM | POA: Diagnosis not present

## 2019-09-10 DIAGNOSIS — N186 End stage renal disease: Secondary | ICD-10-CM | POA: Diagnosis not present

## 2019-09-10 DIAGNOSIS — D631 Anemia in chronic kidney disease: Secondary | ICD-10-CM | POA: Diagnosis not present

## 2019-09-10 DIAGNOSIS — N2581 Secondary hyperparathyroidism of renal origin: Secondary | ICD-10-CM | POA: Diagnosis not present

## 2019-09-13 DIAGNOSIS — Z992 Dependence on renal dialysis: Secondary | ICD-10-CM | POA: Diagnosis not present

## 2019-09-13 DIAGNOSIS — D631 Anemia in chronic kidney disease: Secondary | ICD-10-CM | POA: Diagnosis not present

## 2019-09-13 DIAGNOSIS — E875 Hyperkalemia: Secondary | ICD-10-CM | POA: Diagnosis not present

## 2019-09-13 DIAGNOSIS — N186 End stage renal disease: Secondary | ICD-10-CM | POA: Diagnosis not present

## 2019-09-13 DIAGNOSIS — D509 Iron deficiency anemia, unspecified: Secondary | ICD-10-CM | POA: Diagnosis not present

## 2019-09-13 DIAGNOSIS — N2581 Secondary hyperparathyroidism of renal origin: Secondary | ICD-10-CM | POA: Diagnosis not present

## 2019-09-17 DIAGNOSIS — N186 End stage renal disease: Secondary | ICD-10-CM | POA: Diagnosis not present

## 2019-09-17 DIAGNOSIS — D631 Anemia in chronic kidney disease: Secondary | ICD-10-CM | POA: Diagnosis not present

## 2019-09-17 DIAGNOSIS — Z992 Dependence on renal dialysis: Secondary | ICD-10-CM | POA: Diagnosis not present

## 2019-09-17 DIAGNOSIS — D509 Iron deficiency anemia, unspecified: Secondary | ICD-10-CM | POA: Diagnosis not present

## 2019-09-17 DIAGNOSIS — N2581 Secondary hyperparathyroidism of renal origin: Secondary | ICD-10-CM | POA: Diagnosis not present

## 2019-09-17 DIAGNOSIS — E875 Hyperkalemia: Secondary | ICD-10-CM | POA: Diagnosis not present

## 2019-09-20 DIAGNOSIS — N186 End stage renal disease: Secondary | ICD-10-CM | POA: Diagnosis not present

## 2019-09-20 DIAGNOSIS — D631 Anemia in chronic kidney disease: Secondary | ICD-10-CM | POA: Diagnosis not present

## 2019-09-20 DIAGNOSIS — E875 Hyperkalemia: Secondary | ICD-10-CM | POA: Diagnosis not present

## 2019-09-20 DIAGNOSIS — D509 Iron deficiency anemia, unspecified: Secondary | ICD-10-CM | POA: Diagnosis not present

## 2019-09-20 DIAGNOSIS — N2581 Secondary hyperparathyroidism of renal origin: Secondary | ICD-10-CM | POA: Diagnosis not present

## 2019-09-20 DIAGNOSIS — Z992 Dependence on renal dialysis: Secondary | ICD-10-CM | POA: Diagnosis not present

## 2019-09-22 DIAGNOSIS — N186 End stage renal disease: Secondary | ICD-10-CM | POA: Diagnosis not present

## 2019-09-22 DIAGNOSIS — D509 Iron deficiency anemia, unspecified: Secondary | ICD-10-CM | POA: Diagnosis not present

## 2019-09-22 DIAGNOSIS — E875 Hyperkalemia: Secondary | ICD-10-CM | POA: Diagnosis not present

## 2019-09-22 DIAGNOSIS — N2581 Secondary hyperparathyroidism of renal origin: Secondary | ICD-10-CM | POA: Diagnosis not present

## 2019-09-22 DIAGNOSIS — Z992 Dependence on renal dialysis: Secondary | ICD-10-CM | POA: Diagnosis not present

## 2019-09-22 DIAGNOSIS — D631 Anemia in chronic kidney disease: Secondary | ICD-10-CM | POA: Diagnosis not present

## 2019-09-24 DIAGNOSIS — D631 Anemia in chronic kidney disease: Secondary | ICD-10-CM | POA: Diagnosis not present

## 2019-09-24 DIAGNOSIS — E875 Hyperkalemia: Secondary | ICD-10-CM | POA: Diagnosis not present

## 2019-09-24 DIAGNOSIS — N2581 Secondary hyperparathyroidism of renal origin: Secondary | ICD-10-CM | POA: Diagnosis not present

## 2019-09-24 DIAGNOSIS — D509 Iron deficiency anemia, unspecified: Secondary | ICD-10-CM | POA: Diagnosis not present

## 2019-09-24 DIAGNOSIS — N186 End stage renal disease: Secondary | ICD-10-CM | POA: Diagnosis not present

## 2019-09-24 DIAGNOSIS — Z992 Dependence on renal dialysis: Secondary | ICD-10-CM | POA: Diagnosis not present

## 2019-09-28 DIAGNOSIS — N2581 Secondary hyperparathyroidism of renal origin: Secondary | ICD-10-CM | POA: Diagnosis not present

## 2019-09-28 DIAGNOSIS — Z992 Dependence on renal dialysis: Secondary | ICD-10-CM | POA: Diagnosis not present

## 2019-09-28 DIAGNOSIS — E875 Hyperkalemia: Secondary | ICD-10-CM | POA: Diagnosis not present

## 2019-09-28 DIAGNOSIS — D631 Anemia in chronic kidney disease: Secondary | ICD-10-CM | POA: Diagnosis not present

## 2019-09-28 DIAGNOSIS — N186 End stage renal disease: Secondary | ICD-10-CM | POA: Diagnosis not present

## 2019-09-28 DIAGNOSIS — D509 Iron deficiency anemia, unspecified: Secondary | ICD-10-CM | POA: Diagnosis not present

## 2019-10-01 DIAGNOSIS — N2581 Secondary hyperparathyroidism of renal origin: Secondary | ICD-10-CM | POA: Diagnosis not present

## 2019-10-01 DIAGNOSIS — D631 Anemia in chronic kidney disease: Secondary | ICD-10-CM | POA: Diagnosis not present

## 2019-10-01 DIAGNOSIS — E875 Hyperkalemia: Secondary | ICD-10-CM | POA: Diagnosis not present

## 2019-10-01 DIAGNOSIS — N186 End stage renal disease: Secondary | ICD-10-CM | POA: Diagnosis not present

## 2019-10-01 DIAGNOSIS — Z992 Dependence on renal dialysis: Secondary | ICD-10-CM | POA: Diagnosis not present

## 2019-10-01 DIAGNOSIS — D509 Iron deficiency anemia, unspecified: Secondary | ICD-10-CM | POA: Diagnosis not present

## 2019-10-04 DIAGNOSIS — D631 Anemia in chronic kidney disease: Secondary | ICD-10-CM | POA: Diagnosis not present

## 2019-10-04 DIAGNOSIS — Z992 Dependence on renal dialysis: Secondary | ICD-10-CM | POA: Diagnosis not present

## 2019-10-04 DIAGNOSIS — N186 End stage renal disease: Secondary | ICD-10-CM | POA: Diagnosis not present

## 2019-10-04 DIAGNOSIS — D509 Iron deficiency anemia, unspecified: Secondary | ICD-10-CM | POA: Diagnosis not present

## 2019-10-04 DIAGNOSIS — N2581 Secondary hyperparathyroidism of renal origin: Secondary | ICD-10-CM | POA: Diagnosis not present

## 2019-10-04 DIAGNOSIS — E875 Hyperkalemia: Secondary | ICD-10-CM | POA: Diagnosis not present

## 2019-10-05 DIAGNOSIS — I129 Hypertensive chronic kidney disease with stage 1 through stage 4 chronic kidney disease, or unspecified chronic kidney disease: Secondary | ICD-10-CM | POA: Diagnosis not present

## 2019-10-05 DIAGNOSIS — N186 End stage renal disease: Secondary | ICD-10-CM | POA: Diagnosis not present

## 2019-10-05 DIAGNOSIS — Z992 Dependence on renal dialysis: Secondary | ICD-10-CM | POA: Diagnosis not present

## 2019-10-06 DIAGNOSIS — D509 Iron deficiency anemia, unspecified: Secondary | ICD-10-CM | POA: Diagnosis not present

## 2019-10-06 DIAGNOSIS — E875 Hyperkalemia: Secondary | ICD-10-CM | POA: Diagnosis not present

## 2019-10-06 DIAGNOSIS — Z992 Dependence on renal dialysis: Secondary | ICD-10-CM | POA: Diagnosis not present

## 2019-10-06 DIAGNOSIS — N2581 Secondary hyperparathyroidism of renal origin: Secondary | ICD-10-CM | POA: Diagnosis not present

## 2019-10-06 DIAGNOSIS — D631 Anemia in chronic kidney disease: Secondary | ICD-10-CM | POA: Diagnosis not present

## 2019-10-06 DIAGNOSIS — N186 End stage renal disease: Secondary | ICD-10-CM | POA: Diagnosis not present

## 2019-10-14 DIAGNOSIS — M79602 Pain in left arm: Secondary | ICD-10-CM | POA: Diagnosis not present

## 2019-10-20 ENCOUNTER — Ambulatory Visit: Payer: Medicare Other

## 2019-11-05 DIAGNOSIS — Z992 Dependence on renal dialysis: Secondary | ICD-10-CM | POA: Diagnosis not present

## 2019-11-05 DIAGNOSIS — N186 End stage renal disease: Secondary | ICD-10-CM | POA: Diagnosis not present

## 2019-11-05 DIAGNOSIS — I129 Hypertensive chronic kidney disease with stage 1 through stage 4 chronic kidney disease, or unspecified chronic kidney disease: Secondary | ICD-10-CM | POA: Diagnosis not present

## 2019-11-06 DIAGNOSIS — D509 Iron deficiency anemia, unspecified: Secondary | ICD-10-CM | POA: Diagnosis not present

## 2019-11-06 DIAGNOSIS — E875 Hyperkalemia: Secondary | ICD-10-CM | POA: Diagnosis not present

## 2019-11-06 DIAGNOSIS — Z992 Dependence on renal dialysis: Secondary | ICD-10-CM | POA: Diagnosis not present

## 2019-11-06 DIAGNOSIS — N2581 Secondary hyperparathyroidism of renal origin: Secondary | ICD-10-CM | POA: Diagnosis not present

## 2019-11-06 DIAGNOSIS — N186 End stage renal disease: Secondary | ICD-10-CM | POA: Diagnosis not present

## 2019-11-06 DIAGNOSIS — D631 Anemia in chronic kidney disease: Secondary | ICD-10-CM | POA: Diagnosis not present

## 2019-11-08 DIAGNOSIS — D509 Iron deficiency anemia, unspecified: Secondary | ICD-10-CM | POA: Diagnosis not present

## 2019-11-08 DIAGNOSIS — D631 Anemia in chronic kidney disease: Secondary | ICD-10-CM | POA: Diagnosis not present

## 2019-11-08 DIAGNOSIS — N186 End stage renal disease: Secondary | ICD-10-CM | POA: Diagnosis not present

## 2019-11-08 DIAGNOSIS — E875 Hyperkalemia: Secondary | ICD-10-CM | POA: Diagnosis not present

## 2019-11-08 DIAGNOSIS — Z992 Dependence on renal dialysis: Secondary | ICD-10-CM | POA: Diagnosis not present

## 2019-11-08 DIAGNOSIS — N2581 Secondary hyperparathyroidism of renal origin: Secondary | ICD-10-CM | POA: Diagnosis not present

## 2019-11-10 ENCOUNTER — Ambulatory Visit: Payer: Medicare Other | Admitting: Family Medicine

## 2019-11-10 DIAGNOSIS — D631 Anemia in chronic kidney disease: Secondary | ICD-10-CM | POA: Diagnosis not present

## 2019-11-10 DIAGNOSIS — N186 End stage renal disease: Secondary | ICD-10-CM | POA: Diagnosis not present

## 2019-11-10 DIAGNOSIS — N2581 Secondary hyperparathyroidism of renal origin: Secondary | ICD-10-CM | POA: Diagnosis not present

## 2019-11-10 DIAGNOSIS — Z992 Dependence on renal dialysis: Secondary | ICD-10-CM | POA: Diagnosis not present

## 2019-11-10 DIAGNOSIS — E875 Hyperkalemia: Secondary | ICD-10-CM | POA: Diagnosis not present

## 2019-11-10 DIAGNOSIS — D509 Iron deficiency anemia, unspecified: Secondary | ICD-10-CM | POA: Diagnosis not present

## 2019-11-11 DIAGNOSIS — H5340 Unspecified visual field defects: Secondary | ICD-10-CM | POA: Diagnosis not present

## 2019-11-11 DIAGNOSIS — H40023 Open angle with borderline findings, high risk, bilateral: Secondary | ICD-10-CM | POA: Diagnosis not present

## 2019-11-11 DIAGNOSIS — H04123 Dry eye syndrome of bilateral lacrimal glands: Secondary | ICD-10-CM | POA: Diagnosis not present

## 2019-11-11 DIAGNOSIS — H1045 Other chronic allergic conjunctivitis: Secondary | ICD-10-CM | POA: Diagnosis not present

## 2019-11-15 ENCOUNTER — Other Ambulatory Visit (HOSPITAL_COMMUNITY): Payer: Self-pay | Admitting: Nephrology

## 2019-11-15 DIAGNOSIS — N186 End stage renal disease: Secondary | ICD-10-CM

## 2019-11-15 DIAGNOSIS — E875 Hyperkalemia: Secondary | ICD-10-CM | POA: Diagnosis not present

## 2019-11-15 DIAGNOSIS — Z992 Dependence on renal dialysis: Secondary | ICD-10-CM | POA: Diagnosis not present

## 2019-11-15 DIAGNOSIS — D509 Iron deficiency anemia, unspecified: Secondary | ICD-10-CM | POA: Diagnosis not present

## 2019-11-15 DIAGNOSIS — N2581 Secondary hyperparathyroidism of renal origin: Secondary | ICD-10-CM | POA: Diagnosis not present

## 2019-11-15 DIAGNOSIS — D631 Anemia in chronic kidney disease: Secondary | ICD-10-CM | POA: Diagnosis not present

## 2019-11-16 ENCOUNTER — Other Ambulatory Visit: Payer: Self-pay | Admitting: Radiology

## 2019-11-16 ENCOUNTER — Other Ambulatory Visit: Payer: Self-pay | Admitting: Student

## 2019-11-17 ENCOUNTER — Other Ambulatory Visit: Payer: Self-pay

## 2019-11-17 ENCOUNTER — Ambulatory Visit (HOSPITAL_COMMUNITY)
Admission: RE | Admit: 2019-11-17 | Discharge: 2019-11-17 | Disposition: A | Discharge status: Home / Self Care | Payer: Medicare Other | Source: Ambulatory Visit | Attending: Nephrology | Admitting: Nephrology

## 2019-11-17 DIAGNOSIS — Z4901 Encounter for fitting and adjustment of extracorporeal dialysis catheter: Secondary | ICD-10-CM | POA: Diagnosis not present

## 2019-11-17 DIAGNOSIS — I120 Hypertensive chronic kidney disease with stage 5 chronic kidney disease or end stage renal disease: Secondary | ICD-10-CM | POA: Diagnosis not present

## 2019-11-17 DIAGNOSIS — N186 End stage renal disease: Secondary | ICD-10-CM | POA: Diagnosis not present

## 2019-11-17 DIAGNOSIS — T8249XA Other complication of vascular dialysis catheter, initial encounter: Secondary | ICD-10-CM | POA: Diagnosis not present

## 2019-11-17 DIAGNOSIS — E785 Hyperlipidemia, unspecified: Secondary | ICD-10-CM | POA: Insufficient documentation

## 2019-11-17 DIAGNOSIS — E2740 Unspecified adrenocortical insufficiency: Secondary | ICD-10-CM | POA: Insufficient documentation

## 2019-11-17 HISTORY — PX: IR FLUORO GUIDE CV LINE RIGHT: IMG2283

## 2019-11-17 MED ORDER — CEFAZOLIN SODIUM-DEXTROSE 2-4 GM/100ML-% IV SOLN: 2.0000 g | INTRAVENOUS | Status: DC

## 2019-11-17 MED ORDER — CHLORHEXIDINE GLUCONATE 4 % EX LIQD
CUTANEOUS | Status: AC
  Filled 2019-11-17: qty 15

## 2019-11-17 MED ORDER — MIDAZOLAM HCL 2 MG/2ML IJ SOLN
INTRAMUSCULAR | Status: AC
  Filled 2019-11-17: qty 2

## 2019-11-17 MED ORDER — CEFAZOLIN SODIUM-DEXTROSE 2-4 GM/100ML-% IV SOLN: 2.0000 g | Freq: Once | INTRAVENOUS | Status: AC

## 2019-11-17 MED ORDER — LIDOCAINE HCL 1 % IJ SOLN
INTRAMUSCULAR | Status: AC
  Filled 2019-11-17: qty 20

## 2019-11-17 MED ORDER — HEPARIN SODIUM (PORCINE) 1000 UNIT/ML IJ SOLN
INTRAMUSCULAR | Status: AC
  Administered 2019-11-17: 3.8 mL
  Filled 2019-11-17: qty 1

## 2019-11-17 MED ORDER — FENTANYL CITRATE (PF) 100 MCG/2ML IJ SOLN
INTRAMUSCULAR | Status: AC
  Filled 2019-11-17: qty 2

## 2019-11-17 MED ORDER — FENTANYL CITRATE (PF) 100 MCG/2ML IJ SOLN
INTRAMUSCULAR | Status: AC | PRN
  Administered 2019-11-17: 50 ug via INTRAVENOUS

## 2019-11-17 MED ORDER — LIDOCAINE HCL (PF) 1 % IJ SOLN
INTRAMUSCULAR | Status: AC | PRN
  Administered 2019-11-17: 10 mL

## 2019-11-17 MED ORDER — CEFAZOLIN SODIUM-DEXTROSE 2-4 GM/100ML-% IV SOLN
INTRAVENOUS | Status: AC
  Administered 2019-11-17: 2 g via INTRAVENOUS
  Filled 2019-11-17: qty 100

## 2019-11-17 MED ORDER — SODIUM CHLORIDE 0.9 % IV SOLN: INTRAVENOUS | Status: DC

## 2019-11-17 MED ORDER — MIDAZOLAM HCL 2 MG/2ML IJ SOLN
INTRAMUSCULAR | Status: AC | PRN
  Administered 2019-11-17: 1 mg via INTRAVENOUS

## 2019-11-17 NOTE — Progress Notes (Addendum)
Cristie Hem, PA in to assess cath site to right upper chest.pressure held, Area cleaned, and new dressing applied by [employer].

## 2019-11-17 NOTE — Progress Notes (Signed)
No further bleeding noted from [facility] cath site

## 2019-11-17 NOTE — Progress Notes (Signed)
PA paged to bedside to assess catheter.  Small amount of fresh blood at insertion site, however no active oozing upon assessment.  Pressure held for an additional 5 minutes without bleeding.  Sat patient upright, still no active bleeding or new fresh blood.  Area cleaned, re-prepped, biopatch and dressing replaced.   Stable for discharge home.   Brynda Greathouse, MS RD PA-C

## 2019-11-17 NOTE — H&P (Signed)
   Patient Status: [name] - Out-pt  Assessment and Plan:  49 y/o M with history of ESRD on HD for approximately 20 years who presents today for tunneled HD catheter repair/exchange due to concern for venous lumen air leak.   Risks and benefits discussed with the patient including, but not limited to bleeding, infection, vascular injury, pneumothorax which may require chest tube placement, air embolism or even death.  All of the patient's questions were answered, patient is agreeable to proceed.  Consent signed and in chart.  ______________________________________________________________________   History of Present Illness: Marvin Roberson is a 49 y.o. male with history significant for adrenal insufficiency, anemia, HTN, HLD and ESRD on HD MWF via right tunneled IJ catheter who presents today for catheter evaluation due to a concern for a venous lumen air leak. Marvin Roberson reports that he is from [place] originally and is trying to move back up there soon. He has been on dialysis for the last 20 years and has never really had too many issues with his catheter or dialysis. He has not missed any HD sessions in almost a year. He does have a left upper extremity graft which has not been used in over a year due to pain and an aneurysm. He states that he feels well overall and is ready to proceed with catheter evaluation today.   Allergies and medications reviewed.   Review of Systems: A 12 point ROS discussed and pertinent positives are indicated in the HPI above.  All other systems are negative.  Review of Systems  Constitutional: Negative for chills and fever.  Respiratory: Negative for cough and shortness of breath.   Cardiovascular: Negative for chest pain.  Gastrointestinal: Negative for abdominal pain, nausea and vomiting.  Neurological: Positive for headaches (none currently ). Negative for dizziness.    Vital Signs: There were no vitals taken for this visit.  Physical Exam Constitutional:        General: He is not in acute distress. HENT:     Head: Normocephalic.  Cardiovascular:     Rate and Rhythm: Normal rate and regular rhythm.     Comments: (+) right tunneled HD catheter Pulmonary:     Effort: Pulmonary effort is normal.     Breath sounds: Normal breath sounds.  Skin:    General: Skin is warm and dry.  Neurological:     Mental Status: He is alert and oriented to person, place, and time.  Psychiatric:        Mood and Affect: Mood normal.        Behavior: Behavior normal.        Thought Content: Thought content normal.        Judgment: Judgment normal.     Imaging reviewed.   Labs:  COAGS: No results for input(s): INR, APTT in the last 8760 hours.  BMP: No results for input(s): NA, K, CL, CO2, GLUCOSE, BUN, CALCIUM, CREATININE, GFRNONAA, GFRAA in the last 8760 hours.  Invalid input(s): CMP   Electronically Signed: Joaquim Nam, PA-C 11/17/2019, 7:54 AM   I spent a total of 15 minutes in face to face in clinical consultation, greater than 50% of which was counseling/coordinating care for venous access.

## 2019-11-17 NOTE — Progress Notes (Signed)
Discharge instructions reviewed with pt and his friend (via telephone) voices understanding.

## 2019-11-17 NOTE — Progress Notes (Addendum)
Oozing noted from [facility] dialysis cath site to right upper chest. PA paged. Pressure held to site

## 2019-11-17 NOTE — Discharge Instructions (Signed)
Moderate Conscious Sedation, Adult, Care After These instructions provide you with information about caring for yourself after your procedure. Your health care provider may also give you more specific instructions. Your treatment has been planned according to current medical practices, but problems sometimes occur. Call your health care provider if you have any problems or questions after your procedure. What can I expect after the procedure? After your procedure, it is common:  To feel sleepy for several hours.  To feel clumsy and have poor balance for several hours.  To have poor judgment for several hours.  To vomit if you eat too soon. Follow these instructions at home: For at least 24 hours after the procedure:   Do not: ? Participate in activities where you could fall or become injured. ? Drive. ? Use heavy machinery. ? Drink alcohol. ? Take sleeping pills or medicines that cause drowsiness. ? Make important decisions or sign legal documents. ? Take care of children on your own.  Rest. Eating and drinking  Follow the diet recommended by your health care provider.  If you vomit: ? Drink water, juice, or soup when you can drink without vomiting. ? Make sure you have little or no nausea before eating solid foods. General instructions  Have a responsible adult stay with you until you are awake and alert.  Take over-the-counter and prescription medicines only as told by your health care provider.  If you smoke, do not smoke without supervision.  Keep all follow-up visits as told by your health care provider. This is important. Contact a health care provider if:  You keep feeling nauseous or you keep vomiting.  You feel light-headed.  You develop a rash.  You have a fever. Get help right away if:  You have trouble breathing. This information is not intended to replace advice given to you by your health care provider. Make sure you discuss any questions you have  with your health care provider. Document Revised: 10/03/2017 Document Reviewed: 02/10/2016 Elsevier Patient Education  2020 [employer]  

## 2019-11-17 NOTE — Procedures (Signed)
Interventional Radiology Procedure:   Indications: Dialysis catheter dysfunction  Procedure: Exchange of tunneled dialysis catheter  Findings: New 23 cm tip to cuff Palindrome placed, tip in right atrium  Complications: none     EBL: less than 10 ml  Plan: Catheter is ready to use.   Aeneas Longsworth R. Anselm Pancoast, MD  Pager: [phone]

## 2019-11-19 DIAGNOSIS — Z992 Dependence on renal dialysis: Secondary | ICD-10-CM | POA: Diagnosis not present

## 2019-11-19 DIAGNOSIS — D631 Anemia in chronic kidney disease: Secondary | ICD-10-CM | POA: Diagnosis not present

## 2019-11-19 DIAGNOSIS — N186 End stage renal disease: Secondary | ICD-10-CM | POA: Diagnosis not present

## 2019-11-19 DIAGNOSIS — E875 Hyperkalemia: Secondary | ICD-10-CM | POA: Diagnosis not present

## 2019-11-19 DIAGNOSIS — D509 Iron deficiency anemia, unspecified: Secondary | ICD-10-CM | POA: Diagnosis not present

## 2019-11-19 DIAGNOSIS — N2581 Secondary hyperparathyroidism of renal origin: Secondary | ICD-10-CM | POA: Diagnosis not present

## 2019-11-22 DIAGNOSIS — N2581 Secondary hyperparathyroidism of renal origin: Secondary | ICD-10-CM | POA: Diagnosis not present

## 2019-11-22 DIAGNOSIS — N186 End stage renal disease: Secondary | ICD-10-CM | POA: Diagnosis not present

## 2019-11-22 DIAGNOSIS — D509 Iron deficiency anemia, unspecified: Secondary | ICD-10-CM | POA: Diagnosis not present

## 2019-11-22 DIAGNOSIS — Z992 Dependence on renal dialysis: Secondary | ICD-10-CM | POA: Diagnosis not present

## 2019-11-22 DIAGNOSIS — D631 Anemia in chronic kidney disease: Secondary | ICD-10-CM | POA: Diagnosis not present

## 2019-11-22 DIAGNOSIS — E875 Hyperkalemia: Secondary | ICD-10-CM | POA: Diagnosis not present

## 2019-11-23 DIAGNOSIS — M25542 Pain in joints of left hand: Secondary | ICD-10-CM | POA: Diagnosis not present

## 2019-11-23 DIAGNOSIS — N186 End stage renal disease: Secondary | ICD-10-CM | POA: Diagnosis not present

## 2019-11-23 DIAGNOSIS — M25642 Stiffness of left hand, not elsewhere classified: Secondary | ICD-10-CM | POA: Diagnosis not present

## 2019-11-23 DIAGNOSIS — M25532 Pain in left wrist: Secondary | ICD-10-CM | POA: Diagnosis not present

## 2019-11-23 DIAGNOSIS — M6281 Muscle weakness (generalized): Secondary | ICD-10-CM | POA: Diagnosis not present

## 2019-11-23 DIAGNOSIS — G5622 Lesion of ulnar nerve, left upper limb: Secondary | ICD-10-CM | POA: Diagnosis not present

## 2019-11-24 DIAGNOSIS — Z992 Dependence on renal dialysis: Secondary | ICD-10-CM | POA: Diagnosis not present

## 2019-11-24 DIAGNOSIS — N186 End stage renal disease: Secondary | ICD-10-CM | POA: Diagnosis not present

## 2019-11-24 DIAGNOSIS — E875 Hyperkalemia: Secondary | ICD-10-CM | POA: Diagnosis not present

## 2019-11-24 DIAGNOSIS — D631 Anemia in chronic kidney disease: Secondary | ICD-10-CM | POA: Diagnosis not present

## 2019-11-24 DIAGNOSIS — N2581 Secondary hyperparathyroidism of renal origin: Secondary | ICD-10-CM | POA: Diagnosis not present

## 2019-11-24 DIAGNOSIS — D509 Iron deficiency anemia, unspecified: Secondary | ICD-10-CM | POA: Diagnosis not present

## 2019-11-26 DIAGNOSIS — D509 Iron deficiency anemia, unspecified: Secondary | ICD-10-CM | POA: Diagnosis not present

## 2019-11-26 DIAGNOSIS — N186 End stage renal disease: Secondary | ICD-10-CM | POA: Diagnosis not present

## 2019-11-26 DIAGNOSIS — N2581 Secondary hyperparathyroidism of renal origin: Secondary | ICD-10-CM | POA: Diagnosis not present

## 2019-11-26 DIAGNOSIS — Z992 Dependence on renal dialysis: Secondary | ICD-10-CM | POA: Diagnosis not present

## 2019-11-26 DIAGNOSIS — D631 Anemia in chronic kidney disease: Secondary | ICD-10-CM | POA: Diagnosis not present

## 2019-11-26 DIAGNOSIS — E875 Hyperkalemia: Secondary | ICD-10-CM | POA: Diagnosis not present

## 2019-11-29 DIAGNOSIS — N2581 Secondary hyperparathyroidism of renal origin: Secondary | ICD-10-CM | POA: Diagnosis not present

## 2019-11-29 DIAGNOSIS — E875 Hyperkalemia: Secondary | ICD-10-CM | POA: Diagnosis not present

## 2019-11-29 DIAGNOSIS — D631 Anemia in chronic kidney disease: Secondary | ICD-10-CM | POA: Diagnosis not present

## 2019-11-29 DIAGNOSIS — Z992 Dependence on renal dialysis: Secondary | ICD-10-CM | POA: Diagnosis not present

## 2019-11-29 DIAGNOSIS — N186 End stage renal disease: Secondary | ICD-10-CM | POA: Diagnosis not present

## 2019-11-29 DIAGNOSIS — D509 Iron deficiency anemia, unspecified: Secondary | ICD-10-CM | POA: Diagnosis not present

## 2019-11-30 DIAGNOSIS — M6281 Muscle weakness (generalized): Secondary | ICD-10-CM | POA: Diagnosis not present

## 2019-11-30 DIAGNOSIS — M25542 Pain in joints of left hand: Secondary | ICD-10-CM | POA: Diagnosis not present

## 2019-11-30 DIAGNOSIS — G5622 Lesion of ulnar nerve, left upper limb: Secondary | ICD-10-CM | POA: Diagnosis not present

## 2019-11-30 DIAGNOSIS — M25642 Stiffness of left hand, not elsewhere classified: Secondary | ICD-10-CM | POA: Diagnosis not present

## 2019-11-30 DIAGNOSIS — M25532 Pain in left wrist: Secondary | ICD-10-CM | POA: Diagnosis not present

## 2019-11-30 DIAGNOSIS — N186 End stage renal disease: Secondary | ICD-10-CM | POA: Diagnosis not present

## 2019-12-01 DIAGNOSIS — D509 Iron deficiency anemia, unspecified: Secondary | ICD-10-CM | POA: Diagnosis not present

## 2019-12-01 DIAGNOSIS — Z992 Dependence on renal dialysis: Secondary | ICD-10-CM | POA: Diagnosis not present

## 2019-12-01 DIAGNOSIS — N186 End stage renal disease: Secondary | ICD-10-CM | POA: Diagnosis not present

## 2019-12-01 DIAGNOSIS — N2581 Secondary hyperparathyroidism of renal origin: Secondary | ICD-10-CM | POA: Diagnosis not present

## 2019-12-01 DIAGNOSIS — D631 Anemia in chronic kidney disease: Secondary | ICD-10-CM | POA: Diagnosis not present

## 2019-12-01 DIAGNOSIS — E875 Hyperkalemia: Secondary | ICD-10-CM | POA: Diagnosis not present

## 2019-12-02 DIAGNOSIS — N186 End stage renal disease: Secondary | ICD-10-CM | POA: Diagnosis not present

## 2019-12-02 DIAGNOSIS — M25642 Stiffness of left hand, not elsewhere classified: Secondary | ICD-10-CM | POA: Diagnosis not present

## 2019-12-02 DIAGNOSIS — M25532 Pain in left wrist: Secondary | ICD-10-CM | POA: Diagnosis not present

## 2019-12-02 DIAGNOSIS — M25542 Pain in joints of left hand: Secondary | ICD-10-CM | POA: Diagnosis not present

## 2019-12-02 DIAGNOSIS — G5622 Lesion of ulnar nerve, left upper limb: Secondary | ICD-10-CM | POA: Diagnosis not present

## 2019-12-02 DIAGNOSIS — M6281 Muscle weakness (generalized): Secondary | ICD-10-CM | POA: Diagnosis not present

## 2019-12-03 DIAGNOSIS — Z992 Dependence on renal dialysis: Secondary | ICD-10-CM | POA: Diagnosis not present

## 2019-12-03 DIAGNOSIS — D631 Anemia in chronic kidney disease: Secondary | ICD-10-CM | POA: Diagnosis not present

## 2019-12-03 DIAGNOSIS — N2581 Secondary hyperparathyroidism of renal origin: Secondary | ICD-10-CM | POA: Diagnosis not present

## 2019-12-03 DIAGNOSIS — E875 Hyperkalemia: Secondary | ICD-10-CM | POA: Diagnosis not present

## 2019-12-03 DIAGNOSIS — D509 Iron deficiency anemia, unspecified: Secondary | ICD-10-CM | POA: Diagnosis not present

## 2019-12-03 DIAGNOSIS — N186 End stage renal disease: Secondary | ICD-10-CM | POA: Diagnosis not present

## 2019-12-06 DIAGNOSIS — E875 Hyperkalemia: Secondary | ICD-10-CM | POA: Diagnosis not present

## 2019-12-06 DIAGNOSIS — D631 Anemia in chronic kidney disease: Secondary | ICD-10-CM | POA: Diagnosis not present

## 2019-12-06 DIAGNOSIS — D509 Iron deficiency anemia, unspecified: Secondary | ICD-10-CM | POA: Diagnosis not present

## 2019-12-06 DIAGNOSIS — I129 Hypertensive chronic kidney disease with stage 1 through stage 4 chronic kidney disease, or unspecified chronic kidney disease: Secondary | ICD-10-CM | POA: Diagnosis not present

## 2019-12-06 DIAGNOSIS — N186 End stage renal disease: Secondary | ICD-10-CM | POA: Diagnosis not present

## 2019-12-06 DIAGNOSIS — Z992 Dependence on renal dialysis: Secondary | ICD-10-CM | POA: Diagnosis not present

## 2019-12-06 DIAGNOSIS — N2581 Secondary hyperparathyroidism of renal origin: Secondary | ICD-10-CM | POA: Diagnosis not present

## 2019-12-07 DIAGNOSIS — I120 Hypertensive chronic kidney disease with stage 5 chronic kidney disease or end stage renal disease: Secondary | ICD-10-CM | POA: Diagnosis not present

## 2019-12-07 DIAGNOSIS — F17210 Nicotine dependence, cigarettes, uncomplicated: Secondary | ICD-10-CM | POA: Diagnosis not present

## 2019-12-07 DIAGNOSIS — N186 End stage renal disease: Secondary | ICD-10-CM | POA: Diagnosis not present

## 2019-12-07 DIAGNOSIS — G8929 Other chronic pain: Secondary | ICD-10-CM | POA: Diagnosis not present

## 2019-12-07 DIAGNOSIS — Z79899 Other long term (current) drug therapy: Secondary | ICD-10-CM | POA: Diagnosis not present

## 2019-12-07 DIAGNOSIS — M79602 Pain in left arm: Secondary | ICD-10-CM | POA: Diagnosis not present

## 2019-12-07 DIAGNOSIS — K469 Unspecified abdominal hernia without obstruction or gangrene: Secondary | ICD-10-CM | POA: Diagnosis not present

## 2019-12-07 DIAGNOSIS — K219 Gastro-esophageal reflux disease without esophagitis: Secondary | ICD-10-CM | POA: Diagnosis not present

## 2019-12-07 DIAGNOSIS — G43909 Migraine, unspecified, not intractable, without status migrainosus: Secondary | ICD-10-CM | POA: Diagnosis not present

## 2019-12-07 DIAGNOSIS — E162 Hypoglycemia, unspecified: Secondary | ICD-10-CM | POA: Diagnosis not present

## 2019-12-07 DIAGNOSIS — I2510 Atherosclerotic heart disease of native coronary artery without angina pectoris: Secondary | ICD-10-CM | POA: Diagnosis not present

## 2019-12-07 DIAGNOSIS — E785 Hyperlipidemia, unspecified: Secondary | ICD-10-CM | POA: Diagnosis not present

## 2019-12-08 DIAGNOSIS — D509 Iron deficiency anemia, unspecified: Secondary | ICD-10-CM | POA: Diagnosis not present

## 2019-12-08 DIAGNOSIS — D631 Anemia in chronic kidney disease: Secondary | ICD-10-CM | POA: Diagnosis not present

## 2019-12-08 DIAGNOSIS — E875 Hyperkalemia: Secondary | ICD-10-CM | POA: Diagnosis not present

## 2019-12-08 DIAGNOSIS — N2581 Secondary hyperparathyroidism of renal origin: Secondary | ICD-10-CM | POA: Diagnosis not present

## 2019-12-08 DIAGNOSIS — N186 End stage renal disease: Secondary | ICD-10-CM | POA: Diagnosis not present

## 2019-12-08 DIAGNOSIS — Z992 Dependence on renal dialysis: Secondary | ICD-10-CM | POA: Diagnosis not present

## 2019-12-09 DIAGNOSIS — M25542 Pain in joints of left hand: Secondary | ICD-10-CM | POA: Diagnosis not present

## 2019-12-09 DIAGNOSIS — M25532 Pain in left wrist: Secondary | ICD-10-CM | POA: Diagnosis not present

## 2019-12-09 DIAGNOSIS — M25642 Stiffness of left hand, not elsewhere classified: Secondary | ICD-10-CM | POA: Diagnosis not present

## 2019-12-09 DIAGNOSIS — G5622 Lesion of ulnar nerve, left upper limb: Secondary | ICD-10-CM | POA: Diagnosis not present

## 2019-12-09 DIAGNOSIS — N186 End stage renal disease: Secondary | ICD-10-CM | POA: Diagnosis not present

## 2019-12-09 DIAGNOSIS — M6281 Muscle weakness (generalized): Secondary | ICD-10-CM | POA: Diagnosis not present

## 2019-12-10 DIAGNOSIS — E875 Hyperkalemia: Secondary | ICD-10-CM | POA: Diagnosis not present

## 2019-12-10 DIAGNOSIS — Z992 Dependence on renal dialysis: Secondary | ICD-10-CM | POA: Diagnosis not present

## 2019-12-10 DIAGNOSIS — N2581 Secondary hyperparathyroidism of renal origin: Secondary | ICD-10-CM | POA: Diagnosis not present

## 2019-12-10 DIAGNOSIS — D509 Iron deficiency anemia, unspecified: Secondary | ICD-10-CM | POA: Diagnosis not present

## 2019-12-10 DIAGNOSIS — D631 Anemia in chronic kidney disease: Secondary | ICD-10-CM | POA: Diagnosis not present

## 2019-12-10 DIAGNOSIS — N186 End stage renal disease: Secondary | ICD-10-CM | POA: Diagnosis not present

## 2019-12-13 DIAGNOSIS — N2581 Secondary hyperparathyroidism of renal origin: Secondary | ICD-10-CM | POA: Diagnosis not present

## 2019-12-13 DIAGNOSIS — D631 Anemia in chronic kidney disease: Secondary | ICD-10-CM | POA: Diagnosis not present

## 2019-12-13 DIAGNOSIS — N186 End stage renal disease: Secondary | ICD-10-CM | POA: Diagnosis not present

## 2019-12-13 DIAGNOSIS — Z992 Dependence on renal dialysis: Secondary | ICD-10-CM | POA: Diagnosis not present

## 2019-12-13 DIAGNOSIS — D509 Iron deficiency anemia, unspecified: Secondary | ICD-10-CM | POA: Diagnosis not present

## 2019-12-13 DIAGNOSIS — E875 Hyperkalemia: Secondary | ICD-10-CM | POA: Diagnosis not present

## 2019-12-15 DIAGNOSIS — D631 Anemia in chronic kidney disease: Secondary | ICD-10-CM | POA: Diagnosis not present

## 2019-12-15 DIAGNOSIS — Z992 Dependence on renal dialysis: Secondary | ICD-10-CM | POA: Diagnosis not present

## 2019-12-15 DIAGNOSIS — N2581 Secondary hyperparathyroidism of renal origin: Secondary | ICD-10-CM | POA: Diagnosis not present

## 2019-12-15 DIAGNOSIS — D509 Iron deficiency anemia, unspecified: Secondary | ICD-10-CM | POA: Diagnosis not present

## 2019-12-15 DIAGNOSIS — E875 Hyperkalemia: Secondary | ICD-10-CM | POA: Diagnosis not present

## 2019-12-15 DIAGNOSIS — N186 End stage renal disease: Secondary | ICD-10-CM | POA: Diagnosis not present

## 2019-12-17 DIAGNOSIS — N186 End stage renal disease: Secondary | ICD-10-CM | POA: Diagnosis not present

## 2019-12-17 DIAGNOSIS — E875 Hyperkalemia: Secondary | ICD-10-CM | POA: Diagnosis not present

## 2019-12-17 DIAGNOSIS — N2581 Secondary hyperparathyroidism of renal origin: Secondary | ICD-10-CM | POA: Diagnosis not present

## 2019-12-17 DIAGNOSIS — D631 Anemia in chronic kidney disease: Secondary | ICD-10-CM | POA: Diagnosis not present

## 2019-12-17 DIAGNOSIS — D509 Iron deficiency anemia, unspecified: Secondary | ICD-10-CM | POA: Diagnosis not present

## 2019-12-17 DIAGNOSIS — Z992 Dependence on renal dialysis: Secondary | ICD-10-CM | POA: Diagnosis not present

## 2019-12-20 DIAGNOSIS — Z992 Dependence on renal dialysis: Secondary | ICD-10-CM | POA: Diagnosis not present

## 2019-12-20 DIAGNOSIS — D509 Iron deficiency anemia, unspecified: Secondary | ICD-10-CM | POA: Diagnosis not present

## 2019-12-20 DIAGNOSIS — D631 Anemia in chronic kidney disease: Secondary | ICD-10-CM | POA: Diagnosis not present

## 2019-12-20 DIAGNOSIS — N186 End stage renal disease: Secondary | ICD-10-CM | POA: Diagnosis not present

## 2019-12-20 DIAGNOSIS — N2581 Secondary hyperparathyroidism of renal origin: Secondary | ICD-10-CM | POA: Diagnosis not present

## 2019-12-20 DIAGNOSIS — E875 Hyperkalemia: Secondary | ICD-10-CM | POA: Diagnosis not present

## 2019-12-21 DIAGNOSIS — M6281 Muscle weakness (generalized): Secondary | ICD-10-CM | POA: Diagnosis not present

## 2019-12-21 DIAGNOSIS — M25642 Stiffness of left hand, not elsewhere classified: Secondary | ICD-10-CM | POA: Diagnosis not present

## 2019-12-21 DIAGNOSIS — G5622 Lesion of ulnar nerve, left upper limb: Secondary | ICD-10-CM | POA: Diagnosis not present

## 2019-12-21 DIAGNOSIS — M25532 Pain in left wrist: Secondary | ICD-10-CM | POA: Diagnosis not present

## 2019-12-21 DIAGNOSIS — N186 End stage renal disease: Secondary | ICD-10-CM | POA: Diagnosis not present

## 2019-12-21 DIAGNOSIS — M25542 Pain in joints of left hand: Secondary | ICD-10-CM | POA: Diagnosis not present

## 2019-12-22 DIAGNOSIS — D631 Anemia in chronic kidney disease: Secondary | ICD-10-CM | POA: Diagnosis not present

## 2019-12-22 DIAGNOSIS — E875 Hyperkalemia: Secondary | ICD-10-CM | POA: Diagnosis not present

## 2019-12-22 DIAGNOSIS — N186 End stage renal disease: Secondary | ICD-10-CM | POA: Diagnosis not present

## 2019-12-22 DIAGNOSIS — Z992 Dependence on renal dialysis: Secondary | ICD-10-CM | POA: Diagnosis not present

## 2019-12-22 DIAGNOSIS — N2581 Secondary hyperparathyroidism of renal origin: Secondary | ICD-10-CM | POA: Diagnosis not present

## 2019-12-22 DIAGNOSIS — D509 Iron deficiency anemia, unspecified: Secondary | ICD-10-CM | POA: Diagnosis not present

## 2019-12-24 DIAGNOSIS — N2581 Secondary hyperparathyroidism of renal origin: Secondary | ICD-10-CM | POA: Diagnosis not present

## 2019-12-24 DIAGNOSIS — N186 End stage renal disease: Secondary | ICD-10-CM | POA: Diagnosis not present

## 2019-12-24 DIAGNOSIS — D509 Iron deficiency anemia, unspecified: Secondary | ICD-10-CM | POA: Diagnosis not present

## 2019-12-24 DIAGNOSIS — E875 Hyperkalemia: Secondary | ICD-10-CM | POA: Diagnosis not present

## 2019-12-24 DIAGNOSIS — Z992 Dependence on renal dialysis: Secondary | ICD-10-CM | POA: Diagnosis not present

## 2019-12-24 DIAGNOSIS — D631 Anemia in chronic kidney disease: Secondary | ICD-10-CM | POA: Diagnosis not present

## 2019-12-27 DIAGNOSIS — N2581 Secondary hyperparathyroidism of renal origin: Secondary | ICD-10-CM | POA: Diagnosis not present

## 2019-12-27 DIAGNOSIS — Z992 Dependence on renal dialysis: Secondary | ICD-10-CM | POA: Diagnosis not present

## 2019-12-27 DIAGNOSIS — D509 Iron deficiency anemia, unspecified: Secondary | ICD-10-CM | POA: Diagnosis not present

## 2019-12-27 DIAGNOSIS — E875 Hyperkalemia: Secondary | ICD-10-CM | POA: Diagnosis not present

## 2019-12-27 DIAGNOSIS — N186 End stage renal disease: Secondary | ICD-10-CM | POA: Diagnosis not present

## 2019-12-27 DIAGNOSIS — D631 Anemia in chronic kidney disease: Secondary | ICD-10-CM | POA: Diagnosis not present

## 2019-12-29 DIAGNOSIS — E875 Hyperkalemia: Secondary | ICD-10-CM | POA: Diagnosis not present

## 2019-12-29 DIAGNOSIS — N186 End stage renal disease: Secondary | ICD-10-CM | POA: Diagnosis not present

## 2019-12-29 DIAGNOSIS — N2581 Secondary hyperparathyroidism of renal origin: Secondary | ICD-10-CM | POA: Diagnosis not present

## 2019-12-29 DIAGNOSIS — D509 Iron deficiency anemia, unspecified: Secondary | ICD-10-CM | POA: Diagnosis not present

## 2019-12-29 DIAGNOSIS — D631 Anemia in chronic kidney disease: Secondary | ICD-10-CM | POA: Diagnosis not present

## 2019-12-29 DIAGNOSIS — Z992 Dependence on renal dialysis: Secondary | ICD-10-CM | POA: Diagnosis not present

## 2019-12-31 DIAGNOSIS — E875 Hyperkalemia: Secondary | ICD-10-CM | POA: Diagnosis not present

## 2019-12-31 DIAGNOSIS — Z992 Dependence on renal dialysis: Secondary | ICD-10-CM | POA: Diagnosis not present

## 2019-12-31 DIAGNOSIS — N2581 Secondary hyperparathyroidism of renal origin: Secondary | ICD-10-CM | POA: Diagnosis not present

## 2019-12-31 DIAGNOSIS — N186 End stage renal disease: Secondary | ICD-10-CM | POA: Diagnosis not present

## 2019-12-31 DIAGNOSIS — D631 Anemia in chronic kidney disease: Secondary | ICD-10-CM | POA: Diagnosis not present

## 2019-12-31 DIAGNOSIS — D509 Iron deficiency anemia, unspecified: Secondary | ICD-10-CM | POA: Diagnosis not present

## 2020-01-03 DIAGNOSIS — E875 Hyperkalemia: Secondary | ICD-10-CM | POA: Diagnosis not present

## 2020-01-03 DIAGNOSIS — N186 End stage renal disease: Secondary | ICD-10-CM | POA: Diagnosis not present

## 2020-01-03 DIAGNOSIS — I129 Hypertensive chronic kidney disease with stage 1 through stage 4 chronic kidney disease, or unspecified chronic kidney disease: Secondary | ICD-10-CM | POA: Diagnosis not present

## 2020-01-03 DIAGNOSIS — D631 Anemia in chronic kidney disease: Secondary | ICD-10-CM | POA: Diagnosis not present

## 2020-01-03 DIAGNOSIS — Z992 Dependence on renal dialysis: Secondary | ICD-10-CM | POA: Diagnosis not present

## 2020-01-03 DIAGNOSIS — N2581 Secondary hyperparathyroidism of renal origin: Secondary | ICD-10-CM | POA: Diagnosis not present

## 2020-01-04 DIAGNOSIS — G8929 Other chronic pain: Secondary | ICD-10-CM | POA: Diagnosis not present

## 2020-01-04 DIAGNOSIS — I700 Atherosclerosis of aorta: Secondary | ICD-10-CM | POA: Diagnosis not present

## 2020-01-04 DIAGNOSIS — F17210 Nicotine dependence, cigarettes, uncomplicated: Secondary | ICD-10-CM | POA: Diagnosis not present

## 2020-01-04 DIAGNOSIS — M79602 Pain in left arm: Secondary | ICD-10-CM | POA: Diagnosis not present

## 2020-01-04 DIAGNOSIS — I5032 Chronic diastolic (congestive) heart failure: Secondary | ICD-10-CM | POA: Diagnosis not present

## 2020-01-04 DIAGNOSIS — E785 Hyperlipidemia, unspecified: Secondary | ICD-10-CM | POA: Diagnosis not present

## 2020-01-04 DIAGNOSIS — I2510 Atherosclerotic heart disease of native coronary artery without angina pectoris: Secondary | ICD-10-CM | POA: Diagnosis not present

## 2020-01-04 DIAGNOSIS — D6959 Other secondary thrombocytopenia: Secondary | ICD-10-CM | POA: Diagnosis not present

## 2020-01-04 DIAGNOSIS — E46 Unspecified protein-calorie malnutrition: Secondary | ICD-10-CM | POA: Diagnosis not present

## 2020-01-04 DIAGNOSIS — Z0001 Encounter for general adult medical examination with abnormal findings: Secondary | ICD-10-CM | POA: Diagnosis not present

## 2020-01-04 DIAGNOSIS — I132 Hypertensive heart and chronic kidney disease with heart failure and with stage 5 chronic kidney disease, or end stage renal disease: Secondary | ICD-10-CM | POA: Diagnosis not present

## 2020-01-04 DIAGNOSIS — Z7189 Other specified counseling: Secondary | ICD-10-CM | POA: Diagnosis not present

## 2020-01-05 DIAGNOSIS — Z992 Dependence on renal dialysis: Secondary | ICD-10-CM | POA: Diagnosis not present

## 2020-01-05 DIAGNOSIS — N2581 Secondary hyperparathyroidism of renal origin: Secondary | ICD-10-CM | POA: Diagnosis not present

## 2020-01-05 DIAGNOSIS — E875 Hyperkalemia: Secondary | ICD-10-CM | POA: Diagnosis not present

## 2020-01-05 DIAGNOSIS — N186 End stage renal disease: Secondary | ICD-10-CM | POA: Diagnosis not present

## 2020-01-05 DIAGNOSIS — D631 Anemia in chronic kidney disease: Secondary | ICD-10-CM | POA: Diagnosis not present

## 2020-01-07 DIAGNOSIS — E875 Hyperkalemia: Secondary | ICD-10-CM | POA: Diagnosis not present

## 2020-01-07 DIAGNOSIS — Z992 Dependence on renal dialysis: Secondary | ICD-10-CM | POA: Diagnosis not present

## 2020-01-07 DIAGNOSIS — N186 End stage renal disease: Secondary | ICD-10-CM | POA: Diagnosis not present

## 2020-01-07 DIAGNOSIS — N2581 Secondary hyperparathyroidism of renal origin: Secondary | ICD-10-CM | POA: Diagnosis not present

## 2020-01-07 DIAGNOSIS — D631 Anemia in chronic kidney disease: Secondary | ICD-10-CM | POA: Diagnosis not present

## 2020-01-10 DIAGNOSIS — E875 Hyperkalemia: Secondary | ICD-10-CM | POA: Diagnosis not present

## 2020-01-10 DIAGNOSIS — D631 Anemia in chronic kidney disease: Secondary | ICD-10-CM | POA: Diagnosis not present

## 2020-01-10 DIAGNOSIS — N186 End stage renal disease: Secondary | ICD-10-CM | POA: Diagnosis not present

## 2020-01-10 DIAGNOSIS — N2581 Secondary hyperparathyroidism of renal origin: Secondary | ICD-10-CM | POA: Diagnosis not present

## 2020-01-10 DIAGNOSIS — Z992 Dependence on renal dialysis: Secondary | ICD-10-CM | POA: Diagnosis not present

## 2020-01-11 ENCOUNTER — Other Ambulatory Visit: Payer: Self-pay | Admitting: Family

## 2020-01-11 DIAGNOSIS — K4090 Unilateral inguinal hernia, without obstruction or gangrene, not specified as recurrent: Secondary | ICD-10-CM

## 2020-01-14 DIAGNOSIS — Z992 Dependence on renal dialysis: Secondary | ICD-10-CM | POA: Diagnosis not present

## 2020-01-14 DIAGNOSIS — E875 Hyperkalemia: Secondary | ICD-10-CM | POA: Diagnosis not present

## 2020-01-14 DIAGNOSIS — N186 End stage renal disease: Secondary | ICD-10-CM | POA: Diagnosis not present

## 2020-01-14 DIAGNOSIS — D631 Anemia in chronic kidney disease: Secondary | ICD-10-CM | POA: Diagnosis not present

## 2020-01-14 DIAGNOSIS — N2581 Secondary hyperparathyroidism of renal origin: Secondary | ICD-10-CM | POA: Diagnosis not present

## 2020-01-17 DIAGNOSIS — N2581 Secondary hyperparathyroidism of renal origin: Secondary | ICD-10-CM | POA: Diagnosis not present

## 2020-01-17 DIAGNOSIS — Z992 Dependence on renal dialysis: Secondary | ICD-10-CM | POA: Diagnosis not present

## 2020-01-17 DIAGNOSIS — N186 End stage renal disease: Secondary | ICD-10-CM | POA: Diagnosis not present

## 2020-01-17 DIAGNOSIS — D631 Anemia in chronic kidney disease: Secondary | ICD-10-CM | POA: Diagnosis not present

## 2020-01-17 DIAGNOSIS — E875 Hyperkalemia: Secondary | ICD-10-CM | POA: Diagnosis not present

## 2020-01-18 ENCOUNTER — Other Ambulatory Visit: Payer: Self-pay | Admitting: Family

## 2020-01-19 DIAGNOSIS — N186 End stage renal disease: Secondary | ICD-10-CM | POA: Diagnosis not present

## 2020-01-19 DIAGNOSIS — D631 Anemia in chronic kidney disease: Secondary | ICD-10-CM | POA: Diagnosis not present

## 2020-01-19 DIAGNOSIS — N2581 Secondary hyperparathyroidism of renal origin: Secondary | ICD-10-CM | POA: Diagnosis not present

## 2020-01-19 DIAGNOSIS — Z992 Dependence on renal dialysis: Secondary | ICD-10-CM | POA: Diagnosis not present

## 2020-01-19 DIAGNOSIS — E875 Hyperkalemia: Secondary | ICD-10-CM | POA: Diagnosis not present

## 2020-01-21 DIAGNOSIS — N186 End stage renal disease: Secondary | ICD-10-CM | POA: Diagnosis not present

## 2020-01-21 DIAGNOSIS — Z992 Dependence on renal dialysis: Secondary | ICD-10-CM | POA: Diagnosis not present

## 2020-01-21 DIAGNOSIS — N2581 Secondary hyperparathyroidism of renal origin: Secondary | ICD-10-CM | POA: Diagnosis not present

## 2020-01-21 DIAGNOSIS — D631 Anemia in chronic kidney disease: Secondary | ICD-10-CM | POA: Diagnosis not present

## 2020-01-21 DIAGNOSIS — E875 Hyperkalemia: Secondary | ICD-10-CM | POA: Diagnosis not present

## 2020-01-24 DIAGNOSIS — E875 Hyperkalemia: Secondary | ICD-10-CM | POA: Diagnosis not present

## 2020-01-24 DIAGNOSIS — N2581 Secondary hyperparathyroidism of renal origin: Secondary | ICD-10-CM | POA: Diagnosis not present

## 2020-01-24 DIAGNOSIS — Z992 Dependence on renal dialysis: Secondary | ICD-10-CM | POA: Diagnosis not present

## 2020-01-24 DIAGNOSIS — D631 Anemia in chronic kidney disease: Secondary | ICD-10-CM | POA: Diagnosis not present

## 2020-01-24 DIAGNOSIS — N186 End stage renal disease: Secondary | ICD-10-CM | POA: Diagnosis not present

## 2020-01-27 ENCOUNTER — Ambulatory Visit
Admission: RE | Admit: 2020-01-27 | Discharge: 2020-01-27 | Disposition: A | Discharge status: Home / Self Care | Payer: Medicare Other | Source: Ambulatory Visit | Attending: Family | Admitting: Family

## 2020-01-27 ENCOUNTER — Other Ambulatory Visit: Payer: Self-pay

## 2020-01-27 DIAGNOSIS — K7460 Unspecified cirrhosis of liver: Secondary | ICD-10-CM | POA: Diagnosis not present

## 2020-01-27 DIAGNOSIS — N200 Calculus of kidney: Secondary | ICD-10-CM | POA: Diagnosis not present

## 2020-01-27 DIAGNOSIS — K4090 Unilateral inguinal hernia, without obstruction or gangrene, not specified as recurrent: Secondary | ICD-10-CM

## 2020-01-28 DIAGNOSIS — N186 End stage renal disease: Secondary | ICD-10-CM | POA: Diagnosis not present

## 2020-01-28 DIAGNOSIS — D631 Anemia in chronic kidney disease: Secondary | ICD-10-CM | POA: Diagnosis not present

## 2020-01-28 DIAGNOSIS — N2581 Secondary hyperparathyroidism of renal origin: Secondary | ICD-10-CM | POA: Diagnosis not present

## 2020-01-28 DIAGNOSIS — Z992 Dependence on renal dialysis: Secondary | ICD-10-CM | POA: Diagnosis not present

## 2020-01-28 DIAGNOSIS — E875 Hyperkalemia: Secondary | ICD-10-CM | POA: Diagnosis not present

## 2020-01-31 DIAGNOSIS — N2581 Secondary hyperparathyroidism of renal origin: Secondary | ICD-10-CM | POA: Diagnosis not present

## 2020-01-31 DIAGNOSIS — N186 End stage renal disease: Secondary | ICD-10-CM | POA: Diagnosis not present

## 2020-01-31 DIAGNOSIS — E875 Hyperkalemia: Secondary | ICD-10-CM | POA: Diagnosis not present

## 2020-01-31 DIAGNOSIS — Z992 Dependence on renal dialysis: Secondary | ICD-10-CM | POA: Diagnosis not present

## 2020-01-31 DIAGNOSIS — D631 Anemia in chronic kidney disease: Secondary | ICD-10-CM | POA: Diagnosis not present

## 2020-02-02 DIAGNOSIS — N186 End stage renal disease: Secondary | ICD-10-CM | POA: Diagnosis not present

## 2020-02-02 DIAGNOSIS — N2581 Secondary hyperparathyroidism of renal origin: Secondary | ICD-10-CM | POA: Diagnosis not present

## 2020-02-02 DIAGNOSIS — Z992 Dependence on renal dialysis: Secondary | ICD-10-CM | POA: Diagnosis not present

## 2020-02-02 DIAGNOSIS — D631 Anemia in chronic kidney disease: Secondary | ICD-10-CM | POA: Diagnosis not present

## 2020-02-02 DIAGNOSIS — E875 Hyperkalemia: Secondary | ICD-10-CM | POA: Diagnosis not present

## 2020-02-03 DIAGNOSIS — N186 End stage renal disease: Secondary | ICD-10-CM | POA: Diagnosis not present

## 2020-02-03 DIAGNOSIS — Z992 Dependence on renal dialysis: Secondary | ICD-10-CM | POA: Diagnosis not present

## 2020-02-03 DIAGNOSIS — I129 Hypertensive chronic kidney disease with stage 1 through stage 4 chronic kidney disease, or unspecified chronic kidney disease: Secondary | ICD-10-CM | POA: Diagnosis not present

## 2020-02-04 ENCOUNTER — Emergency Department (HOSPITAL_COMMUNITY): Payer: Medicare Other

## 2020-02-04 ENCOUNTER — Emergency Department (HOSPITAL_COMMUNITY)
Admission: EM | Admit: 2020-02-04 | Discharge: 2020-02-04 | Disposition: A | Discharge status: Home / Self Care | Payer: Medicare Other | Attending: Emergency Medicine | Admitting: Emergency Medicine

## 2020-02-04 ENCOUNTER — Other Ambulatory Visit: Payer: Self-pay

## 2020-02-04 DIAGNOSIS — Z992 Dependence on renal dialysis: Secondary | ICD-10-CM | POA: Diagnosis not present

## 2020-02-04 DIAGNOSIS — Z79899 Other long term (current) drug therapy: Secondary | ICD-10-CM | POA: Diagnosis not present

## 2020-02-04 DIAGNOSIS — N186 End stage renal disease: Secondary | ICD-10-CM | POA: Insufficient documentation

## 2020-02-04 DIAGNOSIS — I2510 Atherosclerotic heart disease of native coronary artery without angina pectoris: Secondary | ICD-10-CM | POA: Diagnosis not present

## 2020-02-04 DIAGNOSIS — Y999 Unspecified external cause status: Secondary | ICD-10-CM | POA: Diagnosis not present

## 2020-02-04 DIAGNOSIS — Z86718 Personal history of other venous thrombosis and embolism: Secondary | ICD-10-CM | POA: Diagnosis not present

## 2020-02-04 DIAGNOSIS — S6992XA Unspecified injury of left wrist, hand and finger(s), initial encounter: Secondary | ICD-10-CM | POA: Diagnosis present

## 2020-02-04 DIAGNOSIS — M25531 Pain in right wrist: Secondary | ICD-10-CM | POA: Diagnosis not present

## 2020-02-04 DIAGNOSIS — S62245A Nondisplaced fracture of shaft of first metacarpal bone, left hand, initial encounter for closed fracture: Secondary | ICD-10-CM | POA: Diagnosis not present

## 2020-02-04 DIAGNOSIS — F17210 Nicotine dependence, cigarettes, uncomplicated: Secondary | ICD-10-CM | POA: Insufficient documentation

## 2020-02-04 DIAGNOSIS — Z86711 Personal history of pulmonary embolism: Secondary | ICD-10-CM | POA: Diagnosis not present

## 2020-02-04 DIAGNOSIS — W01190A Fall on same level from slipping, tripping and stumbling with subsequent striking against furniture, initial encounter: Secondary | ICD-10-CM | POA: Diagnosis not present

## 2020-02-04 DIAGNOSIS — Y92019 Unspecified place in single-family (private) house as the place of occurrence of the external cause: Secondary | ICD-10-CM | POA: Diagnosis not present

## 2020-02-04 DIAGNOSIS — Y9301 Activity, walking, marching and hiking: Secondary | ICD-10-CM | POA: Insufficient documentation

## 2020-02-04 DIAGNOSIS — S62242A Displaced fracture of shaft of first metacarpal bone, left hand, initial encounter for closed fracture: Secondary | ICD-10-CM | POA: Diagnosis not present

## 2020-02-04 DIAGNOSIS — I10 Essential (primary) hypertension: Secondary | ICD-10-CM | POA: Diagnosis not present

## 2020-02-04 MED ORDER — CYCLOBENZAPRINE HCL 10 MG PO TABS: 10.0000 mg | ORAL_TABLET | Freq: Two times a day (BID) | ORAL | 0 refills | Status: DC | PRN

## 2020-02-04 NOTE — ED Notes (Signed)
Ortho tech notified of needs for ortho glass thumb spica

## 2020-02-04 NOTE — Discharge Instructions (Addendum)
You have been seen today for left hand pain. please read and follow all provided instructions. Return to the emergency room for worsening condition or new concerning symptoms.    The x-ray shows you have a broken bone in your hand.  1. Medications:  Prescription to your pharmacy for a muscle relaxer Flexeril.  This can make you drowsy so do not drive or work when taking it.  Continue usual home medications including your blood pressure medications!!!  Take medications as prescribed. Please review all of the medicines and only take them if you do not have an allergy to them.   2. Treatment: rest.  Wear the splint until you follow-up with the hand doctor.  Do not take it off.  Do not get it wet in the shower.  3. Follow Up:  Please follow up with the hand doctor Dr. Jeannie Fend.  Call his office first thing Monday morning to schedule a follow-up appointment.   It is also a possibility that you have an allergic reaction to any of the medicines that you have been prescribed - Everybody reacts differently to medications and while MOST people have no trouble with most medicines, you may have a reaction such as nausea, vomiting, rash, swelling, shortness of breath. If this is the case, please stop taking the medicine immediately and contact your physician.  ?

## 2020-02-04 NOTE — ED Provider Notes (Signed)
[facility] EMERGENCY DEPARTMENT Provider Note   CSN: [id number] Arrival date & time: 02/04/20  6213     History Chief Complaint  Patient presents with  . Hand Injury    Marvin Roberson is a 49 y.o. right hand dominant male with past medical history significant for ESRD on dialysis MWF, hypertension, CVA presents to emergency department today with chief complaint of acute onset of left hand pain after mechanical fall x8 hours prior to arrival.  Patient was getting up in the middle of the night to get a glass of water.  He tripped on a box of shoes and fell forward.  He hit his head on the dresser but denies loss of consciousness.  He tried to catch himself by grabbing onto the dresser with his left hand.  He had immediate dull aching throbbing pain in his left hand and wrist.  Pain is worse with movement.  He took Tylenol and stayed awake for the next 4 hours to make sure there were no signs of a significant head injury.  He eventually went back to sleep and when he woke up later this morning he had worsening pain and swelling of his left hand prompting him to come to the emergency department for further evaluation.  He rates the pain 7 out of 10 in severity. He denies fever, chills, headache, neck pain, visual changes, chest pain, shortness of breath, back pain, abdominal pain, nausea, vomiting, lower extremity edema, numbness, tingling.  Of note patient's last dialysis was Wednesday.  He is due to go today.  He is already called the dialysis center and has rescheduled to go tomorrow for a make-up session.  Patient did not take his blood pressure medication prior to arrival.  He states it makes him feel bad and he want to wait until he could eat with the medication.  Patient is not anticoagulated.  History provided by patient with additional history obtained from chart review.     Past Medical History:  Diagnosis Date  . Adrenal insufficiency ([place])   . Dyspnea    "when I have  too much fluid"  . ESRD on dialysis [facility]) since 1990s   "M,W,F; [address]" (11/27/2016)  . GERD (gastroesophageal reflux disease)   . High cholesterol   . History of blood transfusion   . Hypertension   . Kidney failure   . Renal insufficiency   . Stroke [facility]) 2016   decreased vision in his left eye/notes 11/27/2016    Patient Active Problem List   Diagnosis Date Noted  . Chronic nonintractable headache 01/28/2019  . Thrombocytopenia ([place]) 08/13/2018  . ESRD on dialysis ([place])   . ESRD (end stage renal disease) ([place]) 01/14/2018  . Hyperkalemia, diminished renal excretion 10/20/2017  . Post-operative complication 08/65/7846  . 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 ESRD on hemodialysis ([place])   . 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 Adrenal insufficiency ([place])   . Hypoglycemia due to insulin 11/27/2016  . Hypertension 11/27/2016  . History of CVA (cerebrovascular accident) 11/27/2016  . Monoclonal gammopathy of unknown significance (MGUS) 11/27/2016  . Bradycardia 02/26/2016  . Hypertensive cardiomyopathy ([place]) 01/22/2016  . Hypertensive kidney disease with ESRD (end-stage renal disease) ([place]) 01/22/2016  . Staphylococcus aureus bacteremia 01/22/2016  . Hydrocele 12/01/2015  . CAD (coronary artery disease) 07/24/2015  . 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 Anticoagulation goal of INR 2 to 3 07/05/2014  . Avascular necrosis of femoral head (  [place]) 07/05/2014  . Intestinal occlusion ([place]) 07/05/2014  . Weakness of both hips 07/05/2014  . Anemia, chronic disease 03/18/2014  . Hyponatremia 03/18/2014  . Retroperitoneal lymphadenopathy 03/18/2014  . Weight loss 03/03/2014  . Sepsis ([place]) 03/30/2013  . Migraine headache 10/11/2012  . Pulmonary embolism ([place]) 10/06/2012  . DVT of upper extremity (deep vein thrombosis) ([place]) 10/05/2012  . Cellulitis of left arm 10/04/2012    Past Surgical History:  Procedure Laterality Date  . ANGIOPLASTY Left  12/11/2016   Procedure: ANGIOPLASTY LEFT ARM & SUBCLAVIAN ARTERY;  Surgeon: Conrad Vista, MD;  Location: [facility];  Service: Vascular;  Laterality: Left;  . arm surgery Left 2016   "for aneurysm"  . AV FISTULA PLACEMENT    . FALSE ANEURYSM REPAIR Left 12/11/2016   Procedure: RESECTION  BRACHIAL ARTERY;  Surgeon: Conrad Alma, MD;  Location: [facility];  Service: Vascular;  Laterality: Left;  . FALSE ANEURYSM REPAIR Left 01/14/2018   Procedure: REPAIR FALSE ANEURYSM LEFT BRACHIAL ARTERY WITH SAPHENOUS VEIN  LEFT ARTERIOVENOUS GRAFT;  Surgeon: Rosetta Posner, MD;  Location: [facility];  Service: Vascular;  Laterality: Left;  . INSERTION OF DIALYSIS CATHETER Right    chest  . IR FLUORO GUIDE CV LINE RIGHT  11/17/2019  . THROMBECTOMY BRACHIAL ARTERY  12/11/2016   Procedure: THROMBECTOMY BRACHIAL ARTERY AND ULNAR;  Surgeon: Conrad [place], MD;  Location: [facility];  Service: Vascular;;  . THYROIDECTOMY, PARTIAL    . UPPER EXTREMITY ANGIOGRAM Left 12/11/2016   Procedure: LEFT ARM ANGIOGRAM;  Surgeon: Conrad Kunkle, MD;  Location: [facility];  Service: Vascular;  Laterality: Left;  Marland Kitchen VEIN HARVEST Left 01/14/2018   Procedure: VEIN HARVEST LEFT SAPHENOUS VEIN;  Surgeon: Rosetta Posner, MD;  Location: [facility];  Service: Vascular;  Laterality: Left;  . WOUND EXPLORATION Left 12/11/2016   Procedure: LEFT ARM BRACHIAL ARTERY WITH INTERPOSTIONAL GRAFT;  Surgeon: Conrad Wawona, MD;  [facility];  Service: Vascular;  Laterality: Left;       Family History  Problem Relation Age of Onset  . Hypertension Other   . Adrenal disorder Neg Hx     Social History   Tobacco Use  . Smoking status: Current Every Day Smoker    Packs/day: 0.50    Years: 20.00    Pack years: 10.00    Types: Cigarettes  . Smokeless tobacco: Never Used  . Tobacco comment: 1 pk every 2 days.  Substance Use Topics  . Alcohol use: Yes    Comment: occ  . Drug use: No    Home Medications Prior to Admission medications   Medication Sig Start Date End Date  Taking? Authorizing Provider  amLODipine (NORVASC) 10 MG tablet Take 10 mg by mouth daily. 01/07/19   [provider]  atorvastatin (LIPITOR) 40 MG tablet Take 40 mg by mouth daily.    [provider]  busPIRone (BUSPAR) 10 MG tablet Take 1 tablet (10 mg total) by mouth 2 (two) times daily. 02/18/19   Argentina Donovan, PA-C  calcium acetate (PHOSLO) 667 MG capsule TAKE 3 CAPSULES BY MOUTH THREE TIMES DAILY WITH MEALS AND 1 CAPSULE TWO TIMES DAILY WITH SNACKS 01/08/19   [provider]  carvedilol (COREG) 25 MG tablet Take 25 mg by mouth 2 (two) times daily with a meal.    [provider]  cyclobenzaprine (FLEXERIL) 10 MG tablet Take 1 tablet (10 mg total) by mouth 2 (two) times daily as needed for muscle spasms.  02/04/20   Jisell Majer E, PA-C  folic acid-vitamin b complex-vitamin c-selenium-zinc (DIALYVITE) 3 MG TABS tablet Take 1 tablet by mouth daily.    [provider]  hydrALAZINE (APRESOLINE) 100 MG tablet Take 100 mg by mouth 3 (three) times daily.    [provider]  hydrocortisone (CORTEF) 5 MG tablet TAKE 1 TABLET BY MOUTH TWICE A DAY Patient taking differently: Take 5 mg by mouth daily.  10/09/17   Charlott Rakes, MD  losartan (COZAAR) 100 MG tablet Take 100 mg by mouth daily.    [provider]  pantoprazole (PROTONIX) 40 MG tablet Take 40 mg by mouth daily. 11/22/18   [provider]    Allergies    Doxycycline, Codeine, Gabapentin, Lisinopril, Povidone-iodine, and Betadine [povidone iodine]  Review of Systems   Review of Systems  All other systems are reviewed and are negative for acute change except as noted in the HPI.   Physical Exam Updated Vital Signs BP (!) 205/133 (BP Location: Left Leg)   Pulse 69   Temp 97.7 F (36.5 C) (Oral)   Resp 16   Ht 5\' 8"  (1.727 m)   Wt 63.5 kg   SpO2 100%   BMI 21.29 kg/m   Physical Exam Vitals and nursing note reviewed.  Constitutional:      Appearance: He is  well-developed. He is not ill-appearing or toxic-appearing.  HENT:     Head: Normocephalic and atraumatic.     Comments: No tenderness to palpation of skull. No deformities or crepitus noted. No open wounds, abrasions or lacerations.    Nose: Nose normal.  Eyes:     General: No scleral icterus.       Right eye: No discharge.        Left eye: No discharge.     Conjunctiva/sclera: Conjunctivae normal.  Neck:     Vascular: No JVD.     Comments: Full ROM intact without spinous process TTP. No bony stepoffs or deformities, No paraspinous muscle TTP or muscle spasms. No rigidity or meningeal signs. No bruising, erythema, or swelling.  Cardiovascular:     Rate and Rhythm: Normal rate and regular rhythm.     Pulses: Normal pulses.     Heart sounds: Normal heart sounds.  Pulmonary:     Effort: Pulmonary effort is normal.     Breath sounds: Normal breath sounds.  Abdominal:     General: There is no distension.  Musculoskeletal:        General: Normal range of motion.     Cervical back: Normal range of motion.     Comments: left hand with tenderness to palpation of thenar eminence. + swelling. There is no joint effusion noted. Decreased ROM 2/2 pain. No erythema or warmth overlaying the joint. There is no anatomic snuff box tenderness. Normal sensation and motor function in the median, ulnar, and radial nerve distributions. 2+ radial pulse.  Full ROM of left elbow and shoulder.  No tenderness palpation of thoracic or lumbar spinous processes.  Full range of motion of back.  No step-off or deformity.  Skin:    General: Skin is warm and dry.  Neurological:     Mental Status: He is oriented to person, place, and time.     GCS: GCS eye subscore is 4. GCS verbal subscore is 5. GCS motor subscore is 6.     Comments: Fluent speech, no facial droop.  Speech is clear and goal oriented, follows commands CN III-XII intact, no facial droop  Normal strength in upper and lower extremities bilaterally  including dorsiflexion and plantar flexion, strong and equal grip strength Sensation normal to light and sharp touch Moves extremities without ataxia, coordination intact Normal finger to nose and rapid alternating movements Normal gait and balance   Psychiatric:        Behavior: Behavior normal.     ED Results / Procedures / Treatments   Labs (all labs ordered are listed, but only abnormal results are displayed) Labs Reviewed - No data to display  EKG None  Radiology DG Wrist Complete Left  Result Date: 02/04/2020 CLINICAL DATA:  Right wrist pain since a fall yesterday. Initial encounter. EXAM: LEFT WRIST - COMPLETE 3+ VIEW COMPARISON:  None. FINDINGS: The patient has an acute fracture through the proximal diaphysis of the first metacarpal. The distal shaft of the first metacarpal demonstrates very mild volar angulation. No other acute bony or joint abnormality is seen. Bones appear osteopenic. Soft tissue swelling about the thumb and extensive atherosclerosis are noted. IMPRESSION: Acute fracture of the first metacarpal as described above. Osteopenia. Atherosclerosis. Electronically Signed   By: Inge Rise M.D.   On: 02/04/2020 10:17   DG Hand Complete Left  Result Date: 02/04/2020 CLINICAL DATA:  49 year old male with fall and pain with swelling EXAM: LEFT HAND - COMPLETE 3+ VIEW COMPARISON:  None. FINDINGS: Comminuted fracture in the mid shaft of the first metacarpal. Associated soft tissue swelling. Osteopenia. No additional displaced fracture identified. Degenerative changes of the interphalangeal joints. IMPRESSION: Acute comminuted fracture in the midshaft of the first metacarpal with associated soft tissue swelling. Electronically Signed   By: Corrie Mckusick D.O.   On: 02/04/2020 10:16    Procedures Procedures (including critical care time)  Medications Ordered in ED Medications - No data to display  ED Course  I have reviewed the triage vital signs and the nursing  notes.  Pertinent labs & imaging results that were available during my care of the patient were reviewed by me and considered in my medical decision making (see chart for details).  Clinical Course as of Feb 04 1224  Fri Feb 04, 2020  0949 BP elevated in  tirage 205/133. Patient has home BP meds with him. I encouraged patient to take medication but he does not look how it makes him feel and prefers to wait until he eats breakfast so he can take with food.   [KA]    Clinical Course User Index [KA] Dalasia Predmore, Harley Hallmark, PA-C   MDM Rules/Calculators/A&P                      Patient seen and examined. Patient presents awake, alert, hemodynamically stable, afebrile, non toxic. Neuro exam is normal.  No signs of serious head or neck injuries.  He has swelling to left thenar eminence and tenderness to palpation.  No snuffbox tenderness. NVI distally.  No open wounds.  X-ray of hand and wrist viewed by me.  He has an acute comminuted x-ray midshaft of first metacarpal mild dorsal angulation. Case discussed with on call hand surgeon Dr. Jeannie Fend who reviewed images. He recommends thumb spica and follow up in clinic next week.  He was noted to have elevated blood pressure in triage.  He has not taken his medications yet this morning.  I encouraged him to take them however waited until the end of his visit because he want to eat a snack with occasions.  I discussed the importance of taking medications as prescribed.  No signs of hypertensive urgency, he states his pressure if typically "high." Findings and plan of care discussed with supervising physician Dr. Tamera Punt who agrees with plan of care. Patient has prescription for hydrocodone at home, will prescribe muscle relaxer.  He is aware not to drive or work when taking as they can make him drowsy.  Patient was able to reschedule his dialysis to have make-up session this weekend as he missed today while in the ED. The patient appears reasonably screened  and/or stabilized for discharge and I doubt any other medical condition or other [facility] requiring further screening, evaluation, or treatment in the ED at this time prior to discharge. The patient is safe for discharge with strict return precautions discussed.   Portions of this note were generated with [employer]. Dictation errors may occur despite best attempts at proofreading.   Final Clinical Impression(s) / ED Diagnoses Final diagnoses:  Closed nondisplaced fracture of shaft of first metacarpal bone of left hand, initial encounter    Rx / DC Orders ED Discharge Orders         Ordered    cyclobenzaprine (FLEXERIL) 10 MG tablet  2 times daily PRN     02/04/20 1152           Dayrin Stallone, Harley Hallmark, PA-C 02/04/20 1226    Malvin Johns, MD 02/05/20 [phone]

## 2020-02-04 NOTE — ED Triage Notes (Signed)
C/o left hand pain; s/p fall.

## 2020-02-04 NOTE — ED Notes (Signed)
Returned from XRAY

## 2020-02-04 NOTE — Progress Notes (Signed)
Orthopedic Tech Progress Note Patient Details:  Marvin Roberson Dec 12, 1970 [id number]  Ortho Devices Type of Ortho Device: Thumb spica splint Splint Material: Fiberglass Ortho Device/Splint Location: LUE Ortho Device/Splint Interventions: Ordered, Application   Post Interventions Patient Tolerated: Well Instructions Provided: Care of device   Janit Pagan 02/04/2020, 11:35 AM

## 2020-02-07 DIAGNOSIS — D631 Anemia in chronic kidney disease: Secondary | ICD-10-CM | POA: Diagnosis not present

## 2020-02-07 DIAGNOSIS — E875 Hyperkalemia: Secondary | ICD-10-CM | POA: Diagnosis not present

## 2020-02-07 DIAGNOSIS — Z992 Dependence on renal dialysis: Secondary | ICD-10-CM | POA: Diagnosis not present

## 2020-02-07 DIAGNOSIS — N2581 Secondary hyperparathyroidism of renal origin: Secondary | ICD-10-CM | POA: Diagnosis not present

## 2020-02-07 DIAGNOSIS — N186 End stage renal disease: Secondary | ICD-10-CM | POA: Diagnosis not present

## 2020-02-09 DIAGNOSIS — N186 End stage renal disease: Secondary | ICD-10-CM | POA: Diagnosis not present

## 2020-02-09 DIAGNOSIS — D631 Anemia in chronic kidney disease: Secondary | ICD-10-CM | POA: Diagnosis not present

## 2020-02-09 DIAGNOSIS — Z992 Dependence on renal dialysis: Secondary | ICD-10-CM | POA: Diagnosis not present

## 2020-02-09 DIAGNOSIS — N2581 Secondary hyperparathyroidism of renal origin: Secondary | ICD-10-CM | POA: Diagnosis not present

## 2020-02-09 DIAGNOSIS — E875 Hyperkalemia: Secondary | ICD-10-CM | POA: Diagnosis not present

## 2020-02-10 DIAGNOSIS — S62242A Displaced fracture of shaft of first metacarpal bone, left hand, initial encounter for closed fracture: Secondary | ICD-10-CM | POA: Diagnosis not present

## 2020-02-10 DIAGNOSIS — M79642 Pain in left hand: Secondary | ICD-10-CM | POA: Insufficient documentation

## 2020-02-11 DIAGNOSIS — N2581 Secondary hyperparathyroidism of renal origin: Secondary | ICD-10-CM | POA: Diagnosis not present

## 2020-02-11 DIAGNOSIS — N186 End stage renal disease: Secondary | ICD-10-CM | POA: Diagnosis not present

## 2020-02-11 DIAGNOSIS — E875 Hyperkalemia: Secondary | ICD-10-CM | POA: Diagnosis not present

## 2020-02-11 DIAGNOSIS — S62309A Unspecified fracture of unspecified metacarpal bone, initial encounter for closed fracture: Secondary | ICD-10-CM | POA: Insufficient documentation

## 2020-02-11 DIAGNOSIS — Z992 Dependence on renal dialysis: Secondary | ICD-10-CM | POA: Diagnosis not present

## 2020-02-11 DIAGNOSIS — D631 Anemia in chronic kidney disease: Secondary | ICD-10-CM | POA: Diagnosis not present

## 2020-02-14 DIAGNOSIS — N186 End stage renal disease: Secondary | ICD-10-CM | POA: Diagnosis not present

## 2020-02-14 DIAGNOSIS — D631 Anemia in chronic kidney disease: Secondary | ICD-10-CM | POA: Diagnosis not present

## 2020-02-14 DIAGNOSIS — N2581 Secondary hyperparathyroidism of renal origin: Secondary | ICD-10-CM | POA: Diagnosis not present

## 2020-02-14 DIAGNOSIS — Z992 Dependence on renal dialysis: Secondary | ICD-10-CM | POA: Diagnosis not present

## 2020-02-14 DIAGNOSIS — E875 Hyperkalemia: Secondary | ICD-10-CM | POA: Diagnosis not present

## 2020-02-16 DIAGNOSIS — Z992 Dependence on renal dialysis: Secondary | ICD-10-CM | POA: Diagnosis not present

## 2020-02-16 DIAGNOSIS — E875 Hyperkalemia: Secondary | ICD-10-CM | POA: Diagnosis not present

## 2020-02-16 DIAGNOSIS — N186 End stage renal disease: Secondary | ICD-10-CM | POA: Diagnosis not present

## 2020-02-16 DIAGNOSIS — N2581 Secondary hyperparathyroidism of renal origin: Secondary | ICD-10-CM | POA: Diagnosis not present

## 2020-02-16 DIAGNOSIS — D631 Anemia in chronic kidney disease: Secondary | ICD-10-CM | POA: Diagnosis not present

## 2020-02-18 DIAGNOSIS — D631 Anemia in chronic kidney disease: Secondary | ICD-10-CM | POA: Diagnosis not present

## 2020-02-18 DIAGNOSIS — N2581 Secondary hyperparathyroidism of renal origin: Secondary | ICD-10-CM | POA: Diagnosis not present

## 2020-02-18 DIAGNOSIS — E875 Hyperkalemia: Secondary | ICD-10-CM | POA: Diagnosis not present

## 2020-02-18 DIAGNOSIS — Z992 Dependence on renal dialysis: Secondary | ICD-10-CM | POA: Diagnosis not present

## 2020-02-18 DIAGNOSIS — N186 End stage renal disease: Secondary | ICD-10-CM | POA: Diagnosis not present

## 2020-02-21 DIAGNOSIS — Z992 Dependence on renal dialysis: Secondary | ICD-10-CM | POA: Diagnosis not present

## 2020-02-21 DIAGNOSIS — E875 Hyperkalemia: Secondary | ICD-10-CM | POA: Diagnosis not present

## 2020-02-21 DIAGNOSIS — N186 End stage renal disease: Secondary | ICD-10-CM | POA: Diagnosis not present

## 2020-02-21 DIAGNOSIS — N2581 Secondary hyperparathyroidism of renal origin: Secondary | ICD-10-CM | POA: Diagnosis not present

## 2020-02-21 DIAGNOSIS — D631 Anemia in chronic kidney disease: Secondary | ICD-10-CM | POA: Diagnosis not present

## 2020-02-22 DIAGNOSIS — S62242A Displaced fracture of shaft of first metacarpal bone, left hand, initial encounter for closed fracture: Secondary | ICD-10-CM | POA: Diagnosis not present

## 2020-02-23 DIAGNOSIS — D631 Anemia in chronic kidney disease: Secondary | ICD-10-CM | POA: Diagnosis not present

## 2020-02-23 DIAGNOSIS — N2581 Secondary hyperparathyroidism of renal origin: Secondary | ICD-10-CM | POA: Diagnosis not present

## 2020-02-23 DIAGNOSIS — Z992 Dependence on renal dialysis: Secondary | ICD-10-CM | POA: Diagnosis not present

## 2020-02-23 DIAGNOSIS — N186 End stage renal disease: Secondary | ICD-10-CM | POA: Diagnosis not present

## 2020-02-23 DIAGNOSIS — E875 Hyperkalemia: Secondary | ICD-10-CM | POA: Diagnosis not present

## 2020-02-25 DIAGNOSIS — N186 End stage renal disease: Secondary | ICD-10-CM | POA: Diagnosis not present

## 2020-02-25 DIAGNOSIS — Z992 Dependence on renal dialysis: Secondary | ICD-10-CM | POA: Diagnosis not present

## 2020-02-25 DIAGNOSIS — E875 Hyperkalemia: Secondary | ICD-10-CM | POA: Diagnosis not present

## 2020-02-25 DIAGNOSIS — D631 Anemia in chronic kidney disease: Secondary | ICD-10-CM | POA: Diagnosis not present

## 2020-02-25 DIAGNOSIS — N2581 Secondary hyperparathyroidism of renal origin: Secondary | ICD-10-CM | POA: Diagnosis not present

## 2020-02-28 DIAGNOSIS — E875 Hyperkalemia: Secondary | ICD-10-CM | POA: Diagnosis not present

## 2020-02-28 DIAGNOSIS — D631 Anemia in chronic kidney disease: Secondary | ICD-10-CM | POA: Diagnosis not present

## 2020-02-28 DIAGNOSIS — N186 End stage renal disease: Secondary | ICD-10-CM | POA: Diagnosis not present

## 2020-02-28 DIAGNOSIS — Z992 Dependence on renal dialysis: Secondary | ICD-10-CM | POA: Diagnosis not present

## 2020-02-28 DIAGNOSIS — N2581 Secondary hyperparathyroidism of renal origin: Secondary | ICD-10-CM | POA: Diagnosis not present

## 2020-03-01 DIAGNOSIS — E875 Hyperkalemia: Secondary | ICD-10-CM | POA: Diagnosis not present

## 2020-03-01 DIAGNOSIS — D631 Anemia in chronic kidney disease: Secondary | ICD-10-CM | POA: Diagnosis not present

## 2020-03-01 DIAGNOSIS — Z992 Dependence on renal dialysis: Secondary | ICD-10-CM | POA: Diagnosis not present

## 2020-03-01 DIAGNOSIS — N2581 Secondary hyperparathyroidism of renal origin: Secondary | ICD-10-CM | POA: Diagnosis not present

## 2020-03-01 DIAGNOSIS — N186 End stage renal disease: Secondary | ICD-10-CM | POA: Diagnosis not present

## 2020-03-03 DIAGNOSIS — N186 End stage renal disease: Secondary | ICD-10-CM | POA: Diagnosis not present

## 2020-03-03 DIAGNOSIS — Z992 Dependence on renal dialysis: Secondary | ICD-10-CM | POA: Diagnosis not present

## 2020-03-03 DIAGNOSIS — D631 Anemia in chronic kidney disease: Secondary | ICD-10-CM | POA: Diagnosis not present

## 2020-03-03 DIAGNOSIS — N2581 Secondary hyperparathyroidism of renal origin: Secondary | ICD-10-CM | POA: Diagnosis not present

## 2020-03-03 DIAGNOSIS — E875 Hyperkalemia: Secondary | ICD-10-CM | POA: Diagnosis not present

## 2020-03-04 DIAGNOSIS — I129 Hypertensive chronic kidney disease with stage 1 through stage 4 chronic kidney disease, or unspecified chronic kidney disease: Secondary | ICD-10-CM | POA: Diagnosis not present

## 2020-03-04 DIAGNOSIS — N186 End stage renal disease: Secondary | ICD-10-CM | POA: Diagnosis not present

## 2020-03-04 DIAGNOSIS — Z992 Dependence on renal dialysis: Secondary | ICD-10-CM | POA: Diagnosis not present

## 2020-03-06 ENCOUNTER — Encounter: Payer: Self-pay | Admitting: Gastroenterology

## 2020-03-06 ENCOUNTER — Encounter: Payer: Self-pay | Admitting: *Deleted

## 2020-03-06 ENCOUNTER — Other Ambulatory Visit: Payer: Self-pay | Admitting: Family

## 2020-03-06 ENCOUNTER — Other Ambulatory Visit (HOSPITAL_COMMUNITY): Payer: Self-pay | Admitting: Family

## 2020-03-06 DIAGNOSIS — Z992 Dependence on renal dialysis: Secondary | ICD-10-CM | POA: Diagnosis not present

## 2020-03-06 DIAGNOSIS — N186 End stage renal disease: Secondary | ICD-10-CM | POA: Diagnosis not present

## 2020-03-06 DIAGNOSIS — D631 Anemia in chronic kidney disease: Secondary | ICD-10-CM | POA: Diagnosis not present

## 2020-03-06 DIAGNOSIS — E875 Hyperkalemia: Secondary | ICD-10-CM | POA: Diagnosis not present

## 2020-03-06 DIAGNOSIS — N2581 Secondary hyperparathyroidism of renal origin: Secondary | ICD-10-CM | POA: Diagnosis not present

## 2020-03-06 DIAGNOSIS — C499 Malignant neoplasm of connective and soft tissue, unspecified: Secondary | ICD-10-CM

## 2020-03-07 DIAGNOSIS — S62242A Displaced fracture of shaft of first metacarpal bone, left hand, initial encounter for closed fracture: Secondary | ICD-10-CM | POA: Diagnosis not present

## 2020-03-09 ENCOUNTER — Ambulatory Visit (INDEPENDENT_AMBULATORY_CARE_PROVIDER_SITE_OTHER): Payer: Medicare Other | Admitting: Gastroenterology

## 2020-03-09 ENCOUNTER — Encounter: Payer: Self-pay | Admitting: Gastroenterology

## 2020-03-09 ENCOUNTER — Other Ambulatory Visit (INDEPENDENT_AMBULATORY_CARE_PROVIDER_SITE_OTHER): Payer: Medicare Other

## 2020-03-09 VITALS — BP 140/60 | HR 72 | Temp 98.5°F | Ht 68.0 in | Wt 131.0 lb

## 2020-03-09 DIAGNOSIS — K7460 Unspecified cirrhosis of liver: Secondary | ICD-10-CM

## 2020-03-09 DIAGNOSIS — Z01818 Encounter for other preprocedural examination: Secondary | ICD-10-CM | POA: Diagnosis not present

## 2020-03-09 DIAGNOSIS — Z1211 Encounter for screening for malignant neoplasm of colon: Secondary | ICD-10-CM

## 2020-03-09 LAB — PROTIME-INR
INR: 1.1 ratio — ABNORMAL HIGH (ref 0.8–1.0)
Prothrombin Time: 12.8 s (ref 9.6–13.1)

## 2020-03-09 LAB — HEPATIC FUNCTION PANEL
ALT: 12 U/L (ref 0–53)
AST: 25 U/L (ref 0–37)
Albumin: 3.8 g/dL (ref 3.5–5.2)
Alkaline Phosphatase: 128 U/L — ABNORMAL HIGH (ref 39–117)
Bilirubin, Direct: 0.1 mg/dL (ref 0.0–0.3)
Total Bilirubin: 0.5 mg/dL (ref 0.2–1.2)
Total Protein: 8.9 g/dL — ABNORMAL HIGH (ref 6.0–8.3)

## 2020-03-09 LAB — CBC WITH DIFFERENTIAL/PLATELET
Basophils Absolute: 0.1 10*3/uL (ref 0.0–0.1)
Basophils Relative: 1 % (ref 0.0–3.0)
Eosinophils Absolute: 0.4 10*3/uL (ref 0.0–0.7)
Eosinophils Relative: 8.1 % — ABNORMAL HIGH (ref 0.0–5.0)
HCT: 29.7 % — ABNORMAL LOW (ref 39.0–52.0)
Hemoglobin: 9.9 g/dL — ABNORMAL LOW (ref 13.0–17.0)
Lymphocytes Relative: 21.1 % (ref 12.0–46.0)
Lymphs Abs: 1.1 10*3/uL (ref 0.7–4.0)
MCHC: 33.4 g/dL (ref 30.0–36.0)
MCV: 90.2 fl (ref 78.0–100.0)
Monocytes Absolute: 0.7 10*3/uL (ref 0.1–1.0)
Monocytes Relative: 12.6 % — ABNORMAL HIGH (ref 3.0–12.0)
Neutro Abs: 3 10*3/uL (ref 1.4–7.7)
Neutrophils Relative %: 57.2 % (ref 43.0–77.0)
Platelets: 129 10*3/uL — ABNORMAL LOW (ref 150.0–400.0)
RBC: 3.29 Mil/uL — ABNORMAL LOW (ref 4.22–5.81)
RDW: 19.1 % — ABNORMAL HIGH (ref 11.5–15.5)
WBC: 5.2 10*3/uL (ref 4.0–10.5)

## 2020-03-09 LAB — BASIC METABOLIC PANEL
BUN: 57 mg/dL — ABNORMAL HIGH (ref 6–23)
CO2: 28 mEq/L (ref 19–32)
Calcium: 8.9 mg/dL (ref 8.4–10.5)
Chloride: 97 mEq/L (ref 96–112)
Creatinine, Ser: 12.4 mg/dL (ref 0.40–1.50)
GFR: 5.29 mL/min — CL (ref >60.00)
Glucose, Bld: 73 mg/dL (ref 70–99)
Potassium: 5.5 mEq/L — ABNORMAL HIGH (ref 3.5–5.1)
Sodium: 133 mEq/L — ABNORMAL LOW (ref 135–145)

## 2020-03-09 LAB — IGA: IgA: 405 mg/dL — ABNORMAL HIGH (ref 68–378)

## 2020-03-09 LAB — IBC + FERRITIN
Ferritin: 1329 ng/mL — ABNORMAL HIGH (ref 22.0–322.0)
Iron: 29 ug/dL — ABNORMAL LOW (ref 42–165)
Saturation Ratios: 19.5 % — ABNORMAL LOW (ref 20.0–50.0)
Transferrin: 106 mg/dL — ABNORMAL LOW (ref 212.0–360.0)

## 2020-03-09 MED ORDER — SUTAB 1479-225-188 MG PO TABS
1.0000 | ORAL_TABLET | Freq: Once | ORAL | 0 refills | Status: AC
Start: 2020-03-09 — End: 2020-03-09

## 2020-03-09 NOTE — Progress Notes (Signed)
Attending Physician's Attestation   I have reviewed the chart.   I agree with the Advanced Practitioner's note, impression, and recommendations with any updates as below.  Proceed with endoscopic evaluation as detailed.  If unable to get MRI/MRCP even with some anxiolytic, would recommend repeat imaging at 2-3 months before proceeding with biopsy.  Justice Britain, MD [facility] Advanced Endoscopy Office # [phone]

## 2020-03-09 NOTE — Patient Instructions (Addendum)
If you are age 48 or older, your body mass index should be between 23-30. Your Body mass index is 19.92 kg/m. If this is out of the aforementioned range listed, please consider follow up with your Primary Care Provider.  If you are age 66 or younger, your body mass index should be between 19-25. Your Body mass index is 19.92 kg/m. If this is out of the aformentioned range listed, please consider follow up with your Primary Care Provider.   Please go to the lab in the basement of our building to have lab work done as you leave today. Hit "B" for basement when you get on the elevator.  When the doors open the lab is on your left.  We will call you with the results. Thank you.  Due to recent changes in healthcare laws, you may see the results of your imaging and laboratory studies on MyChart before your provider has had a chance to review them.  We understand that in some cases there may be results that are confusing or concerning to you. Not all laboratory results come back in the same time frame and the provider may be waiting for multiple results in order to interpret others.  Please give [place] 48 hours in order for your provider to thoroughly review all the results before contacting the office for clarification of your results.   You have been scheduled for an endoscopy and colonoscopy. Please follow the written instructions given to you at your visit today. Please pick up your prep supplies at the pharmacy within the next 1-3 days. If you use inhalers (even only as needed), please bring them with you on the day of your procedure.  Please purchase the following medications over the counter and take as directed: IBgard: Take are directed (we are giving you a coupon today)    Thank you for entrusting me with your care and for choosing [employer].

## 2020-03-09 NOTE — Progress Notes (Signed)
03/09/2020 Marvin Roberson [id number] 1970-12-22   HISTORY OF PRESENT ILLNESS: This is a pleasant 49 year old male who is new to our office.  He has been referred here by Dustin Folks, FNP, for evaluation regarding finding of cirrhosis on recent imaging study that was performed for complaints of abdominal pain.  CT scan of the abdomen and pelvis without contrast showed the following:  IMPRESSION: 1. Mild diffuse thickening of the colon possibly related to hepatic colopathy. Colitis is not excluded. Clinical correlation is recommended. No bowel obstruction. Normal appendix. 2. Enlargement of the retroperitoneal fat with increased stranding associated mass effect on the retroperitoneal structures. 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 Other etiologies including liposarcoma is not excluded. MRI may provide better evaluation. 3. Enlarged bilateral inguinal lymph nodes. 4. Atrophic kidneys. Nonobstructing bilateral renal calculi. No hydronephrosis. 5. Cirrhosis with small ascites. 6. Aortic Atherosclerosis (ICD10-I70.0).  He tells me that the abdominal pain he has been experiencing is described as a pressure or gas sensation.  He is asking what he can take to help with that.  He says that in regards to his bowel habits he sometimes has some very mild constipation, but moves his bowels regularly for the most part.  Denies any rectal bleeding.  He says that this morning he had a black bowel movement, but admits that he did take some Pepto-Bismol yesterday.  He is very surprised about this finding of cirrhosis.  He says that he does not drink alcohol.  No family history of liver disease to his knowledge.  I do not have any lab results.    Past Medical History:  Diagnosis Date  . Adrenal insufficiency ([place])   . Dyspnea    "when I have too much fluid"  . ESRD on dialysis [facility]) since 1990s   "M,W,F; [address]" (11/27/2016)  . GERD (gastroesophageal  reflux disease)   . High cholesterol   . History of blood transfusion   . Hypertension   . Kidney failure   . Renal insufficiency   . Stroke [facility]) 2016   decreased vision in his left eye/notes 11/27/2016   Past Surgical History:  Procedure Laterality Date  . ANGIOPLASTY Left 12/11/2016   Procedure: ANGIOPLASTY LEFT ARM & SUBCLAVIAN ARTERY;  Surgeon: Conrad [place], MD;  Location: [place];  Service: Vascular;  Laterality: Left;  . arm surgery Left 2016   "for aneurysm"  . AV FISTULA PLACEMENT    . FALSE ANEURYSM REPAIR Left 12/11/2016   Procedure: RESECTION  BRACHIAL ARTERY;  Surgeon: Conrad [place], MD;  Location: [place];  Service: Vascular;  Laterality: Left;  . FALSE ANEURYSM REPAIR Left 01/14/2018   Procedure: REPAIR FALSE ANEURYSM LEFT BRACHIAL ARTERY WITH SAPHENOUS VEIN  LEFT ARTERIOVENOUS GRAFT;  Surgeon: Rosetta Posner, MD;  Location: [place];  Service: Vascular;  Laterality: Left;  . INSERTION OF DIALYSIS CATHETER Right    chest  . IR FLUORO GUIDE CV LINE RIGHT  11/17/2019  . THROMBECTOMY BRACHIAL ARTERY  12/11/2016   Procedure: THROMBECTOMY BRACHIAL ARTERY AND ULNAR;  Surgeon: Conrad Farr[place], MD;  Location: [place];  Service: Vascular;;  . THYROIDECTOMY, PARTIAL    . UPPER EXTREMITY ANGIOGRAM Left 12/11/2016   Procedure: LEFT ARM ANGIOGRAM;  Surgeon: Conrad [place], MD;  Location: [place];  Service: Vascular;  Laterality: Left;  Marland Kitchen VEIN HARVEST Left 01/14/2018   Procedure: VEIN HARVEST LEFT SAPHENOUS VEIN;  Surgeon: Rosetta Posner, MD;  Location: [place];  Service: Vascular;  Laterality: Left;  . WOUND EXPLORATION Left 12/11/2016   Procedure: LEFT ARM BRACHIAL ARTERY WITH INTERPOSTIONAL GRAFT;  Surgeon: Conrad Tome, MD;  Location: [place];  Service: Vascular;  Laterality: Left;    reports that he has been smoking cigarettes. He has a 10.00 pack-year smoking history. He has never used smokeless tobacco. He reports that he does not drink alcohol or use drugs. family history includes Cancer in his brother;  Hypertension in an other family member. Allergies  Allergen Reactions  . Doxycycline Shortness Of Breath  . Codeine Nausea And Vomiting  . Gabapentin Other (See Comments)    Sleepiness, altered mental state  . Lisinopril Other (See Comments) and Swelling    angioedema  . Povidone-Iodine Itching  . Betadine [Povidone Iodine] Rash      Outpatient Encounter Medications as of 03/09/2020  Medication Sig  . amLODipine (NORVASC) 10 MG tablet Take 10 mg by mouth daily.  . calcium acetate (PHOSLO) 667 MG capsule TAKE 3 CAPSULES BY MOUTH THREE TIMES DAILY WITH MEALS AND 1 CAPSULE TWO TIMES DAILY WITH SNACKS  . carvedilol (COREG) 25 MG tablet Take 25 mg by mouth 2 (two) times daily with a meal.  . folic acid-vitamin b complex-vitamin c-selenium-zinc (DIALYVITE) 3 MG TABS tablet Take 1 tablet by mouth daily.  . hydrALAZINE (APRESOLINE) 100 MG tablet Take 100 mg by mouth 3 (three) times daily.  . hydrocortisone (CORTEF) 5 MG tablet TAKE 1 TABLET BY MOUTH TWICE A DAY (Patient taking differently: Take 5 mg by mouth daily. )  . montelukast (SINGULAIR) 10 MG tablet Take 10 mg by mouth at bedtime.  . pantoprazole (PROTONIX) 40 MG tablet Take 40 mg by mouth daily.  . [DISCONTINUED] atorvastatin (LIPITOR) 40 MG tablet Take 40 mg by mouth daily.  . [DISCONTINUED] busPIRone (BUSPAR) 10 MG tablet Take 1 tablet (10 mg total) by mouth 2 (two) times daily.  . [DISCONTINUED] cyclobenzaprine (FLEXERIL) 10 MG tablet Take 1 tablet (10 mg total) by mouth 2 (two) times daily as needed for muscle spasms.  . [DISCONTINUED] losartan (COZAAR) 100 MG tablet Take 100 mg by mouth daily.   No facility-administered encounter medications on file as of 03/09/2020.     REVIEW OF SYSTEMS  : All other systems reviewed and negative except where noted in the History of Present Illness.   PHYSICAL EXAM: BP 140/60   Pulse 72   Temp 98.5 F (36.9 C)   Ht 5\' 8"  (1.727 m)   Wt 131 lb (59.4 kg)   BMI 19.92 kg/m  General: Well  developed AA male in no acute distress Head: Normocephalic and atraumatic Eyes:  Sclerae anicteric, conjunctiva pink. Ears: Normal auditory acuity Lungs: Clear throughout to auscultation; no increased WOB. Heart: Regular rate and rhythm; no M/R/G. Abdomen: Soft, non-distended.  BS present.  Non-tender. Rectal:  Will be done at the time of colonoscopy. Musculoskeletal: Symmetrical with no gross deformities  Skin: No lesions on visible extremities Extremities: No edema  Neurological: Alert oriented x 4, grossly non-focal Psychological:  Alert and cooperative. Normal mood and affect  ASSESSMENT AND PLAN: *Cirrhosis, newly diagnosed incidental when seen on CT scan for evaluation of abdominal pain:  Appears well compensated at this point.  Patient denies alcohol use.  No family history of liver disease to his knowledge.  We will plan for a routine extensive serologic evaluation to rule out other causes of chronic liver disease.  We will check an AFP level as well as a PT/INR, CBC, BMP, hepatic  function panel.  We will plan for EGD with esophageal varices screening as well. *CRC screening:  Will proceed with colonoscopy with Dr. Rush Landmark. 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  PCP trying for MRI, but patient is extremely claustrophobic.  Will let PCP address this for now, ?  If repeat CT in a few months would be helpful if he is not able to do MRI or if some type of biopsy would be able to be performed. *Abdominal pain: Generalized.  Described as a gas or pressure sensation.  Recommended trying Gas-X or IBgard.  Question if this is at all related to the abnormal CT scan findings stated above.  **The risks, benefits, and alternatives to EGD and  colonoscopy were discussed with the patient and he consents to proceed.   CC:  Charlott Rakes, MD

## 2020-03-10 DIAGNOSIS — D631 Anemia in chronic kidney disease: Secondary | ICD-10-CM | POA: Diagnosis not present

## 2020-03-10 DIAGNOSIS — E875 Hyperkalemia: Secondary | ICD-10-CM | POA: Diagnosis not present

## 2020-03-10 DIAGNOSIS — N2581 Secondary hyperparathyroidism of renal origin: Secondary | ICD-10-CM | POA: Diagnosis not present

## 2020-03-10 DIAGNOSIS — N186 End stage renal disease: Secondary | ICD-10-CM | POA: Diagnosis not present

## 2020-03-10 DIAGNOSIS — Z992 Dependence on renal dialysis: Secondary | ICD-10-CM | POA: Diagnosis not present

## 2020-03-13 DIAGNOSIS — D631 Anemia in chronic kidney disease: Secondary | ICD-10-CM | POA: Diagnosis not present

## 2020-03-13 DIAGNOSIS — Z992 Dependence on renal dialysis: Secondary | ICD-10-CM | POA: Diagnosis not present

## 2020-03-13 DIAGNOSIS — N2581 Secondary hyperparathyroidism of renal origin: Secondary | ICD-10-CM | POA: Diagnosis not present

## 2020-03-13 DIAGNOSIS — E875 Hyperkalemia: Secondary | ICD-10-CM | POA: Diagnosis not present

## 2020-03-13 DIAGNOSIS — N186 End stage renal disease: Secondary | ICD-10-CM | POA: Diagnosis not present

## 2020-03-14 LAB — TISSUE TRANSGLUTAMINASE, IGA: (tTG) Ab, IgA: 1 U/mL

## 2020-03-14 LAB — IGG: IgG (Immunoglobin G), Serum: 3150 mg/dL — ABNORMAL HIGH (ref 600–1640)

## 2020-03-14 LAB — ANTI-NUCLEAR AB-TITER (ANA TITER)
ANA TITER: 1:40 {titer} — ABNORMAL HIGH
ANA Titer 1: 1:80 {titer} — ABNORMAL HIGH

## 2020-03-14 LAB — AFP TUMOR MARKER: AFP-Tumor Marker: 1.7 ng/mL (ref <6.1)

## 2020-03-14 LAB — HEPATITIS B CORE ANTIBODY, TOTAL: Hep B Core Total Ab: NONREACTIVE

## 2020-03-14 LAB — HEPATITIS B SURFACE ANTIBODY,QUALITATIVE: Hep B S Ab: REACTIVE — AB

## 2020-03-14 LAB — HEPATITIS A ANTIBODY, TOTAL: Hepatitis A AB,Total: NONREACTIVE

## 2020-03-14 LAB — CERULOPLASMIN: Ceruloplasmin: 49 mg/dL — ABNORMAL HIGH (ref 18–36)

## 2020-03-14 LAB — ANA: Anti Nuclear Antibody (ANA): POSITIVE — AB

## 2020-03-14 LAB — ANTI-SMOOTH MUSCLE ANTIBODY, IGG: Actin (Smooth Muscle) Antibody (IGG): <20 U (ref <20)

## 2020-03-14 LAB — MITOCHONDRIAL ANTIBODIES: Mitochondrial M2 Ab, IgG: <=20 U

## 2020-03-14 LAB — ALPHA-1-ANTITRYPSIN: A-1 Antitrypsin, Ser: 204 mg/dL — ABNORMAL HIGH (ref 83–199)

## 2020-03-14 LAB — HEPATITIS B SURFACE ANTIGEN: Hepatitis B Surface Ag: NONREACTIVE

## 2020-03-15 DIAGNOSIS — Z992 Dependence on renal dialysis: Secondary | ICD-10-CM | POA: Diagnosis not present

## 2020-03-15 DIAGNOSIS — E875 Hyperkalemia: Secondary | ICD-10-CM | POA: Diagnosis not present

## 2020-03-15 DIAGNOSIS — N2581 Secondary hyperparathyroidism of renal origin: Secondary | ICD-10-CM | POA: Diagnosis not present

## 2020-03-15 DIAGNOSIS — N186 End stage renal disease: Secondary | ICD-10-CM | POA: Diagnosis not present

## 2020-03-15 DIAGNOSIS — D631 Anemia in chronic kidney disease: Secondary | ICD-10-CM | POA: Diagnosis not present

## 2020-03-17 DIAGNOSIS — E875 Hyperkalemia: Secondary | ICD-10-CM | POA: Diagnosis not present

## 2020-03-17 DIAGNOSIS — N186 End stage renal disease: Secondary | ICD-10-CM | POA: Diagnosis not present

## 2020-03-17 DIAGNOSIS — N2581 Secondary hyperparathyroidism of renal origin: Secondary | ICD-10-CM | POA: Diagnosis not present

## 2020-03-17 DIAGNOSIS — Z992 Dependence on renal dialysis: Secondary | ICD-10-CM | POA: Diagnosis not present

## 2020-03-17 DIAGNOSIS — D631 Anemia in chronic kidney disease: Secondary | ICD-10-CM | POA: Diagnosis not present

## 2020-03-20 DIAGNOSIS — Z992 Dependence on renal dialysis: Secondary | ICD-10-CM | POA: Diagnosis not present

## 2020-03-20 DIAGNOSIS — E875 Hyperkalemia: Secondary | ICD-10-CM | POA: Diagnosis not present

## 2020-03-20 DIAGNOSIS — N2581 Secondary hyperparathyroidism of renal origin: Secondary | ICD-10-CM | POA: Diagnosis not present

## 2020-03-20 DIAGNOSIS — D631 Anemia in chronic kidney disease: Secondary | ICD-10-CM | POA: Diagnosis not present

## 2020-03-20 DIAGNOSIS — N186 End stage renal disease: Secondary | ICD-10-CM | POA: Diagnosis not present

## 2020-03-21 ENCOUNTER — Other Ambulatory Visit: Payer: Self-pay

## 2020-03-21 DIAGNOSIS — K7460 Unspecified cirrhosis of liver: Secondary | ICD-10-CM

## 2020-03-21 DIAGNOSIS — S62242A Displaced fracture of shaft of first metacarpal bone, left hand, initial encounter for closed fracture: Secondary | ICD-10-CM | POA: Diagnosis not present

## 2020-03-22 DIAGNOSIS — N186 End stage renal disease: Secondary | ICD-10-CM | POA: Diagnosis not present

## 2020-03-22 DIAGNOSIS — Z992 Dependence on renal dialysis: Secondary | ICD-10-CM | POA: Diagnosis not present

## 2020-03-22 DIAGNOSIS — D631 Anemia in chronic kidney disease: Secondary | ICD-10-CM | POA: Diagnosis not present

## 2020-03-22 DIAGNOSIS — N2581 Secondary hyperparathyroidism of renal origin: Secondary | ICD-10-CM | POA: Diagnosis not present

## 2020-03-22 DIAGNOSIS — E875 Hyperkalemia: Secondary | ICD-10-CM | POA: Diagnosis not present

## 2020-03-24 DIAGNOSIS — E875 Hyperkalemia: Secondary | ICD-10-CM | POA: Diagnosis not present

## 2020-03-24 DIAGNOSIS — Z992 Dependence on renal dialysis: Secondary | ICD-10-CM | POA: Diagnosis not present

## 2020-03-24 DIAGNOSIS — N2581 Secondary hyperparathyroidism of renal origin: Secondary | ICD-10-CM | POA: Diagnosis not present

## 2020-03-24 DIAGNOSIS — D631 Anemia in chronic kidney disease: Secondary | ICD-10-CM | POA: Diagnosis not present

## 2020-03-24 DIAGNOSIS — N186 End stage renal disease: Secondary | ICD-10-CM | POA: Diagnosis not present

## 2020-03-27 DIAGNOSIS — D631 Anemia in chronic kidney disease: Secondary | ICD-10-CM | POA: Diagnosis not present

## 2020-03-27 DIAGNOSIS — E875 Hyperkalemia: Secondary | ICD-10-CM | POA: Diagnosis not present

## 2020-03-27 DIAGNOSIS — Z992 Dependence on renal dialysis: Secondary | ICD-10-CM | POA: Diagnosis not present

## 2020-03-27 DIAGNOSIS — N2581 Secondary hyperparathyroidism of renal origin: Secondary | ICD-10-CM | POA: Diagnosis not present

## 2020-03-27 DIAGNOSIS — N186 End stage renal disease: Secondary | ICD-10-CM | POA: Diagnosis not present

## 2020-03-29 DIAGNOSIS — Z992 Dependence on renal dialysis: Secondary | ICD-10-CM | POA: Diagnosis not present

## 2020-03-29 DIAGNOSIS — N2581 Secondary hyperparathyroidism of renal origin: Secondary | ICD-10-CM | POA: Diagnosis not present

## 2020-03-29 DIAGNOSIS — D631 Anemia in chronic kidney disease: Secondary | ICD-10-CM | POA: Diagnosis not present

## 2020-03-29 DIAGNOSIS — N186 End stage renal disease: Secondary | ICD-10-CM | POA: Diagnosis not present

## 2020-03-29 DIAGNOSIS — E875 Hyperkalemia: Secondary | ICD-10-CM | POA: Diagnosis not present

## 2020-03-31 DIAGNOSIS — Z992 Dependence on renal dialysis: Secondary | ICD-10-CM | POA: Diagnosis not present

## 2020-03-31 DIAGNOSIS — N2581 Secondary hyperparathyroidism of renal origin: Secondary | ICD-10-CM | POA: Diagnosis not present

## 2020-03-31 DIAGNOSIS — E875 Hyperkalemia: Secondary | ICD-10-CM | POA: Diagnosis not present

## 2020-03-31 DIAGNOSIS — D631 Anemia in chronic kidney disease: Secondary | ICD-10-CM | POA: Diagnosis not present

## 2020-03-31 DIAGNOSIS — N186 End stage renal disease: Secondary | ICD-10-CM | POA: Diagnosis not present

## 2020-04-03 DIAGNOSIS — Z992 Dependence on renal dialysis: Secondary | ICD-10-CM | POA: Diagnosis not present

## 2020-04-03 DIAGNOSIS — N2581 Secondary hyperparathyroidism of renal origin: Secondary | ICD-10-CM | POA: Diagnosis not present

## 2020-04-03 DIAGNOSIS — N186 End stage renal disease: Secondary | ICD-10-CM | POA: Diagnosis not present

## 2020-04-03 DIAGNOSIS — E875 Hyperkalemia: Secondary | ICD-10-CM | POA: Diagnosis not present

## 2020-04-03 DIAGNOSIS — D631 Anemia in chronic kidney disease: Secondary | ICD-10-CM | POA: Diagnosis not present

## 2020-04-04 DIAGNOSIS — N186 End stage renal disease: Secondary | ICD-10-CM | POA: Diagnosis not present

## 2020-04-04 DIAGNOSIS — Z992 Dependence on renal dialysis: Secondary | ICD-10-CM | POA: Diagnosis not present

## 2020-04-04 DIAGNOSIS — I129 Hypertensive chronic kidney disease with stage 1 through stage 4 chronic kidney disease, or unspecified chronic kidney disease: Secondary | ICD-10-CM | POA: Diagnosis not present

## 2020-04-05 DIAGNOSIS — D631 Anemia in chronic kidney disease: Secondary | ICD-10-CM | POA: Diagnosis not present

## 2020-04-05 DIAGNOSIS — Z992 Dependence on renal dialysis: Secondary | ICD-10-CM | POA: Diagnosis not present

## 2020-04-05 DIAGNOSIS — E875 Hyperkalemia: Secondary | ICD-10-CM | POA: Diagnosis not present

## 2020-04-05 DIAGNOSIS — D689 Coagulation defect, unspecified: Secondary | ICD-10-CM | POA: Diagnosis not present

## 2020-04-05 DIAGNOSIS — N186 End stage renal disease: Secondary | ICD-10-CM | POA: Diagnosis not present

## 2020-04-05 DIAGNOSIS — N2581 Secondary hyperparathyroidism of renal origin: Secondary | ICD-10-CM | POA: Diagnosis not present

## 2020-04-14 ENCOUNTER — Ambulatory Visit (INDEPENDENT_AMBULATORY_CARE_PROVIDER_SITE_OTHER): Payer: Medicare Other

## 2020-04-14 ENCOUNTER — Other Ambulatory Visit: Payer: Self-pay | Admitting: Gastroenterology

## 2020-04-14 DIAGNOSIS — Z1159 Encounter for screening for other viral diseases: Secondary | ICD-10-CM

## 2020-04-17 ENCOUNTER — Telehealth: Payer: Self-pay | Admitting: Gastroenterology

## 2020-04-17 LAB — SARS CORONAVIRUS 2 (TAT 6-24 HRS): SARS Coronavirus 2: NEGATIVE

## 2020-04-17 NOTE — Telephone Encounter (Signed)
Hey Dr. Rush Landmark, patient cancelled hes endo/colon for tomorrow 6/15. He states that hes brother is in the hospital with cancer and not doing well. He has rescheduled for August 3rd.

## 2020-04-17 NOTE — Telephone Encounter (Signed)
I am sorry to hear this. This is completely understandable. I wish him and his family the best. I will see him later this year. GM

## 2020-04-18 ENCOUNTER — Encounter: Payer: Medicare Other | Admitting: Gastroenterology

## 2020-04-18 DIAGNOSIS — Z1159 Encounter for screening for other viral diseases: Secondary | ICD-10-CM | POA: Diagnosis not present

## 2020-04-24 DIAGNOSIS — N186 End stage renal disease: Secondary | ICD-10-CM | POA: Diagnosis not present

## 2020-04-24 DIAGNOSIS — N2581 Secondary hyperparathyroidism of renal origin: Secondary | ICD-10-CM | POA: Diagnosis not present

## 2020-04-24 DIAGNOSIS — Z992 Dependence on renal dialysis: Secondary | ICD-10-CM | POA: Diagnosis not present

## 2020-05-04 DIAGNOSIS — N186 End stage renal disease: Secondary | ICD-10-CM | POA: Diagnosis not present

## 2020-05-04 DIAGNOSIS — Z992 Dependence on renal dialysis: Secondary | ICD-10-CM | POA: Diagnosis not present

## 2020-05-04 DIAGNOSIS — I129 Hypertensive chronic kidney disease with stage 1 through stage 4 chronic kidney disease, or unspecified chronic kidney disease: Secondary | ICD-10-CM | POA: Diagnosis not present

## 2020-05-05 DIAGNOSIS — E875 Hyperkalemia: Secondary | ICD-10-CM | POA: Diagnosis not present

## 2020-05-05 DIAGNOSIS — N2581 Secondary hyperparathyroidism of renal origin: Secondary | ICD-10-CM | POA: Diagnosis not present

## 2020-05-05 DIAGNOSIS — Z992 Dependence on renal dialysis: Secondary | ICD-10-CM | POA: Diagnosis not present

## 2020-05-05 DIAGNOSIS — D631 Anemia in chronic kidney disease: Secondary | ICD-10-CM | POA: Diagnosis not present

## 2020-05-05 DIAGNOSIS — N186 End stage renal disease: Secondary | ICD-10-CM | POA: Diagnosis not present

## 2020-05-08 DIAGNOSIS — E875 Hyperkalemia: Secondary | ICD-10-CM | POA: Diagnosis not present

## 2020-05-08 DIAGNOSIS — N2581 Secondary hyperparathyroidism of renal origin: Secondary | ICD-10-CM | POA: Diagnosis not present

## 2020-05-08 DIAGNOSIS — Z992 Dependence on renal dialysis: Secondary | ICD-10-CM | POA: Diagnosis not present

## 2020-05-08 DIAGNOSIS — N186 End stage renal disease: Secondary | ICD-10-CM | POA: Diagnosis not present

## 2020-05-08 DIAGNOSIS — D631 Anemia in chronic kidney disease: Secondary | ICD-10-CM | POA: Diagnosis not present

## 2020-05-10 DIAGNOSIS — Z992 Dependence on renal dialysis: Secondary | ICD-10-CM | POA: Diagnosis not present

## 2020-05-10 DIAGNOSIS — D631 Anemia in chronic kidney disease: Secondary | ICD-10-CM | POA: Diagnosis not present

## 2020-05-10 DIAGNOSIS — E875 Hyperkalemia: Secondary | ICD-10-CM | POA: Diagnosis not present

## 2020-05-10 DIAGNOSIS — N186 End stage renal disease: Secondary | ICD-10-CM | POA: Diagnosis not present

## 2020-05-10 DIAGNOSIS — N2581 Secondary hyperparathyroidism of renal origin: Secondary | ICD-10-CM | POA: Diagnosis not present

## 2020-05-12 DIAGNOSIS — N186 End stage renal disease: Secondary | ICD-10-CM | POA: Diagnosis not present

## 2020-05-12 DIAGNOSIS — E875 Hyperkalemia: Secondary | ICD-10-CM | POA: Diagnosis not present

## 2020-05-12 DIAGNOSIS — N2581 Secondary hyperparathyroidism of renal origin: Secondary | ICD-10-CM | POA: Diagnosis not present

## 2020-05-12 DIAGNOSIS — D631 Anemia in chronic kidney disease: Secondary | ICD-10-CM | POA: Diagnosis not present

## 2020-05-12 DIAGNOSIS — Z992 Dependence on renal dialysis: Secondary | ICD-10-CM | POA: Diagnosis not present

## 2020-05-15 DIAGNOSIS — E875 Hyperkalemia: Secondary | ICD-10-CM | POA: Diagnosis not present

## 2020-05-15 DIAGNOSIS — D631 Anemia in chronic kidney disease: Secondary | ICD-10-CM | POA: Diagnosis not present

## 2020-05-15 DIAGNOSIS — N2581 Secondary hyperparathyroidism of renal origin: Secondary | ICD-10-CM | POA: Diagnosis not present

## 2020-05-15 DIAGNOSIS — Z992 Dependence on renal dialysis: Secondary | ICD-10-CM | POA: Diagnosis not present

## 2020-05-15 DIAGNOSIS — N186 End stage renal disease: Secondary | ICD-10-CM | POA: Diagnosis not present

## 2020-05-19 DIAGNOSIS — Z992 Dependence on renal dialysis: Secondary | ICD-10-CM | POA: Diagnosis not present

## 2020-05-19 DIAGNOSIS — N186 End stage renal disease: Secondary | ICD-10-CM | POA: Diagnosis not present

## 2020-05-19 DIAGNOSIS — E875 Hyperkalemia: Secondary | ICD-10-CM | POA: Diagnosis not present

## 2020-05-19 DIAGNOSIS — D631 Anemia in chronic kidney disease: Secondary | ICD-10-CM | POA: Diagnosis not present

## 2020-05-19 DIAGNOSIS — N2581 Secondary hyperparathyroidism of renal origin: Secondary | ICD-10-CM | POA: Diagnosis not present

## 2020-05-22 DIAGNOSIS — E875 Hyperkalemia: Secondary | ICD-10-CM | POA: Diagnosis not present

## 2020-05-22 DIAGNOSIS — Z992 Dependence on renal dialysis: Secondary | ICD-10-CM | POA: Diagnosis not present

## 2020-05-22 DIAGNOSIS — N2581 Secondary hyperparathyroidism of renal origin: Secondary | ICD-10-CM | POA: Diagnosis not present

## 2020-05-22 DIAGNOSIS — N186 End stage renal disease: Secondary | ICD-10-CM | POA: Diagnosis not present

## 2020-05-22 DIAGNOSIS — D631 Anemia in chronic kidney disease: Secondary | ICD-10-CM | POA: Diagnosis not present

## 2020-05-23 ENCOUNTER — Telehealth: Payer: Self-pay

## 2020-05-23 DIAGNOSIS — S62242A Displaced fracture of shaft of first metacarpal bone, left hand, initial encounter for closed fracture: Secondary | ICD-10-CM | POA: Diagnosis not present

## 2020-05-23 NOTE — Telephone Encounter (Signed)
Pt no show for pv call to pt and resched pv and let pt know his colonoscopy will need to be canceled if he misses the next previsit.  Pt verb understanding.

## 2020-05-24 DIAGNOSIS — D631 Anemia in chronic kidney disease: Secondary | ICD-10-CM | POA: Diagnosis not present

## 2020-05-24 DIAGNOSIS — E875 Hyperkalemia: Secondary | ICD-10-CM | POA: Diagnosis not present

## 2020-05-24 DIAGNOSIS — N186 End stage renal disease: Secondary | ICD-10-CM | POA: Diagnosis not present

## 2020-05-24 DIAGNOSIS — Z992 Dependence on renal dialysis: Secondary | ICD-10-CM | POA: Diagnosis not present

## 2020-05-24 DIAGNOSIS — N2581 Secondary hyperparathyroidism of renal origin: Secondary | ICD-10-CM | POA: Diagnosis not present

## 2020-05-26 DIAGNOSIS — Z992 Dependence on renal dialysis: Secondary | ICD-10-CM | POA: Diagnosis not present

## 2020-05-26 DIAGNOSIS — D631 Anemia in chronic kidney disease: Secondary | ICD-10-CM | POA: Diagnosis not present

## 2020-05-26 DIAGNOSIS — N2581 Secondary hyperparathyroidism of renal origin: Secondary | ICD-10-CM | POA: Diagnosis not present

## 2020-05-26 DIAGNOSIS — E875 Hyperkalemia: Secondary | ICD-10-CM | POA: Diagnosis not present

## 2020-05-26 DIAGNOSIS — N186 End stage renal disease: Secondary | ICD-10-CM | POA: Diagnosis not present

## 2020-05-29 DIAGNOSIS — D631 Anemia in chronic kidney disease: Secondary | ICD-10-CM | POA: Diagnosis not present

## 2020-05-29 DIAGNOSIS — N186 End stage renal disease: Secondary | ICD-10-CM | POA: Diagnosis not present

## 2020-05-29 DIAGNOSIS — Z992 Dependence on renal dialysis: Secondary | ICD-10-CM | POA: Diagnosis not present

## 2020-05-29 DIAGNOSIS — E875 Hyperkalemia: Secondary | ICD-10-CM | POA: Diagnosis not present

## 2020-05-29 DIAGNOSIS — N2581 Secondary hyperparathyroidism of renal origin: Secondary | ICD-10-CM | POA: Diagnosis not present

## 2020-05-30 DIAGNOSIS — Z23 Encounter for immunization: Secondary | ICD-10-CM | POA: Diagnosis not present

## 2020-05-31 DIAGNOSIS — Z992 Dependence on renal dialysis: Secondary | ICD-10-CM | POA: Diagnosis not present

## 2020-05-31 DIAGNOSIS — N186 End stage renal disease: Secondary | ICD-10-CM | POA: Diagnosis not present

## 2020-05-31 DIAGNOSIS — E875 Hyperkalemia: Secondary | ICD-10-CM | POA: Diagnosis not present

## 2020-05-31 DIAGNOSIS — N2581 Secondary hyperparathyroidism of renal origin: Secondary | ICD-10-CM | POA: Diagnosis not present

## 2020-05-31 DIAGNOSIS — D631 Anemia in chronic kidney disease: Secondary | ICD-10-CM | POA: Diagnosis not present

## 2020-06-01 ENCOUNTER — Other Ambulatory Visit: Payer: Self-pay

## 2020-06-01 ENCOUNTER — Ambulatory Visit (INDEPENDENT_AMBULATORY_CARE_PROVIDER_SITE_OTHER): Payer: Medicare Other

## 2020-06-01 ENCOUNTER — Ambulatory Visit: Payer: Medicare Other | Admitting: *Deleted

## 2020-06-01 ENCOUNTER — Other Ambulatory Visit: Payer: Self-pay | Admitting: Gastroenterology

## 2020-06-01 VITALS — Ht 68.0 in | Wt 130.8 lb

## 2020-06-01 DIAGNOSIS — Z1159 Encounter for screening for other viral diseases: Secondary | ICD-10-CM | POA: Diagnosis not present

## 2020-06-01 DIAGNOSIS — K7460 Unspecified cirrhosis of liver: Secondary | ICD-10-CM

## 2020-06-01 LAB — SARS CORONAVIRUS 2 (TAT 6-24 HRS): SARS Coronavirus 2: NEGATIVE

## 2020-06-01 MED ORDER — SUTAB 1479-225-188 MG PO TABS
1.0000 | ORAL_TABLET | ORAL | 0 refills | Status: DC
Start: 2020-06-01 — End: 2020-06-16

## 2020-06-01 NOTE — Progress Notes (Signed)
Patient denies any allergies to egg or soy products. Patient denies complications with anesthesia/sedation.  Patient denies oxygen use at home and denies diet medications. Emmi instructions for colonoscopy explained and given to patient. Covid test scheduled for 05/22/20. Patient has Sutab prep at home.

## 2020-06-02 DIAGNOSIS — E875 Hyperkalemia: Secondary | ICD-10-CM | POA: Diagnosis not present

## 2020-06-02 DIAGNOSIS — Z992 Dependence on renal dialysis: Secondary | ICD-10-CM | POA: Diagnosis not present

## 2020-06-02 DIAGNOSIS — N2581 Secondary hyperparathyroidism of renal origin: Secondary | ICD-10-CM | POA: Diagnosis not present

## 2020-06-02 DIAGNOSIS — D631 Anemia in chronic kidney disease: Secondary | ICD-10-CM | POA: Diagnosis not present

## 2020-06-02 DIAGNOSIS — N186 End stage renal disease: Secondary | ICD-10-CM | POA: Diagnosis not present

## 2020-06-04 DIAGNOSIS — Z992 Dependence on renal dialysis: Secondary | ICD-10-CM | POA: Diagnosis not present

## 2020-06-04 DIAGNOSIS — I129 Hypertensive chronic kidney disease with stage 1 through stage 4 chronic kidney disease, or unspecified chronic kidney disease: Secondary | ICD-10-CM | POA: Diagnosis not present

## 2020-06-04 DIAGNOSIS — N186 End stage renal disease: Secondary | ICD-10-CM | POA: Diagnosis not present

## 2020-06-05 DIAGNOSIS — N2581 Secondary hyperparathyroidism of renal origin: Secondary | ICD-10-CM | POA: Diagnosis not present

## 2020-06-05 DIAGNOSIS — E875 Hyperkalemia: Secondary | ICD-10-CM | POA: Diagnosis not present

## 2020-06-05 DIAGNOSIS — D631 Anemia in chronic kidney disease: Secondary | ICD-10-CM | POA: Diagnosis not present

## 2020-06-05 DIAGNOSIS — Z992 Dependence on renal dialysis: Secondary | ICD-10-CM | POA: Diagnosis not present

## 2020-06-05 DIAGNOSIS — N186 End stage renal disease: Secondary | ICD-10-CM | POA: Diagnosis not present

## 2020-06-06 ENCOUNTER — Encounter: Payer: Medicare Other | Admitting: Gastroenterology

## 2020-06-09 DIAGNOSIS — N186 End stage renal disease: Secondary | ICD-10-CM | POA: Diagnosis not present

## 2020-06-09 DIAGNOSIS — E875 Hyperkalemia: Secondary | ICD-10-CM | POA: Diagnosis not present

## 2020-06-09 DIAGNOSIS — N2581 Secondary hyperparathyroidism of renal origin: Secondary | ICD-10-CM | POA: Diagnosis not present

## 2020-06-09 DIAGNOSIS — D631 Anemia in chronic kidney disease: Secondary | ICD-10-CM | POA: Diagnosis not present

## 2020-06-09 DIAGNOSIS — Z992 Dependence on renal dialysis: Secondary | ICD-10-CM | POA: Diagnosis not present

## 2020-06-12 ENCOUNTER — Emergency Department (HOSPITAL_COMMUNITY)
Admission: EM | Admit: 2020-06-12 | Discharge: 2020-06-12 | Disposition: A | Discharge status: Home / Self Care | Payer: Medicare Other | Source: Home / Self Care

## 2020-06-12 ENCOUNTER — Inpatient Hospital Stay (HOSPITAL_COMMUNITY)
Admission: EM | Admit: 2020-06-12 | Discharge: 2020-06-16 | DRG: 177 | Disposition: A | Discharge status: Home / Self Care | Payer: Medicare Other | Attending: Student in an Organized Health Care Education/Training Program | Admitting: Student in an Organized Health Care Education/Training Program

## 2020-06-12 ENCOUNTER — Emergency Department (HOSPITAL_COMMUNITY): Payer: Medicare Other

## 2020-06-12 DIAGNOSIS — R0902 Hypoxemia: Secondary | ICD-10-CM

## 2020-06-12 DIAGNOSIS — F17210 Nicotine dependence, cigarettes, uncomplicated: Secondary | ICD-10-CM | POA: Diagnosis present

## 2020-06-12 DIAGNOSIS — I43 Cardiomyopathy in diseases classified elsewhere: Secondary | ICD-10-CM | POA: Diagnosis present

## 2020-06-12 DIAGNOSIS — R05 Cough: Secondary | ICD-10-CM | POA: Insufficient documentation

## 2020-06-12 DIAGNOSIS — Z881 Allergy status to other antibiotic agents status: Secondary | ICD-10-CM

## 2020-06-12 DIAGNOSIS — Z888 Allergy status to other drugs, medicaments and biological substances status: Secondary | ICD-10-CM

## 2020-06-12 DIAGNOSIS — Z86711 Personal history of pulmonary embolism: Secondary | ICD-10-CM

## 2020-06-12 DIAGNOSIS — D472 Monoclonal gammopathy: Secondary | ICD-10-CM | POA: Diagnosis present

## 2020-06-12 DIAGNOSIS — Z809 Family history of malignant neoplasm, unspecified: Secondary | ICD-10-CM

## 2020-06-12 DIAGNOSIS — N186 End stage renal disease: Secondary | ICD-10-CM | POA: Diagnosis present

## 2020-06-12 DIAGNOSIS — E7800 Pure hypercholesterolemia, unspecified: Secondary | ICD-10-CM | POA: Diagnosis present

## 2020-06-12 DIAGNOSIS — I69398 Other sequelae of cerebral infarction: Secondary | ICD-10-CM

## 2020-06-12 DIAGNOSIS — U071 COVID-19: Secondary | ICD-10-CM | POA: Diagnosis present

## 2020-06-12 DIAGNOSIS — Z209 Contact with and (suspected) exposure to unspecified communicable disease: Secondary | ICD-10-CM | POA: Diagnosis not present

## 2020-06-12 DIAGNOSIS — R918 Other nonspecific abnormal finding of lung field: Secondary | ICD-10-CM

## 2020-06-12 DIAGNOSIS — E785 Hyperlipidemia, unspecified: Secondary | ICD-10-CM | POA: Diagnosis present

## 2020-06-12 DIAGNOSIS — I120 Hypertensive chronic kidney disease with stage 5 chronic kidney disease or end stage renal disease: Secondary | ICD-10-CM | POA: Diagnosis present

## 2020-06-12 DIAGNOSIS — E875 Hyperkalemia: Secondary | ICD-10-CM | POA: Diagnosis present

## 2020-06-12 DIAGNOSIS — H538 Other visual disturbances: Secondary | ICD-10-CM | POA: Diagnosis present

## 2020-06-12 DIAGNOSIS — J811 Chronic pulmonary edema: Secondary | ICD-10-CM | POA: Diagnosis not present

## 2020-06-12 DIAGNOSIS — J9601 Acute respiratory failure with hypoxia: Secondary | ICD-10-CM | POA: Diagnosis present

## 2020-06-12 DIAGNOSIS — K219 Gastro-esophageal reflux disease without esophagitis: Secondary | ICD-10-CM | POA: Diagnosis present

## 2020-06-12 DIAGNOSIS — J810 Acute pulmonary edema: Secondary | ICD-10-CM | POA: Diagnosis not present

## 2020-06-12 DIAGNOSIS — R0602 Shortness of breath: Secondary | ICD-10-CM | POA: Diagnosis not present

## 2020-06-12 DIAGNOSIS — D696 Thrombocytopenia, unspecified: Secondary | ICD-10-CM | POA: Diagnosis present

## 2020-06-12 DIAGNOSIS — J1282 Pneumonia due to coronavirus disease 2019: Secondary | ICD-10-CM | POA: Diagnosis present

## 2020-06-12 DIAGNOSIS — Z992 Dependence on renal dialysis: Secondary | ICD-10-CM

## 2020-06-12 DIAGNOSIS — R531 Weakness: Secondary | ICD-10-CM | POA: Diagnosis not present

## 2020-06-12 DIAGNOSIS — Z09 Encounter for follow-up examination after completed treatment for conditions other than malignant neoplasm: Secondary | ICD-10-CM

## 2020-06-12 DIAGNOSIS — K7460 Unspecified cirrhosis of liver: Secondary | ICD-10-CM | POA: Diagnosis present

## 2020-06-12 DIAGNOSIS — E2740 Unspecified adrenocortical insufficiency: Secondary | ICD-10-CM | POA: Diagnosis present

## 2020-06-12 DIAGNOSIS — D631 Anemia in chronic kidney disease: Secondary | ICD-10-CM | POA: Diagnosis present

## 2020-06-12 DIAGNOSIS — R6883 Chills (without fever): Secondary | ICD-10-CM | POA: Insufficient documentation

## 2020-06-12 DIAGNOSIS — Z5321 Procedure and treatment not carried out due to patient leaving prior to being seen by health care provider: Secondary | ICD-10-CM | POA: Insufficient documentation

## 2020-06-12 DIAGNOSIS — I10 Essential (primary) hypertension: Secondary | ICD-10-CM | POA: Diagnosis not present

## 2020-06-12 LAB — COMPREHENSIVE METABOLIC PANEL
ALT: 10 U/L (ref 0–44)
AST: 26 U/L (ref 15–41)
Albumin: 2.9 g/dL — ABNORMAL LOW (ref 3.5–5.0)
Alkaline Phosphatase: 130 U/L — ABNORMAL HIGH (ref 38–126)
Anion gap: 14 (ref 5–15)
BUN: 54 mg/dL — ABNORMAL HIGH (ref 6–20)
CO2: 22 mmol/L (ref 22–32)
Calcium: 8 mg/dL — ABNORMAL LOW (ref 8.9–10.3)
Chloride: 101 mmol/L (ref 98–111)
Creatinine, Ser: 12.78 mg/dL — ABNORMAL HIGH (ref 0.61–1.24)
GFR calc Af Amer: 5 mL/min — ABNORMAL LOW (ref >60)
GFR calc non Af Amer: 4 mL/min — ABNORMAL LOW (ref >60)
Glucose, Bld: 69 mg/dL — ABNORMAL LOW (ref 70–99)
Potassium: 5.4 mmol/L — ABNORMAL HIGH (ref 3.5–5.1)
Sodium: 137 mmol/L (ref 135–145)
Total Bilirubin: 0.7 mg/dL (ref 0.3–1.2)
Total Protein: 8.2 g/dL — ABNORMAL HIGH (ref 6.5–8.1)

## 2020-06-12 LAB — CBC WITH DIFFERENTIAL/PLATELET
Abs Immature Granulocytes: 0.02 10*3/uL (ref 0.00–0.07)
Basophils Absolute: 0 10*3/uL (ref 0.0–0.1)
Basophils Relative: 1 %
Eosinophils Absolute: 0.5 10*3/uL (ref 0.0–0.5)
Eosinophils Relative: 9 %
HCT: 30.3 % — ABNORMAL LOW (ref 39.0–52.0)
Hemoglobin: 9.3 g/dL — ABNORMAL LOW (ref 13.0–17.0)
Immature Granulocytes: 0 %
Lymphocytes Relative: 9 %
Lymphs Abs: 0.5 10*3/uL — ABNORMAL LOW (ref 0.7–4.0)
MCH: 27.2 pg (ref 26.0–34.0)
MCHC: 30.7 g/dL (ref 30.0–36.0)
MCV: 88.6 fL (ref 80.0–100.0)
Monocytes Absolute: 0.5 10*3/uL (ref 0.1–1.0)
Monocytes Relative: 9 %
Neutro Abs: 4.2 10*3/uL (ref 1.7–7.7)
Neutrophils Relative %: 72 %
Platelets: 127 10*3/uL — ABNORMAL LOW (ref 150–400)
RBC: 3.42 MIL/uL — ABNORMAL LOW (ref 4.22–5.81)
RDW: 21.2 % — ABNORMAL HIGH (ref 11.5–15.5)
WBC: 5.8 10*3/uL (ref 4.0–10.5)
nRBC: 0 % (ref 0.0–0.2)

## 2020-06-12 LAB — LACTIC ACID, PLASMA: Lactic Acid, Venous: 0.9 mmol/L (ref 0.5–1.9)

## 2020-06-12 MED ORDER — SODIUM CHLORIDE 0.9% FLUSH: 3.0000 mL | Freq: Once | INTRAVENOUS | Status: DC

## 2020-06-12 NOTE — ED Notes (Signed)
Pt handed me his stickers and walked out

## 2020-06-12 NOTE — ED Triage Notes (Signed)
Pt reports he continues to feel poorly.

## 2020-06-12 NOTE — ED Triage Notes (Signed)
Patient arrived with EMS from home , missed hemodialysis today reports SOB with productive cough and low grade fever this evening , patient left the ER today while at the waiting area.

## 2020-06-12 NOTE — ED Triage Notes (Signed)
Pt. Stated, I started with a cough on Sat. And now I have a chill.  Due for dialysis today.

## 2020-06-13 ENCOUNTER — Other Ambulatory Visit: Payer: Medicare Other

## 2020-06-13 ENCOUNTER — Other Ambulatory Visit: Payer: Self-pay

## 2020-06-13 ENCOUNTER — Encounter (HOSPITAL_COMMUNITY): Payer: Self-pay | Admitting: Student in an Organized Health Care Education/Training Program

## 2020-06-13 ENCOUNTER — Emergency Department (HOSPITAL_COMMUNITY): Payer: Medicare Other

## 2020-06-13 DIAGNOSIS — D472 Monoclonal gammopathy: Secondary | ICD-10-CM | POA: Diagnosis present

## 2020-06-13 DIAGNOSIS — D631 Anemia in chronic kidney disease: Secondary | ICD-10-CM | POA: Diagnosis not present

## 2020-06-13 DIAGNOSIS — E785 Hyperlipidemia, unspecified: Secondary | ICD-10-CM | POA: Diagnosis not present

## 2020-06-13 DIAGNOSIS — R0602 Shortness of breath: Secondary | ICD-10-CM | POA: Diagnosis not present

## 2020-06-13 DIAGNOSIS — Z809 Family history of malignant neoplasm, unspecified: Secondary | ICD-10-CM | POA: Diagnosis not present

## 2020-06-13 DIAGNOSIS — J1282 Pneumonia due to coronavirus disease 2019: Secondary | ICD-10-CM | POA: Diagnosis present

## 2020-06-13 DIAGNOSIS — E8770 Fluid overload, unspecified: Secondary | ICD-10-CM | POA: Diagnosis not present

## 2020-06-13 DIAGNOSIS — I43 Cardiomyopathy in diseases classified elsewhere: Secondary | ICD-10-CM | POA: Diagnosis not present

## 2020-06-13 DIAGNOSIS — U071 COVID-19: Secondary | ICD-10-CM | POA: Diagnosis present

## 2020-06-13 DIAGNOSIS — J811 Chronic pulmonary edema: Secondary | ICD-10-CM | POA: Diagnosis not present

## 2020-06-13 DIAGNOSIS — I120 Hypertensive chronic kidney disease with stage 5 chronic kidney disease or end stage renal disease: Secondary | ICD-10-CM | POA: Diagnosis not present

## 2020-06-13 DIAGNOSIS — N250 Renal osteodystrophy: Secondary | ICD-10-CM | POA: Diagnosis not present

## 2020-06-13 DIAGNOSIS — I517 Cardiomegaly: Secondary | ICD-10-CM | POA: Diagnosis not present

## 2020-06-13 DIAGNOSIS — J810 Acute pulmonary edema: Secondary | ICD-10-CM | POA: Diagnosis present

## 2020-06-13 DIAGNOSIS — E7800 Pure hypercholesterolemia, unspecified: Secondary | ICD-10-CM | POA: Diagnosis present

## 2020-06-13 DIAGNOSIS — R531 Weakness: Secondary | ICD-10-CM | POA: Diagnosis not present

## 2020-06-13 DIAGNOSIS — H538 Other visual disturbances: Secondary | ICD-10-CM | POA: Diagnosis present

## 2020-06-13 DIAGNOSIS — J90 Pleural effusion, not elsewhere classified: Secondary | ICD-10-CM | POA: Diagnosis not present

## 2020-06-13 DIAGNOSIS — K219 Gastro-esophageal reflux disease without esophagitis: Secondary | ICD-10-CM | POA: Diagnosis not present

## 2020-06-13 DIAGNOSIS — J9601 Acute respiratory failure with hypoxia: Secondary | ICD-10-CM | POA: Diagnosis present

## 2020-06-13 DIAGNOSIS — Z992 Dependence on renal dialysis: Secondary | ICD-10-CM | POA: Diagnosis not present

## 2020-06-13 DIAGNOSIS — Z881 Allergy status to other antibiotic agents status: Secondary | ICD-10-CM | POA: Diagnosis not present

## 2020-06-13 DIAGNOSIS — N186 End stage renal disease: Secondary | ICD-10-CM | POA: Diagnosis present

## 2020-06-13 DIAGNOSIS — I69398 Other sequelae of cerebral infarction: Secondary | ICD-10-CM | POA: Diagnosis not present

## 2020-06-13 DIAGNOSIS — K7460 Unspecified cirrhosis of liver: Secondary | ICD-10-CM | POA: Diagnosis not present

## 2020-06-13 DIAGNOSIS — Z888 Allergy status to other drugs, medicaments and biological substances status: Secondary | ICD-10-CM | POA: Diagnosis not present

## 2020-06-13 DIAGNOSIS — E2740 Unspecified adrenocortical insufficiency: Secondary | ICD-10-CM | POA: Diagnosis present

## 2020-06-13 DIAGNOSIS — D696 Thrombocytopenia, unspecified: Secondary | ICD-10-CM | POA: Diagnosis present

## 2020-06-13 DIAGNOSIS — Z86711 Personal history of pulmonary embolism: Secondary | ICD-10-CM | POA: Diagnosis not present

## 2020-06-13 DIAGNOSIS — E875 Hyperkalemia: Secondary | ICD-10-CM | POA: Diagnosis present

## 2020-06-13 DIAGNOSIS — F17210 Nicotine dependence, cigarettes, uncomplicated: Secondary | ICD-10-CM | POA: Diagnosis present

## 2020-06-13 LAB — CBC WITH DIFFERENTIAL/PLATELET
Abs Immature Granulocytes: 0 10*3/uL (ref 0.00–0.07)
Basophils Absolute: 0.2 10*3/uL — ABNORMAL HIGH (ref 0.0–0.1)
Basophils Relative: 4 %
Eosinophils Absolute: 0.1 10*3/uL (ref 0.0–0.5)
Eosinophils Relative: 2 %
HCT: 32.3 % — ABNORMAL LOW (ref 39.0–52.0)
Hemoglobin: 9.8 g/dL — ABNORMAL LOW (ref 13.0–17.0)
Lymphocytes Relative: 10 %
Lymphs Abs: 0.6 10*3/uL — ABNORMAL LOW (ref 0.7–4.0)
MCH: 26.8 pg (ref 26.0–34.0)
MCHC: 30.3 g/dL (ref 30.0–36.0)
MCV: 88.3 fL (ref 80.0–100.0)
Monocytes Absolute: 0.1 10*3/uL (ref 0.1–1.0)
Monocytes Relative: 1 %
Neutro Abs: 4.9 10*3/uL (ref 1.7–7.7)
Neutrophils Relative %: 83 %
Platelets: 124 10*3/uL — ABNORMAL LOW (ref 150–400)
RBC: 3.66 MIL/uL — ABNORMAL LOW (ref 4.22–5.81)
RDW: 21.1 % — ABNORMAL HIGH (ref 11.5–15.5)
WBC: 5.9 10*3/uL (ref 4.0–10.5)
nRBC: 0 % (ref 0.0–0.2)
nRBC: 0 /100 WBC

## 2020-06-13 LAB — COMPREHENSIVE METABOLIC PANEL
ALT: 13 U/L (ref 0–44)
AST: 33 U/L (ref 15–41)
Albumin: 3.1 g/dL — ABNORMAL LOW (ref 3.5–5.0)
Alkaline Phosphatase: 186 U/L — ABNORMAL HIGH (ref 38–126)
Anion gap: 18 — ABNORMAL HIGH (ref 5–15)
BUN: 70 mg/dL — ABNORMAL HIGH (ref 6–20)
CO2: 19 mmol/L — ABNORMAL LOW (ref 22–32)
Calcium: 8.2 mg/dL — ABNORMAL LOW (ref 8.9–10.3)
Chloride: 98 mmol/L (ref 98–111)
Creatinine, Ser: 13.69 mg/dL — ABNORMAL HIGH (ref 0.61–1.24)
GFR calc Af Amer: 4 mL/min — ABNORMAL LOW (ref >60)
GFR calc non Af Amer: 4 mL/min — ABNORMAL LOW (ref >60)
Glucose, Bld: 65 mg/dL — ABNORMAL LOW (ref 70–99)
Potassium: 6.2 mmol/L — ABNORMAL HIGH (ref 3.5–5.1)
Sodium: 135 mmol/L (ref 135–145)
Total Bilirubin: 0.9 mg/dL (ref 0.3–1.2)
Total Protein: 8.8 g/dL — ABNORMAL HIGH (ref 6.5–8.1)

## 2020-06-13 LAB — RENAL FUNCTION PANEL
Albumin: 2.8 g/dL — ABNORMAL LOW (ref 3.5–5.0)
Anion gap: 16 — ABNORMAL HIGH (ref 5–15)
BUN: 74 mg/dL — ABNORMAL HIGH (ref 6–20)
CO2: 19 mmol/L — ABNORMAL LOW (ref 22–32)
Calcium: 7.7 mg/dL — ABNORMAL LOW (ref 8.9–10.3)
Chloride: 100 mmol/L (ref 98–111)
Creatinine, Ser: 14.23 mg/dL — ABNORMAL HIGH (ref 0.61–1.24)
GFR calc Af Amer: 4 mL/min — ABNORMAL LOW (ref >60)
GFR calc non Af Amer: 4 mL/min — ABNORMAL LOW (ref >60)
Glucose, Bld: 39 mg/dL — CL (ref 70–99)
Phosphorus: 5.6 mg/dL — ABNORMAL HIGH (ref 2.5–4.6)
Potassium: 6.1 mmol/L — ABNORMAL HIGH (ref 3.5–5.1)
Sodium: 135 mmol/L (ref 135–145)

## 2020-06-13 LAB — LACTIC ACID, PLASMA
Lactic Acid, Venous: 0.6 mmol/L (ref 0.5–1.9)
Lactic Acid, Venous: 0.7 mmol/L (ref 0.5–1.9)

## 2020-06-13 LAB — HEPATITIS C ANTIBODY: HCV Ab: NONREACTIVE

## 2020-06-13 LAB — HIV ANTIBODY (ROUTINE TESTING W REFLEX): HIV Screen 4th Generation wRfx: NONREACTIVE

## 2020-06-13 LAB — CBG MONITORING, ED
Glucose-Capillary: 31 mg/dL — CL (ref 70–99)
Glucose-Capillary: 157 mg/dL — ABNORMAL HIGH (ref 70–99)

## 2020-06-13 LAB — SARS CORONAVIRUS 2 BY RT PCR (HOSPITAL ORDER, PERFORMED IN ~~LOC~~ HOSPITAL LAB): SARS Coronavirus 2: POSITIVE — AB

## 2020-06-13 MED ORDER — MELATONIN 3 MG PO TABS
3.0000 mg | ORAL_TABLET | Freq: Every evening | ORAL | Status: DC | PRN
  Administered 2020-06-14 – 2020-06-15 (×2): 3 mg via ORAL
  Filled 2020-06-13 (×2): qty 1

## 2020-06-13 MED ORDER — DEXTROSE 50 % IV SOLN
50.0000 mL | Freq: Once | INTRAVENOUS | Status: AC
  Administered 2020-06-13: 50 mL via INTRAVENOUS
  Filled 2020-06-13: qty 50

## 2020-06-13 MED ORDER — INSULIN ASPART 100 UNIT/ML IV SOLN: 5.0000 [IU] | Freq: Once | INTRAVENOUS | Status: DC

## 2020-06-13 MED ORDER — SODIUM ZIRCONIUM CYCLOSILICATE 10 G PO PACK
10.0000 g | PACK | Freq: Once | ORAL | Status: AC
  Administered 2020-06-13: 10 g via ORAL
  Filled 2020-06-13: qty 1

## 2020-06-13 MED ORDER — HYDROCORTISONE 5 MG PO TABS
5.0000 mg | ORAL_TABLET | Freq: Every day | ORAL | Status: DC
  Administered 2020-06-14 – 2020-06-15 (×2): 5 mg via ORAL
  Filled 2020-06-13 (×3): qty 1

## 2020-06-13 MED ORDER — HYDRALAZINE HCL 50 MG PO TABS
50.0000 mg | ORAL_TABLET | Freq: Three times a day (TID) | ORAL | Status: DC
  Administered 2020-06-13 – 2020-06-16 (×6): 50 mg via ORAL
  Filled 2020-06-13 (×7): qty 1

## 2020-06-13 MED ORDER — ACETAMINOPHEN 325 MG PO TABS: 650.0000 mg | ORAL_TABLET | Freq: Once | ORAL | Status: AC

## 2020-06-13 MED ORDER — ATORVASTATIN CALCIUM 40 MG PO TABS
40.0000 mg | ORAL_TABLET | Freq: Every day | ORAL | Status: DC
  Administered 2020-06-14 – 2020-06-15 (×2): 40 mg via ORAL
  Filled 2020-06-13 (×2): qty 1

## 2020-06-13 MED ORDER — CALCIUM GLUCONATE-NACL 1-0.675 GM/50ML-% IV SOLN
1.0000 g | Freq: Once | INTRAVENOUS | Status: AC
  Administered 2020-06-13: 1000 mg via INTRAVENOUS
  Filled 2020-06-13: qty 50

## 2020-06-13 MED ORDER — CHLORHEXIDINE GLUCONATE CLOTH 2 % EX PADS
6.0000 | MEDICATED_PAD | Freq: Every day | CUTANEOUS | Status: DC
  Administered 2020-06-15 – 2020-06-16 (×2): 6 via TOPICAL

## 2020-06-13 MED ORDER — METHYLPREDNISOLONE SODIUM SUCC 40 MG IJ SOLR
0.5000 mg/kg | Freq: Two times a day (BID) | INTRAMUSCULAR | Status: AC
  Administered 2020-06-13 – 2020-06-14 (×4): 30.8 mg via INTRAVENOUS
  Filled 2020-06-13 (×4): qty 1

## 2020-06-13 MED ORDER — SODIUM CHLORIDE 0.9 % IV SOLN
100.0000 mg | Freq: Every day | INTRAVENOUS | Status: DC
  Administered 2020-06-14 – 2020-06-16 (×3): 100 mg via INTRAVENOUS
  Filled 2020-06-13 (×3): qty 20

## 2020-06-13 MED ORDER — ALBUTEROL SULFATE HFA 108 (90 BASE) MCG/ACT IN AERS
2.0000 | INHALATION_SPRAY | Freq: Four times a day (QID) | RESPIRATORY_TRACT | Status: DC
  Administered 2020-06-13 – 2020-06-16 (×8): 2 via RESPIRATORY_TRACT
  Filled 2020-06-13 (×2): qty 6.7

## 2020-06-13 MED ORDER — ASCORBIC ACID 500 MG PO TABS
500.0000 mg | ORAL_TABLET | Freq: Every day | ORAL | Status: DC
  Administered 2020-06-14 – 2020-06-15 (×2): 500 mg via ORAL
  Filled 2020-06-13 (×2): qty 1

## 2020-06-13 MED ORDER — SODIUM CHLORIDE 0.9 % IV SOLN
200.0000 mg | Freq: Once | INTRAVENOUS | Status: AC
  Administered 2020-06-13: 200 mg via INTRAVENOUS
  Filled 2020-06-13: qty 40

## 2020-06-13 MED ORDER — HEPARIN SODIUM (PORCINE) 1000 UNIT/ML DIALYSIS
2000.0000 [IU] | INTRAMUSCULAR | Status: DC | PRN
  Filled 2020-06-13: qty 2

## 2020-06-13 MED ORDER — ACETAMINOPHEN 325 MG PO TABS
ORAL_TABLET | ORAL | Status: AC
  Administered 2020-06-13: 650 mg via ORAL
  Filled 2020-06-13: qty 2

## 2020-06-13 MED ORDER — SODIUM BICARBONATE 8.4 % IV SOLN
50.0000 meq | Freq: Once | INTRAVENOUS | Status: AC
  Administered 2020-06-13: 50 meq via INTRAVENOUS
  Filled 2020-06-13: qty 50

## 2020-06-13 MED ORDER — GUAIFENESIN-DM 100-10 MG/5ML PO SYRP
10.0000 mL | ORAL_SOLUTION | ORAL | Status: DC | PRN
  Administered 2020-06-15: 10 mL via ORAL
  Filled 2020-06-13: qty 10

## 2020-06-13 MED ORDER — ZINC SULFATE 220 (50 ZN) MG PO CAPS
220.0000 mg | ORAL_CAPSULE | Freq: Every day | ORAL | Status: DC
  Administered 2020-06-14 – 2020-06-15 (×2): 220 mg via ORAL
  Filled 2020-06-13 (×2): qty 1

## 2020-06-13 MED ORDER — HEPARIN SODIUM (PORCINE) 5000 UNIT/ML IJ SOLN
5000.0000 [IU] | Freq: Three times a day (TID) | INTRAMUSCULAR | Status: DC
  Filled 2020-06-13 (×3): qty 1

## 2020-06-13 MED ORDER — POLYETHYLENE GLYCOL 3350 17 G PO PACK: 17.0000 g | PACK | Freq: Every day | ORAL | Status: DC | PRN

## 2020-06-13 MED ORDER — AMLODIPINE BESYLATE 10 MG PO TABS
10.0000 mg | ORAL_TABLET | Freq: Every day | ORAL | Status: DC
  Administered 2020-06-14 – 2020-06-15 (×2): 10 mg via ORAL
  Filled 2020-06-13 (×2): qty 1

## 2020-06-13 MED ORDER — PANTOPRAZOLE SODIUM 40 MG PO TBEC
40.0000 mg | DELAYED_RELEASE_TABLET | Freq: Every day | ORAL | Status: DC
  Administered 2020-06-14 – 2020-06-15 (×2): 40 mg via ORAL
  Filled 2020-06-13 (×2): qty 1

## 2020-06-13 MED ORDER — AEROCHAMBER PLUS FLO-VU LARGE MISC
Status: AC
  Administered 2020-06-13: 1
  Filled 2020-06-13: qty 1

## 2020-06-13 MED ORDER — LORAZEPAM 2 MG/ML IJ SOLN
0.5000 mg | Freq: Once | INTRAMUSCULAR | Status: AC
  Administered 2020-06-13: 0.5 mg via INTRAVENOUS
  Filled 2020-06-13: qty 1

## 2020-06-13 MED ORDER — CARVEDILOL 25 MG PO TABS
25.0000 mg | ORAL_TABLET | Freq: Two times a day (BID) | ORAL | Status: DC
  Administered 2020-06-13 – 2020-06-15 (×4): 25 mg via ORAL
  Filled 2020-06-13 (×2): qty 1
  Filled 2020-06-13: qty 8
  Filled 2020-06-13: qty 1

## 2020-06-13 NOTE — Consult Note (Signed)
Renal Service Consult Note [facility]  Marvin Roberson 06/13/2020 [name] Requesting Physician:  Dr Evette Doffing, DT.  Reason for Consult:  ESRD pt w/ resp distress, missed HD and COVID+ HPI: The patient is a 49 y.o. year-old w/ hx of CVA, HTN, HL, ESRD on HD, depression, adrenal insuff who presented to ED last night for 24 hx of fevers, prod cough and general toxic illness. Also SOB since yesterday. He missed HD Monday, last HD was Friday. +diarrhea x 3, +myalgias. Was at funeral a wk ago w/ poss exposures. In ED pt was in resp distress w/ IS pulm edema by CXR and bipap was started which improved his resp status.  We are asked to see for ESRD.   Pt deniens prod cough, or chest pain, n/v.   Using R IJ TDC as access for now, he doesn't provide any detail about this.    ROS   Past Medical History  Past Medical History:  Diagnosis Date  . Adrenal insufficiency ([place])   . Allergy   . Anxiety   . Depression   . Dyspnea    "when I have too much fluid"  . ESRD on dialysis [facility]) since 1990s   "M,W,F; [address]" (11/27/2016)  . GERD (gastroesophageal reflux disease)   . High cholesterol   . History of blood transfusion   . Hypertension   . Kidney failure   . Renal insufficiency   . Stroke [facility]) 2016   decreased vision in his left eye/notes 11/27/2016   Past Surgical History  Past Surgical History:  Procedure Laterality Date  . ANGIOPLASTY Left 12/11/2016   Procedure: ANGIOPLASTY LEFT ARM & SUBCLAVIAN ARTERY;  Surgeon: Conrad [place], MD;  Location: [place];  Service: Vascular;  Laterality: Left;  . arm surgery Left 2016   "for aneurysm"  . AV FISTULA PLACEMENT    . FALSE ANEURYSM REPAIR Left 12/11/2016   Procedure: RESECTION  BRACHIAL ARTERY;  Surgeon: Conrad Schaller, MD;  Location: [place];  Service: Vascular;  Laterality: Left;  . FALSE ANEURYSM REPAIR Left 01/14/2018   Procedure: REPAIR FALSE ANEURYSM LEFT BRACHIAL ARTERY WITH SAPHENOUS VEIN  LEFT ARTERIOVENOUS GRAFT;   Surgeon: Rosetta Posner, MD;  Location: [place];  Service: Vascular;  Laterality: Left;  . INSERTION OF DIALYSIS CATHETER Right    chest  . IR FLUORO GUIDE CV LINE RIGHT  11/17/2019  . THROMBECTOMY BRACHIAL ARTERY  12/11/2016   Procedure: THROMBECTOMY BRACHIAL ARTERY AND ULNAR;  Surgeon: Conrad [place], MD;  Location: [place];  Service: Vascular;;  . THYROIDECTOMY, PARTIAL    . UPPER EXTREMITY ANGIOGRAM Left 12/11/2016   Procedure: LEFT ARM ANGIOGRAM;  Surgeon: Conrad [place], MD;  Location: [place];  Service: Vascular;  Laterality: Left;  Marland Kitchen VEIN HARVEST Left 01/14/2018   Procedure: VEIN HARVEST LEFT SAPHENOUS VEIN;  Surgeon: Rosetta Posner, MD;  Location: [place];  Service: Vascular;  Laterality: Left;  . WOUND EXPLORATION Left 12/11/2016   Procedure: LEFT ARM BRACHIAL ARTERY WITH INTERPOSTIONAL GRAFT;  Surgeon: Conrad [place], MD;  Location: [facility];  Service: Vascular;  Laterality: Left;   Family History  Family History  Problem Relation Age of Onset  . Hypertension Other   . Cancer Brother        in leg and lungs  . Adrenal disorder Neg Hx   . Colon cancer Neg Hx   . Rectal cancer Neg Hx   . Stomach cancer Neg Hx    Social  History  reports that he has been smoking cigarettes. He has a 10.00 pack-year smoking history. He has never used smokeless tobacco. He reports previous alcohol use. He reports that he does not use drugs. Allergies  Allergies  Allergen Reactions  . Doxycycline Shortness Of Breath  . Codeine Nausea And Vomiting  . Gabapentin Other (See Comments)    Sleepiness, altered mental state  . Lisinopril Other (See Comments) and Swelling    angioedema  . Povidone-Iodine Itching  . Betadine [Povidone Iodine] Rash   Home medications Prior to Admission medications   Medication Sig Start Date End Date Taking? Authorizing Provider  atorvastatin (LIPITOR) 40 MG tablet Take 40 mg by mouth daily.  01/27/16  Yes [provider]  calcium acetate (PHOSLO) 667 MG capsule Take 2,001 mg by  mouth 3 (three) times daily with meals.  01/08/19  Yes [provider]  carvedilol (COREG) 25 MG tablet Take 25 mg by mouth 2 (two) times daily with a meal.   Yes [provider]  cetirizine (ZYRTEC) 10 MG tablet Take 10 mg by mouth daily as needed for allergies.  01/10/20  Yes [provider]  folic acid-vitamin b complex-vitamin c-selenium-zinc (DIALYVITE) 3 MG TABS tablet Take 1 tablet by mouth daily.   Yes [provider]  hydrALAZINE (APRESOLINE) 100 MG tablet Take 100 mg by mouth 3 (three) times daily.   Yes [provider]  montelukast (SINGULAIR) 10 MG tablet Take 10 mg by mouth at bedtime.   Yes [provider]  acetaminophen (TYLENOL) 500 MG tablet Take by mouth. 01/03/20 01/01/21  [provider]  amLODipine (NORVASC) 10 MG tablet Take 10 mg by mouth daily. 01/07/19   [provider]  hydrocortisone (CORTEF) 5 MG tablet TAKE 1 TABLET BY MOUTH TWICE A DAY Patient taking differently: Take 5 mg by mouth daily.  10/09/17   Charlott Rakes, MD  pantoprazole (PROTONIX) 40 MG tablet Take 40 mg by mouth daily. 11/22/18   [provider]  Sodium Sulfate-Mag Sulfate-KCl (SUTAB) [phone] MG TABS Take 1 kit by mouth as directed. BIN: [id number] PCN: CN GROUP: [id number] MEMBER ID: [id number];JHE AS CASH;NO PRIOR AUTHORIZATION 06/01/20   Mansouraty, Telford Nab., MD     Vitals:   06/13/20 1740 06/13/20 8144 06/13/20 0645 06/13/20 0700  BP: (!) 155/103  (!) 151/102 (!) 168/144  Pulse: 98  99 100  Resp: (!) 35  (!) 35 (!) 31  Temp:      TempSrc:      SpO2: (!) 77%  (!) 78% (!) 82%  Weight:  61.2 kg    Height:  _0  (1.727 m)     Exam Gen on bipap, resting, not in distress, small framed AAM No rash, cyanosis or gangrene Sclera anicteric, throat not seen  + JVD Chest bibasilar rales 1/3 up, no wheezing RRR no MRG  Abd soft ntnd no mass or ascites +bs GU normal male  MS no joint effusions or deformity Ext no LE edema,  no wounds or ulcers Neuro is alert, Ox 3 , nf R IJ TDC w/o signs of infection    Home meds:  - lipitor 40/ coreg 25 bid/ norvasc 10  - cortef 34mg qd  - protonix 40 qd  needing review > phoslo, hydralazine 100 tid/ singulair 10, sutab/ nvite/ zyrtec     OP HD: [facility]WF [place]   3.5h  350/800  59kg  2/2.5 bath  TDC RIJ  P2  Hep 2000  - hect 1  ug  - mircera 225 q 2 last 7/26     Assessment/ Plan: 1. SOB/ resp distress - esrd pt has missed dialysis and exam/ CXR suggest superimposed pulm edema. Will plan HD today whenever pt can get a bed assignment (have d/w patient placement).  He cannot dialyze upstairs w/ COVID+ status.  Get vol down w/ HD.   2. COVID + infection - per primary team 3. ESRD - on HD MWF.  Writing orders for HD tomorrow as well.  4. Adrenal insuff - cont meds, per pmd 5. HTN - cont home meds  6. Anemia ckd - last esa on 7/26, Hb 9.8, follow 7. MBD ckd - Ca 8.2, cont meds      Rob Eesha Schmaltz  MD 06/13/2020, 12:00 PM  Recent Labs  Lab 06/12/20 1031 06/13/20 0633  WBC 5.8 5.9  HGB 9.3* 9.8*   Recent Labs  Lab 06/12/20 1031 06/13/20 0633  K 5.4* 6.2*  BUN 54* 70*  CREATININE 12.78* 13.69*  CALCIUM 8.0* 8.2*

## 2020-06-13 NOTE — H&P (Signed)
Date: 06/13/2020               Patient Name:  Marvin Roberson MRN: [id number]  DOB: 19-Oct-1971 Age / Sex: 49 y.o., male   PCP: Sonia Side., FNP         Medical Service: Internal Medicine Teaching Service         Attending Physician: Dr. Evette Doffing     First Contact: Dr. Truman Hayward Pager: [phone]  Second Contact: Truman Hayward, MD, Vonna Kotyk Pager: Governor Rooks [phone])       After Hours (After 5p/  First Contact Pager: [phone]  weekends / holidays): Second Contact Pager: [phone]   Chief Complaint: Shortness of breath  History of Present Illness: Mr. Marvin Roberson is a 49 year old gentleman with medical history significant for ESRD on hemodialysis MWF for 22 years due to hypertension, adrenal insufficiency, hypertension, cirrhosis of unclear etiology, hypertensive cardiomyopathy, MGUS, pulmonary embolism in 2013, thrombocytopenia, avascular necrosis of the femoral head who presented for evaluation of shortness of breath.  [name] reports that he was in his usual state of health until Saturday when he began experiencing cough productive of clear sputum, shortness of breath and myalgia.  He had also reported of subjective fevers(T-max of 101 degrees Fahrenheit) and chills. Due to his rapid onset of symptoms, he was unable to present to his hemodialysis for his regular HD sessions.  He states that he received the first dose of his Covid vaccine 2 weeks ago.  He states that he did attend a funeral last Wednesday and believes that is why he might have contracted Covid.  He denies any sick contacts, abdominal pain, nausea, vomiting, dysgeusia or paraguesia.   In the emergency department, he was found to be hypoxic to 56%, tachycardic to 103, hypertensive with range 120s-160s/100s-140s.  He did require BiPAP with significant improvement to his respiratory status due to SpO2 in the 50s%.  He was also found to be hyperkalemic (adult chronic) of 5.4 with repeat of 6.2 and received calcium gluconate, Lokelma, bicarb and D50 with  insulin due to peaked T waves on EKG.   Lab Orders     SARS Coronavirus 2 by RT PCR (hospital order, performed in [facility] hospital lab) Nasopharyngeal Nasopharyngeal Swab     Culture, blood (routine x 2)     Comprehensive metabolic panel     CBC with Differential     Lactic acid, plasma     Renal function panel     Hepatitis C antibody     HIV Antibody (routine testing w rflx)     CBC with Differential/Platelet     Comprehensive metabolic panel     C-reactive protein     D-dimer, quantitative (not at [facility])   Meds:  Current Meds  Medication Sig  . atorvastatin (LIPITOR) 40 MG tablet Take 40 mg by mouth daily.   . calcium acetate (PHOSLO) 667 MG capsule Take 2,001 mg by mouth 3 (three) times daily with meals.   . carvedilol (COREG) 25 MG tablet Take 25 mg by mouth 2 (two) times daily with a meal.  . cetirizine (ZYRTEC) 10 MG tablet Take 10 mg by mouth daily as needed for allergies.   . folic acid-vitamin b complex-vitamin c-selenium-zinc (DIALYVITE) 3 MG TABS tablet Take 1 tablet by mouth daily.  . hydrALAZINE (APRESOLINE) 100 MG tablet Take 100 mg by mouth 3 (three) times daily.  . montelukast (SINGULAIR) 10 MG tablet Take 10 mg by mouth at bedtime.     Allergies:  Allergies as of 06/12/2020 - Review Complete 06/12/2020  Allergen Reaction Noted  . Doxycycline Shortness Of Breath 07/10/2015  . Codeine Nausea And Vomiting 10/04/2012  . Gabapentin Other (See Comments) 11/25/2016  . Lisinopril Other (See Comments) and Swelling 10/04/2012  . Povidone-iodine Itching 10/04/2012  . Betadine [povidone iodine] Rash 11/25/2016   Past Medical History:  Diagnosis Date  . Adrenal insufficiency ([place])   . Allergy   . Anxiety   . Depression   . Dyspnea    "when I have too much fluid"  . ESRD on dialysis [facility]) since 1990s   "M,W,F; [address]" (11/27/2016)  . GERD (gastroesophageal reflux disease)   . High cholesterol   . History of blood transfusion   . Hypertension   .  Kidney failure   . Renal insufficiency   . Stroke [facility]) 2016   decreased vision in his left eye/notes 11/27/2016    Family History: Does not recall if any family members have diabetes, hypertension or cardiac diseases  Social History: He has 3 children, on disability due to ESRD.  Smokes 4 pieces of cigarettes a day since age 48.  Denies alcohol use or illicit drug use.  Review of Systems: A complete ROS was negative except as per HPI.   Physical Exam: Blood pressure (!) 168/144, pulse 100, temperature 99.6 F (37.6 C), temperature source Oral, resp. rate (!) 31, height 5\' 8"  (1.727 m), weight 61.2 kg, SpO2 (!) 82 %. Physical Exam Vitals reviewed.  Constitutional:      General: He is not in acute distress.    Appearance: He is not ill-appearing or toxic-appearing.     Comments: BiPAP in place  HENT:     Mouth/Throat:     Mouth: Mucous membranes are dry.  Eyes:     General: No scleral icterus.       Right eye: No discharge.        Left eye: No discharge.     Conjunctiva/sclera: Conjunctivae normal.  Cardiovascular:     Rate and Rhythm: Tachycardia present.     Pulses: Normal pulses.     Heart sounds: No murmur heard.   Pulmonary:     Effort: No respiratory distress (Improved with BiPAP).     Breath sounds: No wheezing or rales.  Abdominal:     General: Abdomen is flat.     Tenderness: There is no abdominal tenderness. There is no guarding.  Musculoskeletal:        General: No swelling.     Cervical back: Neck supple. No rigidity.     Right lower leg: No edema.     Left lower leg: No edema.  Skin:    General: Skin is dry.  Neurological:     Mental Status: He is alert.  Psychiatric:     Comments: Scared, Anxious (Brother recently hospitalized with Cancer)     EKG: Prolonged PR interval, peaked T-waves  CXR: personally reviewed my interpretation is bilateral Lobar consolidation at the bases  Assessment & Plan by Problem: Principal Problem:   Acute respiratory  failure with hypoxia (HCC) Active Problems:   Hypertension   Monoclonal gammopathy of unknown significance (MGUS)   Adrenal insufficiency (HCC)   ESRD on hemodialysis (HCC)   Hyperkalemia, diminished renal excretion   Thrombocytopenia (HCC)   Anemia, chronic disease   Hypertensive cardiomyopathy ([place])   Hepatic cirrhosis ([place])  Mr. Cumpton is a 49 year old gentleman with medical history significant for ESRD on hemodialysis MWF for 22 years due to hypertension,  adrenal insufficiency, hypertension, cirrhosis of unclear etiology, hypertensive cardiomyopathy, MGUS, pulmonary embolism in 2013, thrombocytopenia, avascular necrosis of the femoral head here for management of acute hypoxic respiratory failure secondary to COVID-19 pneumonia with concurrent pulmonary edema.   #Acute hypoxic respiratory failure secondary to COVID-19 pneumonia w/ component of Pulmonary edema Admit patient to stepdown unit and support him from a respiratory standpoint with BiPAP, nonrebreather, nasal cannula or high flow as needed.  He is not a candidate for Tocilizumab due to recent finding of cirrhosis. Currently SpO2 is 100% on BiPAP, RR is ~25 and no signs of respiratory distress -Remdesivir day 1/5 -IV methylprednisolone day 1/10 -Follow-up daily CMP, CRP, D-dimer -Antitussive: Robitussin   #ESRD on HD MWF Last dialysis session was Friday -HD today -Follow-up nephrology recommendations   #Hyperkalemia K+ of 6.2 in the ED with EKG findings of peaked T waves.  He is status post calcium gluconate, Lokelma, bicarb and NovoLog 5U  with D50 -Follow-up renal function panel at 12 PM -Daily CMP -CBG every hour x10    #Hypertension -Home medications (Coreg 25 mg twice daily, hydralazine 100 mg 3 times daily, amlodipine 10 mg daily) -Start Coreg 25 mg twice daily, hydralazine 50 mg 3 times daily, amlodipine 10 mg daily)   #Adrenal insufficiency No evidence of adrenal crisis. -Resume home hydrocortisone 5 mg  daily in addition to IV methylprednisolone (for treatment of Covid)   #Cirrhosis #Thrombocytopenia Recently discovered to have cirrhosis and has been followed up by gastroenterology. He has had several serologic findings suggestive of possible autoimmune component versus antitrypsin deficiency syndrome.  GI has plan to perform endoscopy  evaluate for esophageal varices -Follow-up hepatitis C antibody -Continue to monitor hepatic function   #MGUS (idiopathic versus MGUS associated with occult autoimmune disease) Last note from Heme/Onc on 10/11/2015.  Recent quantitative immunoglobulin reviews elevated IgG predominant of 3150 milligrams, IgA level of 405. -Requires every 6 month monitoring with SPEP, quantitative immunoglobulin, IFE and serum light chains -Will require follow-up with hematology/oncology and rheumatology at discharge.   FEN: Renal diet VTE ppx: Subcutaneous heparin CODE STATUS: Full code  Prior to Admission Living Arrangement: Home Anticipated Discharge Location: Pending Barriers to Discharge: Treatment of Covid and hemodialysis  Dispo: Admit patient to Inpatient with expected length of stay greater than 2 midnights.  Signed: Jean Rosenthal, MD 06/13/2020, 11:40 AM  Pager: [phone] Internal Medicine Teaching Service After 5pm on weekdays and 1pm on weekends: On Call pager: [phone]

## 2020-06-13 NOTE — ED Notes (Signed)
Respiratory paged

## 2020-06-13 NOTE — ED Provider Notes (Signed)
7:56 AM Care of the patient assumed at signout.  On initial exam the patient is receiving BiPAP, with tachypnea, though no evidence for distress.  Initial labs notable for positive coronavirus result.  I discussed this case with our nephrology colleagues, and preparations are being made for dialysis today.  Patient will require admission to our internal medicine colleagues for comprehensive facilitation of care.   Carmin Muskrat, MD 06/13/20 [phone]

## 2020-06-13 NOTE — Progress Notes (Signed)
Pt refused bipap at this time. RT will continue to monitor.

## 2020-06-13 NOTE — ED Notes (Signed)
Placed pt in confinement area

## 2020-06-13 NOTE — ED Notes (Signed)
CBG 157 

## 2020-06-13 NOTE — ED Provider Notes (Signed)
[facility] EMERGENCY DEPARTMENT Provider Note   CSN: [id number] Arrival date & time: 06/12/20  2300   History Chief complaint: Shortness of breath  Marvin Roberson is a 49 y.o. male.  The history is provided by the patient.  He has history of hypertension, hyperlipidemia, end-stage renal disease on hemodialysis and comes in because of difficulty breathing.  He started having difficulty breathing yesterday.  Because of that, he missed his scheduled dialysis.  He did have a subjective fever as well as chills and sweats.  He denies nausea or vomiting but has had 3 episodes of diarrhea and feels he is dehydrated from that.  There has been a cough which is productive of some clear sputum.  He denies change in his sense of smell or taste.  He has had some generalized body aches.  He does not recall any specific sick contacts, but he was at a funeral about a week ago and had multiple exposures.  He is 2 weeks status post his first Covid vaccine dose.  Past Medical History:  Diagnosis Date  . Adrenal insufficiency ([place])   . Allergy   . Anxiety   . Depression   . Dyspnea    "when I have too much fluid"  . ESRD on dialysis [facility]) since 1990s   "M,W,F; [address]" (11/27/2016)  . GERD (gastroesophageal reflux disease)   . High cholesterol   . History of blood transfusion   . Hypertension   . Kidney failure   . Renal insufficiency   . Stroke [facility]) 2016   decreased vision in his left eye/notes 11/27/2016    Patient Active Problem List   Diagnosis Date Noted  . Hepatic cirrhosis ([place]) 03/09/2020  . Encounter for other preprocedural examination 03/09/2020  . Chronic nonintractable headache 01/28/2019  . Thrombocytopenia ([place]) 08/13/2018  . ESRD on dialysis ([place])   . ESRD (end stage renal disease) ([place]) 01/14/2018  . Hyperkalemia, diminished renal excretion 10/20/2017  . Post-operative complication 95/18/8416  . 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[place])   . ESRD on hemodialysis ([place])   .  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 Adrenal insufficiency ([place])   . Hypoglycemia due to insulin 11/27/2016  . Hypertension 11/27/2016  . History of CVA (cerebrovascular accident) 11/27/2016  . Monoclonal gammopathy of unknown significance (MGUS) 11/27/2016  . Bradycardia 02/26/2016  . Hypertensive cardiomyopathy ([place]) 01/22/2016  . Hypertensive kidney disease with ESRD (end-stage renal disease) ([place]) 01/22/2016  . Staphylococcus aureus bacteremia 01/22/2016  . Hydrocele 12/01/2015  . CAD (coronary artery disease) 07/24/2015  . 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 Anticoagulation goal of INR 2 to 3 07/05/2014  . Avascular necrosis of femoral head ([place]) 07/05/2014  . Intestinal occlusion ([place]) 07/05/2014  . Weakness of both hips 07/05/2014  . Anemia, chronic disease 03/18/2014  . Hyponatremia 03/18/2014  . Retroperitoneal lymphadenopathy 03/18/2014  . Weight loss 03/03/2014  . Sepsis ([place]) 03/30/2013  . Migraine headache 10/11/2012  . Pulmonary embolism ([place]) 10/06/2012  . DVT of upper extremity (deep vein thrombosis) ([place]) 10/05/2012  . Cellulitis of left arm 10/04/2012    Past Surgical History:  Procedure Laterality Date  . ANGIOPLASTY Left 12/11/2016   Procedure: ANGIOPLASTY LEFT ARM & SUBCLAVIAN ARTERY;  Surgeon: Conrad Atkins, MD;  Location: [place];  Service: Vascular;  Laterality: Left;  . arm surgery Left 2016   "for aneurysm"  . AV FISTULA PLACEMENT    . FALSE ANEURYSM REPAIR Left 12/11/2016  Procedure: RESECTION  BRACHIAL ARTERY;  Surgeon: Conrad Rio, MD;  Location: [place];  Service: Vascular;  Laterality: Left;  . FALSE ANEURYSM REPAIR Left 01/14/2018   Procedure: REPAIR FALSE ANEURYSM LEFT BRACHIAL ARTERY WITH SAPHENOUS VEIN  LEFT ARTERIOVENOUS GRAFT;  Surgeon: Rosetta Posner, MD;  Location: [place];  Service: Vascular;  Laterality: Left;  . INSERTION OF DIALYSIS CATHETER Right    chest  . IR FLUORO GUIDE CV LINE RIGHT  11/17/2019  .  THROMBECTOMY BRACHIAL ARTERY  12/11/2016   Procedure: THROMBECTOMY BRACHIAL ARTERY AND ULNAR;  Surgeon: Conrad Le Center, MD;  Location: [place];  Service: Vascular;;  . THYROIDECTOMY, PARTIAL    . UPPER EXTREMITY ANGIOGRAM Left 12/11/2016   Procedure: LEFT ARM ANGIOGRAM;  Surgeon: Conrad Almond, MD;  Location: [place];  Service: Vascular;  Laterality: Left;  Marland Kitchen VEIN HARVEST Left 01/14/2018   Procedure: VEIN HARVEST LEFT SAPHENOUS VEIN;  Surgeon: Rosetta Posner, MD;  Location: [place];  Service: Vascular;  Laterality: Left;  . WOUND EXPLORATION Left 12/11/2016   Procedure: LEFT ARM BRACHIAL ARTERY WITH INTERPOSTIONAL GRAFT;  Surgeon: Conrad Deer River, MD;  Location[facility] OR;  Service: Vascular;  Laterality: Left;       Family History  Problem Relation Age of Onset  . Hypertension Other   . Cancer Brother        in leg and lungs  . Adrenal disorder Neg Hx   . Colon cancer Neg Hx   . Rectal cancer Neg Hx   . Stomach cancer Neg Hx     Social History   Tobacco Use  . Smoking status: Current Every Day Smoker    Packs/day: 0.50    Years: 20.00    Pack years: 10.00    Types: Cigarettes  . Smokeless tobacco: Never Used  . Tobacco comment: 1 pk every 2 days.  Vaping Use  . Vaping Use: Never used  Substance Use Topics  . Alcohol use: Not Currently  . Drug use: No    Home Medications Prior to Admission medications   Medication Sig Start Date End Date Taking? Authorizing Provider  atorvastatin (LIPITOR) 40 MG tablet Take 40 mg by mouth daily.  01/27/16  Yes [provider]  calcium acetate (PHOSLO) 667 MG capsule Take 2,001 mg by mouth 3 (three) times daily with meals.  01/08/19  Yes [provider]  carvedilol (COREG) 25 MG tablet Take 25 mg by mouth 2 (two) times daily with a meal.   Yes [provider]  cetirizine (ZYRTEC) 10 MG tablet Take 10 mg by mouth daily as needed for allergies.  01/10/20  Yes [provider]  folic acid-vitamin b complex-vitamin c-selenium-zinc  (DIALYVITE) 3 MG TABS tablet Take 1 tablet by mouth daily.   Yes [provider]  hydrALAZINE (APRESOLINE) 100 MG tablet Take 100 mg by mouth 3 (three) times daily.   Yes [provider]  montelukast (SINGULAIR) 10 MG tablet Take 10 mg by mouth at bedtime.   Yes [provider]  pantoprazole (PROTONIX) 40 MG tablet Take 40 mg by mouth daily. 11/22/18  Yes [provider]  acetaminophen (TYLENOL) 500 MG tablet Take by mouth. 01/03/20 01/01/21  [provider]  amLODipine (NORVASC) 10 MG tablet Take 10 mg by mouth daily. 01/07/19   [provider]  hydrocortisone (CORTEF) 5 MG tablet TAKE 1 TABLET BY MOUTH TWICE A DAY Patient taking differently: Take 5 mg by mouth daily.  10/09/17   Newlin,  Enobong, MD  Sodium Sulfate-Mag Sulfate-KCl (SUTAB) [phone] MG TABS Take 1 kit by mouth as directed. BIN: [id number] PCN: CN GROUP: [id number] MEMBER ID: [id number];OQH AS CASH;NO PRIOR AUTHORIZATION 06/01/20   Mansouraty, Telford Nab., MD    Allergies    Doxycycline, Codeine, Gabapentin, Lisinopril, Povidone-iodine, and Betadine [povidone iodine]  Review of Systems   Review of Systems  All other systems reviewed and are negative.   Physical Exam Updated Vital Signs BP (!) 124/101   Pulse (!) 103   Temp 99.6 F (37.6 C) (Oral)   Resp 18   SpO2 (!) 56%   Physical Exam Vitals and nursing note reviewed.   49 year old male, resting comfortably and in no acute distress. Vital signs are significant for elevated diastolic blood pressure and mildly elevated heart rate. Oxygen saturation is 56%, which is hypoxic.  In spite of hypoxia, patient appears comfortable and is not using accessory muscles of respiration. Head is normocephalic and atraumatic. PERRLA, EOMI. Oropharynx is clear. Neck is nontender and supple without adenopathy. JVD is present at 90 degrees. Back is nontender and there is no CVA tenderness. Lungs have coarse expiratory rhonchi which are  worse on the right.  There are no rales or wheezes. Chest is nontender.  Dialysis access catheters are present on the right. Heart has regular rate and rhythm without murmur. Abdomen is soft, flat, nontender without masses or hepatosplenomegaly and peristalsis is normoactive. Extremities have no cyanosis or edema, full range of motion is present. Skin is warm and dry without rash. Neurologic: Mental status is normal, cranial nerves are intact, there are no motor or sensory deficits.  ED Results / Procedures / Treatments   Labs (all labs ordered are listed, but only abnormal results are displayed) Labs Reviewed  SARS CORONAVIRUS 2 BY RT PCR ([place] [facility]) - Abnormal; Notable for the following components:      Result Value   SARS Coronavirus 2 POSITIVE (*)    All other components within normal limits  CULTURE, BLOOD (ROUTINE X 2)  CULTURE, BLOOD (ROUTINE X 2)  COMPREHENSIVE METABOLIC PANEL  CBC WITH DIFFERENTIAL/PLATELET  LACTIC ACID, PLASMA  LACTIC ACID, PLASMA    EKG EKG Interpretation  Date/Time:  Tuesday June 13 2020 06:50:45 EDT Ventricular Rate:  100 PR Interval:    QRS Duration: 133 QT Interval:  359 QTC Calculation: 463 R Axis:   -82 Text Interpretation: Sinus tachycardia Nonspecific IVCD with LAD Nonspecific T abnormalities, lateral leads Borderline ST elevation, anterolateral leads When compared with ECG of EARLIER SAME DATE No significant change was found Confirmed by Delora Fuel ([id number]) on 06/13/2020 7:01:51 AM   Radiology DG Chest 2 View  Result Date: 06/12/2020 CLINICAL DATA:  Shortness of breath and weakness. EXAM: CHEST - 2 VIEW COMPARISON:  04/14/2018 FINDINGS: The right IJ dialysis catheter is in good position. Mild cardiac enlargement. Perihilar vascular congestion and pulmonary edema. No infiltrates or effusions. Left axillary graft is noted. The bony structures are intact. IMPRESSION: CHF.  No pleural effusions or  infiltrates. Electronically Signed   By: Marijo Sanes M.D.   On: 06/12/2020 10:49    Procedures Procedures  CRITICAL CARE Performed by: Delora Fuel Total critical care time: 50 minutes Critical care time was exclusive of separately billable procedures and treating other patients. Critical care was necessary to treat or prevent imminent or life-threatening deterioration. Critical care was time spent personally by me on the following activities: development of treatment plan with patient and/or  surrogate as well as nursing, discussions with consultants, evaluation of patient's response to treatment, examination of patient, obtaining history from patient or surrogate, ordering and performing treatments and interventions, ordering and review of laboratory studies, ordering and review of radiographic studies, pulse oximetry and re-evaluation of patient's condition.  Medications Ordered in ED Medications - No data to display  ED Course  I have reviewed the triage vital signs and the nursing notes.  Pertinent labs & imaging results that were available during my care of the patient were reviewed by me and considered in my medical decision making (see chart for details).  MDM Rules/Calculators/A&P Dyspnea with hypoxia.  This appears to be multifactorial.  He is likely fluid overloaded from having missed dialysis yesterday.  On review of old records, he did have a low-grade fever of 101.9 when he was in the ED yesterday (he left without being seen).  He was not hypoxic at that time.  Chest x-ray taken yesterday did show changes of congestive heart failure.  COVID-19 test done during this ED visit is positive.  That certainly could be contributing to his hypoxia as well.  He is placed on 100% oxygen, and oxygen saturation has come up to 78%.  Will put on BiPAP.  He will need emergent dialysis today.  Case is discussed with Dr. Jonnie Finner of nephrology service who agrees to dialyze the patient.  He request  patient be admitted to the hospitalists service.  Labs are pending.  Case is signed out to Dr. Vanita Panda to arrange admission once labs are back.  7:31 AM Oxygen saturation is up to 96% on BiPAP.  However, patient is complaining of feeling claustrophobic.  He will be given low-dose lorazepam to try to ease his claustrophobia.  Final Clinical Impression(s) / ED Diagnoses Final diagnoses:  Hypoxia  Acute pulmonary edema ([place])  COVID-19 virus infection  End-stage renal disease on hemodialysis [facility])    Rx / DC Orders ED Discharge Orders    None       Delora Fuel, MD 01/22/21 [phone]

## 2020-06-14 DIAGNOSIS — U071 COVID-19: Principal | ICD-10-CM

## 2020-06-14 DIAGNOSIS — J1282 Pneumonia due to coronavirus disease 2019: Secondary | ICD-10-CM

## 2020-06-14 LAB — RENAL FUNCTION PANEL
Albumin: 2.6 g/dL — ABNORMAL LOW (ref 3.5–5.0)
Anion gap: 14 (ref 5–15)
BUN: 51 mg/dL — ABNORMAL HIGH (ref 6–20)
CO2: 25 mmol/L (ref 22–32)
Calcium: 7.6 mg/dL — ABNORMAL LOW (ref 8.9–10.3)
Chloride: 99 mmol/L (ref 98–111)
Creatinine, Ser: 9.57 mg/dL — ABNORMAL HIGH (ref 0.61–1.24)
GFR calc Af Amer: 7 mL/min — ABNORMAL LOW (ref >60)
GFR calc non Af Amer: 6 mL/min — ABNORMAL LOW (ref >60)
Glucose, Bld: 151 mg/dL — ABNORMAL HIGH (ref 70–99)
Phosphorus: 3.9 mg/dL (ref 2.5–4.6)
Potassium: 4 mmol/L (ref 3.5–5.1)
Sodium: 138 mmol/L (ref 135–145)

## 2020-06-14 MED ORDER — HEPARIN SODIUM (PORCINE) 1000 UNIT/ML IJ SOLN
INTRAMUSCULAR | Status: AC
  Filled 2020-06-14: qty 6

## 2020-06-14 MED ORDER — DEXAMETHASONE 6 MG PO TABS
6.0000 mg | ORAL_TABLET | Freq: Every day | ORAL | Status: DC
  Administered 2020-06-15 – 2020-06-16 (×2): 6 mg via ORAL
  Filled 2020-06-14 (×2): qty 1

## 2020-06-14 NOTE — Progress Notes (Signed)
   S: Patient feeling better today.  Breathing stable.  Having cough productive of sputum with tinge of blood.  No chest pain, no abdominal pain.  O:  Vitals:   06/13/20 1845 06/13/20 1915  BP: (!) 165/84 (!) 159/89  Pulse: 71 71  Resp: (!) 23 18  Temp:  98.5 F (36.9 C)  SpO2: 96% 99%   Gen: Well-appearing, no distress lying in bed Lungs: Unlabored Ext: Warm and well-perfused, no lower extremity edema Chest: Right-sided tunneled HD line intact with no signs of infection Psych: Normal affect, not depressed or anxious appearing  A/P:   Principal Problem:   Pneumonia due to COVID-19 virus Active Problems:   Adrenal insufficiency (HCC)   ESRD on hemodialysis (HCC)   Hyperkalemia, diminished renal excretion  Pneumonia due to COVID-19 virus: Symptomatically stable, requiring 4 L supplemental oxygen via nasal cannula.  Plan to continue with 5 days of remdesivir and at least 5 days of dexamethasone.  May be eligible to transition to outpatient infusions if he shows stability over today.  I think minor hemoptysis is coming from airway inflammation, less likely from pulmonary embolism.  Plan to check inflammatory markers today, and procalcitonin.  Encouraged out of bed activity and self proning.  Encouraged incentive spirometer and Acapella valve.  End-stage renal disease on hemodialysis: Appears euvolemic today, respiratory status much improved.  Will check BMP today given hyperkalemia seen prior to HD session yesterday.  Patient feels that he symptomatically cannot do another HD session today.  Usually on Monday Wednesday Friday schedule.  Greatly appreciate nephrology management of his HD.  Patient is eager to discharge to home, may be difficult coordinating outpatient remdesivir infusions with his HD schedule.  Adrenal insufficiency: Blood pressure has been stable, on stress dose steroids with solumedrol 30mg  bid daily right now.  Can change steroids to dexamethasone 6 mg oral daily.  Home  dose of hydrocortisone is 5 mg twice daily.  Can plan to transition back to that dosing after he completes the dexamethasone course.  DVT PPx: Heparin 5000 units subcutaneous every 8 hours while admitted  Dispo: Patient eager to get home.  Would like to see more clinical stability over the course of today, may be eligible for remdesivir outpatient infusions to complete his course however this will have to be coordinated with his HD schedule.   Axel Filler, MD 06/14/2020, 10:33 AM

## 2020-06-14 NOTE — Progress Notes (Signed)
Patient said he does not wear any kind of cpap at home and he will be fine without it. His sats are at 48 and he is resting well. RT will continue to monitor.

## 2020-06-14 NOTE — Progress Notes (Signed)
[facility] Progress Note  Subjective: per pmd notes breathing is much better. Stand wt today is down to 55.8kg (dry wt was 59kg), which is 3.2kg under.    Vitals:   06/13/20 1845 06/13/20 1915 06/14/20 1420 06/14/20 1534  BP: (!) 165/84 (!) 159/89 (!) 145/86 140/80  Pulse: 71 71 62 64  Resp: (!) 23 18 20 20   Temp:  98.5 F (36.9 C) 97.6 F (36.4 C) 97.6 F (36.4 C)  TempSrc:  Oral Oral Oral  SpO2: 96% 99%    Weight:  58.3 kg    Height:  5\' 9"  (1.753 m)      Exam:  Patient not examined today directly given COVID-19 + status, utilizing data taken from chart +/- discussions w/ providers and staff.    Home meds:  - lipitor 40/ coreg 25 bid/ norvasc 10  - cortef 80m g qd  - protonix 40 qd  needing review > phoslo, hydralazine 100 tid/ singulair 10, sutab/ nvite/ zyrtec     [facility] HD[facility]   3.5h  350/800  59kg  2/2.5 bath  TDC RIJ  P2  Hep 2000  - hect 1 ug  - mircera 225 q 2 last 7/26     Assessment/ Plan: 1. SOB/ resp distress - esrd pt has missed dialysis and exam/ CXR suggest superimposed pulm edema. Had HD yest w/ about 1.7 L removed. Breathing reported to be muc better today. Standing wt shows he is now 3.2kg (55.8kg) under his prior dry wt. He is refusing UF today, keeping even on HD today.  2. COVID + infection - per primary team 3. ESRD - on HD MWF. HD again today to get back on schedule.  4. Adrenal insuff - cont meds, per pmd 5. HTN - cont home meds  6. Anemia ckd - last esa on 7/26, Hb 9.8, follow 7. MBD ckd - Ca 8.2, cont meds       Rob Zi [place] 06/14/2020, 3:55 PM   Recent Labs  Lab 06/12/20 1031 06/12/20 1031 06/13/20 0633 06/13/20 1210  K 5.4*   < > 6.2* 6.1*  BUN 54*   < > 70* 74*  CREATININE 12.78*   < > 13.69* 14.23*  CALCIUM 8.0*   < > 8.2* 7.7*  PHOS  --   --   --  5.6*  HGB 9.3*  --  9.8*  --    < > = values in this interval not displayed.   Inpatient medications: . albuterol  2 puff Inhalation Q6H  .  amLODipine  10 mg Oral Daily  . vitamin C  500 mg Oral Daily  . atorvastatin  40 mg Oral Daily  . carvedilol  25 mg Oral BID WC  . Chlorhexidine Gluconate Cloth  6 each Topical Daily  . [START ON 06/15/2020] dexamethasone  6 mg Oral Daily  . heparin  5,000 Units Subcutaneous Q8H  . heparin sodium (porcine)      . hydrALAZINE  50 mg Oral Q8H  . hydrocortisone  5 mg Oral Daily  . insulin aspart  5 Units Intravenous Once  . methylPREDNISolone (SOLU-MEDROL) injection  0.5 mg/kg Intravenous BID  . pantoprazole  40 mg Oral Daily  . zinc sulfate  220 mg Oral Daily   . remdesivir 100 mg in NS 100 mL 100 mg (06/14/20 1014)   guaiFENesin-dextromethorphan, heparin, melatonin, polyethylene glycol

## 2020-06-15 ENCOUNTER — Inpatient Hospital Stay (HOSPITAL_COMMUNITY): Payer: Medicare Other

## 2020-06-15 LAB — COMPREHENSIVE METABOLIC PANEL
ALT: 11 U/L (ref 0–44)
AST: 25 U/L (ref 15–41)
Albumin: 2.6 g/dL — ABNORMAL LOW (ref 3.5–5.0)
Alkaline Phosphatase: 133 U/L — ABNORMAL HIGH (ref 38–126)
Anion gap: 14 (ref 5–15)
BUN: 37 mg/dL — ABNORMAL HIGH (ref 6–20)
CO2: 27 mmol/L (ref 22–32)
Calcium: 7.8 mg/dL — ABNORMAL LOW (ref 8.9–10.3)
Chloride: 97 mmol/L — ABNORMAL LOW (ref 98–111)
Creatinine, Ser: 6.69 mg/dL — ABNORMAL HIGH (ref 0.61–1.24)
GFR calc Af Amer: 10 mL/min — ABNORMAL LOW (ref >60)
GFR calc non Af Amer: 9 mL/min — ABNORMAL LOW (ref >60)
Glucose, Bld: 113 mg/dL — ABNORMAL HIGH (ref 70–99)
Potassium: 3.9 mmol/L (ref 3.5–5.1)
Sodium: 138 mmol/L (ref 135–145)
Total Bilirubin: 0.7 mg/dL (ref 0.3–1.2)
Total Protein: 7.7 g/dL (ref 6.5–8.1)

## 2020-06-15 LAB — CBC WITH DIFFERENTIAL/PLATELET
Abs Immature Granulocytes: 0.02 10*3/uL (ref 0.00–0.07)
Basophils Absolute: 0 10*3/uL (ref 0.0–0.1)
Basophils Relative: 0 %
Eosinophils Absolute: 0 10*3/uL (ref 0.0–0.5)
Eosinophils Relative: 0 %
HCT: 27.8 % — ABNORMAL LOW (ref 39.0–52.0)
Hemoglobin: 8.7 g/dL — ABNORMAL LOW (ref 13.0–17.0)
Immature Granulocytes: 0 %
Lymphocytes Relative: 5 %
Lymphs Abs: 0.2 10*3/uL — ABNORMAL LOW (ref 0.7–4.0)
MCH: 26.6 pg (ref 26.0–34.0)
MCHC: 31.3 g/dL (ref 30.0–36.0)
MCV: 85 fL (ref 80.0–100.0)
Monocytes Absolute: 0.1 10*3/uL (ref 0.1–1.0)
Monocytes Relative: 2 %
Neutro Abs: 4.9 10*3/uL (ref 1.7–7.7)
Neutrophils Relative %: 93 %
Platelets: 123 10*3/uL — ABNORMAL LOW (ref 150–400)
RBC: 3.27 MIL/uL — ABNORMAL LOW (ref 4.22–5.81)
RDW: 20.4 % — ABNORMAL HIGH (ref 11.5–15.5)
WBC: 5.2 10*3/uL (ref 4.0–10.5)
nRBC: 0 % (ref 0.0–0.2)

## 2020-06-15 LAB — C-REACTIVE PROTEIN: CRP: 11.6 mg/dL — ABNORMAL HIGH (ref <1.0)

## 2020-06-15 LAB — D-DIMER, QUANTITATIVE: D-Dimer, Quant: 5.48 ug/mL-FEU — ABNORMAL HIGH (ref 0.00–0.50)

## 2020-06-15 MED ORDER — ACETAMINOPHEN 325 MG PO TABS
650.0000 mg | ORAL_TABLET | Freq: Once | ORAL | Status: AC
  Administered 2020-06-15: 650 mg via ORAL
  Filled 2020-06-15: qty 2

## 2020-06-15 NOTE — Progress Notes (Signed)
   S: Feeling a little better today.  Still with a productive cough, small specks of blood in the sputum still.  Breathing is stable, little improved.  Had some trouble getting through HD yesterday.  Good appetite.  O:  Vitals:   06/15/20 0801 06/15/20 1200  BP: (!) 133/93 (!) 169/84  Pulse: (!) 56 (!) 56  Resp: 17 15  Temp: 97.6 F (36.4 C) 97.6 F (36.4 C)  SpO2: 100% 99%   Gen: Well-appearing young man, no distress Lungs: Unlabored, mild fine crackles at the right base, otherwise clear to auscultation, requiring 3 L nasal cannula Ext: Warm and well-perfused with no lower extremity edema Psych: Appropriate affect, not anxious or depressed appearing  A/P: 49 year old person living with end-stage renal disease on hemodialysis through a right HD catheter admitted with acute hypoxic respiratory failure due to a combination of ESRD with volume overload and COVID-19 pneumonia.  COVID-19 pneumonia: Seems to be improving well with time.  Fever curve has trended down nicely.  Today is day 3 of remdesivir and dexamethasone, will continue.  Very elevated inflammatory markers.  And a chest x-ray today shows improvement after 2 serial HD sessions, so some of that infiltrate was pulmonary edema due to the ESRD.  May be eligible for discharge tomorrow after another HD session, could complete last remdesivir infusion at the outpatient infusion center.  Adrenal insufficiency: Symptomatically stable.  Now on dexamethasone 6 mg daily.  He has been mostly hypertensive.  We talked about the plan to transition back to his home hydrocortisone dosing after he completes at least a 5-day course of dexamethasone.  ESRD: Greatly appreciate nephrology management of his end-stage renal disease.  He is back onto his Monday Wednesday Friday schedule.  We will need to make sure his dialysis center can still give him HD with the recent positive Covid test.  DVT PPx: Patient is at increased risk of DVT.  He is prescribed  heparin subcutaneous 3 times daily.  He has declined this medicine for last 2 days. I talked with pharmacy, not safe to give xarelto when CrCl < 15.   Regular diet  Anticipate discharge to home either Friday after dialysis for Saturday morning after final remdesivir infusion.   Axel Filler, MD 06/15/2020, 3:27 PM

## 2020-06-15 NOTE — Progress Notes (Signed)
[facility] Progress Note  Subjective: F/U cxr shows resolved CHF/ edema. Pt seen in room, no c/o. On nasal O2.   Vitals:   06/15/20 0000 06/15/20 0400 06/15/20 0801 06/15/20 1200  BP:  (!) 148/82 (!) 133/93 (!) 169/84  Pulse: 60 64 (!) 56 (!) 56  Resp:  14 17 15   Temp:   97.6 F (36.4 C) 97.6 F (36.4 C)  TempSrc:   Oral Axillary  SpO2: 95% 95% 100% 99%  Weight:      Height:        Exam: Gen on [place] O2, in good spirits, no distress Chest soft bibasilar rales , < 1/4 up bilat RRR no MRG  Abd soft ntnd no mass or ascites +bs MS no joint effusions or deformity Ext no LE edema, no wounds or ulcers Neuro is alert, Ox 3 , nf R IJ TDC w/o signs of infection   Home meds:  - lipitor 40/ coreg 25 bid/ norvasc 10  - cortef 9m g qd  - protonix 40 qd  needing review > phoslo, hydralazine 100 tid/ singulair 10, sutab/ nvite/ zyrtec     [facility] HD[facility]   3.5h  350/800  59kg  2/2.5 bath  TDC RIJ  P2  Hep 2000  - hect 1 ug  - mircera 225 q 2 last 7/26     Assessment/ Plan: 1. SOB/ resp distress - esrd pt had missed dialysis and exam/ CXR suggest superimposed pulm edema. He was also COVID+. He rec'd HD here x 2 w/ total UF 2.5 L and dry wt is now down about 3-4 kg. Was on bipap and now in 3L [place]. F/U CXR today shows resolution of pulm edema. He is still requiring some O2 3 L [place], this may be due to [place]. Have d/w primary team.  2. COVID + infection - per primary team 3. ESRD - on HD MWF. HD tomorrow.  4. Adrenal insuff - cont meds, per pmd 5. HTN/vol  - cont home meds, BP's normal to high. Try to lower dry wt further tomorrow (although pt generally is very resistant to this).  6. Anemia ckd - last esa on 7/26, Hb 9.8, follow 7. MBD ckd - Ca 8.2, cont meds       Rob Ukiah Trawick 06/15/2020, 12:56 PM   Recent Labs  Lab 06/13/20 0633 06/13/20 0633 06/13/20 1210 06/13/20 1210 06/14/20 1625 06/15/20 0744  K 6.2*   < > 6.1*   < > 4.0 3.9  BUN 70*   <  > 74*   < > 51* 37*  CREATININE 13.69*   < > 14.23*   < > 9.57* 6.69*  CALCIUM 8.2*   < > 7.7*   < > 7.6* 7.8*  PHOS  --   --  5.6*  --  3.9  --   HGB 9.8*  --   --   --   --  8.7*   < > = values in this interval not displayed.   Inpatient medications: . albuterol  2 puff Inhalation Q6H  . amLODipine  10 mg Oral Daily  . vitamin C  500 mg Oral Daily  . atorvastatin  40 mg Oral Daily  . carvedilol  25 mg Oral BID WC  . Chlorhexidine Gluconate Cloth  6 each Topical Daily  . dexamethasone  6 mg Oral Daily  . heparin  5,000 Units Subcutaneous Q8H  . hydrALAZINE  50 mg Oral Q8H  . hydrocortisone  5 mg  Oral Daily  . insulin aspart  5 Units Intravenous Once  . pantoprazole  40 mg Oral Daily  . zinc sulfate  220 mg Oral Daily   . remdesivir 100 mg in NS 100 mL 100 mg (06/15/20 1115)   guaiFENesin-dextromethorphan, heparin, melatonin, polyethylene glycol

## 2020-06-16 LAB — COMPREHENSIVE METABOLIC PANEL
ALT: 10 U/L (ref 0–44)
AST: 25 U/L (ref 15–41)
Albumin: 2.4 g/dL — ABNORMAL LOW (ref 3.5–5.0)
Alkaline Phosphatase: 122 U/L (ref 38–126)
Anion gap: 12 (ref 5–15)
BUN: 61 mg/dL — ABNORMAL HIGH (ref 6–20)
CO2: 24 mmol/L (ref 22–32)
Calcium: 7.3 mg/dL — ABNORMAL LOW (ref 8.9–10.3)
Chloride: 100 mmol/L (ref 98–111)
Creatinine, Ser: 8.33 mg/dL — ABNORMAL HIGH (ref 0.61–1.24)
GFR calc Af Amer: 8 mL/min — ABNORMAL LOW (ref >60)
GFR calc non Af Amer: 7 mL/min — ABNORMAL LOW (ref >60)
Glucose, Bld: 111 mg/dL — ABNORMAL HIGH (ref 70–99)
Potassium: 4.1 mmol/L (ref 3.5–5.1)
Sodium: 136 mmol/L (ref 135–145)
Total Bilirubin: 0.5 mg/dL (ref 0.3–1.2)
Total Protein: 7 g/dL (ref 6.5–8.1)

## 2020-06-16 LAB — D-DIMER, QUANTITATIVE: D-Dimer, Quant: 7.3 ug/mL-FEU — ABNORMAL HIGH (ref 0.00–0.50)

## 2020-06-16 LAB — CBC WITH DIFFERENTIAL/PLATELET
Abs Immature Granulocytes: 0.07 10*3/uL (ref 0.00–0.07)
Basophils Absolute: 0 10*3/uL (ref 0.0–0.1)
Basophils Relative: 0 %
Eosinophils Absolute: 0 10*3/uL (ref 0.0–0.5)
Eosinophils Relative: 0 %
HCT: 27.5 % — ABNORMAL LOW (ref 39.0–52.0)
Hemoglobin: 8.7 g/dL — ABNORMAL LOW (ref 13.0–17.0)
Immature Granulocytes: 1 %
Lymphocytes Relative: 4 %
Lymphs Abs: 0.2 10*3/uL — ABNORMAL LOW (ref 0.7–4.0)
MCH: 27.2 pg (ref 26.0–34.0)
MCHC: 31.6 g/dL (ref 30.0–36.0)
MCV: 85.9 fL (ref 80.0–100.0)
Monocytes Absolute: 0.2 10*3/uL (ref 0.1–1.0)
Monocytes Relative: 4 %
Neutro Abs: 5.4 10*3/uL (ref 1.7–7.7)
Neutrophils Relative %: 91 %
Platelets: 131 10*3/uL — ABNORMAL LOW (ref 150–400)
RBC: 3.2 MIL/uL — ABNORMAL LOW (ref 4.22–5.81)
RDW: 21 % — ABNORMAL HIGH (ref 11.5–15.5)
WBC Morphology: INCREASED
WBC: 5.9 10*3/uL (ref 4.0–10.5)
nRBC: 0 % (ref 0.0–0.2)

## 2020-06-16 LAB — C-REACTIVE PROTEIN: CRP: 8.5 mg/dL — ABNORMAL HIGH (ref <1.0)

## 2020-06-16 LAB — MRSA PCR SCREENING: MRSA by PCR: NEGATIVE

## 2020-06-16 MED ORDER — DEXAMETHASONE 6 MG PO TABS: 6.0000 mg | ORAL_TABLET | Freq: Every day | ORAL | 0 refills | Status: AC

## 2020-06-16 MED ORDER — HYDROCORTISONE 5 MG PO TABS: 5.0000 mg | ORAL_TABLET | Freq: Two times a day (BID) | ORAL | 0 refills | Status: DC

## 2020-06-16 MED ORDER — HEPARIN SODIUM (PORCINE) 1000 UNIT/ML DIALYSIS: 2000.0000 [IU] | Freq: Once | INTRAMUSCULAR | Status: DC

## 2020-06-16 MED ORDER — HEPARIN SODIUM (PORCINE) 1000 UNIT/ML IJ SOLN
INTRAMUSCULAR | Status: AC
  Filled 2020-06-16: qty 1

## 2020-06-16 MED ORDER — GUAIFENESIN-DM 100-10 MG/5ML PO SYRP: 10.0000 mL | ORAL_SOLUTION | ORAL | 0 refills | Status: DC | PRN

## 2020-06-16 MED FILL — DEXAMETHASONE 6 MG TABLET: 6 | 5 days supply | Qty: 5 | Fill #0

## 2020-06-16 MED FILL — SM TUSSIN DM SYRUP: 100-10 | 5 days supply | Qty: 236 | Fill #0

## 2020-06-16 NOTE — Discharge Summary (Signed)
Name: Marvin Roberson MRN: [id number] DOB: 1971/10/26 49 y.o. PCP: Sonia Side., FNP  Date of Admission: 06/12/2020 11:35 PM Date of Discharge: 06/16/2020 Attending Physician: Lalla Brothers MD  Discharge Diagnosis: 1. COVID pneumonia 2. End Stage Renal Disease  Discharge Medications: Allergies as of 06/16/2020      Reactions   Doxycycline Shortness Of Breath   Codeine Nausea And Vomiting   Gabapentin Other (See Comments)   Sleepiness, altered mental state   Lisinopril Other (See Comments), Swelling   angioedema   Povidone-iodine Itching   Betadine [povidone Iodine] Rash      Medication List    STOP taking these medications   Sutab [phone] MG Tabs Generic drug: Sodium Sulfate-Mag Sulfate-KCl     TAKE these medications   acetaminophen 500 MG tablet Commonly known as: TYLENOL Take 500 mg by mouth every 6 (six) hours as needed for mild pain.   amLODipine 10 MG tablet Commonly known as: NORVASC Take 10 mg by mouth at bedtime.   atorvastatin 40 MG tablet Commonly known as: LIPITOR Take 40 mg by mouth daily.   calcium acetate 667 MG capsule Commonly known as: PHOSLO Take 2,001 mg by mouth 3 (three) times daily with meals.   carvedilol 25 MG tablet Commonly known as: COREG Take 25 mg by mouth 2 (two) times daily with a meal.   cetirizine 10 MG tablet Commonly known as: ZYRTEC Take 10 mg by mouth daily as needed for allergies.   dexamethasone 6 MG tablet Commonly known as: DECADRON Take 1 tablet (6 mg total) by mouth daily for 5 days.   folic acid-vitamin b complex-vitamin c-selenium-zinc 3 MG Tabs tablet Take 1 tablet by mouth daily.   guaiFENesin-dextromethorphan 100-10 MG/5ML syrup Commonly known as: ROBITUSSIN DM Take 10 mLs by mouth every 4 (four) hours as needed for cough.   hydrALAZINE 100 MG tablet Commonly known as: APRESOLINE Take 100 mg by mouth 3 (three) times daily.   hydrocortisone 5 MG tablet Commonly known as: CORTEF Take 1 tablet  (5 mg total) by mouth 2 (two) times daily. Start after finishing your course of dexamethasone What changed: additional instructions   montelukast 10 MG tablet Commonly known as: SINGULAIR Take 10 mg by mouth at bedtime.   pantoprazole 40 MG tablet Commonly known as: PROTONIX Take 40 mg by mouth daily.       Disposition and follow-up:   Marvin Roberson was discharged from [facility] in Stable condition.  At the hospital follow up visit please address:  1.  COVID pneumonia - Ensure he completes his dexamethasone course - Ensure he resumes his chronic hydrocortisone dose after finishing dexamethasone - Consider referral to post-COVID symptom management center: [phone] - Ensure he completes his vaccination course (only had 1 dose of Pfeizer)  2. ESRD - Ensure he continues his dialysis sessions as scheduled - check electrolytes  2.  Labs / imaging needed at time of follow-up: RFP  3.  Pending labs/ test needing follow-up: N/A  Follow-up Appointments:  Follow-up Information    Sonia Side., FNP. Call.   Specialty: Family Medicine Contact information: [place] [address] [phone]        POST-COVID [place]. Call.   Contact information: [address] [place] [address] [place] Hospital Course by problem list: 1. Acute hypoxic respiratory failure 2/2 COVID pneumonia: Marvin Roberson is a 49  yo M w/ PMH of ESRD, adrenal insufficiency, MGUS, CVA, HTN, HLD, Depression presenting to [facility] w/ fevers, cough and dyspnea. Found to have acute hypoxic respiratory failure requiring bipap. He mentions having had sick contact and his COVID test came back positive. He was treated w/ 4 days of IV remdesivir and dexamethasone. Discharged w/ instructions for outpatient remdesivir infusion for 1 more day and 6 more days of dexamethasone to complete 10 day course once his oxygen requirement  resolved.  2. ESRD: On admission found to have pulmonary edema, uremia and hyperkalemia. Noted to have missed his last HD session due to viral illness. Treated w/ urgent hemodialysis w/ quick resolution of his electrolyte abnormalities and oxygen requirements. Discharged after coordination with outpatient HD location for COVID precautions.  Discharge Vitals:   BP (!) 163/98 (BP Location: Right Leg)   Pulse (!) 51   Temp 97.9 F (36.6 C) (Oral)   Resp 16   Ht 5\' 9"  (1.753 m)   Wt 57 kg   SpO2 96%   BMI 18.56 kg/m   Pertinent Labs, Studies, and Procedures:  CBC Latest Ref Rng & Units 06/16/2020 06/15/2020 06/13/2020  WBC 4.0 - 10.5 K/uL 5.9 5.2 5.9  Hemoglobin 13.0 - 17.0 g/dL 8.7(L) 8.7(L) 9.8(L)  Hematocrit 39 - 52 % 27.5(L) 27.8(L) 32.3(L)  Platelets 150 - 400 K/uL 131(L) 123(L) 124(L)   BMP Latest Ref Rng & Units 06/16/2020 06/15/2020 06/14/2020  Glucose 70 - 99 mg/dL 111(H) 113(H) 151(H)  BUN 6 - 20 mg/dL 61(H) 37(H) 51(H)  Creatinine 0.61 - 1.24 mg/dL 8.33(H) 6.69(H) 9.57(H)  Sodium 135 - 145 mmol/L 136 138 138  Potassium 3.5 - 5.1 mmol/L 4.1 3.9 4.0  Chloride 98 - 111 mmol/L 100 97(L) 99  CO2 22 - 32 mmol/L 24 27 25   Calcium 8.9 - 10.3 mg/dL 7.3(L) 7.8(L) 7.6(L)   PORTABLE CHEST 1 VIEW  FINDINGS: Right IJ hemodialysis catheter, unchanged. Left axillary vascular stent. Stable cardiomegaly. Progressive bilateral airspace consolidations most confluent within the right lung base and medial aspect of the right upper lobe. No significant pleural fluid collection. No pneumothorax.  IMPRESSION: Progressive bilateral airspace consolidations, most confluent within the right lung base and medial aspect of the right upper lobe. Findings may represent pulmonary edema versus multifocal pneumonia.  Discharge Instructions: Discharge Instructions    Call MD for:  difficulty breathing, headache or visual disturbances   Complete by: As directed    Call MD for:  persistant dizziness  or light-headedness   Complete by: As directed    Call MD for:  persistant nausea and vomiting   Complete by: As directed    Call MD for:  redness, tenderness, or signs of infection (pain, swelling, redness, odor or green/yellow discharge around incision site)   Complete by: As directed    Diet - low sodium heart healthy   Complete by: As directed    Discharge instructions   Complete by: As directed    Dear Marvin Roberson  You came to [place] with shortness of breath. We have determined this was caused by missing dialysis as well as COVID infection. Here are our recommendations for you at discharge:  Please go to [facility] infusion center by 10am tomorrow to receive your last dose of infusion Please take dexamethasone 6mg  daily for 5 additional days then resume your hydrocortisone  Thank you for choosing [facility]   Increase activity slowly   Complete by: As directed      Signed: Mosetta Anis,  MD 06/18/2020, 11:29 AM Pager: [phone] After 5pm on weekdays and 1pm on weekends: On Call Pager: [phone]

## 2020-06-16 NOTE — Progress Notes (Signed)
[name] to be D/C'd Home per MD order.  Discussed with the patient and all questions fully answered.  VSS, Skin clean, dry and intact without evidence of skin break down, no evidence of skin tears noted. IV catheter discontinued intact. Site without signs and symptoms of complications. Dressing and pressure applied.  An After Visit Summary was printed and given to the patient. Patient received prescription.  D/c education completed with patient/family including follow up instructions, medication list, d/c activities limitations if indicated, with other d/c instructions as indicated by MD - patient able to verbalize understanding, all questions fully answered.   Patient instructed to return to ED, call 911, or call MD for any changes in condition.   Patient escorted via [place], and D/C home via private auto.  Dene Gentry 06/16/2020 3:33 PM

## 2020-06-16 NOTE — Progress Notes (Signed)
Renal Quick Note:  Spoke to Marvin Roberson's outpatient HD unit [facility]) regarding pending discharge.  They are aware of his COVID + status and will plan for isolation dialysis next week -- he will be dialyzed separately in the clinic in the evening after the other patients have left.  The charge RN at the clinic will call Marvin Roberson directly to give him the information regarding what time to be there for dialysis on Monday, as well as directions for isolation protocol, including remaining in car until he is called to the building, etc.  Veneta Penton, PA-C [employer] Pager [phone]

## 2020-06-16 NOTE — Discharge Instructions (Signed)
You are scheduled for a Remdesivir infusion tomorrow at 10AM.Please park at the [place] to the left of the COVID infusion sign and call [phone] on your arrival and we will come get you.  Please do not enter hospital without our staff.

## 2020-06-16 NOTE — Progress Notes (Signed)
SATURATION QUALIFICATIONS: (This note is used to comply with regulatory documentation for home oxygen)  I was not able to complete this with the patient because he refused to walk, says he is ready to walk out of the hopital

## 2020-06-16 NOTE — Progress Notes (Signed)
   Subjective:  Marvin Roberson is a 49 y.o. with PMH of ESRD on HD MWF admit for COVID pneumonia on hospital day 3  Ms.Marvin Roberson was examined and evaluated at bedside this am while undergoing dialysis. He mentions continued to have cough but describes his sputum as more clear and only slightly red-tinged. Denies any chest pain, palpitations, lower extremity edema  Objective:  Vital signs in last 24 hours: Vitals:   06/16/20 0519 06/16/20 0615 06/16/20 0630 06/16/20 0749  BP: (!) 158/91   (!) 164/92  Pulse: (!) 51 (!) 51 (!) 54 (!) 54  Resp: 12 15 15    Temp: 97.6 F (36.4 C)   97.7 F (36.5 C)  TempSrc: Oral   Oral  SpO2: 98% 95% 96%   Weight:   57 kg   Height:       Gen: Well-developed, well nourished, NAD Neck: supple, ROM intact, dialysis catheter in place and functioning CV: RRR, S1, S2 normal Pulm: Breathing comfortably on room air Extm: ROM intact, Peripheral pulses intact Skin: Dry, Warm, normal turgor.  Neuro: AAOx3  Assessment/Plan:  Principal Problem:   Pneumonia due to COVID-19 virus Active Problems:   Adrenal insufficiency (HCC)   ESRD on hemodialysis (HCC)   Hyperkalemia, diminished renal excretion  Marvin Roberson is a 49 y.o. with PMH of ESRD on HD MWF, adrenal insufficiency, MGUS admit for acute hypoxic respiratory failure due to COVID pneumonia.  Acute hypoxic respiratory failure 2/2 COVID pneumonia Pulmonary edema 2/2 ESRD Initially presented on BIPAP had much improvement in oxygen requirement after 3 days of remdesivir, IV methylprednisolone and dialysis. Currently 99% on room air. Can finish remdesivir course at infusion center. CRP 11.6->8.5. D-dimer 5.48->7.3. Possibly slow clearance due to ESRD. Plan for discharge today. - C/w remdesivir day 4/5 & finish last dose tomorrow at infusion center - C/w dexamethasone 6mg  day 4/5 (finish 10 day course at discharge as he mentions continuing to endorse symptoms) - Ambulatory pulse ox - Proning, early ambulation,  incentive spirometry - Discharge home today +/- oxygen  ESRD on HD MWF Back on regular schedule. K 4.1 this am. Plan for dialysis today. - Appreciate nephro recs: 3rd shift at regular dialysis center - F/u electrolytes  Chronic Adrenal Insufficiency  Am bp 158/91. No significant fatigue. At 5mg  bid hydrocortisone at home. Transition back to home dose after completing dexamethasone course - C/w dexamethasone 6mg  daily  DVT prophx: subqheparin (pt has refused) Diet: Renal Bowel: N/A Code: Full  Prior to Admission Living Arrangement: Home Anticipated Discharge Location: home Barriers to Discharge: Medical treatment Dispo: Anticipated discharge in approximately today(s).   Marvin Anis, MD 06/16/2020, 8:58 AM Pager: [phone] After 5pm on weekdays and 1pm on weekends: On Call Pager: [phone]

## 2020-06-16 NOTE — Progress Notes (Signed)
[facility] Progress Note  Subjective: on RA today , 97% SpO2, finishing remdesivir today.   Vitals:   06/16/20 1000 06/16/20 1030 06/16/20 1100 06/16/20 1229  BP: (!) 164/92 (!) 158/93 (!) 166/103 (!) 163/98  Pulse: (!) 55 (!) 54 (!) 51   Resp: 16 16 16    Temp:    97.9 F (36.6 C)  TempSrc:    Oral  SpO2:      Weight:      Height:        Exam: Gen on [place] O2, in good spirits, no distress Chest soft bibasilar rales , < 1/4 up bilat RRR no MRG  Abd soft ntnd no mass or ascites +bs MS no joint effusions or deformity Ext no LE edema, no wounds or ulcers Neuro is alert, Ox 3 , nf R IJ TDC w/o signs of infection   Home meds:  - lipitor 40/ coreg 25 bid/ norvasc 10  - cortef 45m g qd  - protonix 40 qd  needing review > phoslo, hydralazine 100 tid/ singulair 10, sutab/ nvite/ zyrtec     OP HD: [facility]   3.5h  350/800  59kg  2/2.5 bath  TDC RIJ  P2  Hep 2000  - hect 1 ug  - mircera 225 q 2 last 7/26     Assessment/ Plan: 1. SOB/ resp distress - esrd pt had missed dialysis and exam/ CXR suggest superimposed pulm edema. He was also COVID+. He rec'd HD here w/ 5-6kg lowering and will need new dry wt at dc around 55-56kg. His CXR cleared up and today he is on RA and going home.  2. COVID + infection - per primary team, has completed 5d of remdesivir.  3. ESRD - on HD MWF. HD today. For outpatient HD on Monday pt will be at [place] same unit, which [place] [facility] but on 3rd shift / evening HD which is for the COVID isolation patients. The HD unit will contact him regarding HD time and other details. OK for dc from renal standpoint.  4. Adrenal insuff - cont meds, per pmd 5. HTN/vol  - cont home meds, BP's normal to high. VOl as above 6. Anemia ckd - last esa on 7/26, Hb 9.8, follow 7. MBD ckd - Ca 8.2, cont meds       Rob Jaken Fregia 06/16/2020, 3:49 PM   Recent Labs  Lab 06/13/20 1210 06/13/20 1210 06/14/20 1625 06/14/20 1625 06/15/20 0744  06/16/20 0402  K 6.1*   < > 4.0   < > 3.9 4.1  BUN 74*   < > 51*   < > 37* 61*  CREATININE 14.23*   < > 9.57*   < > 6.69* 8.33*  CALCIUM 7.7*   < > 7.6*   < > 7.8* 7.3*  PHOS 5.6*  --  3.9  --   --   --   HGB  --   --   --   --  8.7* 8.7*   < > = values in this interval not displayed.   Inpatient medications: . albuterol  2 puff Inhalation Q6H  . amLODipine  10 mg Oral Daily  . vitamin C  500 mg Oral Daily  . atorvastatin  40 mg Oral Daily  . carvedilol  25 mg Oral BID WC  . Chlorhexidine Gluconate Cloth  6 each Topical Daily  . dexamethasone  6 mg Oral Daily  . heparin  5,000 Units Subcutaneous Q8H  . heparin sodium (porcine)      .  hydrALAZINE  50 mg Oral Q8H  . hydrocortisone  5 mg Oral Daily  . pantoprazole  40 mg Oral Daily  . zinc sulfate  220 mg Oral Daily   . remdesivir 100 mg in NS 100 mL 100 mg (06/16/20 1524)   guaiFENesin-dextromethorphan, melatonin, polyethylene glycol

## 2020-06-17 ENCOUNTER — Telehealth: Payer: Self-pay | Admitting: Physician Assistant

## 2020-06-17 ENCOUNTER — Ambulatory Visit (HOSPITAL_COMMUNITY)
Admit: 2020-06-17 | Discharge: 2020-06-17 | Disposition: A | Discharge status: Home / Self Care | Payer: Medicare Other | Attending: Pulmonary Disease | Admitting: Pulmonary Disease

## 2020-06-17 DIAGNOSIS — J1282 Pneumonia due to coronavirus disease 2019: Secondary | ICD-10-CM | POA: Diagnosis not present

## 2020-06-17 DIAGNOSIS — U071 COVID-19: Secondary | ICD-10-CM | POA: Insufficient documentation

## 2020-06-17 MED ORDER — EPINEPHRINE 0.3 MG/0.3ML IJ SOAJ: 0.3000 mg | Freq: Once | INTRAMUSCULAR | Status: DC | PRN

## 2020-06-17 MED ORDER — SODIUM CHLORIDE 0.9 % IV SOLN
100.0000 mg | Freq: Once | INTRAVENOUS | Status: AC
  Administered 2020-06-17: 100 mg via INTRAVENOUS
  Filled 2020-06-17: qty 20

## 2020-06-17 MED ORDER — FAMOTIDINE IN NACL 20-0.9 MG/50ML-% IV SOLN: 20.0000 mg | Freq: Once | INTRAVENOUS | Status: DC | PRN

## 2020-06-17 MED ORDER — ALBUTEROL SULFATE HFA 108 (90 BASE) MCG/ACT IN AERS: 2.0000 | INHALATION_SPRAY | Freq: Once | RESPIRATORY_TRACT | Status: DC | PRN

## 2020-06-17 MED ORDER — SODIUM CHLORIDE 0.9 % IV SOLN: INTRAVENOUS | Status: DC | PRN

## 2020-06-17 MED ORDER — DIPHENHYDRAMINE HCL 50 MG/ML IJ SOLN: 50.0000 mg | Freq: Once | INTRAMUSCULAR | Status: DC | PRN

## 2020-06-17 MED ORDER — METHYLPREDNISOLONE SODIUM SUCC 125 MG IJ SOLR: 125.0000 mg | Freq: Once | INTRAMUSCULAR | Status: DC | PRN

## 2020-06-17 NOTE — Progress Notes (Signed)
  Diagnosis: COVID-19  Physician: Dr Joya Gaskins  Procedure: [facility] Med: remdesivir infusion - Provided patient with remdesivir fact sheet for patients, parents and caregivers prior to infusion.  Complications: No immediate complications noted.  Discharge: Discharged home   Marvin Roberson 06/17/2020

## 2020-06-17 NOTE — Telephone Encounter (Signed)
Transition of care contact from inpatient facility  Date of discharge: 06/16/20 Date of contact: 06/17/20 Method: Phone Spoke to: Patient  Patient contacted to discuss transition of care from recent inpatient hospitalization. Patient was admitted to [facility] from 06/12/20- 06/16/20 with discharge diagnosis of pulmonary edema and COVID 19 infection. Reviewed instructions for HD, dialysis unit will contact patient Monday morning. He is aware he will be dialyzed on third shift and should wait for further instructions before entering the building.  Medication changes were reviewed. Received remdesivir this morning.  Patient will follow up with his/her outpatient HD unit on:  06/19/20  Anice Paganini, PA-C 06/17/2020, 1:56 PM  [facility] Pager: [phone]

## 2020-06-17 NOTE — Discharge Instructions (Signed)
10 Things You Can Do to Manage Your COVID-19 Symptoms at Home If you have possible or confirmed COVID-19: 1. Stay home from work and school. And stay away from other public places. If you must go out, avoid using any kind of public transportation, ridesharing, or taxis. 2. Monitor your symptoms carefully. If your symptoms get worse, call your healthcare provider immediately. 3. Get rest and stay hydrated. 4. If you have a medical appointment, call the healthcare provider ahead of time and tell them that you have or may have COVID-19. 5. For medical emergencies, call 911 and notify the dispatch personnel that you have or may have COVID-19. 6. Cover your cough and sneezes with a tissue or use the inside of your elbow. 7. Wash your hands often with soap and water for at least 20 seconds or clean your hands with an alcohol-based hand sanitizer that contains at least 60% alcohol. 8. As much as possible, stay in a specific room and away from other people in your home. Also, you should use a separate bathroom, if available. If you need to be around other people in or outside of the home, wear a mask. 9. Avoid sharing personal items with other people in your household, like dishes, towels, and bedding. 10. Clean all surfaces that are touched often, like counters, tabletops, and doorknobs. Use household cleaning sprays or wipes according to the label instructions. cdc.gov/coronavirus 05/05/2019 This information is not intended to replace advice given to you by your health care provider. Make sure you discuss any questions you have with your health care provider. Document Revised: 10/07/2019 Document Reviewed: 10/07/2019 Elsevier Patient Education  2020 Elsevier Inc.  

## 2020-06-18 LAB — CULTURE, BLOOD (ROUTINE X 2)
Culture: NO GROWTH
Culture: NO GROWTH
Special Requests: ADEQUATE

## 2020-06-19 DIAGNOSIS — E875 Hyperkalemia: Secondary | ICD-10-CM | POA: Diagnosis not present

## 2020-06-19 DIAGNOSIS — N186 End stage renal disease: Secondary | ICD-10-CM | POA: Diagnosis not present

## 2020-06-19 DIAGNOSIS — N2581 Secondary hyperparathyroidism of renal origin: Secondary | ICD-10-CM | POA: Diagnosis not present

## 2020-06-19 DIAGNOSIS — U071 COVID-19: Secondary | ICD-10-CM | POA: Insufficient documentation

## 2020-06-19 DIAGNOSIS — D631 Anemia in chronic kidney disease: Secondary | ICD-10-CM | POA: Diagnosis not present

## 2020-06-19 DIAGNOSIS — Z992 Dependence on renal dialysis: Secondary | ICD-10-CM | POA: Diagnosis not present

## 2020-06-21 DIAGNOSIS — D631 Anemia in chronic kidney disease: Secondary | ICD-10-CM | POA: Diagnosis not present

## 2020-06-21 DIAGNOSIS — N186 End stage renal disease: Secondary | ICD-10-CM | POA: Diagnosis not present

## 2020-06-21 DIAGNOSIS — Z992 Dependence on renal dialysis: Secondary | ICD-10-CM | POA: Diagnosis not present

## 2020-06-21 DIAGNOSIS — E875 Hyperkalemia: Secondary | ICD-10-CM | POA: Diagnosis not present

## 2020-06-21 DIAGNOSIS — N2581 Secondary hyperparathyroidism of renal origin: Secondary | ICD-10-CM | POA: Diagnosis not present

## 2020-06-21 DIAGNOSIS — U071 COVID-19: Secondary | ICD-10-CM | POA: Diagnosis not present

## 2020-06-23 DIAGNOSIS — D631 Anemia in chronic kidney disease: Secondary | ICD-10-CM | POA: Diagnosis not present

## 2020-06-23 DIAGNOSIS — N2581 Secondary hyperparathyroidism of renal origin: Secondary | ICD-10-CM | POA: Diagnosis not present

## 2020-06-23 DIAGNOSIS — N186 End stage renal disease: Secondary | ICD-10-CM | POA: Diagnosis not present

## 2020-06-23 DIAGNOSIS — Z992 Dependence on renal dialysis: Secondary | ICD-10-CM | POA: Diagnosis not present

## 2020-06-23 DIAGNOSIS — U071 COVID-19: Secondary | ICD-10-CM | POA: Diagnosis not present

## 2020-06-23 DIAGNOSIS — E875 Hyperkalemia: Secondary | ICD-10-CM | POA: Diagnosis not present

## 2020-06-26 DIAGNOSIS — D631 Anemia in chronic kidney disease: Secondary | ICD-10-CM | POA: Diagnosis not present

## 2020-06-26 DIAGNOSIS — N2581 Secondary hyperparathyroidism of renal origin: Secondary | ICD-10-CM | POA: Diagnosis not present

## 2020-06-26 DIAGNOSIS — Z992 Dependence on renal dialysis: Secondary | ICD-10-CM | POA: Diagnosis not present

## 2020-06-26 DIAGNOSIS — U071 COVID-19: Secondary | ICD-10-CM | POA: Diagnosis not present

## 2020-06-26 DIAGNOSIS — E875 Hyperkalemia: Secondary | ICD-10-CM | POA: Diagnosis not present

## 2020-06-26 DIAGNOSIS — N186 End stage renal disease: Secondary | ICD-10-CM | POA: Diagnosis not present

## 2020-06-28 DIAGNOSIS — N186 End stage renal disease: Secondary | ICD-10-CM | POA: Diagnosis not present

## 2020-06-28 DIAGNOSIS — Z992 Dependence on renal dialysis: Secondary | ICD-10-CM | POA: Diagnosis not present

## 2020-06-28 DIAGNOSIS — E875 Hyperkalemia: Secondary | ICD-10-CM | POA: Diagnosis not present

## 2020-06-28 DIAGNOSIS — U071 COVID-19: Secondary | ICD-10-CM | POA: Diagnosis not present

## 2020-06-28 DIAGNOSIS — D631 Anemia in chronic kidney disease: Secondary | ICD-10-CM | POA: Diagnosis not present

## 2020-06-28 DIAGNOSIS — N2581 Secondary hyperparathyroidism of renal origin: Secondary | ICD-10-CM | POA: Diagnosis not present

## 2020-07-03 DIAGNOSIS — Z992 Dependence on renal dialysis: Secondary | ICD-10-CM | POA: Diagnosis not present

## 2020-07-03 DIAGNOSIS — N2581 Secondary hyperparathyroidism of renal origin: Secondary | ICD-10-CM | POA: Diagnosis not present

## 2020-07-03 DIAGNOSIS — N186 End stage renal disease: Secondary | ICD-10-CM | POA: Diagnosis not present

## 2020-07-03 DIAGNOSIS — D631 Anemia in chronic kidney disease: Secondary | ICD-10-CM | POA: Diagnosis not present

## 2020-07-03 DIAGNOSIS — E875 Hyperkalemia: Secondary | ICD-10-CM | POA: Diagnosis not present

## 2020-07-05 DIAGNOSIS — D631 Anemia in chronic kidney disease: Secondary | ICD-10-CM | POA: Diagnosis not present

## 2020-07-05 DIAGNOSIS — N186 End stage renal disease: Secondary | ICD-10-CM | POA: Diagnosis not present

## 2020-07-05 DIAGNOSIS — E875 Hyperkalemia: Secondary | ICD-10-CM | POA: Diagnosis not present

## 2020-07-05 DIAGNOSIS — D689 Coagulation defect, unspecified: Secondary | ICD-10-CM | POA: Diagnosis not present

## 2020-07-05 DIAGNOSIS — Z992 Dependence on renal dialysis: Secondary | ICD-10-CM | POA: Diagnosis not present

## 2020-07-05 DIAGNOSIS — I129 Hypertensive chronic kidney disease with stage 1 through stage 4 chronic kidney disease, or unspecified chronic kidney disease: Secondary | ICD-10-CM | POA: Diagnosis not present

## 2020-07-05 DIAGNOSIS — N2581 Secondary hyperparathyroidism of renal origin: Secondary | ICD-10-CM | POA: Diagnosis not present

## 2020-07-07 DIAGNOSIS — Z992 Dependence on renal dialysis: Secondary | ICD-10-CM | POA: Diagnosis not present

## 2020-07-07 DIAGNOSIS — D631 Anemia in chronic kidney disease: Secondary | ICD-10-CM | POA: Diagnosis not present

## 2020-07-07 DIAGNOSIS — N2581 Secondary hyperparathyroidism of renal origin: Secondary | ICD-10-CM | POA: Diagnosis not present

## 2020-07-07 DIAGNOSIS — E875 Hyperkalemia: Secondary | ICD-10-CM | POA: Diagnosis not present

## 2020-07-07 DIAGNOSIS — D689 Coagulation defect, unspecified: Secondary | ICD-10-CM | POA: Diagnosis not present

## 2020-07-07 DIAGNOSIS — N186 End stage renal disease: Secondary | ICD-10-CM | POA: Diagnosis not present

## 2020-07-10 DIAGNOSIS — N186 End stage renal disease: Secondary | ICD-10-CM | POA: Diagnosis not present

## 2020-07-10 DIAGNOSIS — E875 Hyperkalemia: Secondary | ICD-10-CM | POA: Diagnosis not present

## 2020-07-10 DIAGNOSIS — Z992 Dependence on renal dialysis: Secondary | ICD-10-CM | POA: Diagnosis not present

## 2020-07-10 DIAGNOSIS — N2581 Secondary hyperparathyroidism of renal origin: Secondary | ICD-10-CM | POA: Diagnosis not present

## 2020-07-10 DIAGNOSIS — D689 Coagulation defect, unspecified: Secondary | ICD-10-CM | POA: Diagnosis not present

## 2020-07-10 DIAGNOSIS — D631 Anemia in chronic kidney disease: Secondary | ICD-10-CM | POA: Diagnosis not present

## 2020-07-12 DIAGNOSIS — E875 Hyperkalemia: Secondary | ICD-10-CM | POA: Diagnosis not present

## 2020-07-12 DIAGNOSIS — D631 Anemia in chronic kidney disease: Secondary | ICD-10-CM | POA: Diagnosis not present

## 2020-07-12 DIAGNOSIS — N186 End stage renal disease: Secondary | ICD-10-CM | POA: Diagnosis not present

## 2020-07-12 DIAGNOSIS — D689 Coagulation defect, unspecified: Secondary | ICD-10-CM | POA: Diagnosis not present

## 2020-07-12 DIAGNOSIS — N2581 Secondary hyperparathyroidism of renal origin: Secondary | ICD-10-CM | POA: Diagnosis not present

## 2020-07-12 DIAGNOSIS — Z992 Dependence on renal dialysis: Secondary | ICD-10-CM | POA: Diagnosis not present

## 2020-07-14 DIAGNOSIS — E875 Hyperkalemia: Secondary | ICD-10-CM | POA: Diagnosis not present

## 2020-07-14 DIAGNOSIS — N2581 Secondary hyperparathyroidism of renal origin: Secondary | ICD-10-CM | POA: Diagnosis not present

## 2020-07-14 DIAGNOSIS — Z992 Dependence on renal dialysis: Secondary | ICD-10-CM | POA: Diagnosis not present

## 2020-07-14 DIAGNOSIS — D689 Coagulation defect, unspecified: Secondary | ICD-10-CM | POA: Diagnosis not present

## 2020-07-14 DIAGNOSIS — N186 End stage renal disease: Secondary | ICD-10-CM | POA: Diagnosis not present

## 2020-07-14 DIAGNOSIS — D631 Anemia in chronic kidney disease: Secondary | ICD-10-CM | POA: Diagnosis not present

## 2020-07-17 DIAGNOSIS — T782XXA Anaphylactic shock, unspecified, initial encounter: Secondary | ICD-10-CM | POA: Insufficient documentation

## 2020-07-17 DIAGNOSIS — N186 End stage renal disease: Secondary | ICD-10-CM | POA: Diagnosis not present

## 2020-07-17 DIAGNOSIS — N2581 Secondary hyperparathyroidism of renal origin: Secondary | ICD-10-CM | POA: Diagnosis not present

## 2020-07-17 DIAGNOSIS — D689 Coagulation defect, unspecified: Secondary | ICD-10-CM | POA: Diagnosis not present

## 2020-07-17 DIAGNOSIS — D631 Anemia in chronic kidney disease: Secondary | ICD-10-CM | POA: Diagnosis not present

## 2020-07-17 DIAGNOSIS — Z992 Dependence on renal dialysis: Secondary | ICD-10-CM | POA: Diagnosis not present

## 2020-07-17 DIAGNOSIS — E875 Hyperkalemia: Secondary | ICD-10-CM | POA: Diagnosis not present

## 2020-07-17 DIAGNOSIS — T7840XA Allergy, unspecified, initial encounter: Secondary | ICD-10-CM | POA: Insufficient documentation

## 2020-07-19 DIAGNOSIS — D631 Anemia in chronic kidney disease: Secondary | ICD-10-CM | POA: Diagnosis not present

## 2020-07-19 DIAGNOSIS — E875 Hyperkalemia: Secondary | ICD-10-CM | POA: Diagnosis not present

## 2020-07-19 DIAGNOSIS — N2581 Secondary hyperparathyroidism of renal origin: Secondary | ICD-10-CM | POA: Diagnosis not present

## 2020-07-19 DIAGNOSIS — Z992 Dependence on renal dialysis: Secondary | ICD-10-CM | POA: Diagnosis not present

## 2020-07-19 DIAGNOSIS — D689 Coagulation defect, unspecified: Secondary | ICD-10-CM | POA: Diagnosis not present

## 2020-07-19 DIAGNOSIS — N186 End stage renal disease: Secondary | ICD-10-CM | POA: Diagnosis not present

## 2020-07-20 ENCOUNTER — Encounter: Payer: Medicare Other | Admitting: Gastroenterology

## 2020-07-20 DIAGNOSIS — Z23 Encounter for immunization: Secondary | ICD-10-CM | POA: Diagnosis not present

## 2020-07-21 DIAGNOSIS — N2581 Secondary hyperparathyroidism of renal origin: Secondary | ICD-10-CM | POA: Diagnosis not present

## 2020-07-21 DIAGNOSIS — D689 Coagulation defect, unspecified: Secondary | ICD-10-CM | POA: Diagnosis not present

## 2020-07-21 DIAGNOSIS — N186 End stage renal disease: Secondary | ICD-10-CM | POA: Diagnosis not present

## 2020-07-21 DIAGNOSIS — E875 Hyperkalemia: Secondary | ICD-10-CM | POA: Diagnosis not present

## 2020-07-21 DIAGNOSIS — D631 Anemia in chronic kidney disease: Secondary | ICD-10-CM | POA: Diagnosis not present

## 2020-07-21 DIAGNOSIS — Z992 Dependence on renal dialysis: Secondary | ICD-10-CM | POA: Diagnosis not present

## 2020-07-24 DIAGNOSIS — N2581 Secondary hyperparathyroidism of renal origin: Secondary | ICD-10-CM | POA: Diagnosis not present

## 2020-07-24 DIAGNOSIS — Z992 Dependence on renal dialysis: Secondary | ICD-10-CM | POA: Diagnosis not present

## 2020-07-24 DIAGNOSIS — D689 Coagulation defect, unspecified: Secondary | ICD-10-CM | POA: Diagnosis not present

## 2020-07-24 DIAGNOSIS — E875 Hyperkalemia: Secondary | ICD-10-CM | POA: Diagnosis not present

## 2020-07-24 DIAGNOSIS — D631 Anemia in chronic kidney disease: Secondary | ICD-10-CM | POA: Diagnosis not present

## 2020-07-24 DIAGNOSIS — N186 End stage renal disease: Secondary | ICD-10-CM | POA: Diagnosis not present

## 2020-07-26 DIAGNOSIS — Z992 Dependence on renal dialysis: Secondary | ICD-10-CM | POA: Diagnosis not present

## 2020-07-26 DIAGNOSIS — N2581 Secondary hyperparathyroidism of renal origin: Secondary | ICD-10-CM | POA: Diagnosis not present

## 2020-07-26 DIAGNOSIS — N186 End stage renal disease: Secondary | ICD-10-CM | POA: Diagnosis not present

## 2020-07-26 DIAGNOSIS — D631 Anemia in chronic kidney disease: Secondary | ICD-10-CM | POA: Diagnosis not present

## 2020-07-26 DIAGNOSIS — E875 Hyperkalemia: Secondary | ICD-10-CM | POA: Diagnosis not present

## 2020-07-26 DIAGNOSIS — D689 Coagulation defect, unspecified: Secondary | ICD-10-CM | POA: Diagnosis not present

## 2020-07-28 DIAGNOSIS — D631 Anemia in chronic kidney disease: Secondary | ICD-10-CM | POA: Diagnosis not present

## 2020-07-28 DIAGNOSIS — D689 Coagulation defect, unspecified: Secondary | ICD-10-CM | POA: Diagnosis not present

## 2020-07-28 DIAGNOSIS — N2581 Secondary hyperparathyroidism of renal origin: Secondary | ICD-10-CM | POA: Diagnosis not present

## 2020-07-28 DIAGNOSIS — N186 End stage renal disease: Secondary | ICD-10-CM | POA: Diagnosis not present

## 2020-07-28 DIAGNOSIS — E875 Hyperkalemia: Secondary | ICD-10-CM | POA: Diagnosis not present

## 2020-07-28 DIAGNOSIS — Z992 Dependence on renal dialysis: Secondary | ICD-10-CM | POA: Diagnosis not present

## 2020-07-31 DIAGNOSIS — E875 Hyperkalemia: Secondary | ICD-10-CM | POA: Diagnosis not present

## 2020-07-31 DIAGNOSIS — D631 Anemia in chronic kidney disease: Secondary | ICD-10-CM | POA: Diagnosis not present

## 2020-07-31 DIAGNOSIS — Z992 Dependence on renal dialysis: Secondary | ICD-10-CM | POA: Diagnosis not present

## 2020-07-31 DIAGNOSIS — N2581 Secondary hyperparathyroidism of renal origin: Secondary | ICD-10-CM | POA: Diagnosis not present

## 2020-07-31 DIAGNOSIS — D689 Coagulation defect, unspecified: Secondary | ICD-10-CM | POA: Diagnosis not present

## 2020-07-31 DIAGNOSIS — N186 End stage renal disease: Secondary | ICD-10-CM | POA: Diagnosis not present

## 2020-08-02 DIAGNOSIS — E875 Hyperkalemia: Secondary | ICD-10-CM | POA: Diagnosis not present

## 2020-08-02 DIAGNOSIS — N2581 Secondary hyperparathyroidism of renal origin: Secondary | ICD-10-CM | POA: Diagnosis not present

## 2020-08-02 DIAGNOSIS — D689 Coagulation defect, unspecified: Secondary | ICD-10-CM | POA: Diagnosis not present

## 2020-08-02 DIAGNOSIS — Z992 Dependence on renal dialysis: Secondary | ICD-10-CM | POA: Diagnosis not present

## 2020-08-02 DIAGNOSIS — D631 Anemia in chronic kidney disease: Secondary | ICD-10-CM | POA: Diagnosis not present

## 2020-08-02 DIAGNOSIS — N186 End stage renal disease: Secondary | ICD-10-CM | POA: Diagnosis not present

## 2020-08-03 ENCOUNTER — Ambulatory Visit: Payer: Self-pay | Admitting: General Surgery

## 2020-08-03 DIAGNOSIS — K4090 Unilateral inguinal hernia, without obstruction or gangrene, not specified as recurrent: Secondary | ICD-10-CM | POA: Diagnosis not present

## 2020-08-03 NOTE — H&P (Signed)
History of Present Illness Ralene Ok MD; 08/03/2020 4:13 PM) The patient is a 49 year old male who presents with an inguinal hernia. Chief Complaint: Left inguinal hernia  Patient is a 49 year old male is referred by Dr. Madelon Lips, he has a history for end-stage renal disease on dialysis Monday Wednesday Friday, with a left inguinal hernia. Patient states this is been there for the last several months. He states that the bulge is evident when he stands up. He states able to reduce when he lays down. He states he has no pain to the area.  Patient states he's had an incision for a tumor, cyst cyst removal in that area. He states this was several years ago. He is unsure of the procedure and or pathology.  Patient undergone previous right inguinal incision as well as likely appendectomy the patient is unsure.      Past Surgical History [facility] Teressa Senter, Normal; 08/03/2020 4:02 PM) Dialysis Shunt / Fistula  Thyroid Surgery   Allergies (Chanel Teressa Senter, CMA; 08/03/2020 4:04 PM) Lisinopril *ANTIHYPERTENSIVES*  Codeine Phosphate *ANALGESICS - OPIOID*  Gabapentin *ANTICONVULSANTS*  Povidone-Iodine *ANTISEPTICS & DISINFECTANTS*  Betadine *ANTISEPTICS & DISINFECTANTS*  Allergies Reconciled   Medication History (Chanel Teressa Senter, CMA; 08/03/2020 4:04 PM) amLODIPine Besylate (10MG  Tablet, Oral) Active. Coreg (25MG  Tablet, Oral) Active. ZyrTEC Allergy (10MG  Tablet, Oral) Active. hydrALAZINE HCl (100MG  Tablet, Oral) Active. Singulair (10MG  Tablet, Oral) Active. Medications Reconciled  Other Problems (Chanel Teressa Senter, CMA; 08/03/2020 4:02 PM) Anxiety Disorder  Gastric Ulcer  Gastroesophageal Reflux Disease  High blood pressure     Review of Systems Ralene Ok MD; 08/03/2020 4:12 PM) Skin Not Present- Change in Wart/Mole, Dryness, Hives, Jaundice, New Lesions, Non-Healing Wounds, Rash and Ulcer. HEENT Present- Wears glasses/contact lenses. Not Present- Earache,  Hearing Loss, Hoarseness, Nose Bleed, Oral Ulcers, Ringing in the Ears, Seasonal Allergies, Sinus Pain, Sore Throat, Visual Disturbances and Yellow Eyes. Cardiovascular Not Present- Chest Pain, Difficulty Breathing Lying Down, Leg Cramps, Palpitations, Rapid Heart Rate, Shortness of Breath and Swelling of Extremities. Gastrointestinal Present- Bloating and Constipation. Not Present- Abdominal Pain, Bloody Stool, Change in Bowel Habits, Chronic diarrhea, Difficulty Swallowing, Excessive gas, Gets full quickly at meals, Hemorrhoids, Indigestion, Nausea, Rectal Pain and Vomiting. Male Genitourinary Not Present- Blood in Urine, Change in Urinary Stream, Frequency, Impotence, Nocturia, Painful Urination, Urgency and Urine Leakage. Neurological Present- Headaches. Not Present- Decreased Memory, Fainting, Numbness, Seizures, Tingling, Tremor, Trouble walking and Weakness. Psychiatric Present- Anxiety, Change in Sleep Pattern and Depression. Not Present- Bipolar, Fearful and Frequent crying. Endocrine Present- Cold Intolerance. Not Present- Excessive Hunger, Hair Changes, Heat Intolerance, Hot flashes and New Diabetes. Hematology Not Present- Blood Thinners, Easy Bruising, Excessive bleeding, Gland problems, HIV and Persistent Infections. All other systems negative  Vitals (Chanel Nolan CMA; 08/03/2020 4:05 PM) 08/03/2020 4:05 PM Weight: 128.25 lb Height: 68in Body Surface Area: 1.69 m Body Mass Index: 19.5 kg/m  Temp.: 97.38F  Pulse: 79 (Regular)        Physical Exam Ralene Ok MD; 08/03/2020 4:14 PM) The physical exam findings are as follows: Note: Constitutional: No acute distress, conversant, appears stated age  Eyes: Anicteric sclerae, moist conjunctiva, no lid lag  Neck: No thyromegaly, trachea midline, no cervical lymphadenopathy  Lungs: Clear to auscultation biilaterally, normal respiratory effot  Cardiovascular: regular rate & rhythm, no murmurs, no peripheal edema,  pedal pulses 2+  GI: Soft, no masses or hepatosplenomegaly, non-tender to palpation  MSK: Normal gait, no clubbing cyanosis, edema  Skin: No rashes, palpation reveals normal skin turgor  Psychiatric: Appropriate judgment and insight, oriented to person, place, and time  Abdomen Inspection Hernias - Inguinal hernia - Left - Reducible - Left (, Likely left incisional versus direct) .    Assessment & Plan Ralene Ok MD; 08/03/2020 4:14 PM) LEFT INGUINAL HERNIA (K40.90) Impression: 49 year old male with a history of end-stage renal disease on dialysis, with a left inguinal hernia  1. The patient will like to proceed to the operating room for laparoscopic left inguinal hernia repair with mesh.  2. I discussed with the patient the signs and symptoms of incarceration and strangulation and the need to proceed to the ER should they occur.  3. I discussed with the patient the risks and benefits of the procedure to include but not limited to: Infection, bleeding, damage to surrounding structures, possible need for further surgery, possible nerve pain, and possible recurrence. The patient was understanding and wishes to proceed.

## 2020-08-04 DIAGNOSIS — N186 End stage renal disease: Secondary | ICD-10-CM | POA: Diagnosis not present

## 2020-08-04 DIAGNOSIS — N2581 Secondary hyperparathyroidism of renal origin: Secondary | ICD-10-CM | POA: Diagnosis not present

## 2020-08-04 DIAGNOSIS — Z992 Dependence on renal dialysis: Secondary | ICD-10-CM | POA: Diagnosis not present

## 2020-08-04 DIAGNOSIS — E875 Hyperkalemia: Secondary | ICD-10-CM | POA: Diagnosis not present

## 2020-08-04 DIAGNOSIS — D631 Anemia in chronic kidney disease: Secondary | ICD-10-CM | POA: Diagnosis not present

## 2020-08-04 DIAGNOSIS — D509 Iron deficiency anemia, unspecified: Secondary | ICD-10-CM | POA: Diagnosis not present

## 2020-08-04 DIAGNOSIS — I129 Hypertensive chronic kidney disease with stage 1 through stage 4 chronic kidney disease, or unspecified chronic kidney disease: Secondary | ICD-10-CM | POA: Diagnosis not present

## 2020-08-05 ENCOUNTER — Emergency Department (HOSPITAL_COMMUNITY)
Admission: EM | Admit: 2020-08-05 | Discharge: 2020-08-05 | Disposition: A | Discharge status: Home / Self Care | Payer: Medicare Other | Attending: Emergency Medicine | Admitting: Emergency Medicine

## 2020-08-05 ENCOUNTER — Encounter (HOSPITAL_COMMUNITY): Payer: Self-pay

## 2020-08-05 ENCOUNTER — Other Ambulatory Visit: Payer: Self-pay

## 2020-08-05 DIAGNOSIS — R55 Syncope and collapse: Secondary | ICD-10-CM

## 2020-08-05 DIAGNOSIS — I2510 Atherosclerotic heart disease of native coronary artery without angina pectoris: Secondary | ICD-10-CM | POA: Insufficient documentation

## 2020-08-05 DIAGNOSIS — Z992 Dependence on renal dialysis: Secondary | ICD-10-CM | POA: Insufficient documentation

## 2020-08-05 DIAGNOSIS — I10 Essential (primary) hypertension: Secondary | ICD-10-CM

## 2020-08-05 DIAGNOSIS — R42 Dizziness and giddiness: Secondary | ICD-10-CM | POA: Insufficient documentation

## 2020-08-05 DIAGNOSIS — N186 End stage renal disease: Secondary | ICD-10-CM | POA: Diagnosis not present

## 2020-08-05 DIAGNOSIS — I120 Hypertensive chronic kidney disease with stage 5 chronic kidney disease or end stage renal disease: Secondary | ICD-10-CM | POA: Diagnosis not present

## 2020-08-05 LAB — BASIC METABOLIC PANEL
Anion gap: 11 (ref 5–15)
BUN: 27 mg/dL — ABNORMAL HIGH (ref 6–20)
CO2: 28 mmol/L (ref 22–32)
Calcium: 8.6 mg/dL — ABNORMAL LOW (ref 8.9–10.3)
Chloride: 98 mmol/L (ref 98–111)
Creatinine, Ser: 8.06 mg/dL — ABNORMAL HIGH (ref 0.61–1.24)
GFR calc Af Amer: 8 mL/min — ABNORMAL LOW (ref >60)
GFR calc non Af Amer: 7 mL/min — ABNORMAL LOW (ref >60)
Glucose, Bld: 78 mg/dL (ref 70–99)
Potassium: 4.1 mmol/L (ref 3.5–5.1)
Sodium: 137 mmol/L (ref 135–145)

## 2020-08-05 LAB — CBC
HCT: 35.1 % — ABNORMAL LOW (ref 39.0–52.0)
Hemoglobin: 10.6 g/dL — ABNORMAL LOW (ref 13.0–17.0)
MCH: 27 pg (ref 26.0–34.0)
MCHC: 30.2 g/dL (ref 30.0–36.0)
MCV: 89.5 fL (ref 80.0–100.0)
Platelets: 131 10*3/uL — ABNORMAL LOW (ref 150–400)
RBC: 3.92 MIL/uL — ABNORMAL LOW (ref 4.22–5.81)
RDW: 21.9 % — ABNORMAL HIGH (ref 11.5–15.5)
WBC: 3.6 10*3/uL — ABNORMAL LOW (ref 4.0–10.5)
nRBC: 0 % (ref 0.0–0.2)

## 2020-08-05 NOTE — ED Notes (Signed)
Pt tolerated Ortho VS well. Pt reports some lightheadedness while transitioning to sitting up and while standing.   Findings reported to EDP

## 2020-08-05 NOTE — ED Triage Notes (Signed)
Patient reports near syncopal episode on Thursday with 2 episodes of vomiting. Yesterday had full dialysis. Reports now only has intermittent dizziness. Denies illness, denies pain, denies trauma

## 2020-08-05 NOTE — Discharge Instructions (Addendum)
It was our pleasure to provide your ER care today - we hope that you feel better.  Follow up with your doctor in the coming week. Also have your blood pressure rechecked then as it is high today.   Also follow up with cardiologist in the coming week - discuss possible outpatient monitoring.   Return to ER if worse, new symptoms, fainting episodes, chest pain, trouble breathing, problems walking, numbness or weakness, change in speech or vision, or other concern.

## 2020-08-05 NOTE — ED Provider Notes (Addendum)
[facility] EMERGENCY DEPARTMENT Provider Note   CSN: [id number] Arrival date & time: 08/05/20  8938     History Chief Complaint  Patient presents with  . Dizziness    Marvin Roberson is a 49 y.o. male.  Patient c/o episode feeling lightheaded/dizzy 2 days ago. Was driving at time, felt nauseated, lightheaded, had to pull over and then pull into gas station for a few minutes. Episode lasted 10-20 minutes. Was associated with nv, and had 1-2 episodes of non bloody, non bilious emesis then.  No associated chest pain or discomfort. No sob or unusual doe. No palpitations or sense of fast or irregular heartbeat. No loc or syncope. Pt has ESRD on dialysis for many years - denies any change to dialysis this week, goes M/W/F. Denies any change in meds or new meds. No recent blood loss, rectal bleeding or melena. No fever or chills. No wt loss. No abd pain. No diarrhea. No dysuria. Pt denies change in speech or vision. No new numbness/weakness. No change in balance or coordination.   The history is provided by the patient.       Past Medical History:  Diagnosis Date  . Adrenal insufficiency ([place])   . Allergy   . Anxiety   . Depression   . Dyspnea    "when I have too much fluid"  . ESRD on dialysis [facility]) since 1990s   "M,W,F; [address]" (11/27/2016)  . GERD (gastroesophageal reflux disease)   . High cholesterol   . History of blood transfusion   . Hypertension   . Kidney failure   . Renal insufficiency   . Stroke [facility]) 2016   decreased vision in his left eye/notes 11/27/2016    Patient Active Problem List   Diagnosis Date Noted  . Pneumonia due to COVID-19 virus 06/13/2020  . Hepatic cirrhosis ([place]) 03/09/2020  . Encounter for other preprocedural examination 03/09/2020  . Chronic nonintractable headache 01/28/2019  . Thrombocytopenia ([place]) 08/13/2018  . ESRD on dialysis ([place])   . ESRD (end stage renal disease) ([place]) 01/14/2018  . Hyperkalemia, diminished  renal excretion 10/20/2017  . Post-operative complication 08/20/5101  . 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 ESRD on hemodialysis ([place])   . 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 Adrenal insufficiency ([place])   . Hypoglycemia due to insulin 11/27/2016  . Hypertension 11/27/2016  . History of CVA (cerebrovascular accident) 11/27/2016  . Monoclonal gammopathy of unknown significance (MGUS) 11/27/2016  . Bradycardia 02/26/2016  . Hypertensive cardiomyopathy ([place]) 01/22/2016  . Hypertensive kidney disease with ESRD (end-stage renal disease) ([place]) 01/22/2016  . Staphylococcus aureus bacteremia 01/22/2016  . Hydrocele 12/01/2015  . CAD (coronary artery disease) 07/24/2015  . 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 Anticoagulation goal of INR 2 to 3 07/05/2014  . Avascular necrosis of femoral head ([place]) 07/05/2014  . Intestinal occlusion ([place]) 07/05/2014  . Weakness of both hips 07/05/2014  . Anemia, chronic disease 03/18/2014  . Hyponatremia 03/18/2014  . Retroperitoneal lymphadenopathy 03/18/2014  . Weight loss 03/03/2014  . Sepsis ([place]) 03/30/2013  . Migraine headache 10/11/2012  . Pulmonary embolism ([place]) 10/06/2012  . DVT of upper extremity (deep vein thrombosis) ([place]) 10/05/2012  . Cellulitis of left arm 10/04/2012    Past Surgical History:  Procedure Laterality Date  . ANGIOPLASTY Left 12/11/2016   Procedure: ANGIOPLASTY LEFT ARM & SUBCLAVIAN ARTERY;  Surgeon: Conrad Lula, MD;  Location: [facility];  Service: Vascular;  Laterality: Left;  .  arm surgery Left 2016   "for aneurysm"  . AV FISTULA PLACEMENT    . FALSE ANEURYSM REPAIR Left 12/11/2016   Procedure: RESECTION  BRACHIAL ARTERY;  Surgeon: Conrad [place], MD;  Location: [place];  Service: Vascular;  Laterality: Left;  . FALSE ANEURYSM REPAIR Left 01/14/2018   Procedure: REPAIR FALSE ANEURYSM LEFT BRACHIAL ARTERY WITH SAPHENOUS VEIN  LEFT ARTERIOVENOUS GRAFT;  Surgeon: Rosetta Posner, MD;  Location: [facility];   Service: Vascular;  Laterality: Left;  . INSERTION OF DIALYSIS CATHETER Right    chest  . IR FLUORO GUIDE CV LINE RIGHT  11/17/2019  . THROMBECTOMY BRACHIAL ARTERY  12/11/2016   Procedure: THROMBECTOMY BRACHIAL ARTERY AND ULNAR;  Surgeon: Conrad [place], MD;  Location: [place] [facility];  Service: Vascular;;  . THYROIDECTOMY, PARTIAL    . UPPER EXTREMITY ANGIOGRAM Left 12/11/2016   Procedure: LEFT ARM ANGIOGRAM;  Surgeon: Conrad [place], MD;  Location: [facility];  Service: Vascular;  Laterality: Left;  Marland Kitchen VEIN HARVEST Left 01/14/2018   Procedure: VEIN HARVEST LEFT SAPHENOUS VEIN;  Surgeon: Rosetta Posner, MD;  Location: [facility];  Service: Vascular;  Laterality: Left;  . WOUND EXPLORATION Left 12/11/2016   Procedure: LEFT ARM BRACHIAL ARTERY WITH INTERPOSTIONAL GRAFT;  Surgeon: Conrad [place], MD;  [facility];  Service: Vascular;  Laterality: Left;       Family History  Problem Relation Age of Onset  . Hypertension Other   . Cancer Brother        in leg and lungs  . Adrenal disorder Neg Hx   . Colon cancer Neg Hx   . Rectal cancer Neg Hx   . Stomach cancer Neg Hx     Social History   Tobacco Use  . Smoking status: Current Every Day Smoker    Packs/day: 0.50    Years: 20.00    Pack years: 10.00    Types: Cigarettes  . Smokeless tobacco: Never Used  . Tobacco comment: 1 pk every 2 days.  Vaping Use  . Vaping Use: Never used  Substance Use Topics  . Alcohol use: Not Currently  . Drug use: No    Home Medications Prior to Admission medications   Medication Sig Start Date End Date Taking? Authorizing Provider  acetaminophen (TYLENOL) 500 MG tablet Take 500 mg by mouth every 6 (six) hours as needed for mild pain.  01/03/20 01/01/21  [provider]  amLODipine (NORVASC) 10 MG tablet Take 10 mg by mouth at bedtime.  01/07/19   [provider]  atorvastatin (LIPITOR) 40 MG tablet Take 40 mg by mouth daily.  01/27/16   [provider]  calcium acetate (PHOSLO) 667 MG capsule Take  2,001 mg by mouth 3 (three) times daily with meals.  01/08/19   [provider]  carvedilol (COREG) 25 MG tablet Take 25 mg by mouth 2 (two) times daily with a meal.    [provider]  cetirizine (ZYRTEC) 10 MG tablet Take 10 mg by mouth daily as needed for allergies.  01/10/20   [provider]  folic acid-vitamin b complex-vitamin c-selenium-zinc (DIALYVITE) 3 MG TABS tablet Take 1 tablet by mouth daily.    [provider]  guaiFENesin-dextromethorphan (ROBITUSSIN DM) 100-10 MG/5ML syrup Take 10 mLs by mouth every 4 (four) hours as needed for cough. 06/16/20   Mosetta Anis, MD  hydrALAZINE (APRESOLINE) 100 MG tablet Take 100 mg by mouth 3 (three) times daily.    [provider]  hydrocortisone (CORTEF) 5 MG tablet Take 1 tablet (5 mg total) by mouth 2 (two) times daily. Start after finishing your course of dexamethasone 06/16/20   Mosetta Anis, MD  montelukast (SINGULAIR) 10 MG tablet Take 10 mg by mouth at bedtime.    [provider]  pantoprazole (PROTONIX) 40 MG tablet Take 40 mg by mouth daily. 11/22/18   [provider]    Allergies    Doxycycline, Codeine, Gabapentin, Lisinopril, Povidone-iodine, and Betadine [povidone iodine]  Review of Systems   Review of Systems  Constitutional: Negative for chills and fever.  HENT: Negative for sore throat.   Eyes: Negative for redness and visual disturbance.  Respiratory: Negative for cough and shortness of breath.   Cardiovascular: Negative for chest pain, palpitations and leg swelling.  Gastrointestinal: Positive for nausea and vomiting. Negative for abdominal pain and diarrhea.  Endocrine: Negative for polyuria.  Genitourinary: Negative for dysuria and flank pain.  Musculoskeletal: Negative for back pain and neck pain.  Skin: Negative for rash.  Neurological: Positive for light-headedness. Negative for speech difficulty, weakness, numbness and headaches.  Hematological: Does not  bruise/bleed easily.  Psychiatric/Behavioral: Negative for confusion.    Physical Exam Updated Vital Signs BP (!) 180/95   Pulse 67   Temp 98.1 F (36.7 C) (Oral)   Resp 16   Ht 1.727 m (5\' 8" )   Wt 59 kg   SpO2 100%   BMI 19.77 kg/m   Physical Exam Vitals and nursing note reviewed.  Constitutional:      Appearance: Normal appearance. He is well-developed.  HENT:     Head: Atraumatic.     Nose: Nose normal.     Mouth/Throat:     Mouth: Mucous membranes are moist.     Pharynx: Oropharynx is clear.  Eyes:     General: No scleral icterus.    Conjunctiva/sclera: Conjunctivae normal.     Pupils: Pupils are equal, round, and reactive to light.  Neck:     Vascular: No carotid bruit.     Trachea: No tracheal deviation.  Cardiovascular:     Rate and Rhythm: Normal rate and regular rhythm.     Pulses: Normal pulses.     Heart sounds: Normal heart sounds. No murmur heard.  No friction rub. No gallop.   Pulmonary:     Effort: Pulmonary effort is normal. No accessory muscle usage or respiratory distress.     Breath sounds: Normal breath sounds.     Comments: dialysis cath right chest without sign of infection.  Abdominal:     General: Bowel sounds are normal. There is no distension.     Palpations: Abdomen is soft.     Tenderness: There is no abdominal tenderness. There is no guarding.  Genitourinary:    Comments: No cva tenderness. Musculoskeletal:        General: No swelling or tenderness.     Cervical back: Normal range of motion and neck supple. No rigidity.     Right lower leg: No edema.     Left lower leg: No edema.  Skin:    General: Skin is warm and dry.     Findings: No rash.  Neurological:     Mental Status: He is alert.     Comments: Alert, speech clear. Motor/sens grossly intact bil. Steady gait.   Psychiatric:        Mood and Affect: Mood normal.     ED Results / Procedures / Treatments   Labs (all labs ordered  are listed, but only abnormal results  are displayed) Results for orders placed or performed during the hospital encounter of 48/54/62  Basic metabolic panel  Result Value Ref Range   Sodium 137 135 - 145 mmol/L   Potassium 4.1 3.5 - 5.1 mmol/L   Chloride 98 98 - 111 mmol/L   CO2 28 22 - 32 mmol/L   Glucose, Bld 78 70 - 99 mg/dL   BUN 27 (H) 6 - 20 mg/dL   Creatinine, Ser 8.06 (H) 0.61 - 1.24 mg/dL   Calcium 8.6 (L) 8.9 - 10.3 mg/dL   GFR calc non Af Amer 7 (L) >60 mL/min   GFR calc Af Amer 8 (L) >60 mL/min   Anion gap 11 5 - 15  CBC  Result Value Ref Range   WBC 3.6 (L) 4.0 - 10.5 K/uL   RBC 3.92 (L) 4.22 - 5.81 MIL/uL   Hemoglobin 10.6 (L) 13.0 - 17.0 g/dL   HCT 35.1 (L) 39 - 52 %   MCV 89.5 80.0 - 100.0 fL   MCH 27.0 26.0 - 34.0 pg   MCHC 30.2 30.0 - 36.0 g/dL   RDW 21.9 (H) 11.5 - 15.5 %   Platelets 131 (L) 150 - 400 K/uL   nRBC 0.0 0.0 - 0.2 %    EKG EKG Interpretation  Date/Time:  Saturday August 05 2020 08:48:32 EDT Ventricular Rate:  71 PR Interval:  270 QRS Duration: 100 QT Interval:  438 QTC Calculation: 475 R Axis:   -63 Text Interpretation: Sinus rhythm with 1st degree A-V block Left anterior fascicular block Prolonged QT Non-specific ST-t changes `ecg similar to ecg 08/2018 Confirmed by Lajean Saver [phone]) on 08/05/2020 11:24:00 AM   Radiology No results found.  Procedures Procedures (including critical care time)  Medications Ordered in ED Medications - No data to display  ED Course  I have reviewed the triage vital signs and the nursing notes.  Pertinent labs & imaging results that were available during my care of the patient were reviewed by me and considered in my medical decision making (see chart for details).    MDM Rules/Calculators/A&P                          Iv ns. Continuous pulse ox and cardiac monitoring.  Stat labs.   Reviewed nursing notes and prior charts for additional history.   Labs reviewed/interpreted by me - hgb 10, sl improved from prior. k normal.    Po fluids, food. Orthostatic vitals (note that standing orthostatic not accurate/valid, and repeat bp in range 15-160/90s).   Recheck pt, no faintness or dizziness in ED, ambulates w steady gait.  In sinus rhythm. Pt denies fever/chills/sweats. Tolerating po, no nvd. No dysuria, odor to urine, or change in amount of urine (despite HD, patient indicates he makes good amount of urine at baseline). No palpitations. No chest pain or discomfort. No sob.   Pt currently asymptomatic and appears stable for d/c.   Rec pcp f/u.  Return precautions provided.    Final Clinical Impression(s) / ED Diagnoses Final diagnoses:  None    Rx / DC Orders ED Discharge Orders    None        Lajean Saver, MD 08/05/20 1423

## 2020-08-07 DIAGNOSIS — D631 Anemia in chronic kidney disease: Secondary | ICD-10-CM | POA: Diagnosis not present

## 2020-08-07 DIAGNOSIS — E875 Hyperkalemia: Secondary | ICD-10-CM | POA: Diagnosis not present

## 2020-08-07 DIAGNOSIS — N186 End stage renal disease: Secondary | ICD-10-CM | POA: Diagnosis not present

## 2020-08-07 DIAGNOSIS — D509 Iron deficiency anemia, unspecified: Secondary | ICD-10-CM | POA: Diagnosis not present

## 2020-08-07 DIAGNOSIS — Z992 Dependence on renal dialysis: Secondary | ICD-10-CM | POA: Diagnosis not present

## 2020-08-07 DIAGNOSIS — N2581 Secondary hyperparathyroidism of renal origin: Secondary | ICD-10-CM | POA: Diagnosis not present

## 2020-08-09 DIAGNOSIS — E875 Hyperkalemia: Secondary | ICD-10-CM | POA: Diagnosis not present

## 2020-08-09 DIAGNOSIS — D631 Anemia in chronic kidney disease: Secondary | ICD-10-CM | POA: Diagnosis not present

## 2020-08-09 DIAGNOSIS — N186 End stage renal disease: Secondary | ICD-10-CM | POA: Diagnosis not present

## 2020-08-09 DIAGNOSIS — N2581 Secondary hyperparathyroidism of renal origin: Secondary | ICD-10-CM | POA: Diagnosis not present

## 2020-08-09 DIAGNOSIS — D509 Iron deficiency anemia, unspecified: Secondary | ICD-10-CM | POA: Diagnosis not present

## 2020-08-09 DIAGNOSIS — Z992 Dependence on renal dialysis: Secondary | ICD-10-CM | POA: Diagnosis not present

## 2020-08-10 DIAGNOSIS — R519 Headache, unspecified: Secondary | ICD-10-CM | POA: Diagnosis not present

## 2020-08-10 DIAGNOSIS — H5713 Ocular pain, bilateral: Secondary | ICD-10-CM | POA: Diagnosis not present

## 2020-08-11 DIAGNOSIS — N186 End stage renal disease: Secondary | ICD-10-CM | POA: Diagnosis not present

## 2020-08-11 DIAGNOSIS — D631 Anemia in chronic kidney disease: Secondary | ICD-10-CM | POA: Diagnosis not present

## 2020-08-11 DIAGNOSIS — E875 Hyperkalemia: Secondary | ICD-10-CM | POA: Diagnosis not present

## 2020-08-11 DIAGNOSIS — N2581 Secondary hyperparathyroidism of renal origin: Secondary | ICD-10-CM | POA: Diagnosis not present

## 2020-08-11 DIAGNOSIS — Z992 Dependence on renal dialysis: Secondary | ICD-10-CM | POA: Diagnosis not present

## 2020-08-11 DIAGNOSIS — D509 Iron deficiency anemia, unspecified: Secondary | ICD-10-CM | POA: Diagnosis not present

## 2020-08-14 DIAGNOSIS — N186 End stage renal disease: Secondary | ICD-10-CM | POA: Diagnosis not present

## 2020-08-14 DIAGNOSIS — D509 Iron deficiency anemia, unspecified: Secondary | ICD-10-CM | POA: Diagnosis not present

## 2020-08-14 DIAGNOSIS — N2581 Secondary hyperparathyroidism of renal origin: Secondary | ICD-10-CM | POA: Diagnosis not present

## 2020-08-14 DIAGNOSIS — D631 Anemia in chronic kidney disease: Secondary | ICD-10-CM | POA: Diagnosis not present

## 2020-08-14 DIAGNOSIS — Z992 Dependence on renal dialysis: Secondary | ICD-10-CM | POA: Diagnosis not present

## 2020-08-14 DIAGNOSIS — E875 Hyperkalemia: Secondary | ICD-10-CM | POA: Diagnosis not present

## 2020-08-16 DIAGNOSIS — E875 Hyperkalemia: Secondary | ICD-10-CM | POA: Diagnosis not present

## 2020-08-16 DIAGNOSIS — N2581 Secondary hyperparathyroidism of renal origin: Secondary | ICD-10-CM | POA: Diagnosis not present

## 2020-08-16 DIAGNOSIS — Z992 Dependence on renal dialysis: Secondary | ICD-10-CM | POA: Diagnosis not present

## 2020-08-16 DIAGNOSIS — D509 Iron deficiency anemia, unspecified: Secondary | ICD-10-CM | POA: Diagnosis not present

## 2020-08-16 DIAGNOSIS — N186 End stage renal disease: Secondary | ICD-10-CM | POA: Diagnosis not present

## 2020-08-16 DIAGNOSIS — D631 Anemia in chronic kidney disease: Secondary | ICD-10-CM | POA: Diagnosis not present

## 2020-08-18 DIAGNOSIS — Z992 Dependence on renal dialysis: Secondary | ICD-10-CM | POA: Diagnosis not present

## 2020-08-18 DIAGNOSIS — E875 Hyperkalemia: Secondary | ICD-10-CM | POA: Diagnosis not present

## 2020-08-18 DIAGNOSIS — N186 End stage renal disease: Secondary | ICD-10-CM | POA: Diagnosis not present

## 2020-08-18 DIAGNOSIS — D509 Iron deficiency anemia, unspecified: Secondary | ICD-10-CM | POA: Diagnosis not present

## 2020-08-18 DIAGNOSIS — N2581 Secondary hyperparathyroidism of renal origin: Secondary | ICD-10-CM | POA: Diagnosis not present

## 2020-08-18 DIAGNOSIS — D631 Anemia in chronic kidney disease: Secondary | ICD-10-CM | POA: Diagnosis not present

## 2020-08-21 DIAGNOSIS — N2581 Secondary hyperparathyroidism of renal origin: Secondary | ICD-10-CM | POA: Diagnosis not present

## 2020-08-21 DIAGNOSIS — D509 Iron deficiency anemia, unspecified: Secondary | ICD-10-CM | POA: Diagnosis not present

## 2020-08-21 DIAGNOSIS — E875 Hyperkalemia: Secondary | ICD-10-CM | POA: Diagnosis not present

## 2020-08-21 DIAGNOSIS — D631 Anemia in chronic kidney disease: Secondary | ICD-10-CM | POA: Diagnosis not present

## 2020-08-21 DIAGNOSIS — Z992 Dependence on renal dialysis: Secondary | ICD-10-CM | POA: Diagnosis not present

## 2020-08-21 DIAGNOSIS — N186 End stage renal disease: Secondary | ICD-10-CM | POA: Diagnosis not present

## 2020-08-23 DIAGNOSIS — E875 Hyperkalemia: Secondary | ICD-10-CM | POA: Diagnosis not present

## 2020-08-23 DIAGNOSIS — D509 Iron deficiency anemia, unspecified: Secondary | ICD-10-CM | POA: Diagnosis not present

## 2020-08-23 DIAGNOSIS — N2581 Secondary hyperparathyroidism of renal origin: Secondary | ICD-10-CM | POA: Diagnosis not present

## 2020-08-23 DIAGNOSIS — D631 Anemia in chronic kidney disease: Secondary | ICD-10-CM | POA: Diagnosis not present

## 2020-08-23 DIAGNOSIS — N186 End stage renal disease: Secondary | ICD-10-CM | POA: Diagnosis not present

## 2020-08-23 DIAGNOSIS — Z992 Dependence on renal dialysis: Secondary | ICD-10-CM | POA: Diagnosis not present

## 2020-08-25 DIAGNOSIS — D509 Iron deficiency anemia, unspecified: Secondary | ICD-10-CM | POA: Diagnosis not present

## 2020-08-25 DIAGNOSIS — N186 End stage renal disease: Secondary | ICD-10-CM | POA: Diagnosis not present

## 2020-08-25 DIAGNOSIS — D631 Anemia in chronic kidney disease: Secondary | ICD-10-CM | POA: Diagnosis not present

## 2020-08-25 DIAGNOSIS — E875 Hyperkalemia: Secondary | ICD-10-CM | POA: Diagnosis not present

## 2020-08-25 DIAGNOSIS — Z992 Dependence on renal dialysis: Secondary | ICD-10-CM | POA: Diagnosis not present

## 2020-08-25 DIAGNOSIS — N2581 Secondary hyperparathyroidism of renal origin: Secondary | ICD-10-CM | POA: Diagnosis not present

## 2020-08-28 DIAGNOSIS — E875 Hyperkalemia: Secondary | ICD-10-CM | POA: Diagnosis not present

## 2020-08-28 DIAGNOSIS — Z992 Dependence on renal dialysis: Secondary | ICD-10-CM | POA: Diagnosis not present

## 2020-08-28 DIAGNOSIS — D509 Iron deficiency anemia, unspecified: Secondary | ICD-10-CM | POA: Diagnosis not present

## 2020-08-28 DIAGNOSIS — N186 End stage renal disease: Secondary | ICD-10-CM | POA: Diagnosis not present

## 2020-08-28 DIAGNOSIS — D631 Anemia in chronic kidney disease: Secondary | ICD-10-CM | POA: Diagnosis not present

## 2020-08-28 DIAGNOSIS — N2581 Secondary hyperparathyroidism of renal origin: Secondary | ICD-10-CM | POA: Diagnosis not present

## 2020-08-29 ENCOUNTER — Ambulatory Visit: Payer: Medicare Other | Admitting: Internal Medicine

## 2020-08-30 DIAGNOSIS — N2581 Secondary hyperparathyroidism of renal origin: Secondary | ICD-10-CM | POA: Diagnosis not present

## 2020-08-30 DIAGNOSIS — E875 Hyperkalemia: Secondary | ICD-10-CM | POA: Diagnosis not present

## 2020-08-30 DIAGNOSIS — N186 End stage renal disease: Secondary | ICD-10-CM | POA: Diagnosis not present

## 2020-08-30 DIAGNOSIS — D631 Anemia in chronic kidney disease: Secondary | ICD-10-CM | POA: Diagnosis not present

## 2020-08-30 DIAGNOSIS — D509 Iron deficiency anemia, unspecified: Secondary | ICD-10-CM | POA: Diagnosis not present

## 2020-08-30 DIAGNOSIS — Z992 Dependence on renal dialysis: Secondary | ICD-10-CM | POA: Diagnosis not present

## 2020-09-01 DIAGNOSIS — D509 Iron deficiency anemia, unspecified: Secondary | ICD-10-CM | POA: Diagnosis not present

## 2020-09-01 DIAGNOSIS — D631 Anemia in chronic kidney disease: Secondary | ICD-10-CM | POA: Diagnosis not present

## 2020-09-01 DIAGNOSIS — E875 Hyperkalemia: Secondary | ICD-10-CM | POA: Diagnosis not present

## 2020-09-01 DIAGNOSIS — Z992 Dependence on renal dialysis: Secondary | ICD-10-CM | POA: Diagnosis not present

## 2020-09-01 DIAGNOSIS — N2581 Secondary hyperparathyroidism of renal origin: Secondary | ICD-10-CM | POA: Diagnosis not present

## 2020-09-01 DIAGNOSIS — N186 End stage renal disease: Secondary | ICD-10-CM | POA: Diagnosis not present

## 2020-09-04 DIAGNOSIS — I129 Hypertensive chronic kidney disease with stage 1 through stage 4 chronic kidney disease, or unspecified chronic kidney disease: Secondary | ICD-10-CM | POA: Diagnosis not present

## 2020-09-04 DIAGNOSIS — Z992 Dependence on renal dialysis: Secondary | ICD-10-CM | POA: Diagnosis not present

## 2020-09-04 DIAGNOSIS — N2581 Secondary hyperparathyroidism of renal origin: Secondary | ICD-10-CM | POA: Diagnosis not present

## 2020-09-04 DIAGNOSIS — D509 Iron deficiency anemia, unspecified: Secondary | ICD-10-CM | POA: Diagnosis not present

## 2020-09-04 DIAGNOSIS — D631 Anemia in chronic kidney disease: Secondary | ICD-10-CM | POA: Diagnosis not present

## 2020-09-04 DIAGNOSIS — N186 End stage renal disease: Secondary | ICD-10-CM | POA: Diagnosis not present

## 2020-09-04 DIAGNOSIS — E875 Hyperkalemia: Secondary | ICD-10-CM | POA: Diagnosis not present

## 2020-09-05 ENCOUNTER — Encounter: Payer: Medicare Other | Admitting: Gastroenterology

## 2020-09-08 DIAGNOSIS — D631 Anemia in chronic kidney disease: Secondary | ICD-10-CM | POA: Diagnosis not present

## 2020-09-08 DIAGNOSIS — D509 Iron deficiency anemia, unspecified: Secondary | ICD-10-CM | POA: Diagnosis not present

## 2020-09-08 DIAGNOSIS — N186 End stage renal disease: Secondary | ICD-10-CM | POA: Diagnosis not present

## 2020-09-08 DIAGNOSIS — N2581 Secondary hyperparathyroidism of renal origin: Secondary | ICD-10-CM | POA: Diagnosis not present

## 2020-09-08 DIAGNOSIS — E875 Hyperkalemia: Secondary | ICD-10-CM | POA: Diagnosis not present

## 2020-09-08 DIAGNOSIS — Z992 Dependence on renal dialysis: Secondary | ICD-10-CM | POA: Diagnosis not present

## 2020-09-11 DIAGNOSIS — N186 End stage renal disease: Secondary | ICD-10-CM | POA: Diagnosis not present

## 2020-09-11 DIAGNOSIS — Z992 Dependence on renal dialysis: Secondary | ICD-10-CM | POA: Diagnosis not present

## 2020-09-11 DIAGNOSIS — N2581 Secondary hyperparathyroidism of renal origin: Secondary | ICD-10-CM | POA: Diagnosis not present

## 2020-09-11 DIAGNOSIS — D509 Iron deficiency anemia, unspecified: Secondary | ICD-10-CM | POA: Diagnosis not present

## 2020-09-11 DIAGNOSIS — D631 Anemia in chronic kidney disease: Secondary | ICD-10-CM | POA: Diagnosis not present

## 2020-09-11 DIAGNOSIS — E875 Hyperkalemia: Secondary | ICD-10-CM | POA: Diagnosis not present

## 2020-09-13 DIAGNOSIS — D631 Anemia in chronic kidney disease: Secondary | ICD-10-CM | POA: Diagnosis not present

## 2020-09-13 DIAGNOSIS — D509 Iron deficiency anemia, unspecified: Secondary | ICD-10-CM | POA: Diagnosis not present

## 2020-09-13 DIAGNOSIS — N186 End stage renal disease: Secondary | ICD-10-CM | POA: Diagnosis not present

## 2020-09-13 DIAGNOSIS — Z992 Dependence on renal dialysis: Secondary | ICD-10-CM | POA: Diagnosis not present

## 2020-09-13 DIAGNOSIS — E875 Hyperkalemia: Secondary | ICD-10-CM | POA: Diagnosis not present

## 2020-09-13 DIAGNOSIS — N2581 Secondary hyperparathyroidism of renal origin: Secondary | ICD-10-CM | POA: Diagnosis not present

## 2020-09-14 DIAGNOSIS — H40023 Open angle with borderline findings, high risk, bilateral: Secondary | ICD-10-CM | POA: Diagnosis not present

## 2020-09-15 DIAGNOSIS — N186 End stage renal disease: Secondary | ICD-10-CM | POA: Diagnosis not present

## 2020-09-15 DIAGNOSIS — Z992 Dependence on renal dialysis: Secondary | ICD-10-CM | POA: Diagnosis not present

## 2020-09-15 DIAGNOSIS — D509 Iron deficiency anemia, unspecified: Secondary | ICD-10-CM | POA: Diagnosis not present

## 2020-09-15 DIAGNOSIS — E875 Hyperkalemia: Secondary | ICD-10-CM | POA: Diagnosis not present

## 2020-09-15 DIAGNOSIS — D631 Anemia in chronic kidney disease: Secondary | ICD-10-CM | POA: Diagnosis not present

## 2020-09-15 DIAGNOSIS — N2581 Secondary hyperparathyroidism of renal origin: Secondary | ICD-10-CM | POA: Diagnosis not present

## 2020-09-18 DIAGNOSIS — N2581 Secondary hyperparathyroidism of renal origin: Secondary | ICD-10-CM | POA: Diagnosis not present

## 2020-09-18 DIAGNOSIS — D509 Iron deficiency anemia, unspecified: Secondary | ICD-10-CM | POA: Diagnosis not present

## 2020-09-18 DIAGNOSIS — Z992 Dependence on renal dialysis: Secondary | ICD-10-CM | POA: Diagnosis not present

## 2020-09-18 DIAGNOSIS — E875 Hyperkalemia: Secondary | ICD-10-CM | POA: Diagnosis not present

## 2020-09-18 DIAGNOSIS — N186 End stage renal disease: Secondary | ICD-10-CM | POA: Diagnosis not present

## 2020-09-18 DIAGNOSIS — D631 Anemia in chronic kidney disease: Secondary | ICD-10-CM | POA: Diagnosis not present

## 2020-09-19 ENCOUNTER — Telehealth: Payer: Self-pay | Admitting: Radiology

## 2020-09-19 ENCOUNTER — Encounter: Payer: Self-pay | Admitting: Internal Medicine

## 2020-09-19 ENCOUNTER — Ambulatory Visit (INDEPENDENT_AMBULATORY_CARE_PROVIDER_SITE_OTHER): Payer: Medicare Other | Admitting: Internal Medicine

## 2020-09-19 ENCOUNTER — Other Ambulatory Visit: Payer: Self-pay

## 2020-09-19 VITALS — BP 140/94 | HR 74 | Ht 68.0 in | Wt 129.0 lb

## 2020-09-19 DIAGNOSIS — R55 Syncope and collapse: Secondary | ICD-10-CM

## 2020-09-19 DIAGNOSIS — Z992 Dependence on renal dialysis: Secondary | ICD-10-CM

## 2020-09-19 DIAGNOSIS — I5043 Acute on chronic combined systolic (congestive) and diastolic (congestive) heart failure: Secondary | ICD-10-CM | POA: Diagnosis not present

## 2020-09-19 DIAGNOSIS — I10 Essential (primary) hypertension: Secondary | ICD-10-CM | POA: Diagnosis not present

## 2020-09-19 DIAGNOSIS — N186 End stage renal disease: Secondary | ICD-10-CM

## 2020-09-19 NOTE — Progress Notes (Signed)
Cardiology Office Note:    Date:  09/19/2020   ID:  Marvin Roberson, DOB Mar 17, 1971, MRN [id number]  PCP:  Sonia Side., FNP  [facility] Cardiologist:  No primary care provider on file.  [facility] Electrophysiologist:  None   CC; Syncope Consulted for the evaluation of dizziness and lightheadedness at the behest of [name], [name]., FNP  History of Present Illness:    Marvin Roberson is a 49 y.o. male with a hx of HFrEF (2015 30-35%), HTN, Prior DVT and PE of unclear etiology, Prior COVID PNA, ESRD (still makes urine), Prior CVA, Prior Staph Bacteremia without IE (10/2012 [facility] with negative TEE), possible history of aortitis [facility] 2017, who presents for evaluation.  Patient notes that he had an episode where he felt the room spinning while driving.  Notes that he felt nauseous and vomiting.  Never had a bit of light-headedness before, but not this bad.  No chest pain, chest pressure, chest tightness or chest stinging.l  Has occasional heartburn after laying down and eating.  Still makes urine. Has palpitations that last a few seconds.  Last time he felt this was every two weeks.  Has felt it maybe once a month.  Patient is pretty inactive when he is in [place].    Ambulatory blood pressure not done.  Past Medical History:  Diagnosis Date  . Adrenal insufficiency ([place])   . Allergy   . Anxiety   . Depression   . Dyspnea    "when I have too much fluid"  . ESRD on dialysis [facility]) since 1990s   "M,W,F; [address]" (11/27/2016)  . GERD (gastroesophageal reflux disease)   . High cholesterol   . History of blood transfusion   . Hypertension   . Kidney failure   . Renal insufficiency   . Stroke [facility]) 2016   decreased vision in his left eye/notes 11/27/2016    Past Surgical History:  Procedure Laterality Date  . ANGIOPLASTY Left 12/11/2016   Procedure: ANGIOPLASTY LEFT ARM & SUBCLAVIAN ARTERY;  Surgeon: Conrad [place], MD;  Location: [place];  Service: Vascular;  Laterality:  Left;  . arm surgery Left 2016   "for aneurysm"  . AV FISTULA PLACEMENT    . FALSE ANEURYSM REPAIR Left 12/11/2016   Procedure: RESECTION  BRACHIAL ARTERY;  Surgeon: Conrad Hoot Owl, MD;  Location: [place];  Service: Vascular;  Laterality: Left;  . FALSE ANEURYSM REPAIR Left 01/14/2018   Procedure: REPAIR FALSE ANEURYSM LEFT BRACHIAL ARTERY WITH SAPHENOUS VEIN  LEFT ARTERIOVENOUS GRAFT;  Surgeon: Rosetta Posner, MD;  Location: [place];  Service: Vascular;  Laterality: Left;  . INSERTION OF DIALYSIS CATHETER Right    chest  . IR FLUORO GUIDE CV LINE RIGHT  11/17/2019  . THROMBECTOMY BRACHIAL ARTERY  12/11/2016   Procedure: THROMBECTOMY BRACHIAL ARTERY AND ULNAR;  Surgeon: Conrad [place], MD;  Location: [place];  Service: Vascular;;  . THYROIDECTOMY, PARTIAL    . UPPER EXTREMITY ANGIOGRAM Left 12/11/2016   Procedure: LEFT ARM ANGIOGRAM;  Surgeon: Conrad Mangham, MD;  Location: [place];  Service: Vascular;  Laterality: Left;  Marland Kitchen VEIN HARVEST Left 01/14/2018   Procedure: VEIN HARVEST LEFT SAPHENOUS VEIN;  Surgeon: Rosetta Posner, MD;  Location: [place];  Service: Vascular;  Laterality: Left;  . WOUND EXPLORATION Left 12/11/2016   Procedure: LEFT ARM BRACHIAL ARTERY WITH INTERPOSTIONAL GRAFT;  Surgeon: Conrad [place], MD;  Location: [place];  Service: Vascular;  Laterality: Left;  Current Medications: Current Meds  Medication Sig  . acetaminophen (TYLENOL) 500 MG tablet Take 500 mg by mouth every 6 (six) hours as needed for mild pain.   Marland Kitchen amLODipine (NORVASC) 10 MG tablet Take 10 mg by mouth at bedtime.   Marland Kitchen atorvastatin (LIPITOR) 40 MG tablet Take 40 mg by mouth daily.   . calcium acetate (PHOSLO) 667 MG capsule Take 2,001 mg by mouth 3 (three) times daily with meals.   . carvedilol (COREG) 25 MG tablet Take 25 mg by mouth 2 (two) times daily with a meal.  . cetirizine (ZYRTEC) 10 MG tablet Take 10 mg by mouth daily as needed for allergies.   . folic acid-vitamin b complex-vitamin c-selenium-zinc (DIALYVITE) 3 MG TABS tablet  Take 1 tablet by mouth daily.  Marland Kitchen guaiFENesin-dextromethorphan (ROBITUSSIN DM) 100-10 MG/5ML syrup Take 10 mLs by mouth every 4 (four) hours as needed for cough.  . hydrALAZINE (APRESOLINE) 100 MG tablet Take 100 mg by mouth 3 (three) times daily.  . hydrocortisone (CORTEF) 5 MG tablet Take 2.5 mg by mouth 2 (two) times daily.  . pantoprazole (PROTONIX) 40 MG tablet Take 40 mg by mouth daily.    Allergies:   Doxycycline, Codeine, Gabapentin, Lisinopril, Povidone-iodine, and Betadine [povidone iodine]   Social History   Socioeconomic History  . Marital status: Single    Spouse name: Not on file  . Number of children: Not on file  . Years of education: Not on file  . Highest education level: Not on file  Occupational History  . Not on file  Tobacco Use  . Smoking status: Current Every Day Smoker    Packs/day: 0.25    Years: 20.00    Pack years: 5.00    Types: Cigarettes  . Smokeless tobacco: Never Used  . Tobacco comment: 1 pk every 4 days.  Vaping Use  . Vaping Use: Never used  Substance and Sexual Activity  . Alcohol use: Not Currently  . Drug use: No  . Sexual activity: Yes  Other Topics Concern  . Not on file  Social History Narrative  . Not on file   Social Determinants of Health   Financial Resource Strain:   . Difficulty of Paying Living Expenses: Not on file  Food Insecurity:   . Worried About Charity fundraiser in the Last Year: Not on file  . Ran Out of Food in the Last Year: Not on file  Transportation Needs:   . Lack of Transportation (Medical): Not on file  . Lack of Transportation (Non-Medical): Not on file  Physical Activity:   . Days of Exercise per Week: Not on file  . Minutes of Exercise per Session: Not on file  Stress:   . Feeling of Stress : Not on file  Social Connections:   . Frequency of Communication with Friends and Family: Not on file  . Frequency of Social Gatherings with Friends and Family: Not on file  . Attends Religious Services:  Not on file  . Active Member of Clubs or Organizations: Not on file  . Attends Archivist Meetings: Not on file  . Marital Status: Not on file    Family History: [name] family history includes Cancer in his brother; Heart attack in his mother; Hypertension in an other family member. There is no history of Adrenal disorder, Colon cancer, Rectal cancer, or Stomach cancer. No   ROS:   Please see the history of present illness.    All other systems reviewed  and are negative.  EKGs/Labs/Other Studies Reviewed:    The following studies were reviewed today:  EKG:   08/07/20 SR 70 1st HB, LAFB, LVH with repol abnormality  Recent Labs: 06/16/2020: ALT 10 08/05/2020: BUN 27; Creatinine, Ser 8.06; Hemoglobin 10.6; Platelets 131; Potassium 4.1; Sodium 137  Recent Lipid Panel No results found for: CHOL, TRIG, HDL, CHOLHDL, VLDL, LDLCALC, LDLDIRECT  01/13/2018 CT Angio - personally reviewed:  AA 37 mm; no coronary or aortic calcifications.  OSH Records [facility] Echo 2015 SUMMARY  The left ventricle is mildly dilated.  There is mild concentric left ventricular hypertrophy.   Left ventricular systolic function is moderately reduced.  LV ejection fraction = 30-35%.   Left ventricular filling pattern is restrictive.  The right ventricle is normal in size and function.  The left atrium is moderately dilated.  The right atrium is mildly to moderately dilated.  There is mild mitral regurgitation.  There is mild to moderate tricuspid regurgitation.  Small pericardial effusion.  When compared to study dated 08/01/2014, the left ventricular systolic  function has improved mildly and the tricuspid regurgitation is less  prominent.   Physical Exam:    VS:  BP (!) 140/94   Pulse 74   Ht 5\' 8"  (1.727 m)   Wt 129 lb (58.5 kg)   SpO2 98%   BMI 19.61 kg/m     Wt Readings from Last 3 Encounters:  09/19/20 129 lb (58.5 kg)  08/05/20 130 lb (59 kg)  06/16/20 125 lb 10.6 oz (57  kg)     GEN: Well nourished, well developed in no acute distress HEENT: Normal NECK: No JVD; No carotid bruits LYMPHATICS: No lymphadenopathy CARDIAC: RRR, no murmurs, rubs, gallops RESPIRATORY:  Clear to auscultation without rales, wheezing or rhonchi  ABDOMEN: Soft, non-tender, non-distended MUSCULOSKELETAL:  No edema; No deformity  SKIN: Warm and dry NEUROLOGIC:  Alert and oriented x 3 PSYCHIATRIC:  Normal affect   ASSESSMENT:    1. Syncope and collapse   2. Acute on chronic combined systolic and diastolic CHF (congestive heart failure) ([place])   3. Essential hypertension   4. ESRD on dialysis [facility])    PLAN:    In order of problems listed above:  Syncope Hx of HFrEF (ACEi allergy) HTN Hx of Aortitis Continue Coreg, and Hydralazine - will get echocardiogram - given prior hx, will place 30 Day [employer] Monitor live for eval of VT - will get old records from [facility] about whether or not he had aortitis - will see back in 6-8 weeks for eval of GDMT  Shared Decision Making/Informed Consent       Medication Adjustments/Labs and Tests Ordered: Current medicines are reviewed at length with the patient today.  Concerns regarding medicines are outlined above.  Orders Placed This Encounter  Procedures  . CARDIAC EVENT MONITOR  . ECHOCARDIOGRAM COMPLETE   No orders of the defined types were placed in this encounter.   Patient Instructions  Medication Instructions:  Your physician recommends that you continue on your current medications as directed. Please refer to the Current Medication list given to you today.  *If you need a refill on your cardiac medications before your next appointment, please call your pharmacy*   Lab Work: None If you have labs (blood work) drawn today and your tests are completely normal, you will receive your results only by: Marland Kitchen MyChart Message (if you have MyChart) OR . A paper copy in the mail If you have any  lab test  that is abnormal or we need to change your treatment, we will call you to review the results.   Testing/Procedures: Your physician has recommended that you wear an event monitor. Event monitors are medical devices that record the heart's electrical activity. Doctors most often [place] these monitors to diagnose arrhythmias. Arrhythmias are problems with the speed or rhythm of the heartbeat. The monitor is a small, portable device. You can wear one while you do your normal daily activities. This is usually used to diagnose what is causing palpitations/syncope (passing out).  Your physician has requested that you have an echocardiogram. Echocardiography is a painless test that uses sound waves to create images of your heart. It provides your doctor with information about the size and shape of your heart and how well your heart's chambers and valves are working. This procedure takes approximately one hour. There are no restrictions for this procedure.    Follow-Up: At [facility], you and your health needs are our priority.  As part of our continuing mission to provide you with exceptional heart care, we have created designated Provider Care Teams.  These Care Teams include your primary Cardiologist (physician) and Advanced Practice Providers (APPs -  Physician Assistants and Nurse Practitioners) who all work together to provide you with the care you need, when you need it.  We recommend signing up for the patient portal called "MyChart".  Sign up information is provided on this After Visit Summary.  MyChart is used to connect with patients for Virtual Visits (Telemedicine).  Patients are able to view lab/test results, encounter notes, upcoming appointments, etc.  Non-urgent messages can be sent to your provider as well.   To learn more about what you can do with MyChart, go to [email or web address].    Your next appointment:   6-8 week(s)  The format for your next appointment:   In  Person  Provider:   Rudean Haskell, MD   Other Instructions Check your blood pressure at home. If it is consistently over 135/85 call or send a MyChart message to let us know.   [employer] Cardiac Event Monitor Instructions Your physician has requested you wear your cardiac event monitor for __30___ days, (1-30). [employer] may call or text to confirm a shipping address. The monitor will be sent to a land address via UPS. Preventice will not ship a monitor to a PO BOX. It typically takes 3-5 days to receive your monitor after it has been enrolled. [employer] will assist with USPS tracking if your package is delayed. The telephone number for [employer]ice is [phone]. Once you have received your monitor, please review the enclosed instructions. Instruction tutorials can also be viewed under help and settings on the enclosed cell phone. Your monitor has already been registered assigning a specific monitor serial # to you.  Applying the monitor Remove cell phone from case and turn it on. The cell phone works as Dealer and needs to be within [employer] of you at all times. The cell phone will need to be charged on a daily basis. We recommend you plug the cell phone into the enclosed charger at your bedside table every night.  Monitor batteries: You will receive two monitor batteries labelled #1 and #2. These are your recorders. Plug battery #2 onto the second connection on the enclosed charger. Keep one battery on the charger at all times. This will keep the monitor battery deactivated. It will also keep it fully charged for when you need to switch  your monitor batteries. A small light will be blinking on the battery emblem when it is charging. The light on the battery emblem will remain on when the battery is fully charged.  Open package of a Monitor strip. Insert battery #1 into black hood on strip and gently squeeze monitor battery onto connection as indicated in  instruction booklet. Set aside while preparing skin.  Choose location for your strip, vertical or horizontal, as indicated in the instruction booklet. Shave to remove all hair from location. There cannot be any lotions, oils, powders, or colognes on skin where monitor is to be applied. Wipe skin clean with enclosed Saline wipe. Dry skin completely.  Peel paper labeled #1 off the back of the Monitor strip exposing the adhesive. Place the monitor on the chest in the vertical or horizontal position shown in the instruction booklet. One arrow on the monitor strip must be pointing upward. Carefully remove paper labeled #2, attaching remainder of strip to your skin. Try not to create any folds or wrinkles in the strip as you apply it.  Firmly press and release the circle in the center of the monitor battery. You will hear a small beep. This is turning the monitor battery on. The heart emblem on the monitor battery will light up every 5 seconds if the monitor battery in turned on and connected to the patient securely. Do not push and hold the circle down as this turns the monitor battery off. The cell phone will locate the monitor battery. A screen will appear on the cell phone checking the connection of your monitor strip. This may read poor connection initially but change to good connection within the next minute. Once your monitor accepts the connection you will hear a series of 3 beeps followed by a climbing crescendo of beeps. A screen will appear on the cell phone showing the two monitor strip placement options. Touch the picture that demonstrates where you applied the monitor strip.  Your monitor strip and battery are waterproof. You are able to shower, bathe, or swim with the monitor on. They just ask you do not submerge deeper than 3 feet underwater. We recommend removing the monitor if you are swimming in a lake, river, or ocean.  Your monitor battery will need to be switched to a fully  charged monitor battery approximately once a week. The cell phone will alert you of an action which needs to be made.  On the cell phone, tap for details to reveal connection status, monitor battery status, and cell phone battery status. The green dots indicates your monitor is in good status. A red dot indicates there is something that needs your attention.  To record a symptom, click the circle on the monitor battery. In 30-60 seconds a list of symptoms will appear on the cell phone. Select your symptom and tap save. Your monitor will record a sustained or significant arrhythmia regardless of you clicking the button. Some patients do not feel the heart rhythm irregularities. [employer] will notify us of any serious or critical events.  Refer to instruction booklet for instructions on switching batteries, changing strips, the Do not disturb or Pause features, or any additional questions.  Call [employer] at [phone], to confirm your monitor is transmitting and record your baseline. They will answer any questions you may have regarding the monitor instructions at that time.  Returning the monitor to [place] all equipment back into blue box. Peel off strip of paper to expose adhesive and close box  securely. There is a prepaid UPS shipping label on this box. Drop in a UPS drop box, or at a UPS facility like [employer]. You may also contact [employer] to arrange UPS to pick up monitor package at your home.      Signed, Werner Lean, MD  09/19/2020 4:25 PM    [facility]

## 2020-09-19 NOTE — Telephone Encounter (Signed)
Enrolled patient for a 30 day [employer] Event Monitor to be mailed to patients home  

## 2020-09-19 NOTE — Patient Instructions (Addendum)
Medication Instructions:  Your physician recommends that you continue on your current medications as directed. Please refer to the Current Medication list given to you today.  *If you need a refill on your cardiac medications before your next appointment, please call your pharmacy*   Lab Work: None If you have labs (blood work) drawn today and your tests are completely normal, you will receive your results only by: Marland Kitchen MyChart Message (if you have MyChart) OR . A paper copy in the mail If you have any lab test that is abnormal or we need to change your treatment, we will call you to review the results.   Testing/Procedures: Your physician has recommended that you wear an event monitor. Event monitors are medical devices that record the heart's electrical activity. Doctors most often [place] these monitors to diagnose arrhythmias. Arrhythmias are problems with the speed or rhythm of the heartbeat. The monitor is a small, portable device. You can wear one while you do your normal daily activities. This is usually used to diagnose what is causing palpitations/syncope (passing out).  Your physician has requested that you have an echocardiogram. Echocardiography is a painless test that uses sound waves to create images of your heart. It provides your doctor with information about the size and shape of your heart and how well your heart's chambers and valves are working. This procedure takes approximately one hour. There are no restrictions for this procedure.    Follow-Up: At [facility], you and your health needs are our priority.  As part of our continuing mission to provide you with exceptional heart care, we have created designated Provider Care Teams.  These Care Teams include your primary Cardiologist (physician) and Advanced Practice Providers (APPs -  Physician Assistants and Nurse Practitioners) who all work together to provide you with the care you need, when you need it.  We recommend  signing up for the patient portal called "MyChart".  Sign up information is provided on this After Visit Summary.  MyChart is used to connect with patients for Virtual Visits (Telemedicine).  Patients are able to view lab/test results, encounter notes, upcoming appointments, etc.  Non-urgent messages can be sent to your provider as well.   To learn more about what you can do with MyChart, go to [email or web address].    Your next appointment:   6-8 week(s)  The format for your next appointment:   In Person  Provider:   Rudean Haskell, MD   Other Instructions Check your blood pressure at home. If it is consistently over 135/85 call or send a MyChart message to let us know.   [employer] Cardiac Event Monitor Instructions Your physician has requested you wear your cardiac event monitor for __30___ days, (1-30). [employer] may call or text to confirm a shipping address. The monitor will be sent to a land address via UPS. [employer] will not ship a monitor to a PO BOX. It typically takes 3-5 days to receive your monitor after it has been enrolled. [employer] will assist with USPS tracking if your package is delayed. The telephone number for [employer] is [phone]. Once you have received your monitor, please review the enclosed instructions. Instruction tutorials can also be viewed under help and settings on the enclosed cell phone. Your monitor has already been registered assigning a specific monitor serial # to you.  Applying the monitor Remove cell phone from case and turn it on. The cell phone works as Dealer and needs to be within [employer] of you at  all times. The cell phone will need to be charged on a daily basis. We recommend you plug the cell phone into the enclosed charger at your bedside table every night.  Monitor batteries: You will receive two monitor batteries labelled #1 and #2. These are your recorders. Plug battery #2 onto the second connection on  the enclosed charger. Keep one battery on the charger at all times. This will keep the monitor battery deactivated. It will also keep it fully charged for when you need to switch your monitor batteries. A small light will be blinking on the battery emblem when it is charging. The light on the battery emblem will remain on when the battery is fully charged.  Open package of a Monitor strip. Insert battery #1 into black hood on strip and gently squeeze monitor battery onto connection as indicated in instruction booklet. Set aside while preparing skin.  Choose location for your strip, vertical or horizontal, as indicated in the instruction booklet. Shave to remove all hair from location. There cannot be any lotions, oils, powders, or colognes on skin where monitor is to be applied. Wipe skin clean with enclosed Saline wipe. Dry skin completely.  Peel paper labeled #1 off the back of the Monitor strip exposing the adhesive. Place the monitor on the chest in the vertical or horizontal position shown in the instruction booklet. One arrow on the monitor strip must be pointing upward. Carefully remove paper labeled #2, attaching remainder of strip to your skin. Try not to create any folds or wrinkles in the strip as you apply it.  Firmly press and release the circle in the center of the monitor battery. You will hear a small beep. This is turning the monitor battery on. The heart emblem on the monitor battery will light up every 5 seconds if the monitor battery in turned on and connected to the patient securely. Do not push and hold the circle down as this turns the monitor battery off. The cell phone will locate the monitor battery. A screen will appear on the cell phone checking the connection of your monitor strip. This may read poor connection initially but change to good connection within the next minute. Once your monitor accepts the connection you will hear a series of 3 beeps followed by a  climbing crescendo of beeps. A screen will appear on the cell phone showing the two monitor strip placement options. Touch the picture that demonstrates where you applied the monitor strip.  Your monitor strip and battery are waterproof. You are able to shower, bathe, or swim with the monitor on. They just ask you do not submerge deeper than 3 feet underwater. We recommend removing the monitor if you are swimming in a lake, river, or ocean.  Your monitor battery will need to be switched to a fully charged monitor battery approximately once a week. The cell phone will alert you of an action which needs to be made.  On the cell phone, tap for details to reveal connection status, monitor battery status, and cell phone battery status. The green dots indicates your monitor is in good status. A red dot indicates there is something that needs your attention.  To record a symptom, click the circle on the monitor battery. In 30-60 seconds a list of symptoms will appear on the cell phone. Select your symptom and tap save. Your monitor will record a sustained or significant arrhythmia regardless of you clicking the button. Some patients do not feel the heart rhythm  irregularities. [employer] will notify us of any serious or critical events.  Refer to instruction booklet for instructions on switching batteries, changing strips, the Do not disturb or Pause features, or any additional questions.  Call [employer] at [phone], to confirm your monitor is transmitting and record your baseline. They will answer any questions you may have regarding the monitor instructions at that time.  Returning the monitor to [place] all equipment back into blue box. Peel off strip of paper to expose adhesive and close box securely. There is a prepaid UPS shipping label on this box. Drop in a UPS drop box, or at a UPS facility like [employer]. You may also contact [employer] to arrange UPS to pick up monitor  package at your home.

## 2020-09-20 ENCOUNTER — Telehealth: Payer: Self-pay | Admitting: Internal Medicine

## 2020-09-20 DIAGNOSIS — E875 Hyperkalemia: Secondary | ICD-10-CM | POA: Diagnosis not present

## 2020-09-20 DIAGNOSIS — N186 End stage renal disease: Secondary | ICD-10-CM | POA: Diagnosis not present

## 2020-09-20 DIAGNOSIS — Z992 Dependence on renal dialysis: Secondary | ICD-10-CM | POA: Diagnosis not present

## 2020-09-20 DIAGNOSIS — N2581 Secondary hyperparathyroidism of renal origin: Secondary | ICD-10-CM | POA: Diagnosis not present

## 2020-09-20 DIAGNOSIS — D509 Iron deficiency anemia, unspecified: Secondary | ICD-10-CM | POA: Diagnosis not present

## 2020-09-20 DIAGNOSIS — D631 Anemia in chronic kidney disease: Secondary | ICD-10-CM | POA: Diagnosis not present

## 2020-09-20 NOTE — Telephone Encounter (Signed)
Received records from [facility]- previously ICD code for Aortitis.  Reviewed:  No evidence of aortitis through past history.

## 2020-09-22 DIAGNOSIS — D631 Anemia in chronic kidney disease: Secondary | ICD-10-CM | POA: Diagnosis not present

## 2020-09-22 DIAGNOSIS — D509 Iron deficiency anemia, unspecified: Secondary | ICD-10-CM | POA: Diagnosis not present

## 2020-09-22 DIAGNOSIS — Z992 Dependence on renal dialysis: Secondary | ICD-10-CM | POA: Diagnosis not present

## 2020-09-22 DIAGNOSIS — E875 Hyperkalemia: Secondary | ICD-10-CM | POA: Diagnosis not present

## 2020-09-22 DIAGNOSIS — N186 End stage renal disease: Secondary | ICD-10-CM | POA: Diagnosis not present

## 2020-09-22 DIAGNOSIS — N2581 Secondary hyperparathyroidism of renal origin: Secondary | ICD-10-CM | POA: Diagnosis not present

## 2020-09-22 NOTE — Telephone Encounter (Signed)
Additional records obtained.  EF 35% Severe TR with question of liver dysfunction in 2017.  Normal LFTs in our system at present.  Pending Echo.

## 2020-09-22 NOTE — Telephone Encounter (Signed)
Additional records obtained.  Prior ligation of RUE fistula.  No evidence that patient had Aortitis.

## 2020-09-26 DIAGNOSIS — N186 End stage renal disease: Secondary | ICD-10-CM | POA: Diagnosis not present

## 2020-09-26 DIAGNOSIS — Z992 Dependence on renal dialysis: Secondary | ICD-10-CM | POA: Diagnosis not present

## 2020-09-26 DIAGNOSIS — E875 Hyperkalemia: Secondary | ICD-10-CM | POA: Diagnosis not present

## 2020-09-26 DIAGNOSIS — D509 Iron deficiency anemia, unspecified: Secondary | ICD-10-CM | POA: Diagnosis not present

## 2020-09-26 DIAGNOSIS — D631 Anemia in chronic kidney disease: Secondary | ICD-10-CM | POA: Diagnosis not present

## 2020-09-26 DIAGNOSIS — N2581 Secondary hyperparathyroidism of renal origin: Secondary | ICD-10-CM | POA: Diagnosis not present

## 2020-09-29 ENCOUNTER — Other Ambulatory Visit: Payer: Self-pay

## 2020-09-29 ENCOUNTER — Encounter (HOSPITAL_COMMUNITY): Payer: Self-pay | Admitting: Emergency Medicine

## 2020-09-29 ENCOUNTER — Emergency Department (HOSPITAL_COMMUNITY)
Admission: EM | Admit: 2020-09-29 | Discharge: 2020-09-29 | Disposition: A | Discharge status: Home / Self Care | Payer: Medicare Other | Attending: Emergency Medicine | Admitting: Emergency Medicine

## 2020-09-29 ENCOUNTER — Emergency Department (HOSPITAL_COMMUNITY): Payer: Medicare Other

## 2020-09-29 DIAGNOSIS — I120 Hypertensive chronic kidney disease with stage 5 chronic kidney disease or end stage renal disease: Secondary | ICD-10-CM | POA: Diagnosis not present

## 2020-09-29 DIAGNOSIS — N186 End stage renal disease: Secondary | ICD-10-CM | POA: Insufficient documentation

## 2020-09-29 DIAGNOSIS — Z79899 Other long term (current) drug therapy: Secondary | ICD-10-CM | POA: Insufficient documentation

## 2020-09-29 DIAGNOSIS — F17210 Nicotine dependence, cigarettes, uncomplicated: Secondary | ICD-10-CM | POA: Insufficient documentation

## 2020-09-29 DIAGNOSIS — R059 Cough, unspecified: Secondary | ICD-10-CM | POA: Diagnosis not present

## 2020-09-29 DIAGNOSIS — Z20822 Contact with and (suspected) exposure to covid-19: Secondary | ICD-10-CM | POA: Insufficient documentation

## 2020-09-29 DIAGNOSIS — D509 Iron deficiency anemia, unspecified: Secondary | ICD-10-CM | POA: Diagnosis not present

## 2020-09-29 DIAGNOSIS — J189 Pneumonia, unspecified organism: Secondary | ICD-10-CM | POA: Insufficient documentation

## 2020-09-29 DIAGNOSIS — Z992 Dependence on renal dialysis: Secondary | ICD-10-CM | POA: Diagnosis not present

## 2020-09-29 DIAGNOSIS — M791 Myalgia, unspecified site: Secondary | ICD-10-CM | POA: Diagnosis present

## 2020-09-29 DIAGNOSIS — D631 Anemia in chronic kidney disease: Secondary | ICD-10-CM | POA: Diagnosis not present

## 2020-09-29 DIAGNOSIS — N2581 Secondary hyperparathyroidism of renal origin: Secondary | ICD-10-CM | POA: Diagnosis not present

## 2020-09-29 DIAGNOSIS — E875 Hyperkalemia: Secondary | ICD-10-CM | POA: Diagnosis not present

## 2020-09-29 LAB — COMPREHENSIVE METABOLIC PANEL
ALT: 13 U/L (ref 0–44)
AST: 24 U/L (ref 15–41)
Albumin: 2.9 g/dL — ABNORMAL LOW (ref 3.5–5.0)
Alkaline Phosphatase: 104 U/L (ref 38–126)
Anion gap: 12 (ref 5–15)
BUN: 23 mg/dL — ABNORMAL HIGH (ref 6–20)
CO2: 27 mmol/L (ref 22–32)
Calcium: 8.3 mg/dL — ABNORMAL LOW (ref 8.9–10.3)
Chloride: 95 mmol/L — ABNORMAL LOW (ref 98–111)
Creatinine, Ser: 7.03 mg/dL — ABNORMAL HIGH (ref 0.61–1.24)
GFR, Estimated: 9 mL/min — ABNORMAL LOW (ref >60)
Glucose, Bld: 85 mg/dL (ref 70–99)
Potassium: 3.7 mmol/L (ref 3.5–5.1)
Sodium: 134 mmol/L — ABNORMAL LOW (ref 135–145)
Total Bilirubin: 0.4 mg/dL (ref 0.3–1.2)
Total Protein: 8.7 g/dL — ABNORMAL HIGH (ref 6.5–8.1)

## 2020-09-29 LAB — RESP PANEL BY RT-PCR (FLU A&B, COVID) ARPGX2
Influenza A by PCR: NEGATIVE
Influenza B by PCR: NEGATIVE
SARS Coronavirus 2 by RT PCR: NEGATIVE

## 2020-09-29 LAB — CBC WITH DIFFERENTIAL/PLATELET
Abs Immature Granulocytes: 0.02 10*3/uL (ref 0.00–0.07)
Basophils Absolute: 0 10*3/uL (ref 0.0–0.1)
Basophils Relative: 1 %
Eosinophils Absolute: 0.4 10*3/uL (ref 0.0–0.5)
Eosinophils Relative: 11 %
HCT: 32.1 % — ABNORMAL LOW (ref 39.0–52.0)
Hemoglobin: 9.9 g/dL — ABNORMAL LOW (ref 13.0–17.0)
Immature Granulocytes: 1 %
Lymphocytes Relative: 18 %
Lymphs Abs: 0.7 10*3/uL (ref 0.7–4.0)
MCH: 25.9 pg — ABNORMAL LOW (ref 26.0–34.0)
MCHC: 30.8 g/dL (ref 30.0–36.0)
MCV: 84 fL (ref 80.0–100.0)
Monocytes Absolute: 0.4 10*3/uL (ref 0.1–1.0)
Monocytes Relative: 11 %
Neutro Abs: 2.3 10*3/uL (ref 1.7–7.7)
Neutrophils Relative %: 58 %
Platelets: 178 10*3/uL (ref 150–400)
RBC: 3.82 MIL/uL — ABNORMAL LOW (ref 4.22–5.81)
RDW: 20.1 % — ABNORMAL HIGH (ref 11.5–15.5)
WBC: 3.8 10*3/uL — ABNORMAL LOW (ref 4.0–10.5)
nRBC: 0 % (ref 0.0–0.2)

## 2020-09-29 MED ORDER — AZITHROMYCIN 250 MG PO TABS: ORAL_TABLET | ORAL | 0 refills | Status: DC

## 2020-09-29 NOTE — ED Provider Notes (Signed)
Pt with mild URI sx with mylagias, cough, sore throat and runny nose - after dialysis today came to get checked - sx are self classified as mild by patient - just wanted to get checked.  No fevcers, covid neg, lungs clear, no edema, well appearing  Medical screening examination/treatment/procedure(s) were conducted as a shared visit with non-physician practitioner(s) and myself.  I personally evaluated the patient during the encounter.  Clinical Impression:   Final diagnoses:  Community acquired pneumonia, unspecified laterality         Noemi Chapel, MD 09/30/20 [phone]

## 2020-09-29 NOTE — Discharge Instructions (Addendum)
You were negative for influenza and COVID-19.  You do have a possible early infiltrate on chest x-ray.  Please take the Z-Pak, as directed.  It does not need to be renally dosed.  Your laboratory work-up and physical exam was otherwise reassuring.  Please follow-up with your primary care provider regarding today's encounter and for ongoing evaluation and management.  Please return to the ED or seek immediate medical attention should you experience any new or worsening symptoms.

## 2020-09-29 NOTE — ED Triage Notes (Signed)
Pt c/o generalized body aches, runny nose, non-productive cough.  Denies elevated temp  Pt has been vaccinated

## 2020-09-29 NOTE — ED Provider Notes (Signed)
[facility] EMERGENCY DEPARTMENT Provider Note   CSN: [id number] Arrival date & time: 09/29/20  1447     History Chief Complaint  Patient presents with  . Generalized Body Aches    Marvin Roberson is a 49 y.o. male with PMH of HTN, HLD, CVA, ESRD on HD MWF, DVT, and adrenal insufficiency who presents the ED with complaints of rhinorrhea, nonproductive cough, and generalized body aches.  Patient reports that he is fully immunized for COVID-19.  On my examination, patient states that his symptoms began just a couple of days ago.  Patient reports that he had generalized aching that felt consistent with his prior COVID-19 infection and had some rhinorrhea and sinus congestion.  He also had a mild nonproductive cough and was concerned for possible pneumonia.  He states that he was not particularly concerned, however after discussing his symptoms with his primary care provider, he was advised to come to the ED for evaluation.  He feels as though his cough has been relatively well controlled with DayQuil and that his symptoms have overall been improving.   Patient denies any fevers or chills, chest pain except mild when coughing, pleuritic symptoms, abdominal pain, nausea or vomiting, urinary symptoms, or changes in bowels.  He is still making urine regularly.  He is in no acute distress on my exam.  HPI     Past Medical History:  Diagnosis Date  . Adrenal insufficiency ([place])   . Allergy   . Anxiety   . Depression   . Dyspnea    "when I have too much fluid"  . ESRD on dialysis [facility]) since 1990s   "M,W,F; [address]" (11/27/2016)  . GERD (gastroesophageal reflux disease)   . High cholesterol   . History of blood transfusion   . Hypertension   . Kidney failure   . Renal insufficiency   . Stroke [facility]) 2016   decreased vision in his left eye/notes 11/27/2016    Patient Active Problem List   Diagnosis Date Noted  . Syncope and collapse 09/19/2020  . Acute on chronic  combined systolic and diastolic CHF (congestive heart failure) ([place]) 09/19/2020  . Essential hypertension 09/19/2020  . Pneumonia due to COVID-19 virus 06/13/2020  . Hepatic cirrhosis ([place]) 03/09/2020  . Encounter for other preprocedural examination 03/09/2020  . Chronic nonintractable headache 01/28/2019  . Thrombocytopenia ([place]) 08/13/2018  . ESRD on dialysis ([place])   . ESRD (end stage renal disease) ([place]) 01/14/2018  . Hyperkalemia, diminished renal excretion 10/20/2017  . Post-operative complication 29/93/7169  . 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 ESRD on hemodialysis ([place])   . 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 Adrenal insufficiency ([place])   . Hypoglycemia due to insulin 11/27/2016  . Hypertension 11/27/2016  . History of CVA (cerebrovascular accident) 11/27/2016  . Monoclonal gammopathy of unknown significance (MGUS) 11/27/2016  . Bradycardia 02/26/2016  . Hypertensive cardiomyopathy ([place]) 01/22/2016  . Hypertensive kidney disease with ESRD (end-stage renal disease) ([place]) 01/22/2016  . Staphylococcus aureus bacteremia 01/22/2016  . Hydrocele 12/01/2015  . CAD (coronary artery disease) 07/24/2015  . 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 Anticoagulation goal of INR 2 to 3 07/05/2014  . Avascular necrosis of femoral head ([place]) 07/05/2014  . Intestinal occlusion ([place]) 07/05/2014  . Weakness of both hips 07/05/2014  . Anemia, chronic disease 03/18/2014  . Hyponatremia 03/18/2014  . Retroperitoneal lymphadenopathy 03/18/2014  . Weight loss 03/03/2014  . Sepsis ([place]) 03/30/2013  . Migraine  headache 10/11/2012  . Pulmonary embolism ([place]) 10/06/2012  . DVT of upper extremity (deep vein thrombosis) ([place]) 10/05/2012  . Cellulitis of left arm 10/04/2012    Past Surgical History:  Procedure Laterality Date  . ANGIOPLASTY Left 12/11/2016   Procedure: ANGIOPLASTY LEFT ARM & SUBCLAVIAN ARTERY;  Surgeon: Conrad Holtville, MD;  Location: [facility];  Service:  Vascular;  Laterality: Left;  . arm surgery Left 2016   "for aneurysm"  . AV FISTULA PLACEMENT    . FALSE ANEURYSM REPAIR Left 12/11/2016   Procedure: RESECTION  BRACHIAL ARTERY;  Surgeon: Conrad Geraldine, MD;  Location: [facility];  Service: Vascular;  Laterality: Left;  . FALSE ANEURYSM REPAIR Left 01/14/2018   Procedure: REPAIR FALSE ANEURYSM LEFT BRACHIAL ARTERY WITH SAPHENOUS VEIN  LEFT ARTERIOVENOUS GRAFT;  Surgeon: Rosetta Posner, MD;  Location: [facility];  Service: Vascular;  Laterality: Left;  . INSERTION OF DIALYSIS CATHETER Right    chest  . IR FLUORO GUIDE CV LINE RIGHT  11/17/2019  . THROMBECTOMY BRACHIAL ARTERY  12/11/2016   Procedure: THROMBECTOMY BRACHIAL ARTERY AND ULNAR;  Surgeon: Conrad Hana, MD;  Location: [facility];  Service: Vascular;;  . THYROIDECTOMY, PARTIAL    . UPPER EXTREMITY ANGIOGRAM Left 12/11/2016   Procedure: LEFT ARM ANGIOGRAM;  Surgeon: Conrad Glencoe, MD;  Location: [facility];  Service: Vascular;  Laterality: Left;  Marland Kitchen VEIN HARVEST Left 01/14/2018   Procedure: VEIN HARVEST LEFT SAPHENOUS VEIN;  Surgeon: Rosetta Posner, MD;  Location: [facility];  Service: Vascular;  Laterality: Left;  . WOUND EXPLORATION Left 12/11/2016   Procedure: LEFT ARM BRACHIAL ARTERY WITH INTERPOSTIONAL GRAFT;  Surgeon: Conrad Arco, MD;  Location[facility];  Service: Vascular;  Laterality: Left;       Family History  Problem Relation Age of Onset  . Hypertension Other   . Cancer Brother        in leg and lungs  . Heart attack Mother   . Adrenal disorder Neg Hx   . Colon cancer Neg Hx   . Rectal cancer Neg Hx   . Stomach cancer Neg Hx     Social History   Tobacco Use  . Smoking status: Current Every Day Smoker    Packs/day: 0.25    Years: 20.00    Pack years: 5.00    Types: Cigarettes  . Smokeless tobacco: Never Used  . Tobacco comment: 1 pk every 4 days.  Vaping Use  . Vaping Use: Never used  Substance Use Topics  . Alcohol use: Not Currently  . Drug use: No    Home Medications Prior to  Admission medications   Medication Sig Start Date End Date Taking? Authorizing Provider  acetaminophen (TYLENOL) 500 MG tablet Take 500 mg by mouth every 6 (six) hours as needed for mild pain.  01/03/20 01/01/21 Yes [provider]  amLODipine (NORVASC) 10 MG tablet Take 10 mg by mouth at bedtime.  01/07/19  Yes [provider]  atorvastatin (LIPITOR) 40 MG tablet Take 40 mg by mouth at bedtime.  01/27/16  Yes [provider]  calcium acetate (PHOSLO) 667 MG capsule Take 2,001 mg by mouth 3 (three) times daily with meals.  01/08/19  Yes [provider]  carvedilol (COREG) 25 MG tablet Take 25 mg by mouth 2 (two) times daily with a meal.   Yes [provider]  cetirizine (ZYRTEC) 10 MG tablet Take 10 mg by mouth daily as needed for allergies.  01/10/20  Yes [provider]  folic acid-vitamin b complex-vitamin c-selenium-zinc (DIALYVITE) 3 MG TABS tablet Take 1 tablet by mouth daily.   Yes [provider]  guaiFENesin-dextromethorphan (ROBITUSSIN DM) 100-10 MG/5ML syrup Take 10 mLs by mouth every 4 (four) hours as needed for cough. 06/16/20  Yes Mosetta Anis, MD  hydrALAZINE (APRESOLINE) 100 MG tablet Take 100 mg by mouth 3 (three) times daily.   Yes [provider]  hydrocortisone (CORTEF) 5 MG tablet Take 5 mg by mouth daily.    Yes [provider]  pantoprazole (PROTONIX) 40 MG tablet Take 40 mg by mouth daily. 11/22/18  Yes [provider]  azithromycin (ZITHROMAX Z-PAK) 250 MG tablet 500 mg PO Day 1 250 mg PO Day 2-5 09/29/20   Corena Herter, PA-C    Allergies    Doxycycline, Codeine, Gabapentin, Lisinopril, Povidone-iodine, and Betadine [povidone iodine]  Review of Systems   Review of Systems  All other systems reviewed and are negative.   Physical Exam Updated Vital Signs BP (!) 190/95   Pulse 82   Temp 98.7 F (37.1 C) (Oral)   Resp 15   Ht 5\' 8"  (1.727 m)   Wt 59 kg   SpO2 99%   BMI 19.77 kg/m    Physical Exam Vitals and nursing note reviewed. Exam conducted with a chaperone present.  Constitutional:      General: He is not in acute distress.    Appearance: Normal appearance. He is not ill-appearing or toxic-appearing.  HENT:     Head: Normocephalic and atraumatic.  Eyes:     General: No scleral icterus.    Conjunctiva/sclera: Conjunctivae normal.  Cardiovascular:     Rate and Rhythm: Normal rate and regular rhythm.     Pulses: Normal pulses.     Heart sounds: Normal heart sounds.  Pulmonary:     Effort: Pulmonary effort is normal. No respiratory distress.     Breath sounds: Normal breath sounds. No wheezing or rales.  Abdominal:     General: Abdomen is flat. There is no distension.     Palpations: Abdomen is soft.     Tenderness: There is no abdominal tenderness.  Musculoskeletal:     Right lower leg: No edema.     Left lower leg: No edema.  Skin:    General: Skin is dry.     Capillary Refill: Capillary refill takes less than 2 seconds.  Neurological:     Mental Status: He is alert and oriented to person, place, and time.     GCS: GCS eye subscore is 4. GCS verbal subscore is 5. GCS motor subscore is 6.  Psychiatric:        Mood and Affect: Mood normal.        Behavior: Behavior normal.        Thought Content: Thought content normal.     ED Results / Procedures / Treatments   Labs (all labs ordered are listed, but only abnormal results are displayed) Labs Reviewed  CBC WITH DIFFERENTIAL/PLATELET - Abnormal; Notable for the following components:      Result Value   WBC 3.8 (*)    RBC 3.82 (*)    Hemoglobin 9.9 (*)    HCT 32.1 (*)    MCH 25.9 (*)    RDW 20.1 (*)    All other components within normal limits  COMPREHENSIVE METABOLIC PANEL - Abnormal; Notable for the following components:   Sodium 134 (*)    Chloride 95 (*)  BUN 23 (*)    Creatinine, Ser 7.03 (*)    Calcium 8.3 (*)    Total Protein 8.7 (*)    Albumin 2.9 (*)    GFR, Estimated 9 (*)     All other components within normal limits  RESP PANEL BY RT-PCR (FLU A&B, COVID) [id number]    EKG None  Radiology DG Chest Portable 1 View  Result Date: 09/29/2020 CLINICAL DATA:  Cough and body aches. EXAM: PORTABLE CHEST 1 VIEW COMPARISON:  June 15, 2020 FINDINGS: There is stable right-sided venous catheter positioning. The heart size and mediastinal contours are within normal limits. A trace amount of atelectasis and/or early infiltrate is seen within the mid lung fields. A radiopaque vascular stent is seen overlying the left axilla with radiopaque surgical clips along the medial aspect of the left shoulder. The visualized skeletal structures are unremarkable. IMPRESSION: Trace amount of bilateral mid lung atelectasis and/or early infiltrate. Electronically Signed   By: Virgina Norfolk M.D.   On: 09/29/2020 20:14    Procedures Procedures (including critical care time)  Medications Ordered in ED Medications - No data to display  ED Course  I have reviewed the triage vital signs and the nursing notes.  Pertinent labs & imaging results that were available during my care of the patient were reviewed by me and considered in my medical decision making (see chart for details).    MDM Rules/Calculators/A&P                          Patient's exam was underwhelming and he appears to be well.  He states that he was debating whether not to come in, but given his comorbidities and cough symptoms, wanted to at least assess for pneumonia.  He denies any fevers, chills, pleuritic symptoms, chest pain or shortness of breath, or other symptoms.  Will obtain respiratory panel by PCR, basic lab work-up, and chest x-ray.  Labs CBC with differential: Leukopenia to 3.8 and normocytic anemia with hemoglobin of 9.9, both of which consistent with baseline. CMP: End-stage renal disease, consistent with baseline. Respiratory panel PCR: Negative.  Plain films of chest personally reviewed and  demonstrate trace amount of bilateral midlung atelectasis and/or early infiltrate.  Given patient's significant comorbidities in the context of body aches and cough, would like to treat for possibly early pneumonia.  Will prescribe Z-Pak given doxycyline allergy.  Does not need to be renally dosed.  Patient can follow-up with his primary care provider for ongoing evaluation and management.  Strict ED return precautions discussed.  Patient voices understanding and is agreeable to the plan.  Final Clinical Impression(s) / ED Diagnoses Final diagnoses:  Community acquired pneumonia, unspecified laterality    Rx / DC Orders ED Discharge Orders         Ordered    azithromycin (ZITHROMAX Z-PAK) 250 MG tablet        09/29/20 2146           Corena Herter, PA-C 09/29/20 2146    Noemi Chapel, MD 09/30/20 [phone]

## 2020-10-02 ENCOUNTER — Telehealth: Payer: Self-pay | Admitting: Internal Medicine

## 2020-10-02 DIAGNOSIS — D631 Anemia in chronic kidney disease: Secondary | ICD-10-CM | POA: Diagnosis not present

## 2020-10-02 DIAGNOSIS — E875 Hyperkalemia: Secondary | ICD-10-CM | POA: Diagnosis not present

## 2020-10-02 DIAGNOSIS — Z992 Dependence on renal dialysis: Secondary | ICD-10-CM | POA: Diagnosis not present

## 2020-10-02 DIAGNOSIS — N186 End stage renal disease: Secondary | ICD-10-CM | POA: Diagnosis not present

## 2020-10-02 DIAGNOSIS — D509 Iron deficiency anemia, unspecified: Secondary | ICD-10-CM | POA: Diagnosis not present

## 2020-10-02 DIAGNOSIS — N2581 Secondary hyperparathyroidism of renal origin: Secondary | ICD-10-CM | POA: Diagnosis not present

## 2020-10-02 NOTE — Telephone Encounter (Signed)
Called patient to determine if monitor has been received or applied. Patient states he received the monitor 2 days ago and did not put it on because of the upcoming surgery he has on 12/6; states surgery is a hernia repair. He is currently at [facility] and says the nurse called to ask about wearing the monitor. Advised patient that he will need to wait and apply monitor after surgery. Patient verbalized agreement and states he will call back to let us know if anything changes with the date of his surgery. He thanked me for the call.

## 2020-10-02 NOTE — Telephone Encounter (Signed)
New Message   Webb Silversmith from [facility] is calling and says the pt has a procedure scheduled for 12/06. She is wondering if the patient should start wearing the monitor before or after his procedure  Please call

## 2020-10-04 DIAGNOSIS — D631 Anemia in chronic kidney disease: Secondary | ICD-10-CM | POA: Diagnosis not present

## 2020-10-04 DIAGNOSIS — D509 Iron deficiency anemia, unspecified: Secondary | ICD-10-CM | POA: Diagnosis not present

## 2020-10-04 DIAGNOSIS — N186 End stage renal disease: Secondary | ICD-10-CM | POA: Diagnosis not present

## 2020-10-04 DIAGNOSIS — E875 Hyperkalemia: Secondary | ICD-10-CM | POA: Diagnosis not present

## 2020-10-04 DIAGNOSIS — R6883 Chills (without fever): Secondary | ICD-10-CM | POA: Diagnosis not present

## 2020-10-04 DIAGNOSIS — N2581 Secondary hyperparathyroidism of renal origin: Secondary | ICD-10-CM | POA: Diagnosis not present

## 2020-10-04 DIAGNOSIS — I129 Hypertensive chronic kidney disease with stage 1 through stage 4 chronic kidney disease, or unspecified chronic kidney disease: Secondary | ICD-10-CM | POA: Diagnosis not present

## 2020-10-04 DIAGNOSIS — Z992 Dependence on renal dialysis: Secondary | ICD-10-CM | POA: Diagnosis not present

## 2020-10-05 ENCOUNTER — Other Ambulatory Visit (HOSPITAL_COMMUNITY): Payer: Medicare Other

## 2020-10-06 DIAGNOSIS — D509 Iron deficiency anemia, unspecified: Secondary | ICD-10-CM | POA: Diagnosis not present

## 2020-10-06 DIAGNOSIS — E875 Hyperkalemia: Secondary | ICD-10-CM | POA: Diagnosis not present

## 2020-10-06 DIAGNOSIS — Z992 Dependence on renal dialysis: Secondary | ICD-10-CM | POA: Diagnosis not present

## 2020-10-06 DIAGNOSIS — N186 End stage renal disease: Secondary | ICD-10-CM | POA: Diagnosis not present

## 2020-10-06 DIAGNOSIS — D631 Anemia in chronic kidney disease: Secondary | ICD-10-CM | POA: Diagnosis not present

## 2020-10-06 DIAGNOSIS — N2581 Secondary hyperparathyroidism of renal origin: Secondary | ICD-10-CM | POA: Diagnosis not present

## 2020-10-09 ENCOUNTER — Ambulatory Visit (HOSPITAL_COMMUNITY): Admission: RE | Admit: 2020-10-09 | Payer: Medicare Other | Source: Home / Self Care | Admitting: General Surgery

## 2020-10-09 ENCOUNTER — Encounter (HOSPITAL_COMMUNITY): Admission: RE | Payer: Self-pay | Source: Home / Self Care

## 2020-10-09 DIAGNOSIS — R6883 Chills (without fever): Secondary | ICD-10-CM | POA: Insufficient documentation

## 2020-10-09 DIAGNOSIS — D509 Iron deficiency anemia, unspecified: Secondary | ICD-10-CM | POA: Diagnosis not present

## 2020-10-09 DIAGNOSIS — N186 End stage renal disease: Secondary | ICD-10-CM | POA: Diagnosis not present

## 2020-10-09 DIAGNOSIS — N2581 Secondary hyperparathyroidism of renal origin: Secondary | ICD-10-CM | POA: Diagnosis not present

## 2020-10-09 DIAGNOSIS — D631 Anemia in chronic kidney disease: Secondary | ICD-10-CM | POA: Diagnosis not present

## 2020-10-09 DIAGNOSIS — E875 Hyperkalemia: Secondary | ICD-10-CM | POA: Diagnosis not present

## 2020-10-09 DIAGNOSIS — Z992 Dependence on renal dialysis: Secondary | ICD-10-CM | POA: Diagnosis not present

## 2020-10-09 SURGERY — REPAIR, HERNIA, INGUINAL, LAPAROSCOPIC
Anesthesia: General | Laterality: Left

## 2020-10-10 ENCOUNTER — Ambulatory Visit (INDEPENDENT_AMBULATORY_CARE_PROVIDER_SITE_OTHER): Payer: Medicare Other

## 2020-10-10 DIAGNOSIS — R55 Syncope and collapse: Secondary | ICD-10-CM

## 2020-10-11 DIAGNOSIS — D631 Anemia in chronic kidney disease: Secondary | ICD-10-CM | POA: Diagnosis not present

## 2020-10-11 DIAGNOSIS — D509 Iron deficiency anemia, unspecified: Secondary | ICD-10-CM | POA: Diagnosis not present

## 2020-10-11 DIAGNOSIS — Z992 Dependence on renal dialysis: Secondary | ICD-10-CM | POA: Diagnosis not present

## 2020-10-11 DIAGNOSIS — N186 End stage renal disease: Secondary | ICD-10-CM | POA: Diagnosis not present

## 2020-10-11 DIAGNOSIS — E875 Hyperkalemia: Secondary | ICD-10-CM | POA: Diagnosis not present

## 2020-10-11 DIAGNOSIS — N2581 Secondary hyperparathyroidism of renal origin: Secondary | ICD-10-CM | POA: Diagnosis not present

## 2020-10-12 ENCOUNTER — Ambulatory Visit (HOSPITAL_COMMUNITY): Payer: Medicare Other | Attending: Cardiovascular Disease

## 2020-10-12 ENCOUNTER — Other Ambulatory Visit: Payer: Self-pay

## 2020-10-12 DIAGNOSIS — R55 Syncope and collapse: Secondary | ICD-10-CM | POA: Diagnosis not present

## 2020-10-12 DIAGNOSIS — I5043 Acute on chronic combined systolic (congestive) and diastolic (congestive) heart failure: Secondary | ICD-10-CM | POA: Diagnosis not present

## 2020-10-12 LAB — ECHOCARDIOGRAM COMPLETE
Area-P 1/2: 3.6 cm2
S' Lateral: 4.3 cm

## 2020-10-13 DIAGNOSIS — Z992 Dependence on renal dialysis: Secondary | ICD-10-CM | POA: Diagnosis not present

## 2020-10-13 DIAGNOSIS — D631 Anemia in chronic kidney disease: Secondary | ICD-10-CM | POA: Diagnosis not present

## 2020-10-13 DIAGNOSIS — E875 Hyperkalemia: Secondary | ICD-10-CM | POA: Diagnosis not present

## 2020-10-13 DIAGNOSIS — D509 Iron deficiency anemia, unspecified: Secondary | ICD-10-CM | POA: Diagnosis not present

## 2020-10-13 DIAGNOSIS — N186 End stage renal disease: Secondary | ICD-10-CM | POA: Diagnosis not present

## 2020-10-13 DIAGNOSIS — N2581 Secondary hyperparathyroidism of renal origin: Secondary | ICD-10-CM | POA: Diagnosis not present

## 2020-10-16 DIAGNOSIS — N2581 Secondary hyperparathyroidism of renal origin: Secondary | ICD-10-CM | POA: Diagnosis not present

## 2020-10-16 DIAGNOSIS — D509 Iron deficiency anemia, unspecified: Secondary | ICD-10-CM | POA: Diagnosis not present

## 2020-10-16 DIAGNOSIS — D631 Anemia in chronic kidney disease: Secondary | ICD-10-CM | POA: Diagnosis not present

## 2020-10-16 DIAGNOSIS — N186 End stage renal disease: Secondary | ICD-10-CM | POA: Diagnosis not present

## 2020-10-16 DIAGNOSIS — E875 Hyperkalemia: Secondary | ICD-10-CM | POA: Diagnosis not present

## 2020-10-16 DIAGNOSIS — Z992 Dependence on renal dialysis: Secondary | ICD-10-CM | POA: Diagnosis not present

## 2020-10-18 DIAGNOSIS — Z992 Dependence on renal dialysis: Secondary | ICD-10-CM | POA: Diagnosis not present

## 2020-10-18 DIAGNOSIS — D509 Iron deficiency anemia, unspecified: Secondary | ICD-10-CM | POA: Diagnosis not present

## 2020-10-18 DIAGNOSIS — D631 Anemia in chronic kidney disease: Secondary | ICD-10-CM | POA: Diagnosis not present

## 2020-10-18 DIAGNOSIS — N2581 Secondary hyperparathyroidism of renal origin: Secondary | ICD-10-CM | POA: Diagnosis not present

## 2020-10-18 DIAGNOSIS — N186 End stage renal disease: Secondary | ICD-10-CM | POA: Diagnosis not present

## 2020-10-18 DIAGNOSIS — E875 Hyperkalemia: Secondary | ICD-10-CM | POA: Diagnosis not present

## 2020-10-20 DIAGNOSIS — D631 Anemia in chronic kidney disease: Secondary | ICD-10-CM | POA: Diagnosis not present

## 2020-10-20 DIAGNOSIS — Z992 Dependence on renal dialysis: Secondary | ICD-10-CM | POA: Diagnosis not present

## 2020-10-20 DIAGNOSIS — D509 Iron deficiency anemia, unspecified: Secondary | ICD-10-CM | POA: Diagnosis not present

## 2020-10-20 DIAGNOSIS — N2581 Secondary hyperparathyroidism of renal origin: Secondary | ICD-10-CM | POA: Diagnosis not present

## 2020-10-20 DIAGNOSIS — E875 Hyperkalemia: Secondary | ICD-10-CM | POA: Diagnosis not present

## 2020-10-20 DIAGNOSIS — N186 End stage renal disease: Secondary | ICD-10-CM | POA: Diagnosis not present

## 2020-10-23 DIAGNOSIS — E875 Hyperkalemia: Secondary | ICD-10-CM | POA: Diagnosis not present

## 2020-10-23 DIAGNOSIS — N186 End stage renal disease: Secondary | ICD-10-CM | POA: Diagnosis not present

## 2020-10-23 DIAGNOSIS — D631 Anemia in chronic kidney disease: Secondary | ICD-10-CM | POA: Diagnosis not present

## 2020-10-23 DIAGNOSIS — Z992 Dependence on renal dialysis: Secondary | ICD-10-CM | POA: Diagnosis not present

## 2020-10-23 DIAGNOSIS — D509 Iron deficiency anemia, unspecified: Secondary | ICD-10-CM | POA: Diagnosis not present

## 2020-10-23 DIAGNOSIS — N2581 Secondary hyperparathyroidism of renal origin: Secondary | ICD-10-CM | POA: Diagnosis not present

## 2020-10-25 DIAGNOSIS — D631 Anemia in chronic kidney disease: Secondary | ICD-10-CM | POA: Diagnosis not present

## 2020-10-25 DIAGNOSIS — Z992 Dependence on renal dialysis: Secondary | ICD-10-CM | POA: Diagnosis not present

## 2020-10-25 DIAGNOSIS — N2581 Secondary hyperparathyroidism of renal origin: Secondary | ICD-10-CM | POA: Diagnosis not present

## 2020-10-25 DIAGNOSIS — E875 Hyperkalemia: Secondary | ICD-10-CM | POA: Diagnosis not present

## 2020-10-25 DIAGNOSIS — N186 End stage renal disease: Secondary | ICD-10-CM | POA: Diagnosis not present

## 2020-10-25 DIAGNOSIS — D509 Iron deficiency anemia, unspecified: Secondary | ICD-10-CM | POA: Diagnosis not present

## 2020-10-27 DIAGNOSIS — E875 Hyperkalemia: Secondary | ICD-10-CM | POA: Diagnosis not present

## 2020-10-27 DIAGNOSIS — Z992 Dependence on renal dialysis: Secondary | ICD-10-CM | POA: Diagnosis not present

## 2020-10-27 DIAGNOSIS — N2581 Secondary hyperparathyroidism of renal origin: Secondary | ICD-10-CM | POA: Diagnosis not present

## 2020-10-27 DIAGNOSIS — D631 Anemia in chronic kidney disease: Secondary | ICD-10-CM | POA: Diagnosis not present

## 2020-10-27 DIAGNOSIS — D509 Iron deficiency anemia, unspecified: Secondary | ICD-10-CM | POA: Diagnosis not present

## 2020-10-27 DIAGNOSIS — N186 End stage renal disease: Secondary | ICD-10-CM | POA: Diagnosis not present

## 2020-10-30 DIAGNOSIS — E875 Hyperkalemia: Secondary | ICD-10-CM | POA: Diagnosis not present

## 2020-10-30 DIAGNOSIS — Z992 Dependence on renal dialysis: Secondary | ICD-10-CM | POA: Diagnosis not present

## 2020-10-30 DIAGNOSIS — D509 Iron deficiency anemia, unspecified: Secondary | ICD-10-CM | POA: Diagnosis not present

## 2020-10-30 DIAGNOSIS — N2581 Secondary hyperparathyroidism of renal origin: Secondary | ICD-10-CM | POA: Diagnosis not present

## 2020-10-30 DIAGNOSIS — D631 Anemia in chronic kidney disease: Secondary | ICD-10-CM | POA: Diagnosis not present

## 2020-10-30 DIAGNOSIS — N186 End stage renal disease: Secondary | ICD-10-CM | POA: Diagnosis not present

## 2020-10-31 ENCOUNTER — Ambulatory Visit: Payer: Medicare Other | Admitting: Internal Medicine

## 2020-10-31 ENCOUNTER — Telehealth: Payer: Self-pay | Admitting: Cardiology

## 2020-10-31 NOTE — Telephone Encounter (Signed)
Received call from [employer]. Patient had episode of 9 beats of NSVT this morning at 6:30am central standard time. He was contacted and reported asymptomatic. Instructed to continue on current home medication regimen. Will continue with surveillance monitoring.

## 2020-11-01 DIAGNOSIS — D631 Anemia in chronic kidney disease: Secondary | ICD-10-CM | POA: Diagnosis not present

## 2020-11-01 DIAGNOSIS — N2581 Secondary hyperparathyroidism of renal origin: Secondary | ICD-10-CM | POA: Diagnosis not present

## 2020-11-01 DIAGNOSIS — E875 Hyperkalemia: Secondary | ICD-10-CM | POA: Diagnosis not present

## 2020-11-01 DIAGNOSIS — D509 Iron deficiency anemia, unspecified: Secondary | ICD-10-CM | POA: Diagnosis not present

## 2020-11-01 DIAGNOSIS — Z992 Dependence on renal dialysis: Secondary | ICD-10-CM | POA: Diagnosis not present

## 2020-11-01 DIAGNOSIS — N186 End stage renal disease: Secondary | ICD-10-CM | POA: Diagnosis not present

## 2020-11-03 DIAGNOSIS — E875 Hyperkalemia: Secondary | ICD-10-CM | POA: Diagnosis not present

## 2020-11-03 DIAGNOSIS — D631 Anemia in chronic kidney disease: Secondary | ICD-10-CM | POA: Diagnosis not present

## 2020-11-03 DIAGNOSIS — N186 End stage renal disease: Secondary | ICD-10-CM | POA: Diagnosis not present

## 2020-11-03 DIAGNOSIS — N2581 Secondary hyperparathyroidism of renal origin: Secondary | ICD-10-CM | POA: Diagnosis not present

## 2020-11-03 DIAGNOSIS — D509 Iron deficiency anemia, unspecified: Secondary | ICD-10-CM | POA: Diagnosis not present

## 2020-11-03 DIAGNOSIS — Z992 Dependence on renal dialysis: Secondary | ICD-10-CM | POA: Diagnosis not present

## 2020-11-04 DIAGNOSIS — I129 Hypertensive chronic kidney disease with stage 1 through stage 4 chronic kidney disease, or unspecified chronic kidney disease: Secondary | ICD-10-CM | POA: Diagnosis not present

## 2020-11-04 DIAGNOSIS — N186 End stage renal disease: Secondary | ICD-10-CM | POA: Diagnosis not present

## 2020-11-04 DIAGNOSIS — Z992 Dependence on renal dialysis: Secondary | ICD-10-CM | POA: Diagnosis not present

## 2020-11-06 DIAGNOSIS — Z992 Dependence on renal dialysis: Secondary | ICD-10-CM | POA: Diagnosis not present

## 2020-11-06 DIAGNOSIS — D509 Iron deficiency anemia, unspecified: Secondary | ICD-10-CM | POA: Diagnosis not present

## 2020-11-06 DIAGNOSIS — E875 Hyperkalemia: Secondary | ICD-10-CM | POA: Diagnosis not present

## 2020-11-06 DIAGNOSIS — D631 Anemia in chronic kidney disease: Secondary | ICD-10-CM | POA: Diagnosis not present

## 2020-11-06 DIAGNOSIS — N186 End stage renal disease: Secondary | ICD-10-CM | POA: Diagnosis not present

## 2020-11-06 DIAGNOSIS — N2581 Secondary hyperparathyroidism of renal origin: Secondary | ICD-10-CM | POA: Diagnosis not present

## 2020-11-16 ENCOUNTER — Other Ambulatory Visit: Payer: Self-pay

## 2020-11-16 ENCOUNTER — Other Ambulatory Visit: Payer: Self-pay | Admitting: Internal Medicine

## 2020-11-16 ENCOUNTER — Ambulatory Visit (INDEPENDENT_AMBULATORY_CARE_PROVIDER_SITE_OTHER): Payer: Medicare Other | Admitting: Internal Medicine

## 2020-11-16 ENCOUNTER — Encounter: Payer: Self-pay | Admitting: Internal Medicine

## 2020-11-16 VITALS — BP 199/120 | HR 77 | Ht 68.0 in | Wt 131.0 lb

## 2020-11-16 DIAGNOSIS — I5020 Unspecified systolic (congestive) heart failure: Secondary | ICD-10-CM

## 2020-11-16 DIAGNOSIS — N186 End stage renal disease: Secondary | ICD-10-CM | POA: Diagnosis not present

## 2020-11-16 DIAGNOSIS — Z01810 Encounter for preprocedural cardiovascular examination: Secondary | ICD-10-CM | POA: Diagnosis not present

## 2020-11-16 DIAGNOSIS — I712 Thoracic aortic aneurysm, without rupture, unspecified: Secondary | ICD-10-CM | POA: Insufficient documentation

## 2020-11-16 DIAGNOSIS — I10 Essential (primary) hypertension: Secondary | ICD-10-CM | POA: Diagnosis not present

## 2020-11-16 DIAGNOSIS — Z720 Tobacco use: Secondary | ICD-10-CM

## 2020-11-16 DIAGNOSIS — I361 Nonrheumatic tricuspid (valve) insufficiency: Secondary | ICD-10-CM | POA: Diagnosis not present

## 2020-11-16 HISTORY — DX: Unspecified systolic (congestive) heart failure: I50.20

## 2020-11-16 HISTORY — DX: Thoracic aortic aneurysm, without rupture, unspecified: I71.20

## 2020-11-16 MED ORDER — ISOSORBIDE DINITRATE 10 MG PO TABS: 10.0000 mg | ORAL_TABLET | Freq: Three times a day (TID) | ORAL | 6 refills | Status: DC

## 2020-11-16 NOTE — Patient Instructions (Addendum)
Medication Instructions:  Your physician has recommended you make the following change in your medication:  -- START Isosorbide 10 mg - Take 1 tablet (10 mg) by mouth three times daily -- RX SENT *If you need a refill on your cardiac medications before your next appointment, please call your pharmacy*  Testing/Procedures: Your physician has recommended Cardiac CT of the Aorta (CAT scanning).  CT is a noninvasive, special x-ray that produces cross-sectional images of the body using x-rays and a computer. CT scans help physicians diagnose and treat medical conditions. For some CT exams, a contrast material is used to enhance visibility in the area of the body being studied. CT scans provide greater clarity and reveal more details than regular x-ray exams.  Follow-Up: At [facility], you and your health needs are our priority.  As part of our continuing mission to provide you with exceptional heart care, we have created designated Provider Care Teams.  These Care Teams include your primary Cardiologist (physician) and Advanced Practice Providers (APPs -  Physician Assistants and Nurse Practitioners) who all work together to provide you with the care you need, when you need it.  We recommend signing up for the patient portal called "MyChart".  Sign up information is provided on this After Visit Summary.  MyChart is used to connect with patients for Virtual Visits (Telemedicine).  Patients are able to view lab/test results, encounter notes, upcoming appointments, etc.  Non-urgent messages can be sent to your provider as well.   To learn more about what you can do with MyChart, go to [email or web address].    Your next appointment:   Your physician recommends that you schedule a follow-up appointment in: 2 - 3 MONTHS with Dr. Gasper Sells.  The format for your next appointment:   In Person with Rudean Haskell, MD

## 2020-11-16 NOTE — Progress Notes (Signed)
Cardiology Office Note:    Date:  11/16/2020   ID:  Marvin Roberson, DOB 09-18-1971, MRN [id number]  PCP:  Sonia Side., FNP  [facility] Cardiologist:  No primary care provider on file.  [place] Electrophysiologist:  None   CC: Follow up and surgical evaluation LAPAROSCOPIC LEFT [facility] [facility]; Dr. Ralene Ok  History of Present Illness:    Marvin Roberson is a 50 y.o. male with a hx of HFrEF (2015 30-35%-> 45%), HTN, Prior DVT and PE of unclear etiology, Prior COVID PNA, ESRD (still makes urine), Prior CVA, Prior Staph Bacteremia without IE (10/2012 A-[facility] with negative TEE), Mild aortic dilation without evidence of aortitis through chart review and OSH records review, who presented for evaluation 09/09/20.  Since prior:  Prior records received with additional past history of severe tricuspid regurgitation.  Had echocardiogram with improvement in TR.  Had one run of NSVT on [employer] Monitor.  Patient notes that he is doing well.  Since last visit notes no symptoms with 9 beat run of NSVT changes.  Relevant interval testing or therapy include his heart monitor and echocardiogram.  There are no interval hospital/ED visit.    No chest pain or pressure .  No SOB/DOE and no PND/Orthopnea.  No weight gain or leg swelling.  No palpitations or syncope.  Had anxiety on the heart monitor (not triggered; does not correlate to NSVT on monitor.   Ambulatory blood pressure not done but is getting a BP cuff.   Past Medical History:  Diagnosis Date  . Adrenal insufficiency ([place])   . Allergy   . Anxiety   . Depression   . Dyspnea    "when I have too much fluid"  . ESRD on dialysis [facility]) since 1990s   "M,W,F; [address]" (11/27/2016)  . GERD (gastroesophageal reflux disease)   . High cholesterol   . History of blood transfusion   . Hypertension   . Kidney failure   . Renal insufficiency   . Stroke [facility]) 2016   decreased vision in his left  eye/notes 11/27/2016    Past Surgical History:  Procedure Laterality Date  . ANGIOPLASTY Left 12/11/2016   Procedure: ANGIOPLASTY LEFT ARM & SUBCLAVIAN ARTERY;  Surgeon: Conrad Toomsboro, MD;  Location: [place];  Service: Vascular;  Laterality: Left;  . arm surgery Left 2016   "for aneurysm"  . AV FISTULA PLACEMENT    . FALSE ANEURYSM REPAIR Left 12/11/2016   Procedure: RESECTION  BRACHIAL ARTERY;  Surgeon: Conrad Mableton, MD;  Location: [place];  Service: Vascular;  Laterality: Left;  . FALSE ANEURYSM REPAIR Left 01/14/2018   Procedure: REPAIR FALSE ANEURYSM LEFT BRACHIAL ARTERY WITH SAPHENOUS VEIN  LEFT ARTERIOVENOUS GRAFT;  Surgeon: Rosetta Posner, MD;  Location: [place];  Service: Vascular;  Laterality: Left;  . INSERTION OF DIALYSIS CATHETER Right    chest  . IR FLUORO GUIDE CV LINE RIGHT  11/17/2019  . THROMBECTOMY BRACHIAL ARTERY  12/11/2016   Procedure: THROMBECTOMY BRACHIAL ARTERY AND ULNAR;  Surgeon: Conrad Tamaha, MD;  Location: [place];  Service: Vascular;;  . THYROIDECTOMY, PARTIAL    . UPPER EXTREMITY ANGIOGRAM Left 12/11/2016   Procedure: LEFT ARM ANGIOGRAM;  Surgeon: Conrad , MD;  Location: [place];  Service: Vascular;  Laterality: Left;  Marland Kitchen VEIN HARVEST Left 01/14/2018   Procedure: VEIN HARVEST LEFT SAPHENOUS VEIN;  Surgeon: Rosetta Posner, MD;  Location: [place];  Service: Vascular;  Laterality:  Left;  . WOUND EXPLORATION Left 12/11/2016   Procedure: LEFT ARM BRACHIAL ARTERY WITH INTERPOSTIONAL GRAFT;  Surgeon: Conrad Leola, MD;  Location: [place];  Service: Vascular;  Laterality: Left;   Current Medications: Current Meds  Medication Sig  . acetaminophen (TYLENOL) 500 MG tablet Take 500 mg by mouth every 6 (six) hours as needed for mild pain.   Marland Kitchen amLODipine (NORVASC) 10 MG tablet Take 10 mg by mouth at bedtime.   Marland Kitchen atorvastatin (LIPITOR) 40 MG tablet Take 40 mg by mouth at bedtime.   . calcium acetate (PHOSLO) 667 MG capsule Take 2,001 mg by mouth 3 (three) times daily with meals.   . carvedilol  (COREG) 25 MG tablet Take 25 mg by mouth 2 (two) times daily with a meal.  . cetirizine (ZYRTEC) 10 MG tablet Take 10 mg by mouth daily as needed for allergies.   . folic acid-vitamin b complex-vitamin c-selenium-zinc (DIALYVITE) 3 MG TABS tablet Take 1 tablet by mouth daily.  Marland Kitchen guaiFENesin-dextromethorphan (ROBITUSSIN DM) 100-10 MG/5ML syrup Take 10 mLs by mouth every 4 (four) hours as needed for cough.  . hydrALAZINE (APRESOLINE) 100 MG tablet Take 100 mg by mouth 3 (three) times daily.  . hydrocortisone (CORTEF) 5 MG tablet Take 5 mg by mouth daily.   . pantoprazole (PROTONIX) 40 MG tablet Take 40 mg by mouth daily.    Allergies:   Doxycycline, Codeine, Gabapentin, Lisinopril, Povidone-iodine, and Betadine [povidone iodine]   Social History   Socioeconomic History  . Marital status: Single    Spouse name: Not on file  . Number of children: Not on file  . Years of education: Not on file  . Highest education level: Not on file  Occupational History  . Not on file  Tobacco Use  . Smoking status: Current Every Day Smoker    Packs/day: 0.25    Years: 20.00    Pack years: 5.00    Types: Cigarettes  . Smokeless tobacco: Never Used  . Tobacco comment: 1 pk every 4 days.  Vaping Use  . Vaping Use: Never used  Substance and Sexual Activity  . Alcohol use: Not Currently  . Drug use: No  . Sexual activity: Yes  Other Topics Concern  . Not on file  Social History Narrative  . Not on file   Social Determinants of Health   Financial Resource Strain: Not on file  Food Insecurity: Not on file  Transportation Needs: Not on file  Physical Activity: Not on file  Stress: Not on file  Social Connections: Not on file    Family History: [name] family history includes Cancer in his brother; Heart attack in his mother; Hypertension in an other family member. There is no history of Adrenal disorder, Colon cancer, Rectal cancer, or Stomach cancer. No   ROS:   Please see the history  of present illness.    All other systems reviewed and are negative.  EKGs/Labs/Other Studies Reviewed:    The following studies were reviewed today:  EKG:   08/07/20 SR 70 1st HB, LAFB, LVH with repol abnormality  Recent Labs: 09/29/2020: ALT 13; BUN 23; Creatinine, Ser 7.03; Hemoglobin 9.9; Platelets 178; Potassium 3.7; Sodium 134  Recent Lipid Panel No results found for: CHOL, TRIG, HDL, CHOLHDL, VLDL, LDLCALC, LDLDIRECT   Cardiac Event Monitoring: Date: 11/16/20 Results:   Patient had a minimum heart rate of 78 bpm, maximum heart rate of 53 bpm, and average heart rate of 98 bpm.  Predominant underlying rhythm  was sinus rhythm.  Three runs of ventricular tachycardia occurred lasting 9 beats at longest.  Isolated PACs were rare (<1.0%).  Isolated PVCs were rare (<1.0%).  No evidence of complete heart block, thought 1st HB is present.  No atrial fibrillation.  Triggered and diary events associated with sinus rhythm.   Short runs of NSVT.   Transthoracic Echocardiogram: Date: 10/12/2020 Results: Improvement in EF and TR; thick heart CKD vs Amyloid 1. Inferior and posterior lateral wall hypokinesis Low normal GLS -14.9  with apical sparing Although speckled myocardial pattern may be seen with  CRF strain pattern suggest possible need for amyloid w/u . Left  ventricular ejection fraction, by  estimation, is 45 to 50%. The left ventricle has mildly decreased  function. The left ventricle demonstrates regional wall motion  abnormalities (see scoring diagram/findings for description). The left  ventricular internal cavity size was moderately  dilated. There is moderate left ventricular hypertrophy. Left ventricular  diastolic parameters were normal.  2. Right ventricular systolic function is normal. The right ventricular  size is normal.  3. Left atrial size was moderately dilated.  4. ? catheter in RA.  5. The pericardial effusion is posterior to the left ventricle  and  lateral to the left ventricle.  6. The mitral valve is normal in structure. Mild mitral valve  regurgitation. No evidence of mitral stenosis.  7. Tricuspid valve regurgitation is moderate.  8. The aortic valve is tricuspid. Aortic valve regurgitation is not  visualized. Mild aortic valve sclerosis is present, with no evidence of  aortic valve stenosis.  9. Aortic dilatation noted. There is mild dilatation of the aortic root,  measuring 42 mm.  10. The inferior vena cava is normal in size with greater than 50%  respiratory variability, suggesting right atrial pressure of 3 mmHg.  NonCardiac CT: Date:01/13/2018 Results: - personally reviewed:  AA 37 mm; no coronary or aortic calcifications.  Physical Exam:    VS:  BP (!) 199/120   Pulse 77   Ht 5\' 8"  (1.727 m)   Wt 131 lb (59.4 kg)   SpO2 99%   BMI 19.92 kg/m     Wt Readings from Last 3 Encounters:  11/16/20 131 lb (59.4 kg)  09/29/20 130 lb (59 kg)  09/19/20 129 lb (58.5 kg)    GEN: Well nourished, well developed in no acute distress HEENT: Normal NECK: No JVD; No carotid bruits LYMPHATICS: No lymphadenopathy CARDIAC: RRR, no TR murmurs, no rubs, gallops RESPIRATORY:  Clear to auscultation without rales, wheezing or rhonchi  ABDOMEN: Soft, non-tender, non-distended MUSCULOSKELETAL:  No edema; No deformity  SKIN: Warm and dry NEUROLOGIC:  Alert and oriented x 3 PSYCHIATRIC:  Normal affect   ASSESSMENT:    1. Preop cardiovascular exam   2. HFrEF (heart failure with reduced ejection fraction) ([place])   3. Thoracic aortic aneurysm without rupture ([place])   4. Nonrheumatic tricuspid valve regurgitation   5. Primary hypertension   6. Tobacco abuse   7. ESRD (end stage renal disease) ([place])    PLAN:    In order of problems listed above:  Preoperative Risk Assessment - The Revised Cardiac Risk Index = 2 CHF, Scr >2), which equates to 2=6.6% moderate estimated risk of perioperative myocardial infarction, pulmonary  edema, ventricular fibrillation, cardiac arrest, or complete heart block; patient understands risk and is amenable to testing - DASI score of 19 associated with 5.07 functional mets - No further cardiac testing is recommended prior to surgery.  - The patient may  proceed to surgery at acceptable risk.   - Our service is available as needed in the peri-operative period.    Heart Failure Reduced Ejection Fraction EF 45% ESRD Moderate Tricuspid regurgitation HTN - NYHA class I, Stage B, euvolemic, etiology unclaer - Diuretic regimen: hold after  - Strict I/Os, daily weights, and fluid restriction of < 2 L  - Replace electrolytes PRN and keep K>4 and Mg>2. - Coreg 25 mg BID  - ARB/ACEi allergy - hydralazine 100mg  TID - Will trial isordil 10 mg TID - asymptomatic; getting CT scan below; will discuss NM Stress based on results  Asymptomatic thoracic aortic aneurysm Chart review history of aortitis not confirmed with review of old records - Last at,4.2 cm  rate of growth unclaer, indexed diameter 2.42 - Will need CT Angio Aorta - 1st degree relative one time screening - Discussed not using Fluoroquinolones -  no history of syphilis so deferring TPHA, micro-haemagglutination test, flourescent treponemal antibody absorption test and no 21 days of therapy with benzathine penicillin after surgery would be required; age < 64 and no HIV history unlesss thickened aorta  Tobacco Abuse- down to 4 a day - given ESRD defer Chatnix at this time - give QTc 511, will defer bupropion at this time - will try gum - discussed the dangers of tobacco use, both inhaled and oral, which include, but are not limited to cardiovascular disease, increased cancer risk of multiple types of cancer, COPD, peripheral arterial disease, strokes. - counseled on the benefits of smoking cessation. - firmly advised to quit.  - we also reviewed strategies to maximize success, including:  Removing cigarettes and smoking  materials from environment   Stress management  Substitution of other forms of reinforcement (the one cigarette a day approach)  Support of family/friends and group smoking cessation  Selecting a quit date  Patient provided contact information for [facility] or 1-800-QUIT-NOW  Patient provided with [place]'s 8 free smoking cessation classes: (336) (914)[phone] and CarWashShow.fr    2-3 month follow up unless new symptoms or abnormal test results warranting change in plan  Would be reasonable for  APP Follow up   Shared Decision Making/Informed Consent       Medication Adjustments/Labs and Tests Ordered: Current medicines are reviewed at length with the patient today.  Concerns regarding medicines are outlined above.  No orders of the defined types were placed in this encounter.  No orders of the defined types were placed in this encounter.   There are no Patient Instructions on file for this visit.   Signed, Werner Lean, MD  11/16/2020 10:48 AM    [place]

## 2020-11-20 ENCOUNTER — Other Ambulatory Visit (HOSPITAL_COMMUNITY): Payer: Medicare Other

## 2020-11-21 ENCOUNTER — Other Ambulatory Visit (HOSPITAL_COMMUNITY): Payer: Medicare Other

## 2020-11-21 ENCOUNTER — Encounter (HOSPITAL_COMMUNITY): Payer: Self-pay | Admitting: General Surgery

## 2020-11-21 NOTE — Anesthesia Preprocedure Evaluation (Addendum)
Anesthesia Evaluation  Patient identified by MRN, date of birth, ID band Patient awake    Reviewed: Allergy & Precautions, NPO status , Patient's Chart, lab work & pertinent test results  Airway Mallampati: III  TM Distance: >3 FB Neck ROM: Full    Dental no notable dental hx.    Pulmonary Current SmokerPatient did not abstain from smoking.,    Pulmonary exam normal breath sounds clear to auscultation       Cardiovascular hypertension, Pt. on medications and Pt. on home beta blockers + CAD and +CHF  Normal cardiovascular exam Rhythm:Regular Rate:Normal  ECG: SR, rate 77  ECHO: 1. Inferior and posterior lateral wall hypokinesis Low normal GLS -14.9 with apical sparing Although speckled myocardial pattern may be seen with CRF strain pattern suggest possible need for amyloid w/u . Left ventricular ejection fraction, by estimation, is 45 to 50%. The left ventricle has mildly decreased function. The left ventricle demonstrates regional wall motion abnormalities (see scoring diagram/findings for description). The left ventricular internal cavity size was moderately dilated. There is moderate left ventricular hypertrophy. Left ventricular diastolic parameters were normal. 2. Right ventricular systolic function is normal. The right ventricular size is normal. 3. Left atrial size was moderately dilated. 4. ? catheter in RA. 5. The pericardial effusion is posterior to the left ventricle and lateral to the left ventricle. 6. The mitral valve is normal in structure. Mild mitral valve regurgitation. No evidence of mitral stenosis. 7. Tricuspid valve regurgitation is moderate. 8. The aortic valve is tricuspid. Aortic valve regurgitation is not visualized. Mild aortic valve sclerosis is present, with no evidence of aortic valve stenosis. 9. Aortic dilatation noted. There is mild dilatation of the aortic root, measuring 42 mm. 10. The inferior  vena cava is normal in size with greater than 50% respiratory variability, suggesting right atrial pressure of 3 mmHg.   Neuro/Psych  Headaches, PSYCHIATRIC DISORDERS Anxiety Depression CVA (Left eye ), Residual Symptoms    GI/Hepatic Neg liver ROS, GERD  Medicated and Controlled,  Endo/Other  negative endocrine ROS  Renal/GU ESRF and DialysisRenal disease (HD on M, W, F)     Musculoskeletal negative musculoskeletal ROS (+)   Abdominal   Peds  Hematology HLD   Anesthesia Other Findings LEFT INGUINAL HERNIA  Reproductive/Obstetrics                           Anesthesia Physical Anesthesia Plan  ASA: III  Anesthesia Plan: General   Post-op Pain Management:    Induction: Intravenous  PONV Risk Score and Plan: 2 and Ondansetron, Dexamethasone, Treatment may vary due to age or medical condition and Midazolam  Airway Management Planned: Oral ETT  Additional Equipment:   Intra-op Plan:   Post-operative Plan: Extubation in OR  Informed Consent: I have reviewed the patients History and Physical, chart, labs and discussed the procedure including the risks, benefits and alternatives for the proposed anesthesia with the patient or authorized representative who has indicated his/her understanding and acceptance.     Dental advisory given  Plan Discussed with: CRNA  Anesthesia Plan Comments: (Reviewed PAT note written 11/21/2020 by Myra Gianotti, PA-C. )       Anesthesia Quick Evaluation

## 2020-11-21 NOTE — Progress Notes (Signed)
Spoke with pt for pre-op call. Pt very concerned about the possibility of snow on Thursday. I explained to him that we do not usually cancel surgeries due to the weather, but if he feels he can't make it here, he can call Dr. Rosendo Gros' office tomorrow and cancel or the day of surgery to call the Short Stay Unit here at [facility]. He was ok about going ahead and doing the pre-op call. He did request that I move his Covid test to tomorrow at 3 in case he decides to cancel the surgery because of the weather.  Pt has hx of CHF and sees Dr. Gasper Sells. Last office visit was 11/16/20 and patient has been cleared for surgery. He denies any recent chest pain or sob. Pt is renal dialysis pt, does dialysis on M/W/F. Pt is not diabetic.   Covid test rescheduled for tomorrow at 3 PM (11/22/20) and instructed pt to quarantine once the test is done and until he comes to the hospital on Thursday. He voiced understanding.  Pt was not aware that he had to have a responsible adult to drive him home and be with him for the 24 hours after surgery. After thinking a bit, he states he thinks that won't be a problem.   Chart sent to Anesthesia PA for review.

## 2020-11-21 NOTE — Progress Notes (Signed)
Anesthesia Chart Review:  Case: [id number] Date/Time: 11/23/20 1015   Procedure: LAPAROSCOPIC LEFT INGUINAL HERNIA REPAIR WITH MESH (Left )   Anesthesia type: General   Pre-op diagnosis: LEFT INGUINAL HERNIA   Location: [facility] ROOM 09 / [facility] [facility]   Surgeons: Ralene Ok, MD      DISCUSSION: Patient is a 50 year old male scheduled for the above procedure.  History includes smoking, HTN, HFrEF (45-50% 10/12/20), TR (moderate), CVA (with decreased left vision 03/2015), hypercholesterolemia, adrenal insufficiency, ESRD (history of multiple AVGG pseudoaneurysm repairs including left [place]-brachial artery bypass with PTFE 11/09/36 complicated by large blood loss and intraoperative nerve injury with transsection of medial cord versus ulnar nerve of brachial plexus with repairr with allograft by Plastic surgery 06/12/15 followed by nerve pedicle transfer with AIN to left ulnar nerve 07/19/15; s/p resection of left brachial pseudoaneurysm and replacement with GSV graft 01/14/18), dyspnea (when volume overloaded), anxiety, depression, MGUS, anemia (GI bleed likely due to gastric variceal bleed 07/24/14, s/p intubation and 5 units total  PRBC; had acute BiV CHF with EF 15-20% thought due to high cardiac output state from severe anemia as well as bilateral AVFs), COVID-19 pneumonia (required admission with BiPAP 06/12/20-06/16/20). Possible aortitis in 2017. Mild aortic root dilation on 10/12/20 echo (42 mm).   Preoperative cardiology evaluation outlined in 11/16/20 note by Dr. Gasper Sells: "Preoperative Risk Assessment - The Revised Cardiac Risk Index = 2 CHF, Scr >2), which equates to 2=6.6% moderate estimated risk of perioperative myocardial infarction, pulmonary edema, ventricular fibrillation, cardiac arrest, or complete heart block; patient understands risk and is amenable to testing - DASI score of 19 associated with 5.07 functional mets - No further cardiac testing is recommended prior to surgery.  - The patient may  proceed to surgery at acceptable risk.   - Our service is available as needed in the peri-operative period." BP was 199/120 at that visit. It appears that isosorbide dinitrate was added at 10 mg TID. Patient also to get a home BP cuff. BP meds include amlodipine 10 mg Q HS, Coreg 25 mg BID, hydralazine 100 mg TID, Isordil 10 mg TID.   Preoperative COVID-19 test is scheduled for 11/22/20. He is a same day work-up, so he will get labs, vitals, and anesthesia team evaluation on the day of surgery. He is on hydrocortisone 5 mg daily.    VS:  BP Readings from Last 3 Encounters:  11/16/20 (!) 199/120  09/29/20 (!) 167/101  09/19/20 (!) 140/94   Pulse Readings from Last 3 Encounters:  11/16/20 77  09/29/20 79  09/19/20 74    PROVIDERS: Sonia Side., FNP is listed as PCP Rudean Haskell, MD is cardiologist   LABS: For day of surgery. Comparison labs available include: Lab Results  Component Value Date   WBC 3.8 (L) 09/29/2020   HGB 9.9 (L) 09/29/2020   HCT 32.1 (L) 09/29/2020   PLT 178 09/29/2020   GLUCOSE 85 09/29/2020   ALT 13 09/29/2020   AST 24 09/29/2020   NA 134 (L) 09/29/2020   K 3.7 09/29/2020   CL 95 (L) 09/29/2020   CREATININE 7.03 (H) 09/29/2020   BUN 23 (H) 09/29/2020   CO2 27 09/29/2020   INR 1.1 (H) 03/09/2020     IMAGES: 1V PCXR 09/29/20: FINDINGS: There is stable right-sided venous catheter positioning. The heart size and mediastinal contours are within normal limits. A trace amount of atelectasis and/or early infiltrate is seen within the mid lung fields. A radiopaque vascular stent  is seen overlying the left axilla with radiopaque surgical clips along the medial aspect of the left shoulder. The visualized skeletal structures are unremarkable. IMPRESSION: Trace amount of bilateral mid lung atelectasis and/or early infiltrate.  CTA chest 01/13/18: IMPRESSION: - Intact patent thoracic aorta with 3 vessel arch anatomy. No central aortic arch  occlusion, dissection or acute vascular process. - Negative for significant acute central pulmonary embolus. - No acute intrathoracic finding - Patent left axillary brachial artery bypass graft with a suspected moderate to severe stenosis of the proximal axillary anastomosis and a distal bypass graft pseudoaneurysm measuring up to 19 x 10 mm. Peripherally, the proximal portions of the left interosseous and ulnar arteries are patent. Left radial artery is chronically occluded. - Improvement in the left upper extremity soft tissue edema. - Per Dr. Gasper Sells "personally reviewed:  AA 37 mm; no coronary or aortic calcifications."   EKG: 11/16/20 ([facility]): SR. First degree AV block. LVH with repolarization abnormality. Prolonged QT (QT 452, QTc 511 ms).    CV: Cardiac Event Monitoring: Date: 10/10/20-11/08/20 Results:   Patient had a minimum heart rate of 78 bpm, maximum heart rate of 53 bpm, and average heart rate of 98 bpm.  Predominant underlying rhythm was sinus rhythm.  Three runs of ventricular tachycardia occurred lasting 9 beats at longest.  Isolated PACs were rare (<1.0%).  Isolated PVCs were rare (<1.0%).  No evidence of complete heart block, thought 1st HB is present.  No atrial fibrillation.  Triggered and diary events associated with sinus rhythm. Short runs of NSVT.   Transthoracic Echocardiogram: Date: 10/12/2020 Results: Improvement in EF and TR [when compared to 08/01/14 echo]; thick heart CKD vs Amyloid 1. Inferior and posterior lateral wall hypokinesis Low normal GLS -14.9  with apical sparing Although speckled myocardial pattern may be seen with  CRF strain pattern suggest possible need for amyloid w/u . Left  ventricular ejection fraction, by  estimation, is 45 to 50%. The left ventricle has mildly decreased  function. The left ventricle demonstrates regional wall motion  abnormalities (see scoring diagram/findings for description). The left   ventricular internal cavity size was moderately  dilated. There is moderate left ventricular hypertrophy. Left ventricular  diastolic parameters were normal.  2. Right ventricular systolic function is normal. The right ventricular  size is normal.  3. Left atrial size was moderately dilated.  4. ? catheter in RA.  5. The pericardial effusion is posterior to the left ventricle and  lateral to the left ventricle.  6. The mitral valve is normal in structure. Mild mitral valve  regurgitation. No evidence of mitral stenosis.  7. Tricuspid valve regurgitation is moderate.  8. The aortic valve is tricuspid. Aortic valve regurgitation is not  visualized. Mild aortic valve sclerosis is present, with no evidence of  aortic valve stenosis.  9. Aortic dilatation noted. There is mild dilatation of the aortic root,  measuring 42 mm.  10. The inferior vena cava is normal in size with greater than 50%  respiratory variability, suggesting right atrial pressure of 3 mmHg. - (Comparison 01/24/16 [facility] CE EF 60-65%, mild-mod LV septal thickness, concentric LVH, LA/RA size normal, Normal RVSF, trivial MR, mild TR; 08/18/14 [facility] CE EF 30-35%, normal RVF, LA mod dilated, RA mild-mod dilated, mild MR, mild-moderate TR, small pericardial effusion; 08/01/14 [facility] CE EF 15-20%, LV mildly dilated, RV mild-mod dilated, RVSF moderately reduced, LA/RA mod dilated, severe TR, mild aortic root dilatation, small pericardial effusion in setting of acute GI bleed with acute drop  in EF felt likely due to high cardiac output state from severe anemia as well as bilateral AVFs)    Past Medical History:  Diagnosis Date  . Adrenal insufficiency ([place])   . Allergy   . Anemia   . Anxiety   . Depression   . Dyspnea    "when I have too much fluid"  . ESRD on dialysis [facility]) since 1990s   "M,W,F; [address]" (11/27/2016)  . GERD (gastroesophageal reflux disease)   . Heart failure with reduced ejection fraction ([place])    . High cholesterol   . History of blood transfusion   . Hypertension   . Kidney failure   . Renal insufficiency   . Stroke ([place]) 2016   decreased vision in his left eye/notes 11/27/2016  . Tricuspid regurgitation     Past Surgical History:  Procedure Laterality Date  . ANGIOPLASTY Left 12/11/2016   Procedure: ANGIOPLASTY LEFT ARM & SUBCLAVIAN ARTERY;  Surgeon: Conrad Stockton, MD;  Location: [facility];  Service: Vascular;  Laterality: Left;  . arm surgery Left 2016   "for aneurysm"  . AV FISTULA PLACEMENT    . FALSE ANEURYSM REPAIR Left 12/11/2016   Procedure: RESECTION  BRACHIAL ARTERY;  Surgeon: Conrad Dante, MD;  Location: [facility];  Service: Vascular;  Laterality: Left;  . FALSE ANEURYSM REPAIR Left 01/14/2018   Procedure: REPAIR FALSE ANEURYSM LEFT BRACHIAL ARTERY WITH SAPHENOUS VEIN  LEFT ARTERIOVENOUS GRAFT;  Surgeon: Rosetta Posner, MD;  Location: [place];  Service: Vascular;  Laterality: Left;  . INSERTION OF DIALYSIS CATHETER Right    chest  . IR FLUORO GUIDE CV LINE RIGHT  11/17/2019  . THROMBECTOMY BRACHIAL ARTERY  12/11/2016   Procedure: THROMBECTOMY BRACHIAL ARTERY AND ULNAR;  Surgeon: Conrad Riggins, MD;  Location: [place];  Service: Vascular;;  . THYROIDECTOMY, PARTIAL    . UPPER EXTREMITY ANGIOGRAM Left 12/11/2016   Procedure: LEFT ARM ANGIOGRAM;  Surgeon: Conrad Dennis Acres, MD;  Location: [facility];  Service: Vascular;  Laterality: Left;  Marland Kitchen VEIN HARVEST Left 01/14/2018   Procedure: VEIN HARVEST LEFT SAPHENOUS VEIN;  Surgeon: Rosetta Posner, MD;  Location: [facility];  Service: Vascular;  Laterality: Left;  . WOUND EXPLORATION Left 12/11/2016   Procedure: LEFT ARM BRACHIAL ARTERY WITH INTERPOSTIONAL GRAFT;  Surgeon: Conrad Crawford, MD;  Location: [facility];  Service: Vascular;  Laterality: Left;    MEDICATIONS: No current facility-administered medications for this encounter.   Marland Kitchen acetaminophen (TYLENOL) 500 MG tablet  . amLODipine (NORVASC) 10 MG tablet  . atorvastatin (LIPITOR) 40 MG tablet  . calcium  acetate (PHOSLO) 667 MG capsule  . carvedilol (COREG) 25 MG tablet  . guaiFENesin-dextromethorphan (ROBITUSSIN DM) 100-10 MG/5ML syrup  . hydrALAZINE (APRESOLINE) 100 MG tablet  . hydrocortisone (CORTEF) 5 MG tablet  . isosorbide dinitrate (ISORDIL) 10 MG tablet    Myra Gianotti, PA-C Surgical Short Stay/Anesthesiology [facility] Phone [phone] [facility] Phone [phone] 11/21/2020 11:23 AM

## 2020-11-22 ENCOUNTER — Other Ambulatory Visit (HOSPITAL_COMMUNITY)
Admission: RE | Admit: 2020-11-22 | Discharge: 2020-11-22 | Disposition: A | Discharge status: Home / Self Care | Payer: Medicare Other | Source: Ambulatory Visit | Attending: General Surgery | Admitting: General Surgery

## 2020-11-22 ENCOUNTER — Ambulatory Visit: Payer: Self-pay | Admitting: General Surgery

## 2020-11-22 DIAGNOSIS — Z01812 Encounter for preprocedural laboratory examination: Secondary | ICD-10-CM | POA: Diagnosis not present

## 2020-11-22 DIAGNOSIS — Z20822 Contact with and (suspected) exposure to covid-19: Secondary | ICD-10-CM | POA: Diagnosis not present

## 2020-11-22 LAB — SARS CORONAVIRUS 2 (TAT 6-24 HRS): SARS Coronavirus 2: NEGATIVE

## 2020-11-22 NOTE — H&P (Signed)
History of Present Illness  The patient is a 50 year old male who presents with an inguinal hernia. Chief Complaint: Left inguinal hernia  Patient is a 50 year old male is referred by Dr. Madelon Lips, he has a history for end-stage renal disease on dialysis Monday Wednesday Friday, with a left inguinal hernia. Patient states this is been there for the last several months. He states that the bulge is evident when he stands up. He states able to reduce when he lays down. He states he has no pain to the area.  Patient states he's had an incision for a tumor, cyst cyst removal in that area. He states this was several years ago. He is unsure of the procedure and or pathology.  Patient undergone previous right inguinal incision as well as likely appendectomy the patient is unsure.    BP (!) 174/80   Pulse 69   Temp (!) 97.2 F (36.2 C) (Oral)   Resp 18   Ht 5\' 8"  (1.727 m)   Wt 59 kg   SpO2 (!) 70%   BMI 19.77 kg/m    Past Surgical History  Dialysis Shunt / Fistula  Thyroid Surgery   Allergies  Lisinopril *ANTIHYPERTENSIVES*  Codeine Phosphate *ANALGESICS - OPIOID*  Gabapentin *ANTICONVULSANTS*  Povidone-Iodine *ANTISEPTICS & DISINFECTANTS*  Betadine *ANTISEPTICS & DISINFECTANTS*  Allergies Reconciled   Medication History amLODIPine Besylate (10MG  Tablet, Oral) Active. Coreg (25MG  Tablet, Oral) Active. ZyrTEC Allergy (10MG  Tablet, Oral) Active. hydrALAZINE HCl (100MG  Tablet, Oral) Active. Singulair (10MG  Tablet, Oral) Active. Medications Reconciled  Other Problems (Chanel Teressa Senter, CMA; 08/03/2020 4:02 PM) Anxiety Disorder  Gastric Ulcer  Gastroesophageal Reflux Disease  High blood pressure     Review of Systems Skin Not Present- Change in Wart/Mole, Dryness, Hives, Jaundice, New Lesions, Non-Healing Wounds, Rash and Ulcer. HEENT Present- Wears glasses/contact lenses. Not Present- Earache, Hearing Loss, Hoarseness, Nose Bleed,  Oral Ulcers, Ringing in the Ears, Seasonal Allergies, Sinus Pain, Sore Throat, Visual Disturbances and Yellow Eyes. Cardiovascular Not Present- Chest Pain, Difficulty Breathing Lying Down, Leg Cramps, Palpitations, Rapid Heart Rate, Shortness of Breath and Swelling of Extremities. Gastrointestinal Present- Bloating and Constipation. Not Present- Abdominal Pain, Bloody Stool, Change in Bowel Habits, Chronic diarrhea, Difficulty Swallowing, Excessive gas, Gets full quickly at meals, Hemorrhoids, Indigestion, Nausea, Rectal Pain and Vomiting. Male Genitourinary Not Present- Blood in Urine, Change in Urinary Stream, Frequency, Impotence, Nocturia, Painful Urination, Urgency and Urine Leakage. Neurological Present- Headaches. Not Present- Decreased Memory, Fainting, Numbness, Seizures, Tingling, Tremor, Trouble walking and Weakness. Psychiatric Present- Anxiety, Change in Sleep Pattern and Depression. Not Present- Bipolar, Fearful and Frequent crying. Endocrine Present- Cold Intolerance. Not Present- Excessive Hunger, Hair Changes, Heat Intolerance, Hot flashes and New Diabetes. Hematology Not Present- Blood Thinners, Easy Bruising, Excessive bleeding, Gland problems, HIV and Persistent Infections. All other systems negative      Physical Exam  The physical exam findings are as follows: Note: Constitutional: No acute distress, conversant, appears stated age  Eyes: Anicteric sclerae, moist conjunctiva, no lid lag  Neck: No thyromegaly, trachea midline, no cervical lymphadenopathy  Lungs: Clear to auscultation biilaterally, normal respiratory effot  Cardiovascular: regular rate & rhythm, no murmurs, no peripheal edema, pedal pulses 2+  GI: Soft, no masses or hepatosplenomegaly, non-tender to palpation  MSK: Normal gait, no clubbing cyanosis, edema  Skin: No rashes, palpation reveals normal skin turgor  Psychiatric: Appropriate judgment and insight, oriented to person, place,  and time  Abdomen Inspection Hernias - Inguinal hernia - Left - Reducible - Left (,  Likely left inc versus direct) .    Assessment & Plan LEFT INGUINAL HERNIA (K40.90) Impression: 50 year old male with a history of end-stage renal disease on dialysis, with a left inguinal hernia  1. The patient will like to proceed to the operating room for laparoscopic left inguinal hernia repair with mesh.  2. I discussed with the patient the signs and symptoms of incarceration and strangulation and the need to proceed to the ER should they occur.  3. I discussed with the patient the risks and benefits of the procedure to include but not limited to: Infection, bleeding, damage to surrounding structures, possible need for further surgery, possible nerve pain, and possible recurrence. The patient was understanding and wishes to proceed.

## 2020-11-23 ENCOUNTER — Ambulatory Visit (HOSPITAL_COMMUNITY)
Admission: RE | Admit: 2020-11-23 | Discharge: 2020-11-23 | Disposition: A | Discharge status: Home / Self Care | Payer: Medicare Other | Attending: General Surgery | Admitting: General Surgery

## 2020-11-23 ENCOUNTER — Ambulatory Visit (HOSPITAL_COMMUNITY): Payer: Medicare Other | Admitting: Vascular Surgery

## 2020-11-23 ENCOUNTER — Other Ambulatory Visit: Payer: Self-pay

## 2020-11-23 ENCOUNTER — Encounter (HOSPITAL_COMMUNITY): Payer: Self-pay | Admitting: General Surgery

## 2020-11-23 ENCOUNTER — Encounter (HOSPITAL_COMMUNITY)
Admission: RE | Disposition: A | Discharge status: Home / Self Care | Payer: Self-pay | Source: Home / Self Care | Attending: General Surgery

## 2020-11-23 DIAGNOSIS — Z888 Allergy status to other drugs, medicaments and biological substances status: Secondary | ICD-10-CM | POA: Insufficient documentation

## 2020-11-23 DIAGNOSIS — Z885 Allergy status to narcotic agent status: Secondary | ICD-10-CM | POA: Diagnosis not present

## 2020-11-23 DIAGNOSIS — K4090 Unilateral inguinal hernia, without obstruction or gangrene, not specified as recurrent: Secondary | ICD-10-CM | POA: Insufficient documentation

## 2020-11-23 DIAGNOSIS — I5043 Acute on chronic combined systolic (congestive) and diastolic (congestive) heart failure: Secondary | ICD-10-CM | POA: Diagnosis not present

## 2020-11-23 DIAGNOSIS — N186 End stage renal disease: Secondary | ICD-10-CM | POA: Insufficient documentation

## 2020-11-23 DIAGNOSIS — E2740 Unspecified adrenocortical insufficiency: Secondary | ICD-10-CM | POA: Insufficient documentation

## 2020-11-23 DIAGNOSIS — I120 Hypertensive chronic kidney disease with stage 5 chronic kidney disease or end stage renal disease: Secondary | ICD-10-CM | POA: Diagnosis not present

## 2020-11-23 DIAGNOSIS — D631 Anemia in chronic kidney disease: Secondary | ICD-10-CM | POA: Diagnosis not present

## 2020-11-23 DIAGNOSIS — D176 Benign lipomatous neoplasm of spermatic cord: Secondary | ICD-10-CM | POA: Insufficient documentation

## 2020-11-23 DIAGNOSIS — I132 Hypertensive heart and chronic kidney disease with heart failure and with stage 5 chronic kidney disease, or end stage renal disease: Secondary | ICD-10-CM | POA: Diagnosis not present

## 2020-11-23 DIAGNOSIS — Z992 Dependence on renal dialysis: Secondary | ICD-10-CM | POA: Diagnosis not present

## 2020-11-23 DIAGNOSIS — Z886 Allergy status to analgesic agent status: Secondary | ICD-10-CM | POA: Diagnosis not present

## 2020-11-23 HISTORY — DX: Anemia, unspecified: D64.9

## 2020-11-23 HISTORY — PX: INGUINAL HERNIA REPAIR: SHX194

## 2020-11-23 HISTORY — DX: Rheumatic tricuspid insufficiency: I07.1

## 2020-11-23 HISTORY — DX: Unspecified systolic (congestive) heart failure: I50.20

## 2020-11-23 LAB — POCT I-STAT, CHEM 8
BUN: 25 mg/dL — ABNORMAL HIGH (ref 6–20)
Calcium, Ion: 1.14 mmol/L — ABNORMAL LOW (ref 1.15–1.40)
Chloride: 98 mmol/L (ref 98–111)
Creatinine, Ser: 7 mg/dL — ABNORMAL HIGH (ref 0.61–1.24)
Glucose, Bld: 78 mg/dL (ref 70–99)
HCT: 28 % — ABNORMAL LOW (ref 39.0–52.0)
Hemoglobin: 9.5 g/dL — ABNORMAL LOW (ref 13.0–17.0)
Potassium: 3.9 mmol/L (ref 3.5–5.1)
Sodium: 141 mmol/L (ref 135–145)
TCO2: 28 mmol/L (ref 22–32)

## 2020-11-23 LAB — CBC
HCT: 27.4 % — ABNORMAL LOW (ref 39.0–52.0)
Hemoglobin: 8.4 g/dL — ABNORMAL LOW (ref 13.0–17.0)
MCH: 27.4 pg (ref 26.0–34.0)
MCHC: 30.7 g/dL (ref 30.0–36.0)
MCV: 89.3 fL (ref 80.0–100.0)
Platelets: 147 10*3/uL — ABNORMAL LOW (ref 150–400)
RBC: 3.07 MIL/uL — ABNORMAL LOW (ref 4.22–5.81)
RDW: 23.3 % — ABNORMAL HIGH (ref 11.5–15.5)
WBC: 4.7 10*3/uL (ref 4.0–10.5)
nRBC: 0 % (ref 0.0–0.2)

## 2020-11-23 LAB — BASIC METABOLIC PANEL
Anion gap: 10 (ref 5–15)
BUN: 23 mg/dL — ABNORMAL HIGH (ref 6–20)
CO2: 28 mmol/L (ref 22–32)
Calcium: 8.3 mg/dL — ABNORMAL LOW (ref 8.9–10.3)
Chloride: 98 mmol/L (ref 98–111)
Creatinine, Ser: 6.89 mg/dL — ABNORMAL HIGH (ref 0.61–1.24)
GFR, Estimated: 9 mL/min — ABNORMAL LOW (ref >60)
Glucose, Bld: 79 mg/dL (ref 70–99)
Potassium: 3.6 mmol/L (ref 3.5–5.1)
Sodium: 136 mmol/L (ref 135–145)

## 2020-11-23 SURGERY — REPAIR, HERNIA, INGUINAL, LAPAROSCOPIC
Anesthesia: General | Laterality: Left

## 2020-11-23 MED ORDER — ENSURE PRE-SURGERY PO LIQD: 296.0000 mL | Freq: Once | ORAL | Status: DC

## 2020-11-23 MED ORDER — GLYCOPYRROLATE PF 0.2 MG/ML IJ SOSY
PREFILLED_SYRINGE | INTRAMUSCULAR | Status: AC
  Filled 2020-11-23: qty 2

## 2020-11-23 MED ORDER — 0.9 % SODIUM CHLORIDE (POUR BTL) OPTIME
TOPICAL | Status: DC | PRN
  Administered 2020-11-23: 1000 mL

## 2020-11-23 MED ORDER — SODIUM CHLORIDE 0.9 % IV SOLN: INTRAVENOUS | Status: DC

## 2020-11-23 MED ORDER — DEXAMETHASONE SODIUM PHOSPHATE 10 MG/ML IJ SOLN
INTRAMUSCULAR | Status: AC
  Filled 2020-11-23: qty 1

## 2020-11-23 MED ORDER — CHLORHEXIDINE GLUCONATE 0.12 % MT SOLN
15.0000 mL | Freq: Once | OROMUCOSAL | Status: AC
  Administered 2020-11-23: 15 mL via OROMUCOSAL
  Filled 2020-11-23: qty 15

## 2020-11-23 MED ORDER — CEFAZOLIN SODIUM-DEXTROSE 2-4 GM/100ML-% IV SOLN
2.0000 g | INTRAVENOUS | Status: AC
  Administered 2020-11-23: 2 g via INTRAVENOUS
  Filled 2020-11-23: qty 100

## 2020-11-23 MED ORDER — ORAL CARE MOUTH RINSE: 15.0000 mL | Freq: Once | OROMUCOSAL | Status: AC

## 2020-11-23 MED ORDER — CHLORHEXIDINE GLUCONATE CLOTH 2 % EX PADS: 6.0000 | MEDICATED_PAD | Freq: Once | CUTANEOUS | Status: DC

## 2020-11-23 MED ORDER — LIDOCAINE 2% (20 MG/ML) 5 ML SYRINGE
INTRAMUSCULAR | Status: DC | PRN
  Administered 2020-11-23: 60 mg via INTRAVENOUS

## 2020-11-23 MED ORDER — NEOSTIGMINE METHYLSULFATE 3 MG/3ML IV SOSY
PREFILLED_SYRINGE | INTRAVENOUS | Status: AC
  Filled 2020-11-23: qty 3

## 2020-11-23 MED ORDER — CISATRACURIUM BESYLATE (PF) 10 MG/5ML IV SOLN
INTRAVENOUS | Status: DC | PRN
  Administered 2020-11-23: 12 mg via INTRAVENOUS
  Administered 2020-11-23: 4 mg via INTRAVENOUS

## 2020-11-23 MED ORDER — GLYCOPYRROLATE PF 0.2 MG/ML IJ SOSY
PREFILLED_SYRINGE | INTRAMUSCULAR | Status: DC | PRN
  Administered 2020-11-23 (×2): .3 mg via INTRAVENOUS
  Administered 2020-11-23: .2 mg via INTRAVENOUS

## 2020-11-23 MED ORDER — ACETAMINOPHEN 500 MG PO TABS
1000.0000 mg | ORAL_TABLET | ORAL | Status: AC
  Administered 2020-11-23: 1000 mg via ORAL
  Filled 2020-11-23: qty 2

## 2020-11-23 MED ORDER — SUCCINYLCHOLINE CHLORIDE 200 MG/10ML IV SOSY
PREFILLED_SYRINGE | INTRAVENOUS | Status: AC
  Filled 2020-11-23: qty 10

## 2020-11-23 MED ORDER — LACTATED RINGERS IV SOLN: INTRAVENOUS | Status: DC

## 2020-11-23 MED ORDER — ROCURONIUM BROMIDE 10 MG/ML (PF) SYRINGE
PREFILLED_SYRINGE | INTRAVENOUS | Status: AC
  Filled 2020-11-23: qty 10

## 2020-11-23 MED ORDER — LIDOCAINE 2% (20 MG/ML) 5 ML SYRINGE
INTRAMUSCULAR | Status: AC
  Filled 2020-11-23: qty 5

## 2020-11-23 MED ORDER — BUPIVACAINE HCL (PF) 0.25 % IJ SOLN
INTRAMUSCULAR | Status: AC
  Filled 2020-11-23: qty 30

## 2020-11-23 MED ORDER — PROMETHAZINE HCL 25 MG/ML IJ SOLN
INTRAMUSCULAR | Status: AC
  Filled 2020-11-23: qty 1

## 2020-11-23 MED ORDER — PROPOFOL 10 MG/ML IV BOLUS
INTRAVENOUS | Status: AC
  Filled 2020-11-23: qty 20

## 2020-11-23 MED ORDER — ONDANSETRON HCL 4 MG/2ML IJ SOLN
INTRAMUSCULAR | Status: DC | PRN
  Administered 2020-11-23: 4 mg via INTRAVENOUS

## 2020-11-23 MED ORDER — FENTANYL CITRATE (PF) 250 MCG/5ML IJ SOLN
INTRAMUSCULAR | Status: AC
  Filled 2020-11-23: qty 5

## 2020-11-23 MED ORDER — DEXAMETHASONE SODIUM PHOSPHATE 10 MG/ML IJ SOLN
INTRAMUSCULAR | Status: DC | PRN
  Administered 2020-11-23: 10 mg via INTRAVENOUS

## 2020-11-23 MED ORDER — FENTANYL CITRATE (PF) 100 MCG/2ML IJ SOLN: 25.0000 ug | INTRAMUSCULAR | Status: DC | PRN

## 2020-11-23 MED ORDER — NEOSTIGMINE METHYLSULFATE 3 MG/3ML IV SOSY
PREFILLED_SYRINGE | INTRAVENOUS | Status: DC | PRN
  Administered 2020-11-23: 3 mg via INTRAVENOUS
  Administered 2020-11-23: 2 mg via INTRAVENOUS

## 2020-11-23 MED ORDER — LACTATED RINGERS IV SOLN: INTRAVENOUS | Status: DC | PRN

## 2020-11-23 MED ORDER — TRAMADOL HCL 50 MG PO TABS: 50.0000 mg | ORAL_TABLET | Freq: Four times a day (QID) | ORAL | 0 refills | Status: DC | PRN

## 2020-11-23 MED ORDER — PROMETHAZINE HCL 25 MG/ML IJ SOLN
6.2500 mg | INTRAMUSCULAR | Status: DC | PRN
  Administered 2020-11-23: 6.25 mg via INTRAVENOUS

## 2020-11-23 MED ORDER — CISATRACURIUM BESYLATE 20 MG/10ML IV SOLN
INTRAVENOUS | Status: AC
  Filled 2020-11-23: qty 10

## 2020-11-23 MED ORDER — FENTANYL CITRATE (PF) 250 MCG/5ML IJ SOLN
INTRAMUSCULAR | Status: DC | PRN
  Administered 2020-11-23: 150 ug via INTRAVENOUS

## 2020-11-23 MED ORDER — ROCURONIUM BROMIDE 100 MG/10ML IV SOLN: INTRAVENOUS | Status: DC | PRN

## 2020-11-23 MED ORDER — BUPIVACAINE HCL 0.25 % IJ SOLN
INTRAMUSCULAR | Status: DC | PRN
  Administered 2020-11-23: 5 mL

## 2020-11-23 MED ORDER — PROPOFOL 10 MG/ML IV BOLUS
INTRAVENOUS | Status: DC | PRN
  Administered 2020-11-23: 200 mg via INTRAVENOUS

## 2020-11-23 SURGICAL SUPPLY — 45 items
ADH SKN CLS APL DERMABOND .7 (GAUZE/BANDAGES/DRESSINGS) ×1
CANISTER SUCT 3000ML PPV (MISCELLANEOUS) IMPLANT
COVER SURGICAL LIGHT HANDLE (MISCELLANEOUS) ×2 IMPLANT
COVER WAND RF STERILE (DRAPES) ×1 IMPLANT
DERMABOND ADVANCED (GAUZE/BANDAGES/DRESSINGS) ×1
DERMABOND ADVANCED .7 DNX12 (GAUZE/BANDAGES/DRESSINGS) ×1 IMPLANT
DISSECTOR BLUNT TIP ENDO 5MM (MISCELLANEOUS) IMPLANT
ELECT REM PT RETURN 9FT ADLT (ELECTROSURGICAL) ×2
ELECTRODE REM PT RTRN 9FT ADLT (ELECTROSURGICAL) ×1 IMPLANT
ENDOLOOP SUT PDS II  0 18 (SUTURE) ×2
ENDOLOOP SUT PDS II 0 18 (SUTURE) IMPLANT
GLOVE BIO SURGEON STRL SZ7.5 (GLOVE) ×2 IMPLANT
GOWN STRL REUS W/ TWL LRG LVL3 (GOWN DISPOSABLE) ×2 IMPLANT
GOWN STRL REUS W/ TWL XL LVL3 (GOWN DISPOSABLE) ×1 IMPLANT
GOWN STRL REUS W/TWL LRG LVL3 (GOWN DISPOSABLE) ×4
GOWN STRL REUS W/TWL XL LVL3 (GOWN DISPOSABLE) ×2
KIT BASIN OR (CUSTOM PROCEDURE TRAY) ×2 IMPLANT
KIT TURNOVER KIT B (KITS) ×2 IMPLANT
MESH 3DMAX 4X6 LT LRG (Mesh General) ×1 IMPLANT
NDL INSUFFLATION 14GA 120MM (NEEDLE) IMPLANT
NEEDLE INSUFFLATION 14GA 120MM (NEEDLE) IMPLANT
NS IRRIG 1000ML POUR BTL (IV SOLUTION) ×2 IMPLANT
PAD ARMBOARD 7.5X6 YLW CONV (MISCELLANEOUS) ×4 IMPLANT
RELOAD STAPLE 4.0 BLU F/HERNIA (INSTRUMENTS) IMPLANT
RELOAD STAPLE 4.8 BLK F/HERNIA (STAPLE) IMPLANT
RELOAD STAPLE HERNIA 4.0 BLUE (INSTRUMENTS) ×2 IMPLANT
RELOAD STAPLE HERNIA 4.8 BLK (STAPLE) IMPLANT
SCISSORS LAP 5X35 DISP (ENDOMECHANICALS) ×2 IMPLANT
SET IRRIG TUBING LAPAROSCOPIC (IRRIGATION / IRRIGATOR) IMPLANT
SET TUBE SMOKE EVAC HIGH FLOW (TUBING) ×2 IMPLANT
STAPLER HERNIA 12 8.5 360D (INSTRUMENTS) ×1 IMPLANT
SUT MNCRL AB 4-0 PS2 18 (SUTURE) ×2 IMPLANT
SUT VIC AB 1 CT1 27 (SUTURE)
SUT VIC AB 1 CT1 27XBRD ANBCTR (SUTURE) IMPLANT
SYR CONTROL 10ML LL (SYRINGE) ×1 IMPLANT
SYRINGE TOOMEY DISP (SYRINGE) ×1 IMPLANT
TOWEL GREEN STERILE (TOWEL DISPOSABLE) ×2 IMPLANT
TOWEL GREEN STERILE FF (TOWEL DISPOSABLE) ×2 IMPLANT
TRAY FOLEY W/BAG SLVR 14FR (SET/KITS/TRAYS/PACK) ×1 IMPLANT
TRAY LAPAROSCOPIC MC (CUSTOM PROCEDURE TRAY) ×2 IMPLANT
TROCAR OPTICAL SHORT 5MM (TROCAR) ×2 IMPLANT
TROCAR OPTICAL SLV SHORT 5MM (TROCAR) ×2 IMPLANT
TROCAR XCEL 12X100 BLDLESS (ENDOMECHANICALS) ×2 IMPLANT
WARMER LAPAROSCOPE (MISCELLANEOUS) ×2 IMPLANT
WATER STERILE IRR 1000ML POUR (IV SOLUTION) ×1 IMPLANT

## 2020-11-23 NOTE — Progress Notes (Signed)
Patient has been in PACU for almost 3 hours and is in stable condition, ready for D/C. Patient is very agitated and aggressive. He is refusing help from staff. Will continue to monitor.

## 2020-11-23 NOTE — Op Note (Signed)
11/23/2020  8:24 AM  PATIENT:  Marvin Roberson  50 y.o. male  PRE-OPERATIVE DIAGNOSIS:  LEFT INGUINAL HERNIA  POST-OPERATIVE DIAGNOSIS:  LEFT INDIRECT  INGUINAL HERNIA  PROCEDURE:  Procedure(s): LAPAROSCOPIC LEFT INGUINAL HERNIA REPAIR WITH MESH (Left)  SURGEON:  Surgeon(s) and Role:    Ralene Ok, MD - Primary   ASSISTANTS: Pryor Curia, RNFA   ANESTHESIA:   local and general  EBL:  minimal   BLOOD ADMINISTERED:none  DRAINS: none   LOCAL MEDICATIONS USED:  BUPIVICAINE   SPECIMEN:  No Specimen  DISPOSITION OF SPECIMEN:  N/A  COUNTS:  YES  TOURNIQUET:  * No tourniquets in log *  DICTATION: .Dragon Dictation Counts: reported as correct x 2   Findings:  The patient had a moderate sized left indirect hernia and a moderated sized cord lipoma   Indications for procedure:  The patient is a 50 year old male with a left inguinal hernia for several months. Patient complained of symptomatology to the left inguinal area. The patient was taken back for elective inguinal hernia repair.   Details of the procedure: The patient was taken back to the operating room. The patient was placed in supine position with bilateral SCDs in place.  The patient was prepped and draped in the usual sterile fashion.  After appropriate anitbiotics were confirmed, a time-out was confirmed and all facts were verified.   0.25% Marcaine was used to infiltrate the umbilical area. A 11-blade was used to cut down the skin and blunt dissection was used to get the anterior fashion.  The anterior fascia was incised approximately 1 cm and the muscles were retracted laterally. Blunt dissection was then used to create a space in the preperitoneal area. At this time a 10 mm camera was then introduced into the space and advanced the pubic tubercle and a 12 mm trocar was placed over this and insufflation was started.  At this time and space was created from medial to laterally the preperitoneal space.  Cooper's  ligament was initially cleaned off.  The hernia sac was identified in the indirect space. Dissection of the hernia sac and cord structures was undertaken the vas deferens was identified and protected in all parts of the case.    Once the hernia sac was taken down to approximately the umbilicus a Left Bard 3D Max mesh, size: Large, was  introduced into the preperitoneal space.  The mesh was brought over to cover the direct and indirect hernia spaces.  This was anchored into place and secured to Cooper's ligament with 4.37mm staples from a [place] hernia stapler. It was anchored to the anterior abdominal wall with 4.8 mm staples. The hernia sac was seen lying posterior to the mesh. There was no staples placed laterally. The insufflation was evacuated and the peritoneum was seen posterior to the mesh. The trochars were removed. The anterior fascia was reapproximated using #1 Vicryl on a UR- 6.  Intra-abdominal air was evacuated and the Veress needle removed. The skin was reapproximated using 4-0 Monocryl subcuticular fashion and Dermabond. The patient was awakened from general anesthesia and taken to recovery in stable condition.   PLAN OF CARE: Discharge to home after PACU  PATIENT DISPOSITION:  PACU - hemodynamically stable.   Delay start of Pharmacological VTE agent (>24hrs) due to surgical blood loss or risk of bleeding: not applicable

## 2020-11-23 NOTE — Anesthesia Postprocedure Evaluation (Signed)
Anesthesia Post Note  Patient: Merlon Alcorta  Procedure(s) Performed: LAPAROSCOPIC LEFT INGUINAL HERNIA REPAIR WITH MESH (Left )     Patient location during evaluation: PACU Anesthesia Type: General Level of consciousness: awake Pain management: pain level controlled Vital Signs Assessment: post-procedure vital signs reviewed and stable Respiratory status: spontaneous breathing, nonlabored ventilation, respiratory function stable and patient connected to nasal cannula oxygen Cardiovascular status: blood pressure returned to baseline and stable Postop Assessment: no apparent nausea or vomiting Anesthetic complications: no   No complications documented.  Last Vitals:  Vitals:   11/23/20 1100 11/23/20 1115  BP: (!) 154/81 (!) 156/85  Pulse: (!) 56 (!) 57  Resp: 14 12  Temp:  36.7 C  SpO2: 94% 94%    Last Pain:  Vitals:   11/23/20 1115  TempSrc:   PainSc: 0-No pain                 Ryan P Ellender

## 2020-11-23 NOTE — Discharge Instructions (Signed)
[facility] _______[facility] °INGUINAL HERNIA REPAIR: POST OP INSTRUCTIONS ° °Always review your discharge instruction sheet given to you by the facility where your surgery was performed. °IF YOU HAVE DISABILITY OR FAMILY LEAVE FORMS, YOU MUST BRING THEM TO THE OFFICE FOR PROCESSING.   °DO NOT GIVE THEM TO YOUR DOCTOR. ° °1. A  prescription for pain medication may be given to you upon discharge.  Take your pain medication as prescribed, if needed.  If narcotic pain medicine is not needed, then you may take acetaminophen (Tylenol) or ibuprofen (Advil) as needed. °2. Take your usually prescribed medications unless otherwise directed. °If you need a refill on your pain medication, please contact your pharmacy.  They will contact our office to request authorization. Prescriptions will not be filled after 5 pm or on week-ends. °3. You should follow a light diet the first 24 hours after arrival home, such as soup and crackers, etc.  Be sure to include lots of fluids daily.  Resume your normal diet the day after surgery. °4.Most patients will experience some swelling and bruising around the umbilicus or in the groin and scrotum.  Ice packs and reclining will help.  Swelling and bruising can take several days to resolve.  °6. It is common to experience some constipation if taking pain medication after surgery.  Increasing fluid intake and taking a stool softener (such as Colace) will usually help or prevent this problem from occurring.  A mild laxative (Milk of Magnesia or Miralax) should be taken according to package directions if there are no bowel movements after 48 hours. °7. Unless discharge instructions indicate otherwise, you may remove your bandages 24-48 hours after surgery, and you may shower at that time.  You may have steri-strips (small skin tapes) in place directly over the incision.  These strips should be left on the skin for 7-10 days.  If your surgeon used skin glue on the incision, you may  shower in 24 hours.  The glue will flake off over the next 2-3 weeks.  Any sutures or staples will be removed at the office during your follow-up visit. °8. ACTIVITIES:  You may resume regular (light) daily activities beginning the next day--such as daily self-care, walking, climbing stairs--gradually increasing activities as tolerated.  You may have sexual intercourse when it is comfortable.  Refrain from any heavy lifting or straining until approved by your doctor. ° °a.You may drive when you are no longer taking prescription pain medication, you can comfortably wear a seatbelt, and you can safely maneuver your car and apply brakes. °b.RETURN TO WORK:   °_____________________________________________ ° °9.You should see your doctor in the office for a follow-up appointment approximately 2-3 weeks after your surgery.  Make sure that you call for this appointment within a day or two after you arrive home to insure a convenient appointment time. °10.OTHER INSTRUCTIONS: _________________________ °   _____________________________________ ° °WHEN TO CALL YOUR DOCTOR: °1. Fever over 101.0 °2. Inability to urinate °3. Nausea and/or vomiting °4. Extreme swelling or bruising °5. Continued bleeding from incision. °6. Increased pain, redness, or drainage from the incision ° °The clinic staff is available to answer your questions during regular business hours.  Please don’t hesitate to call and ask to speak to one of the nurses for clinical concerns.  If you have a medical emergency, go to the nearest emergency room or call 911.  A surgeon from [facility] is always on call at the hospital ° ° °[address], [address], [place], [place]  [address] ? ° [address] [address], [place], [place]   [address] °[phone] ? [phone] ? FAX [phone] °Web site: [email or web address] ° °

## 2020-11-23 NOTE — Transfer of Care (Signed)
Immediate Anesthesia Transfer of Care Note  Patient: Marvin Roberson  Procedure(s) Performed: LAPAROSCOPIC LEFT INGUINAL HERNIA REPAIR WITH MESH (Left )  Patient Location: PACU  Anesthesia Type:General  Level of Consciousness: awake, alert , oriented and patient cooperative  Airway & Oxygen Therapy: Patient Spontanous Breathing and Patient connected to face mask oxygen  Post-op Assessment: Report given to RN, Post -op Vital signs reviewed and stable and Patient moving all extremities  Post vital signs: Reviewed and stable  Last Vitals:  Vitals Value Taken Time  BP 160/79 11/23/20 0839  Temp 36.2 C 11/23/20 0839  Pulse 61 11/23/20 0852  Resp 16 11/23/20 0852  SpO2 93 % 11/23/20 0852  Vitals shown include unvalidated device data.  Last Pain:  Vitals:   11/23/20 0839  TempSrc:   PainSc: Asleep         Complications: No complications documented.

## 2020-11-23 NOTE — Anesthesia Procedure Notes (Signed)
Procedure Name: Intubation Date/Time: 11/23/2020 7:41 AM Performed by: Myna Bright, CRNA Pre-anesthesia Checklist: Patient identified, Emergency Drugs available, Suction available and Patient being monitored Patient Re-evaluated:Patient Re-evaluated prior to induction Oxygen Delivery Method: Circle system utilized Preoxygenation: Pre-oxygenation with 100% oxygen Induction Type: IV induction Ventilation: Mask ventilation with difficulty and Oral airway inserted - appropriate to patient size Laryngoscope Size: Mac and 4 Grade View: Grade I Tube type: Oral Tube size: 7.5 mm Number of attempts: 1 Airway Equipment and Method: Stylet Placement Confirmation: ETT inserted through vocal cords under direct vision,  positive ETCO2 and breath sounds checked- equal and bilateral Secured at: 22 cm Tube secured with: Tape Dental Injury: Teeth and Oropharynx as per pre-operative assessment

## 2020-11-24 ENCOUNTER — Encounter (HOSPITAL_COMMUNITY): Payer: Self-pay | Admitting: General Surgery

## 2020-12-05 ENCOUNTER — Inpatient Hospital Stay: Admission: RE | Admit: 2020-12-05 | Payer: Medicare Other | Source: Ambulatory Visit

## 2020-12-05 DIAGNOSIS — Z992 Dependence on renal dialysis: Secondary | ICD-10-CM | POA: Diagnosis not present

## 2020-12-05 DIAGNOSIS — I129 Hypertensive chronic kidney disease with stage 1 through stage 4 chronic kidney disease, or unspecified chronic kidney disease: Secondary | ICD-10-CM | POA: Diagnosis not present

## 2020-12-05 DIAGNOSIS — N186 End stage renal disease: Secondary | ICD-10-CM | POA: Diagnosis not present

## 2020-12-06 ENCOUNTER — Encounter (HOSPITAL_COMMUNITY): Payer: Self-pay

## 2020-12-06 ENCOUNTER — Emergency Department (HOSPITAL_COMMUNITY): Payer: Medicare Other

## 2020-12-06 ENCOUNTER — Emergency Department (HOSPITAL_COMMUNITY)
Admission: EM | Admit: 2020-12-06 | Discharge: 2020-12-06 | Disposition: A | Discharge status: Home / Self Care | Payer: Medicare Other | Attending: Emergency Medicine | Admitting: Emergency Medicine

## 2020-12-06 ENCOUNTER — Other Ambulatory Visit: Payer: Self-pay

## 2020-12-06 DIAGNOSIS — I517 Cardiomegaly: Secondary | ICD-10-CM | POA: Diagnosis not present

## 2020-12-06 DIAGNOSIS — I2510 Atherosclerotic heart disease of native coronary artery without angina pectoris: Secondary | ICD-10-CM | POA: Diagnosis not present

## 2020-12-06 DIAGNOSIS — I5043 Acute on chronic combined systolic (congestive) and diastolic (congestive) heart failure: Secondary | ICD-10-CM | POA: Diagnosis not present

## 2020-12-06 DIAGNOSIS — N186 End stage renal disease: Secondary | ICD-10-CM | POA: Diagnosis not present

## 2020-12-06 DIAGNOSIS — Z79899 Other long term (current) drug therapy: Secondary | ICD-10-CM | POA: Insufficient documentation

## 2020-12-06 DIAGNOSIS — I440 Atrioventricular block, first degree: Secondary | ICD-10-CM | POA: Diagnosis not present

## 2020-12-06 DIAGNOSIS — Z992 Dependence on renal dialysis: Secondary | ICD-10-CM | POA: Diagnosis not present

## 2020-12-06 DIAGNOSIS — I132 Hypertensive heart and chronic kidney disease with heart failure and with stage 5 chronic kidney disease, or end stage renal disease: Secondary | ICD-10-CM | POA: Insufficient documentation

## 2020-12-06 DIAGNOSIS — R197 Diarrhea, unspecified: Secondary | ICD-10-CM | POA: Diagnosis not present

## 2020-12-06 DIAGNOSIS — R1032 Left lower quadrant pain: Secondary | ICD-10-CM | POA: Diagnosis not present

## 2020-12-06 DIAGNOSIS — Z8616 Personal history of COVID-19: Secondary | ICD-10-CM | POA: Insufficient documentation

## 2020-12-06 DIAGNOSIS — R1084 Generalized abdominal pain: Secondary | ICD-10-CM | POA: Diagnosis not present

## 2020-12-06 DIAGNOSIS — R0602 Shortness of breath: Secondary | ICD-10-CM | POA: Diagnosis not present

## 2020-12-06 DIAGNOSIS — E161 Other hypoglycemia: Secondary | ICD-10-CM | POA: Diagnosis not present

## 2020-12-06 DIAGNOSIS — R0600 Dyspnea, unspecified: Secondary | ICD-10-CM | POA: Diagnosis not present

## 2020-12-06 DIAGNOSIS — K5900 Constipation, unspecified: Secondary | ICD-10-CM | POA: Diagnosis not present

## 2020-12-06 DIAGNOSIS — I120 Hypertensive chronic kidney disease with stage 5 chronic kidney disease or end stage renal disease: Secondary | ICD-10-CM | POA: Diagnosis not present

## 2020-12-06 DIAGNOSIS — R531 Weakness: Secondary | ICD-10-CM | POA: Diagnosis present

## 2020-12-06 DIAGNOSIS — F17210 Nicotine dependence, cigarettes, uncomplicated: Secondary | ICD-10-CM | POA: Insufficient documentation

## 2020-12-06 DIAGNOSIS — E162 Hypoglycemia, unspecified: Secondary | ICD-10-CM | POA: Diagnosis not present

## 2020-12-06 DIAGNOSIS — J811 Chronic pulmonary edema: Secondary | ICD-10-CM | POA: Diagnosis not present

## 2020-12-06 LAB — BASIC METABOLIC PANEL
Anion gap: 13 (ref 5–15)
BUN: 50 mg/dL — ABNORMAL HIGH (ref 6–20)
CO2: 23 mmol/L (ref 22–32)
Calcium: 8 mg/dL — ABNORMAL LOW (ref 8.9–10.3)
Chloride: 98 mmol/L (ref 98–111)
Creatinine, Ser: 15.64 mg/dL — ABNORMAL HIGH (ref 0.61–1.24)
GFR, Estimated: 3 mL/min — ABNORMAL LOW (ref >60)
Glucose, Bld: 74 mg/dL (ref 70–99)
Potassium: 4.6 mmol/L (ref 3.5–5.1)
Sodium: 134 mmol/L — ABNORMAL LOW (ref 135–145)

## 2020-12-06 LAB — CBC
HCT: 29.1 % — ABNORMAL LOW (ref 39.0–52.0)
Hemoglobin: 9 g/dL — ABNORMAL LOW (ref 13.0–17.0)
MCH: 27.1 pg (ref 26.0–34.0)
MCHC: 30.9 g/dL (ref 30.0–36.0)
MCV: 87.7 fL (ref 80.0–100.0)
Platelets: 93 10*3/uL — ABNORMAL LOW (ref 150–400)
RBC: 3.32 MIL/uL — ABNORMAL LOW (ref 4.22–5.81)
RDW: 21.5 % — ABNORMAL HIGH (ref 11.5–15.5)
WBC: 5.4 10*3/uL (ref 4.0–10.5)
nRBC: 0 % (ref 0.0–0.2)

## 2020-12-06 LAB — CBG MONITORING, ED
Glucose-Capillary: 75 mg/dL (ref 70–99)
Glucose-Capillary: 94 mg/dL (ref 70–99)

## 2020-12-06 NOTE — Discharge Instructions (Addendum)
It is very important to go to dialysis tomorrow, 2/3.  If at any point you develop new or worsening shortness of breath, fever, chest pain, or any other new/concerning symptoms then return to the ER.

## 2020-12-06 NOTE — ED Triage Notes (Signed)
Patient arrived by [facility] for weakness and ongoing left lower quadrant pain post hernia repair several days ago. Patient was due for dialysis today but states that he was too weak to go. Alert and oriented, initial BS 58 and following oral treatment by EMS BS 72 on arrival to triage

## 2020-12-06 NOTE — Progress Notes (Signed)
Renal Navigator received call from EDP/Dr. Regenia Skeeter stating patient has been deemed stable to discharge from the ED, but has missed HD today due to ED visit. Navigator has reschedule patient's HD for tomorrow, Thursday, 2/3 [facility] at 12:00pm. Patient needs to arrive to this appointment at 11:40am. Navigator spoke with patient who is understanding and appreciative. He usually drives himself and states he can find a ride or have his daughter get him an Uber/Lyft if he feels too weak to drive himself to treatment tomorrow. Navigator explained need to also attend regularly scheduled tx on Friday as tomorrow's treatment is to make up for missing today. Patient agreed. Navigator also suggests to patient that he enroll in [facility] so that is available to him in the future should he ever need it. He agreed. Phone number/[phone] provided to patient. EDP updated.   Alphonzo Cruise, [place] Renal Navigator [phone]

## 2020-12-06 NOTE — ED Provider Notes (Signed)
[facility] EMERGENCY DEPARTMENT Provider Note   CSN: [id number] Arrival date & time: 12/06/20  1018     History No chief complaint on file.   Marvin Roberson is a 50 y.o. male.  HPI 50 year old male presents feeling generally weak since his laparoscopic hernia repair about a week ago.  He states that he has not been able to eat or drink because of lack of appetite.  No vomiting.  Mix of diarrhea and constipation.  No significant pain at the surgical sites.  He denies abdominal pain.  He does feel little short of breath today.  No fevers or chest pain.  He missed dialysis 2 days ago (Monday) and today.  He feels too weak to go.  Last dialysis was on 1/28.  Past Medical History:  Diagnosis Date  . Adrenal insufficiency ([place])   . Allergy   . Anemia   . Anxiety   . Depression   . Dyspnea    "when I have too much fluid"  . ESRD on dialysis [facility]) since 1990s   "M,W,F; [address]" (11/27/2016)  . GERD (gastroesophageal reflux disease)   . Heart failure with reduced ejection fraction ([place])   . High cholesterol   . History of blood transfusion   . Hypertension   . Kidney failure   . Renal insufficiency   . Stroke ([place]) 2016   decreased vision in his left eye/notes 11/27/2016  . Tricuspid regurgitation     Patient Active Problem List   Diagnosis Date Noted  . Preop cardiovascular exam 11/16/2020  . HFrEF (heart failure with reduced ejection fraction) ([place]) 11/16/2020  . Thoracic aortic aneurysm without rupture ([place]) 11/16/2020  . Nonrheumatic tricuspid valve regurgitation 11/16/2020  . Tobacco abuse 11/16/2020  . Syncope and collapse 09/19/2020  . Acute on chronic combined systolic and diastolic CHF (congestive heart failure) ([place]) 09/19/2020  . Essential hypertension 09/19/2020  . Pneumonia due to COVID-19 virus 06/13/2020  . Hepatic cirrhosis ([place]) 03/09/2020  . Encounter for other preprocedural examination 03/09/2020  . Chronic nonintractable headache  01/28/2019  . Thrombocytopenia ([place]) 08/13/2018  . ESRD on dialysis ([place])   . ESRD (end stage renal disease) ([place]) 01/14/2018  . Hyperkalemia, diminished renal excretion 10/20/2017  . Post-operative complication 09/98/3382  . 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 ESRD on hemodialysis ([place])   . 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 Adrenal insufficiency ([place])   . Hypoglycemia due to insulin 11/27/2016  . Hypertension 11/27/2016  . History of CVA (cerebrovascular accident) 11/27/2016  . Monoclonal gammopathy of unknown significance (MGUS) 11/27/2016  . Bradycardia 02/26/2016  . Hypertensive cardiomyopathy ([place]) 01/22/2016  . Hypertensive kidney disease with ESRD (end-stage renal disease) ([place]) 01/22/2016  . Staphylococcus aureus bacteremia 01/22/2016  . Hydrocele 12/01/2015  . CAD (coronary artery disease) 07/24/2015  . 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 Anticoagulation goal of INR 2 to 3 07/05/2014  . Avascular necrosis of femoral head ([place]) 07/05/2014  . Intestinal occlusion ([place]) 07/05/2014  . Weakness of both hips 07/05/2014  . Anemia, chronic disease 03/18/2014  . Hyponatremia 03/18/2014  . Retroperitoneal lymphadenopathy 03/18/2014  . Weight loss 03/03/2014  . Sepsis ([place]) 03/30/2013  . Migraine headache 10/11/2012  . Pulmonary embolism ([place]) 10/06/2012  . DVT of upper extremity (deep vein thrombosis) ([place]) 10/05/2012  . Cellulitis of left arm 10/04/2012    Past Surgical History:  Procedure Laterality Date  . ANGIOPLASTY Left 12/11/2016   Procedure: ANGIOPLASTY LEFT  ARM & SUBCLAVIAN ARTERY;  Surgeon: Conrad Lake Viking, MD;  Location: [facility];  Service: Vascular;  Laterality: Left;  . arm surgery Left 2016   "for aneurysm"  . AV FISTULA PLACEMENT    . FALSE ANEURYSM REPAIR Left 12/11/2016   Procedure: RESECTION  BRACHIAL ARTERY;  Surgeon: Conrad [place]erry, MD;  Location: [facility];  Service: Vascular;  Laterality: Left;  . FALSE ANEURYSM REPAIR Left  01/14/2018   Procedure: REPAIR FALSE ANEURYSM LEFT BRACHIAL ARTERY WITH SAPHENOUS VEIN  LEFT ARTERIOVENOUS GRAFT;  Surgeon: Rosetta Posner, MD;  Location: [place];  Service: Vascular;  Laterality: Left;  . INGUINAL HERNIA REPAIR Left 11/23/2020   Procedure: LAPAROSCOPIC LEFT INGUINAL HERNIA REPAIR WITH MESH;  Surgeon: Ralene Ok, MD;  Location: [facility];  Service: General;  Laterality: Left;  . INSERTION OF DIALYSIS CATHETER Right    chest  . IR FLUORO GUIDE CV LINE RIGHT  11/17/2019  . THROMBECTOMY BRACHIAL ARTERY  12/11/2016   Procedure: THROMBECTOMY BRACHIAL ARTERY AND ULNAR;  Surgeon: Conrad Sandstone, MD;  Location: [facility];  Service: Vascular;;  . THYROIDECTOMY, PARTIAL    . UPPER EXTREMITY ANGIOGRAM Left 12/11/2016   Procedure: LEFT ARM ANGIOGRAM;  Surgeon: Conrad Woodruff, MD;  Location: [facility];  Service: Vascular;  Laterality: Left;  Marland Kitchen VEIN HARVEST Left 01/14/2018   Procedure: VEIN HARVEST LEFT SAPHENOUS VEIN;  Surgeon: Rosetta Posner, MD;  Location: [place];  Service: Vascular;  Laterality: Left;  . WOUND EXPLORATION Left 12/11/2016   Procedure: LEFT ARM BRACHIAL ARTERY WITH INTERPOSTIONAL GRAFT;  Surgeon: Conrad , MD;  [facility];  Service: Vascular;  Laterality: Left;       Family History  Problem Relation Age of Onset  . Hypertension Other   . Cancer Brother        in leg and lungs  . Heart attack Mother   . Adrenal disorder Neg Hx   . Colon cancer Neg Hx   . Rectal cancer Neg Hx   . Stomach cancer Neg Hx     Social History   Tobacco Use  . Smoking status: Current Every Day Smoker    Packs/day: 0.25    Years: 20.00    Pack years: 5.00    Types: Cigarettes  . Smokeless tobacco: Never Used  . Tobacco comment: 1 pk every 4 days.  Vaping Use  . Vaping Use: Never used  Substance Use Topics  . Alcohol use: Not Currently  . Drug use: No    Home Medications Prior to Admission medications   Medication Sig Start Date End Date Taking? Authorizing Provider  acetaminophen  (TYLENOL) 500 MG tablet Take 500 mg by mouth every 6 (six) hours as needed for mild pain.  01/03/20 01/01/21  [provider]  amLODipine (NORVASC) 10 MG tablet Take 10 mg by mouth at bedtime.  01/07/19   [provider]  atorvastatin (LIPITOR) 40 MG tablet Take 40 mg by mouth at bedtime.  01/27/16   [provider]  calcium acetate (PHOSLO) 667 MG capsule Take 1,334 mg by mouth 3 (three) times daily with meals. 01/08/19   [provider]  carvedilol (COREG) 25 MG tablet Take 25 mg by mouth 2 (two) times daily with a meal.    [provider]  guaiFENesin-dextromethorphan (ROBITUSSIN DM) 100-10 MG/5ML syrup Take 10 mLs by mouth every 4 (four) hours as needed for cough. 06/16/20   Mosetta Anis, MD  hydrALAZINE (APRESOLINE) 100 MG tablet Take  100 mg by mouth 3 (three) times daily.    [provider]  hydrocortisone (CORTEF) 5 MG tablet Take 5 mg by mouth daily.     [provider]  isosorbide dinitrate (ISORDIL) 10 MG tablet Take 1 tablet (10 mg total) by mouth 3 (three) times daily. 11/16/20   Chandrasekhar, Terisa Starr, MD  traMADol (ULTRAM) 50 MG tablet Take 1 tablet (50 mg total) by mouth every 6 (six) hours as needed. 11/23/20 11/23/21  Ralene Ok, MD    Allergies    Doxycycline, Codeine, Gabapentin, Lisinopril, Povidone-iodine, and Betadine [povidone iodine]  Review of Systems   Review of Systems  Constitutional: Negative for fever.  Respiratory: Positive for shortness of breath.   Cardiovascular: Negative for chest pain.  Gastrointestinal: Positive for constipation and diarrhea. Negative for abdominal pain and vomiting.  Neurological: Positive for weakness.  All other systems reviewed and are negative.   Physical Exam Updated Vital Signs BP (!) 172/89 (BP Location: Right Leg)   Pulse 70   Temp 97.8 F (36.6 C) (Oral)   Resp 17   Ht 5\' 8"  (1.727 m)   Wt 61.2 kg   SpO2 99%   BMI 20.53 kg/m   Physical Exam Vitals and  nursing note reviewed.  Constitutional:      Appearance: He is well-developed and well-nourished. He is not ill-appearing or diaphoretic.  HENT:     Head: Normocephalic and atraumatic.     Right Ear: External ear normal.     Left Ear: External ear normal.     Nose: Nose normal.  Eyes:     General:        Right eye: No discharge.        Left eye: No discharge.  Cardiovascular:     Rate and Rhythm: Normal rate and regular rhythm.     Heart sounds: Normal heart sounds.  Pulmonary:     Effort: Pulmonary effort is normal.     Breath sounds: Normal breath sounds. No wheezing, rhonchi or rales.  Abdominal:     Palpations: Abdomen is soft.     Tenderness: There is no abdominal tenderness.  Musculoskeletal:        General: No edema.     Cervical back: Neck supple.  Skin:    General: Skin is warm and dry.  Neurological:     Mental Status: He is alert.  Psychiatric:        Mood and Affect: Mood is not anxious.     ED Results / Procedures / Treatments   Labs (all labs ordered are listed, but only abnormal results are displayed) Labs Reviewed  CBC - Abnormal; Notable for the following components:      Result Value   RBC 3.32 (*)    Hemoglobin 9.0 (*)    HCT 29.1 (*)    RDW 21.5 (*)    Platelets 93 (*)    All other components within normal limits  BASIC METABOLIC PANEL - Abnormal; Notable for the following components:   Sodium 134 (*)    BUN 50 (*)    Creatinine, Ser 15.64 (*)    Calcium 8.0 (*)    GFR, Estimated 3 (*)    All other components within normal limits  CBG MONITORING, ED  CBG MONITORING, ED    EKG EKG Interpretation  Date/Time:  Wednesday December 06 2020 11:59:44 EST Ventricular Rate:  68 PR Interval:    QRS Duration: 108 QT Interval:  459 QTC Calculation: 489 R Axis:   -  43 Text Interpretation: Sinus rhythm Borderline low voltage, extremity leads Left ventricular hypertrophy Borderline prolonged QT interval Artifact in lead(s) I II III aVR aVL aVF V1  V2 Interpretation limited secondary to artifact Confirmed by Sherwood Gambler [id number]) on 12/06/2020 12:07:22 PM   Radiology DG Chest Portable 1 View  Result Date: 12/06/2020 CLINICAL DATA:  Missed dialysis. EXAM: PORTABLE CHEST 1 VIEW COMPARISON:  09/29/2020. FINDINGS: Dialysis catheter stable position. Cardiomegaly with mild pulmonary venous congestion and bilateral interstitial prominence suggesting mild CHF. No pleural effusion or pneumothorax. Left subclavian stents noted in stable position. Surgical clips left upper extremity. IMPRESSION: 1. Dialysis catheter stable position. 2. Cardiomegaly with mild pulmonary venous congestion and bilateral interstitial prominence suggesting mild CHF. Electronically Signed   By: Marcello Moores  Register   On: 12/06/2020 12:21   DG Abd Portable 1 View  Result Date: 12/06/2020 CLINICAL DATA:  Dyspnea. EXAM: PORTABLE ABDOMEN - 1 VIEW COMPARISON:  None. FINDINGS: The bowel gas pattern is normal. No radio-opaque calculi are seen. Probable avascular necrosis is seen involving both femoral heads. IMPRESSION: No evidence of bowel obstruction or ileus. Probable avascular necrosis of both femoral heads. Electronically Signed   By: Marijo Conception M.D.   On: 12/06/2020 12:22    Procedures Procedures   Medications Ordered in ED Medications - No data to display  ED Course  I have reviewed the triage vital signs and the nursing notes.  Pertinent labs & imaging results that were available during my care of the patient were reviewed by me and considered in my medical decision making (see chart for details).    MDM Rules/Calculators/A&P                          Patient is not ill appearing. Has HTN but no other significant vital sign abnormalities.  His labs are reassuring.  His chest x-ray is showing some mild pulmonary edema.  However he is not hypoxic or appearing to have increased work of breathing.  Abdominal exam is benign.  Discussed with Dr. Marval Regal, who recommends  dialysis tomorrow as there is no emergent indication now.  Renal navigator, Terri Piedra was able to help arrange for his dialysis and transportation tomorrow.  Discharge. Final Clinical Impression(s) / ED Diagnoses Final diagnoses:  End-stage renal disease needing dialysis [facility])    Rx / DC Orders ED Discharge Orders    None       Sherwood Gambler, MD 12/06/20 1535

## 2020-12-07 ENCOUNTER — Encounter (HOSPITAL_COMMUNITY): Payer: Self-pay | Admitting: General Surgery

## 2020-12-07 DIAGNOSIS — N186 End stage renal disease: Secondary | ICD-10-CM | POA: Diagnosis not present

## 2020-12-07 DIAGNOSIS — N2581 Secondary hyperparathyroidism of renal origin: Secondary | ICD-10-CM | POA: Diagnosis not present

## 2020-12-07 DIAGNOSIS — E875 Hyperkalemia: Secondary | ICD-10-CM | POA: Diagnosis not present

## 2020-12-07 DIAGNOSIS — D509 Iron deficiency anemia, unspecified: Secondary | ICD-10-CM | POA: Diagnosis not present

## 2020-12-07 DIAGNOSIS — Z992 Dependence on renal dialysis: Secondary | ICD-10-CM | POA: Diagnosis not present

## 2020-12-07 DIAGNOSIS — D631 Anemia in chronic kidney disease: Secondary | ICD-10-CM | POA: Diagnosis not present

## 2020-12-19 DIAGNOSIS — Z992 Dependence on renal dialysis: Secondary | ICD-10-CM | POA: Diagnosis not present

## 2020-12-19 DIAGNOSIS — N2581 Secondary hyperparathyroidism of renal origin: Secondary | ICD-10-CM | POA: Diagnosis not present

## 2020-12-19 DIAGNOSIS — E8779 Other fluid overload: Secondary | ICD-10-CM | POA: Insufficient documentation

## 2020-12-19 DIAGNOSIS — N186 End stage renal disease: Secondary | ICD-10-CM | POA: Diagnosis not present

## 2020-12-21 ENCOUNTER — Inpatient Hospital Stay: Admission: RE | Admit: 2020-12-21 | Payer: Medicare Other | Source: Ambulatory Visit

## 2021-01-02 DIAGNOSIS — N186 End stage renal disease: Secondary | ICD-10-CM | POA: Diagnosis not present

## 2021-01-02 DIAGNOSIS — I129 Hypertensive chronic kidney disease with stage 1 through stage 4 chronic kidney disease, or unspecified chronic kidney disease: Secondary | ICD-10-CM | POA: Diagnosis not present

## 2021-01-02 DIAGNOSIS — Z992 Dependence on renal dialysis: Secondary | ICD-10-CM | POA: Diagnosis not present

## 2021-01-03 DIAGNOSIS — Z992 Dependence on renal dialysis: Secondary | ICD-10-CM | POA: Diagnosis not present

## 2021-01-03 DIAGNOSIS — D509 Iron deficiency anemia, unspecified: Secondary | ICD-10-CM | POA: Diagnosis not present

## 2021-01-03 DIAGNOSIS — N2581 Secondary hyperparathyroidism of renal origin: Secondary | ICD-10-CM | POA: Diagnosis not present

## 2021-01-03 DIAGNOSIS — E875 Hyperkalemia: Secondary | ICD-10-CM | POA: Diagnosis not present

## 2021-01-03 DIAGNOSIS — N186 End stage renal disease: Secondary | ICD-10-CM | POA: Diagnosis not present

## 2021-01-03 DIAGNOSIS — D631 Anemia in chronic kidney disease: Secondary | ICD-10-CM | POA: Diagnosis not present

## 2021-01-08 DIAGNOSIS — R5383 Other fatigue: Secondary | ICD-10-CM | POA: Insufficient documentation

## 2021-01-10 DIAGNOSIS — R5383 Other fatigue: Secondary | ICD-10-CM | POA: Diagnosis not present

## 2021-01-10 DIAGNOSIS — E2749 Other adrenocortical insufficiency: Secondary | ICD-10-CM | POA: Diagnosis not present

## 2021-01-25 ENCOUNTER — Ambulatory Visit: Payer: Medicare Other | Admitting: Internal Medicine

## 2021-01-25 NOTE — Progress Notes (Deleted)
Cardiology Office Note:    Date:  01/25/2021   ID:  Marvin Roberson, DOB Sep 17, 1971, MRN [id number]  PCP:  Sonia Side., FNP  [facility] Cardiologist:   [place] Electrophysiologist:  None   CC: Follow up surgery  History of Present Illness:    Marvin Roberson is a 50 y.o. male with a hx of HFrEF (2015 30-35%-> 45%), HTN, Prior DVT and PE of unclear etiology, Prior COVID PNA, ESRD (still makes urine), Prior CVA, Prior Staph Bacteremia without IE (10/2012 A-[facility] with negative TEE), Mild aortic dilation without evidence of aortitis through chart review and OSH records review, who presented for evaluation 09/09/20.  Since prior:  Prior records received with additional past history of severe tricuspid regurgitation.  Had echocardiogram with improvement in TR.  Had one run of NSVT on [employer] Monitor.  Seen 11/16/20.  In interim of this visit, patient had surgery and ED visit for missed HD.  Seen 01/25/21  Patient notes that he is doing well.  Since last visit notes no symptoms with 9 beat run of NSVT changes. *** Relevant interval testing or therapy include his heart monitor and echocardiogram.  There are no interval hospital/ED visit.    No chest pain or pressure .  No SOB/DOE and no PND/Orthopnea.  No weight gain or leg swelling.  No palpitations or syncope.  Had anxiety on the heart monitor (not triggered; does not correlate to NSVT on monitor.   Ambulatory blood pressure not done but is getting a BP cuff.   Past Medical History:  Diagnosis Date  . Adrenal insufficiency ([place])   . Allergy   . Anemia   . Anxiety   . Depression   . Dyspnea    "when I have too much fluid"  . ESRD on dialysis [facility]) since 1990s   "M,W,F; [address]" (11/27/2016)  . GERD (gastroesophageal reflux disease)   . Heart failure with reduced ejection fraction ([place])   . High cholesterol   . History of blood transfusion   . Hypertension   . Kidney failure   . Renal insufficiency   . Stroke ([place]) 2016    decreased vision in his left eye/notes 11/27/2016  . Tricuspid regurgitation     Past Surgical History:  Procedure Laterality Date  . ANGIOPLASTY Left 12/11/2016   Procedure: ANGIOPLASTY LEFT ARM & SUBCLAVIAN ARTERY;  Surgeon: Conrad [place], MD;  Location: [place];  Service: Vascular;  Laterality: Left;  . arm surgery Left 2016   "for aneurysm"  . AV FISTULA PLACEMENT    . FALSE ANEURYSM REPAIR Left 12/11/2016   Procedure: RESECTION  BRACHIAL ARTERY;  Surgeon: Conrad [place], MD;  Location: [place];  Service: Vascular;  Laterality: Left;  . FALSE ANEURYSM REPAIR Left 01/14/2018   Procedure: REPAIR FALSE ANEURYSM LEFT BRACHIAL ARTERY WITH SAPHENOUS VEIN  LEFT ARTERIOVENOUS GRAFT;  Surgeon: Rosetta Posner, MD;  Location: [place];  Service: Vascular;  Laterality: Left;  . INGUINAL HERNIA REPAIR Left 11/23/2020   Procedure: LAPAROSCOPIC LEFT INGUINAL HERNIA REPAIR WITH MESH;  Surgeon: Ralene Ok, MD;  Location: [place];  Service: General;  Laterality: Left;  . INSERTION OF DIALYSIS CATHETER Right    chest  . IR FLUORO GUIDE CV LINE RIGHT  11/17/2019  . THROMBECTOMY BRACHIAL ARTERY  12/11/2016   Procedure: THROMBECTOMY BRACHIAL ARTERY AND ULNAR;  Surgeon: Conrad McMullen, MD;  Location: [place];  Service: Vascular;;  . THYROIDECTOMY, PARTIAL    . UPPER EXTREMITY ANGIOGRAM Left  12/11/2016   Procedure: LEFT ARM ANGIOGRAM;  Surgeon: Conrad [place], MD;  Location: [place];  Service: Vascular;  Laterality: Left;  Marland Kitchen VEIN HARVEST Left 01/14/2018   Procedure: VEIN HARVEST LEFT SAPHENOUS VEIN;  Surgeon: Rosetta Posner, MD;  Location: [place];  Service: Vascular;  Laterality: Left;  . WOUND EXPLORATION Left 12/11/2016   Procedure: LEFT ARM BRACHIAL ARTERY WITH INTERPOSTIONAL GRAFT;  Surgeon: Conrad Nederland, MD;  Location: [facility] OR;  Service: Vascular;  Laterality: Left;   Current Medications: No outpatient medications have been marked as taking for the 01/25/21 encounter (Appointment) with Werner Lean, MD.    Allergies:    Doxycycline, Codeine, Gabapentin, Lisinopril, Povidone-iodine, and Betadine [povidone iodine]   Social History   Socioeconomic History  . Marital status: Single    Spouse name: Not on file  . Number of children: Not on file  . Years of education: Not on file  . Highest education level: Not on file  Occupational History  . Not on file  Tobacco Use  . Smoking status: Current Every Day Smoker    Packs/day: 0.25    Years: 20.00    Pack years: 5.00    Types: Cigarettes  . Smokeless tobacco: Never Used  . Tobacco comment: 1 pk every 4 days.  Vaping Use  . Vaping Use: Never used  Substance and Sexual Activity  . Alcohol use: Not Currently  . Drug use: No  . Sexual activity: Yes  Other Topics Concern  . Not on file  Social History Narrative  . Not on file   Social Determinants of Health   Financial Resource Strain: Not on file  Food Insecurity: Not on file  Transportation Needs: Not on file  Physical Activity: Not on file  Stress: Not on file  Social Connections: Not on file    Family History: [name] family history includes Cancer in his brother; Heart attack in his mother; Hypertension in an other family member. There is no history of Adrenal disorder, Colon cancer, Rectal cancer, or Stomach cancer. No   ROS:   Please see the history of present illness.    All other systems reviewed and are negative.  EKGs/Labs/Other Studies Reviewed:    The following studies were reviewed today:  EKG:   08/07/20 SR 70 1st HB, LAFB, LVH with repol abnormality  Recent Labs: 09/29/2020: ALT 13 12/06/2020: BUN 50; Creatinine, Ser 15.64; Hemoglobin 9.0; Platelets 93; Potassium 4.6; Sodium 134  Recent Lipid Panel No results found for: CHOL, TRIG, HDL, CHOLHDL, VLDL, LDLCALC, LDLDIRECT   Cardiac Event Monitoring: Date: 11/16/20 Results:   Patient had a minimum heart rate of 78 bpm, maximum heart rate of 53 bpm, and average heart rate of 98 bpm.  Predominant underlying rhythm  was sinus rhythm.  Three runs of ventricular tachycardia occurred lasting 9 beats at longest.  Isolated PACs were rare (<1.0%).  Isolated PVCs were rare (<1.0%).  No evidence of complete heart block, thought 1st HB is present.  No atrial fibrillation.  Triggered and diary events associated with sinus rhythm.   Short runs of NSVT.   Transthoracic Echocardiogram: Date: 10/12/2020 Results: Improvement in EF and TR; thick heart CKD vs Amyloid 1. Inferior and posterior lateral wall hypokinesis Low normal GLS -14.9  with apical sparing Although speckled myocardial pattern may be seen with  CRF strain pattern suggest possible need for amyloid w/u . Left  ventricular ejection fraction, by  estimation, is 45 to 50%. The left ventricle has  mildly decreased  function. The left ventricle demonstrates regional wall motion  abnormalities (see scoring diagram/findings for description). The left  ventricular internal cavity size was moderately  dilated. There is moderate left ventricular hypertrophy. Left ventricular  diastolic parameters were normal.  2. Right ventricular systolic function is normal. The right ventricular  size is normal.  3. Left atrial size was moderately dilated.  4. ? catheter in RA.  5. The pericardial effusion is posterior to the left ventricle and  lateral to the left ventricle.  6. The mitral valve is normal in structure. Mild mitral valve  regurgitation. No evidence of mitral stenosis.  7. Tricuspid valve regurgitation is moderate.  8. The aortic valve is tricuspid. Aortic valve regurgitation is not  visualized. Mild aortic valve sclerosis is present, with no evidence of  aortic valve stenosis.  9. Aortic dilatation noted. There is mild dilatation of the aortic root,  measuring 42 mm.  10. The inferior vena cava is normal in size with greater than 50%  respiratory variability, suggesting right atrial pressure of 3 mmHg.  NonCardiac  CT: Date:01/13/2018 Results: - personally reviewed:  AA 37 mm; no coronary or aortic calcifications.  Physical Exam:    VS:  There were no vitals taken for this visit.    Wt Readings from Last 3 Encounters:  12/06/20 135 lb (61.2 kg)  11/23/20 130 lb (59 kg)  11/16/20 131 lb (59.4 kg)    GEN: Well nourished, well developed in no acute distress HEENT: Normal NECK: No JVD; No carotid bruits LYMPHATICS: No lymphadenopathy CARDIAC: RRR, no TR murmurs, no rubs, gallops RESPIRATORY:  Clear to auscultation without rales, wheezing or rhonchi  ABDOMEN: Soft, non-tender, non-distended MUSCULOSKELETAL:  No edema; No deformity  SKIN: Warm and dry NEUROLOGIC:  Alert and oriented x 3 PSYCHIATRIC:  Normal affect   ASSESSMENT:    No diagnosis found. PLAN:    In order of problems listed above:  Preoperative Risk Assessment - The Revised Cardiac Risk Index = 2 CHF, Scr >2), which equates to 2=6.6% moderate estimated risk of perioperative myocardial infarction, pulmonary edema, ventricular fibrillation, cardiac arrest, or complete heart block; patient understands risk and is amenable to testing - DASI score of 19 associated with 5.07 functional mets - No further cardiac testing is recommended prior to surgery.  - The patient may proceed to surgery at acceptable risk.   - Our service is available as needed in the peri-operative period.    Heart Failure Reduced Ejection Fraction EF 45% ESRD Moderate Tricuspid regurgitation HTN - NYHA class I, Stage B, euvolemic, etiology unclear - Diuretic regimen: hold after  - Strict I/Os, daily weights, and fluid restriction of < 2 L  - Replace electrolytes PRN and keep K>4 and Mg>2. - Coreg 25 mg BID  - ARB/ACEi allergy - hydralazine 100mg  TID - Will trial isordil 10 mg TID - asymptomatic; getting CT scan below; will discuss NM Stress based on results  Asymptomatic thoracic aortic aneurysm Chart review history of aortitis not confirmed with review  of old records - Last at,4.2 cm  rate of growth unclaer, indexed diameter 2.42 - Will need CT Angio Aorta - 1st degree relative one time screening - Discussed not using Fluoroquinolones -  no history of syphilis so deferring TPHA, micro-haemagglutination test, flourescent treponemal antibody absorption test and no 21 days of therapy with benzathine penicillin after surgery would be required; age < 30 and no HIV history unlesss thickened aorta  Tobacco Abuse- down to 4 a  day - given ESRD defer Chatnix at this time - give QTc 511, will defer bupropion at this time - will try gum - discussed the dangers of tobacco use, both inhaled and oral, which include, but are not limited to cardiovascular disease, increased cancer risk of multiple types of cancer, COPD, peripheral arterial disease, strokes. - counseled on the benefits of smoking cessation. - firmly advised to quit.  - we also reviewed strategies to maximize success, including:  Removing cigarettes and smoking materials from environment   Stress management  Substitution of other forms of reinforcement (the one cigarette a day approach)  Support of family/friends and group smoking cessation  Selecting a quit date  Patient provided contact information for [employer] or 1-800-QUIT-NOW  Patient provided with [employer] free smoking cessation classes: [phone] [phone] and [employer]fr    2-3 month follow up unless new symptoms or abnormal test results warranting change in plan  Would be reasonable for  APP Follow up   Shared Decision Making/Informed Consent       Medication Adjustments/Labs and Tests Ordered: Current medicines are reviewed at length with the patient today.  Concerns regarding medicines are outlined above.  No orders of the defined types were placed in this encounter.  No orders of the defined types were placed in this encounter.   There are no Patient Instructions on file for this visit.    Signed, Werner Lean, MD  01/25/2021 1:27 PM    [facility]

## 2021-02-01 ENCOUNTER — Other Ambulatory Visit: Payer: Self-pay

## 2021-02-01 ENCOUNTER — Ambulatory Visit (INDEPENDENT_AMBULATORY_CARE_PROVIDER_SITE_OTHER): Payer: Medicare Other | Admitting: Endocrinology

## 2021-02-01 VITALS — BP 136/80 | HR 75 | Ht 68.0 in | Wt 131.0 lb

## 2021-02-01 DIAGNOSIS — N186 End stage renal disease: Secondary | ICD-10-CM | POA: Diagnosis not present

## 2021-02-01 DIAGNOSIS — E559 Vitamin D deficiency, unspecified: Secondary | ICD-10-CM | POA: Diagnosis not present

## 2021-02-01 DIAGNOSIS — D6959 Other secondary thrombocytopenia: Secondary | ICD-10-CM | POA: Diagnosis not present

## 2021-02-01 DIAGNOSIS — Z125 Encounter for screening for malignant neoplasm of prostate: Secondary | ICD-10-CM | POA: Diagnosis not present

## 2021-02-01 DIAGNOSIS — E2740 Unspecified adrenocortical insufficiency: Secondary | ICD-10-CM

## 2021-02-01 DIAGNOSIS — I132 Hypertensive heart and chronic kidney disease with heart failure and with stage 5 chronic kidney disease, or end stage renal disease: Secondary | ICD-10-CM | POA: Diagnosis not present

## 2021-02-01 DIAGNOSIS — Z0001 Encounter for general adult medical examination with abnormal findings: Secondary | ICD-10-CM | POA: Diagnosis not present

## 2021-02-01 DIAGNOSIS — D631 Anemia in chronic kidney disease: Secondary | ICD-10-CM | POA: Diagnosis not present

## 2021-02-01 DIAGNOSIS — Z7189 Other specified counseling: Secondary | ICD-10-CM | POA: Diagnosis not present

## 2021-02-01 DIAGNOSIS — J309 Allergic rhinitis, unspecified: Secondary | ICD-10-CM | POA: Diagnosis not present

## 2021-02-01 DIAGNOSIS — I700 Atherosclerosis of aorta: Secondary | ICD-10-CM | POA: Diagnosis not present

## 2021-02-01 DIAGNOSIS — E785 Hyperlipidemia, unspecified: Secondary | ICD-10-CM | POA: Diagnosis not present

## 2021-02-01 NOTE — Progress Notes (Signed)
Subjective:    Patient ID: Marvin Roberson, male    DOB: [birth year], 50 y.o.   MRN: [id number]  HPI Pt is referred by Dr Hollie Salk, for Addison's Disease (dx'ed approx 2010, when he lived in [place]).  no h/o abdominal or brain injury.  No h/o cancer, seizures, amyloidosis, tuberculosis, or diabetes.  He has been on cortef since dx, but he takes just 5 mg qd.  No h/o ketoconazole, rifampin, or dilantin.  He receives HD for ESRD (due to HTN).  Main symptom is fatigue.   Past Medical History:  Diagnosis Date  . Adrenal insufficiency ([place])   . Allergy   . Anemia   . Anxiety   . Depression   . Dyspnea    "when I have too much fluid"  . ESRD on dialysis [facility]) since 1990s   "M,W,F; [address]" (11/27/2016)  . GERD (gastroesophageal reflux disease)   . Heart failure with reduced ejection fraction ([place])   . High cholesterol   . History of blood transfusion   . Hypertension   . Kidney failure   . Renal insufficiency   . Stroke ([place]) 2016   decreased vision in his left eye/notes 11/27/2016  . Tricuspid regurgitation     Past Surgical History:  Procedure Laterality Date  . ANGIOPLASTY Left 12/11/2016   Procedure: ANGIOPLASTY LEFT ARM & SUBCLAVIAN ARTERY;  Surgeon: Conrad [place], MD;  Location: [place];  Service: Vascular;  Laterality: Left;  . arm surgery Left 2016   "for aneurysm"  . AV FISTULA PLACEMENT    . FALSE ANEURYSM REPAIR Left 12/11/2016   Procedure: RESECTION  BRACHIAL ARTERY;  Surgeon: Conrad [place], MD;  Location: [place];  Service: Vascular;  Laterality: Left;  . FALSE ANEURYSM REPAIR Left 01/14/2018   Procedure: REPAIR FALSE ANEURYSM LEFT BRACHIAL ARTERY WITH SAPHENOUS VEIN  LEFT ARTERIOVENOUS GRAFT;  Surgeon: Rosetta Posner, MD;  Location: [place];  Service: Vascular;  Laterality: Left;  . INGUINAL HERNIA REPAIR Left 11/23/2020   Procedure: LAPAROSCOPIC LEFT INGUINAL HERNIA REPAIR WITH MESH;  Surgeon: Ralene Ok, MD;  Location: [place];  Service: General;  Laterality: Left;  . INSERTION OF  DIALYSIS CATHETER Right    chest  . IR FLUORO GUIDE CV LINE RIGHT  11/17/2019  . THROMBECTOMY BRACHIAL ARTERY  12/11/2016   Procedure: THROMBECTOMY BRACHIAL ARTERY AND ULNAR;  Surgeon: Conrad [place], MD;  Location: [place];  Service: Vascular;;  . THYROIDECTOMY, PARTIAL    . UPPER EXTREMITY ANGIOGRAM Left 12/11/2016   Procedure: LEFT ARM ANGIOGRAM;  Surgeon: Conrad Kelley, MD;  Location: [place];  Service: Vascular;  Laterality: Left;  Marland Kitchen VEIN HARVEST Left 01/14/2018   Procedure: VEIN HARVEST LEFT SAPHENOUS VEIN;  Surgeon: Rosetta Posner, MD;  Location: [place];  Service: Vascular;  Laterality: Left;  . WOUND EXPLORATION Left 12/11/2016   Procedure: LEFT ARM BRACHIAL ARTERY WITH INTERPOSTIONAL GRAFT;  Surgeon: Conrad Caballo, MD;  Location: [place];  Service: Vascular;  Laterality: Left;    Social History   Socioeconomic History  . Marital status: Single    Spouse name: Not on file  . Number of children: Not on file  . Years of education: Not on file  . Highest education level: Not on file  Occupational History  . Not on file  Tobacco Use  . Smoking status: Current Every Day Smoker    Packs/day: 0.25    Years: 20.00    Pack years: 5.00    Types:  Cigarettes  . Smokeless tobacco: Never Used  . Tobacco comment: 1 pk every 4 days.  Vaping Use  . Vaping Use: Never used  Substance and Sexual Activity  . Alcohol use: Not Currently  . Drug use: No  . Sexual activity: Yes  Other Topics Concern  . Not on file  Social History Narrative  . Not on file   Social Determinants of Health   Financial Resource Strain: Not on file  Food Insecurity: Not on file  Transportation Needs: Not on file  Physical Activity: Not on file  Stress: Not on file  Social Connections: Not on file  Intimate Partner Violence: Not on file    Current Outpatient Medications on File Prior to Visit  Medication Sig Dispense Refill  . amLODipine (NORVASC) 10 MG tablet Take 10 mg by mouth at bedtime.     Marland Kitchen atorvastatin (LIPITOR)  40 MG tablet Take 40 mg by mouth at bedtime.     . calcium acetate (PHOSLO) 667 MG capsule Take 1,334 mg by mouth 3 (three) times daily with meals.    . carvedilol (COREG) 25 MG tablet Take 25 mg by mouth 2 (two) times daily with a meal.    . guaiFENesin-dextromethorphan (ROBITUSSIN DM) 100-10 MG/5ML syrup Take 10 mLs by mouth every 4 (four) hours as needed for cough. 118 mL 0  . hydrALAZINE (APRESOLINE) 100 MG tablet Take 100 mg by mouth 3 (three) times daily.    . hydrocortisone (CORTEF) 5 MG tablet Take 5 mg by mouth daily.     . isosorbide dinitrate (ISORDIL) 10 MG tablet Take 1 tablet (10 mg total) by mouth 3 (three) times daily. 90 tablet 6  . traMADol (ULTRAM) 50 MG tablet Take 1 tablet (50 mg total) by mouth every 6 (six) hours as needed. 20 tablet 0   No current facility-administered medications on file prior to visit.    Allergies  Allergen Reactions  . Doxycycline Shortness Of Breath  . Codeine Nausea And Vomiting  . Gabapentin Other (See Comments)    Sleepiness, altered mental state  . Lisinopril Other (See Comments) and Swelling    angioedema  . Povidone-Iodine Itching  . Betadine [Povidone Iodine] Rash    Family History  Problem Relation Age of Onset  . Hypertension Other   . Cancer Brother        in leg and lungs  . Heart attack Mother   . Adrenal disorder Neg Hx   . Colon cancer Neg Hx   . Rectal cancer Neg Hx   . Stomach cancer Neg Hx     BP 136/80 (BP Location: Right Arm, Patient Position: Sitting, Cuff Size: Normal)   Pulse 75   Ht 5\' 8"  (1.727 m)   Wt 131 lb (59.4 kg)   SpO2 98%   BMI 19.92 kg/m     Review of Systems Denies weight loss, dizziness, n/v, abd pain, cold intolerance.      Objective:   Physical Exam VS: see vs page GEN: no distress HEAD: head: no deformity eyes: no periorbital swelling, no proptosis external nose and ears are normal NECK: a healed scar is present.  I do not appreciate a nodule in the thyroid or elsewhere in the  neck.   CHEST WALL: no deformity LUNGS: clear to auscultation CV: reg rate and rhythm, no murmur.  MUSCULOSKELETAL: gait is normal and steady EXTEMITIES: no leg edema NEURO:  readily moves all 4's.  sensation is intact to touch on all 4's SKIN:  Normal texture and temperature.  No rash or suspicious lesion is visible.   NODES:  None palpable at the neck PSYCH: alert, well-oriented.  Does not appear anxious nor depressed.    CT (2021) adrenals are normal  I have reviewed outside records, and summarized: Pt was noted to be on chronic rx with cortef, and referred here.  He was hospitalized for COVID, and was rx'ed with steroids then.        Assessment & Plan:  Adrenal insuff, new to me.  As he clinically stable on a low dosage, dx is in question  ESRD: HD can mitigate sxs and electrolyte abnormalities.    Patient Instructions  Please continue the same hydrocortisone. Next Tuesday, 02/06/21, do not take it before you come here.  please come to the lab, to do "before and after" blood tests (also called "ACTH stimulation" test).   Then take the pill.   We'll let you know about the results.

## 2021-02-01 NOTE — Patient Instructions (Signed)
Please continue the same hydrocortisone. Next Tuesday, 02/06/21, do not take it before you come here.  please come to the lab, to do "before and after" blood tests (also called "ACTH stimulation" test).   Then take the pill.   We'll let you know about the results.

## 2021-02-02 DIAGNOSIS — D509 Iron deficiency anemia, unspecified: Secondary | ICD-10-CM | POA: Diagnosis not present

## 2021-02-02 DIAGNOSIS — Z992 Dependence on renal dialysis: Secondary | ICD-10-CM | POA: Diagnosis not present

## 2021-02-02 DIAGNOSIS — N186 End stage renal disease: Secondary | ICD-10-CM | POA: Diagnosis not present

## 2021-02-02 DIAGNOSIS — N2581 Secondary hyperparathyroidism of renal origin: Secondary | ICD-10-CM | POA: Diagnosis not present

## 2021-02-02 DIAGNOSIS — I129 Hypertensive chronic kidney disease with stage 1 through stage 4 chronic kidney disease, or unspecified chronic kidney disease: Secondary | ICD-10-CM | POA: Diagnosis not present

## 2021-02-02 DIAGNOSIS — D631 Anemia in chronic kidney disease: Secondary | ICD-10-CM | POA: Diagnosis not present

## 2021-02-02 DIAGNOSIS — E875 Hyperkalemia: Secondary | ICD-10-CM | POA: Diagnosis not present

## 2021-02-06 ENCOUNTER — Ambulatory Visit: Payer: Medicare Other

## 2021-02-06 ENCOUNTER — Other Ambulatory Visit: Payer: Medicare Other

## 2021-02-28 DIAGNOSIS — M1040 Other secondary gout, unspecified site: Secondary | ICD-10-CM | POA: Diagnosis not present

## 2021-03-04 DIAGNOSIS — N186 End stage renal disease: Secondary | ICD-10-CM | POA: Diagnosis not present

## 2021-03-04 DIAGNOSIS — I129 Hypertensive chronic kidney disease with stage 1 through stage 4 chronic kidney disease, or unspecified chronic kidney disease: Secondary | ICD-10-CM | POA: Diagnosis not present

## 2021-03-04 DIAGNOSIS — Z992 Dependence on renal dialysis: Secondary | ICD-10-CM | POA: Diagnosis not present

## 2021-03-05 DIAGNOSIS — Z992 Dependence on renal dialysis: Secondary | ICD-10-CM | POA: Diagnosis not present

## 2021-03-05 DIAGNOSIS — D631 Anemia in chronic kidney disease: Secondary | ICD-10-CM | POA: Diagnosis not present

## 2021-03-05 DIAGNOSIS — N2581 Secondary hyperparathyroidism of renal origin: Secondary | ICD-10-CM | POA: Diagnosis not present

## 2021-03-05 DIAGNOSIS — E875 Hyperkalemia: Secondary | ICD-10-CM | POA: Diagnosis not present

## 2021-03-05 DIAGNOSIS — N186 End stage renal disease: Secondary | ICD-10-CM | POA: Diagnosis not present

## 2021-03-06 ENCOUNTER — Other Ambulatory Visit: Payer: Self-pay

## 2021-03-06 ENCOUNTER — Ambulatory Visit (INDEPENDENT_AMBULATORY_CARE_PROVIDER_SITE_OTHER): Payer: Medicare Other | Admitting: Podiatry

## 2021-03-06 ENCOUNTER — Encounter: Payer: Self-pay | Admitting: Podiatry

## 2021-03-06 ENCOUNTER — Ambulatory Visit (INDEPENDENT_AMBULATORY_CARE_PROVIDER_SITE_OTHER): Payer: Medicare Other

## 2021-03-06 DIAGNOSIS — M778 Other enthesopathies, not elsewhere classified: Secondary | ICD-10-CM

## 2021-03-06 DIAGNOSIS — B351 Tinea unguium: Secondary | ICD-10-CM | POA: Diagnosis not present

## 2021-03-06 DIAGNOSIS — M79676 Pain in unspecified toe(s): Secondary | ICD-10-CM

## 2021-03-06 NOTE — Progress Notes (Signed)
He presents today to our office having not seen [place] for a couple of years with a chief concern of swelling to his left foot.  He states that those last 3 to 4 days he really could not walk but does not know if it has to do with gout or not.  States that he is on hemodialysis and is also requesting nail care.  Objective: Vital signs are stable he is alert and oriented x3.  Pulses are palpable.  No erythematous mild edema across the forefoot dorsally that is tender on palpation.  Is not warm to the touch.  Neurologic sensorium is otherwise intact deep to reflexes are intact muscle tone is normal symmetrical.  Toenails are long thick yellow dystrophic onychomycotic painful palpation as well as debridement.  Radiographs taken today normal osseous architecture left foot.  No acute findings are noted.  Assessment: Pain in limb secondary to onychomycosis.  Edema left foot.  Cannot rule out stress fracture left.  Plan: Discussed etiology pathology and surgical therapies debrided toenails 1 through 5 of the bilateral foot.  We will follow-up with him in 1 month for his left foot at which time another set of x-rays will be taken otherwise he will follow-up with Dr. Adah Perl for his nail debridement.

## 2021-04-03 ENCOUNTER — Telehealth: Payer: Self-pay | Admitting: Internal Medicine

## 2021-04-03 ENCOUNTER — Ambulatory Visit: Payer: Medicare Other | Admitting: Podiatry

## 2021-04-03 NOTE — Telephone Encounter (Signed)
Spoke with Dr. Gasper Sells about patient having shortness of breath and chest pain during dialyses and that Dr. Hollie Salk is requesting the patient be seen urgently.  Dr. Gasper Sells has an appointment at 4:40.  Spoke with patient and advised we had an open available urgent appointment at 4:40 and patient stated he was good and had no problems today.  Advised patient that the dialysis center called requesting he be seen by his cardiologist urgently.  Patient stated he didn't need an appointment he was okay.    Advised patient to go to the hospital if he has reoccuring chest pain or shortness of breath.  Patient acknowledged instructions.  Call placed to dialysis center to advise them the patient did not want to be seen.

## 2021-04-03 NOTE — Telephone Encounter (Signed)
Pt c/o Shortness Of Breath: STAT if SOB developed within the last 24 hours or pt is noticeably SOB on the phone  1. Are you currently SOB (can you hear that pt is SOB on the phone)? Patient at dislysis  2. How long have you been experiencing SOB? yesterday  3. Are you SOB when sitting or when up moving around? both  4. Are you currently experiencing any other symptoms? Chest pains    Patient is at Dialysis

## 2021-04-04 DIAGNOSIS — D631 Anemia in chronic kidney disease: Secondary | ICD-10-CM | POA: Diagnosis not present

## 2021-04-04 DIAGNOSIS — N2581 Secondary hyperparathyroidism of renal origin: Secondary | ICD-10-CM | POA: Diagnosis not present

## 2021-04-04 DIAGNOSIS — E875 Hyperkalemia: Secondary | ICD-10-CM | POA: Diagnosis not present

## 2021-04-04 DIAGNOSIS — I129 Hypertensive chronic kidney disease with stage 1 through stage 4 chronic kidney disease, or unspecified chronic kidney disease: Secondary | ICD-10-CM | POA: Diagnosis not present

## 2021-04-04 DIAGNOSIS — N186 End stage renal disease: Secondary | ICD-10-CM | POA: Diagnosis not present

## 2021-04-04 DIAGNOSIS — Z992 Dependence on renal dialysis: Secondary | ICD-10-CM | POA: Diagnosis not present

## 2021-04-08 ENCOUNTER — Emergency Department (HOSPITAL_COMMUNITY)
Admission: EM | Admit: 2021-04-08 | Discharge: 2021-04-09 | Disposition: A | Discharge status: Home / Self Care | Payer: Medicare Other | Attending: Emergency Medicine | Admitting: Emergency Medicine

## 2021-04-08 ENCOUNTER — Encounter (HOSPITAL_COMMUNITY): Payer: Self-pay | Admitting: Emergency Medicine

## 2021-04-08 ENCOUNTER — Emergency Department (HOSPITAL_COMMUNITY): Payer: Medicare Other

## 2021-04-08 ENCOUNTER — Other Ambulatory Visit: Payer: Self-pay

## 2021-04-08 DIAGNOSIS — Z79899 Other long term (current) drug therapy: Secondary | ICD-10-CM | POA: Insufficient documentation

## 2021-04-08 DIAGNOSIS — I5043 Acute on chronic combined systolic (congestive) and diastolic (congestive) heart failure: Secondary | ICD-10-CM | POA: Diagnosis not present

## 2021-04-08 DIAGNOSIS — Z8616 Personal history of COVID-19: Secondary | ICD-10-CM | POA: Insufficient documentation

## 2021-04-08 DIAGNOSIS — R0602 Shortness of breath: Secondary | ICD-10-CM | POA: Diagnosis not present

## 2021-04-08 DIAGNOSIS — E871 Hypo-osmolality and hyponatremia: Secondary | ICD-10-CM | POA: Diagnosis not present

## 2021-04-08 DIAGNOSIS — R42 Dizziness and giddiness: Secondary | ICD-10-CM | POA: Diagnosis not present

## 2021-04-08 DIAGNOSIS — I132 Hypertensive heart and chronic kidney disease with heart failure and with stage 5 chronic kidney disease, or end stage renal disease: Secondary | ICD-10-CM | POA: Diagnosis not present

## 2021-04-08 DIAGNOSIS — Z8673 Personal history of transient ischemic attack (TIA), and cerebral infarction without residual deficits: Secondary | ICD-10-CM | POA: Diagnosis not present

## 2021-04-08 DIAGNOSIS — N186 End stage renal disease: Secondary | ICD-10-CM | POA: Diagnosis not present

## 2021-04-08 DIAGNOSIS — F17210 Nicotine dependence, cigarettes, uncomplicated: Secondary | ICD-10-CM | POA: Diagnosis not present

## 2021-04-08 DIAGNOSIS — I2510 Atherosclerotic heart disease of native coronary artery without angina pectoris: Secondary | ICD-10-CM | POA: Insufficient documentation

## 2021-04-08 DIAGNOSIS — I5084 End stage heart failure: Secondary | ICD-10-CM | POA: Insufficient documentation

## 2021-04-08 DIAGNOSIS — I10 Essential (primary) hypertension: Secondary | ICD-10-CM

## 2021-04-08 DIAGNOSIS — R531 Weakness: Secondary | ICD-10-CM | POA: Diagnosis not present

## 2021-04-08 DIAGNOSIS — Z951 Presence of aortocoronary bypass graft: Secondary | ICD-10-CM | POA: Insufficient documentation

## 2021-04-08 DIAGNOSIS — E875 Hyperkalemia: Secondary | ICD-10-CM | POA: Diagnosis not present

## 2021-04-08 DIAGNOSIS — Z992 Dependence on renal dialysis: Secondary | ICD-10-CM | POA: Diagnosis not present

## 2021-04-08 DIAGNOSIS — J811 Chronic pulmonary edema: Secondary | ICD-10-CM | POA: Diagnosis not present

## 2021-04-08 LAB — COMPREHENSIVE METABOLIC PANEL
ALT: 15 U/L (ref 0–44)
AST: 26 U/L (ref 15–41)
Albumin: 3 g/dL — ABNORMAL LOW (ref 3.5–5.0)
Alkaline Phosphatase: 104 U/L (ref 38–126)
Anion gap: 12 (ref 5–15)
BUN: 46 mg/dL — ABNORMAL HIGH (ref 6–20)
CO2: 24 mmol/L (ref 22–32)
Calcium: 8.8 mg/dL — ABNORMAL LOW (ref 8.9–10.3)
Chloride: 96 mmol/L — ABNORMAL LOW (ref 98–111)
Creatinine, Ser: 10.8 mg/dL — ABNORMAL HIGH (ref 0.61–1.24)
GFR, Estimated: 5 mL/min — ABNORMAL LOW (ref >60)
Glucose, Bld: 79 mg/dL (ref 70–99)
Potassium: 5.7 mmol/L — ABNORMAL HIGH (ref 3.5–5.1)
Sodium: 132 mmol/L — ABNORMAL LOW (ref 135–145)
Total Bilirubin: 0.9 mg/dL (ref 0.3–1.2)
Total Protein: 8.5 g/dL — ABNORMAL HIGH (ref 6.5–8.1)

## 2021-04-08 LAB — CBC WITH DIFFERENTIAL/PLATELET
Abs Immature Granulocytes: 0.02 10*3/uL (ref 0.00–0.07)
Basophils Absolute: 0 10*3/uL (ref 0.0–0.1)
Basophils Relative: 1 %
Eosinophils Absolute: 0.5 10*3/uL (ref 0.0–0.5)
Eosinophils Relative: 8 %
HCT: 30.6 % — ABNORMAL LOW (ref 39.0–52.0)
Hemoglobin: 9.5 g/dL — ABNORMAL LOW (ref 13.0–17.0)
Immature Granulocytes: 0 %
Lymphocytes Relative: 19 %
Lymphs Abs: 1.1 10*3/uL (ref 0.7–4.0)
MCH: 28.9 pg (ref 26.0–34.0)
MCHC: 31 g/dL (ref 30.0–36.0)
MCV: 93 fL (ref 80.0–100.0)
Monocytes Absolute: 0.6 10*3/uL (ref 0.1–1.0)
Monocytes Relative: 9 %
Neutro Abs: 3.7 10*3/uL (ref 1.7–7.7)
Neutrophils Relative %: 63 %
Platelets: 145 10*3/uL — ABNORMAL LOW (ref 150–400)
RBC: 3.29 MIL/uL — ABNORMAL LOW (ref 4.22–5.81)
RDW: 21.9 % — ABNORMAL HIGH (ref 11.5–15.5)
WBC: 5.9 10*3/uL (ref 4.0–10.5)
nRBC: 0 % (ref 0.0–0.2)

## 2021-04-08 LAB — MAGNESIUM: Magnesium: 1.9 mg/dL (ref 1.7–2.4)

## 2021-04-08 MED ORDER — SODIUM ZIRCONIUM CYCLOSILICATE 10 G PO PACK
10.0000 g | PACK | Freq: Once | ORAL | Status: AC
  Administered 2021-04-08: 10 g via ORAL
  Filled 2021-04-08: qty 1

## 2021-04-08 MED ORDER — CARVEDILOL 12.5 MG PO TABS
25.0000 mg | ORAL_TABLET | Freq: Once | ORAL | Status: AC
  Administered 2021-04-09: 25 mg via ORAL
  Filled 2021-04-08: qty 2

## 2021-04-08 MED ORDER — HYDRALAZINE HCL 25 MG PO TABS
100.0000 mg | ORAL_TABLET | Freq: Once | ORAL | Status: AC
  Administered 2021-04-09: 100 mg via ORAL
  Filled 2021-04-08: qty 4

## 2021-04-08 MED ORDER — AMLODIPINE BESYLATE 5 MG PO TABS
10.0000 mg | ORAL_TABLET | Freq: Once | ORAL | Status: AC
  Administered 2021-04-09: 10 mg via ORAL
  Filled 2021-04-08: qty 2

## 2021-04-08 MED ORDER — ISOSORBIDE DINITRATE 10 MG PO TABS
10.0000 mg | ORAL_TABLET | Freq: Once | ORAL | Status: AC
  Administered 2021-04-09: 10 mg via ORAL
  Filled 2021-04-08: qty 1

## 2021-04-08 NOTE — ED Provider Notes (Signed)
[facility] EMERGENCY DEPARTMENT Provider Note   CSN: [id number] Arrival date & time: 04/08/21  1814     History Chief Complaint  Patient presents with  . Weakness  . Dizziness    Marvin Roberson is a 50 y.o. male presents to the Emergency Department complaining of gradual, persistent, progressively worsening lightheadedness onset around 7pm.  Pt reports he often feels like this when his potassium gets high.  Hx of ESRD on dialysis M, W, F.  Pt reports he has been fully compliant with his dialysis and medications.  Denies fevers, chills, headache, neck pain, chest pain, abd pain, N/V/D, weakness, syncope.  Pt reports mild SOB. Denies leg swelling. No aggravating or alleviating factors.  No known sick contacts.    The history is provided by the patient and medical records. No language interpreter was used.       Past Medical History:  Diagnosis Date  . Adrenal insufficiency ([place])   . Allergy   . Anemia   . Anxiety   . Depression   . Dyspnea    "when I have too much fluid"  . ESRD on dialysis [facility]) since 1990s   "M,W,F; [address]" (11/27/2016)  . GERD (gastroesophageal reflux disease)   . Heart failure with reduced ejection fraction ([place])   . High cholesterol   . History of blood transfusion   . Hypertension   . Kidney failure   . Renal insufficiency   . Stroke ([place]) 2016   decreased vision in his left eye/notes 11/27/2016  . Tricuspid regurgitation     Patient Active Problem List   Diagnosis Date Noted  . Other fatigue 01/08/2021  . Other fluid overload 12/19/2020  . Preop cardiovascular exam 11/16/2020  . HFrEF (heart failure with reduced ejection fraction) ([place]) 11/16/2020  . Thoracic aortic aneurysm without rupture ([place]) 11/16/2020  . Nonrheumatic tricuspid valve regurgitation 11/16/2020  . Tobacco abuse 11/16/2020  . Chills (without fever) 10/09/2020  . Syncope and collapse 09/19/2020  . Acute on chronic combined systolic and diastolic CHF  (congestive heart failure) ([place]) 09/19/2020  . Essential hypertension 09/19/2020  . Allergy, unspecified, initial encounter 07/17/2020  . Anaphylactic shock, unspecified, initial encounter 07/17/2020  . COVID-19 06/19/2020  . Pneumonia due to COVID-19 virus 06/13/2020  . Hepatic cirrhosis ([place]) 03/09/2020  . Encounter for other preprocedural examination 03/09/2020  . Fracture of metacarpal bone 02/11/2020  . Pain of left hand 02/10/2020  . Hyperglycemia, unspecified 05/31/2019  . Dysuria 05/24/2019  . Chronic nonintractable headache 01/28/2019  . Thrombocytopenia ([place]) 08/13/2018  . ESRD on dialysis ([place])   . ESRD (end stage renal disease) ([place]) 01/14/2018  . Hyperkalemia, diminished renal excretion 10/20/2017  . Dependence on renal dialysis ([place]) 10/08/2017  . Post-operative complication 67/61/9509  . Bacteremia 12/13/2016  . 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 ESRD on hemodialysis ([place])   . 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[place]) 12/11/2016  . Mild protein-calorie malnutrition ([place]) 12/06/2016  . Hypocalcemia 11/30/2016  . Adrenal insufficiency ([place])   . Hypoglycemia due to insulin 11/27/2016  . Hypertension 11/27/2016  . History of CVA (cerebrovascular accident) 11/27/2016  . Monoclonal gammopathy of unknown significance (MGUS) 11/27/2016  . Anemia in chronic kidney disease 11/20/2016  . Coagulation defect, unspecified ([place]) 11/20/2016  . Complication of vascular dialysis catheter 11/20/2016  . Iron deficiency anemia, unspecified 11/20/2016  . Pruritus, unspecified 11/20/2016  . Secondary hyperparathyroidism of renal origin ([place]) 11/20/2016  . Shortness of breath 11/20/2016  . Bradycardia 02/26/2016  . Hypertensive  cardiomyopathy ([place]) 01/22/2016  . Hypertensive kidney disease with ESRD (end-stage renal disease) ([place]) 01/22/2016  . Staphylococcus aureus bacteremia 01/22/2016  . Hydrocele 12/01/2015  . CAD (coronary artery disease) 07/24/2015  . Injury of median nerve at left  upper arm level 07/13/2015  . Anticoagulation goal of INR 2 to 3 07/05/2014  . Avascular necrosis of femoral head ([place]) 07/05/2014  . Intestinal occlusion ([place]) 07/05/2014  . Weakness of both hips 07/05/2014  . Anemia, chronic disease 03/18/2014  . Hyponatremia 03/18/2014  . Retroperitoneal lymphadenopathy 03/18/2014  . Weight loss 03/03/2014  . Sepsis ([place]) 03/30/2013  . Migraine headache 10/11/2012  . Pulmonary embolism ([place]) 10/06/2012  . DVT of upper extremity (deep vein thrombosis) ([place]) 10/05/2012  . Cellulitis of left arm 10/04/2012    Past Surgical History:  Procedure Laterality Date  . ANGIOPLASTY Left 12/11/2016   Procedure: ANGIOPLASTY LEFT ARM & SUBCLAVIAN ARTERY;  Surgeon: Conrad Dickson, MD;  Location: [facility];  Service: Vascular;  Laterality: Left;  . arm surgery Left 2016   "for aneurysm"  . AV FISTULA PLACEMENT    . FALSE ANEURYSM REPAIR Left 12/11/2016   Procedure: RESECTION  BRACHIAL ARTERY;  Surgeon: Conrad Mamers, MD;  Location: [facility];  Service: Vascular;  Laterality: Left;  . FALSE ANEURYSM REPAIR Left 01/14/2018   Procedure: REPAIR FALSE ANEURYSM LEFT BRACHIAL ARTERY WITH SAPHENOUS VEIN  LEFT ARTERIOVENOUS GRAFT;  Surgeon: Rosetta Posner, MD;  Location: [facility];  Service: Vascular;  Laterality: Left;  . INGUINAL HERNIA REPAIR Left 11/23/2020   Procedure: LAPAROSCOPIC LEFT INGUINAL HERNIA REPAIR WITH MESH;  Surgeon: Ralene Ok, MD;  Location: [facility];  Service: General;  Laterality: Left;  . INSERTION OF DIALYSIS CATHETER Right    chest  . IR FLUORO GUIDE CV LINE RIGHT  11/17/2019  . THROMBECTOMY BRACHIAL ARTERY  12/11/2016   Procedure: THROMBECTOMY BRACHIAL ARTERY AND ULNAR;  Surgeon: Conrad Shippenville, MD;  Location: [facility];  Service: Vascular;;  . THYROIDECTOMY, PARTIAL    . UPPER EXTREMITY ANGIOGRAM Left 12/11/2016   Procedure: LEFT ARM ANGIOGRAM;  Surgeon: Conrad Du Pont, MD;  Location: [facility];  Service: Vascular;  Laterality: Left;  Marland Kitchen VEIN HARVEST Left 01/14/2018   Procedure:  VEIN HARVEST LEFT SAPHENOUS VEIN;  Surgeon: Rosetta Posner, MD;  Location: [facility];  Service: Vascular;  Laterality: Left;  . WOUND EXPLORATION Left 12/11/2016   Procedure: LEFT ARM BRACHIAL ARTERY WITH INTERPOSTIONAL GRAFT;  Surgeon: Conrad [place], MD;  Location: [facility];  Service: Vascular;  Laterality: Left;       Family History  Problem Relation Age of Onset  . Hypertension Other   . Cancer Brother        in leg and lungs  . Heart attack Mother   . Adrenal disorder Neg Hx   . Colon cancer Neg Hx   . Rectal cancer Neg Hx   . Stomach cancer Neg Hx     Social History   Tobacco Use  . Smoking status: Current Every Day Smoker    Packs/day: 0.25    Years: 20.00    Pack years: 5.00    Types: Cigarettes  . Smokeless tobacco: Never Used  . Tobacco comment: 1 pk every 4 days.  Vaping Use  . Vaping Use: Never used  Substance Use Topics  . Alcohol use: Not Currently  . Drug use: No    Home Medications Prior to Admission medications   Medication Sig Start Date End Date Taking? Authorizing  Provider  amLODipine (NORVASC) 10 MG tablet Take 10 mg by mouth at bedtime.  01/07/19   [provider]  atorvastatin (LIPITOR) 40 MG tablet Take 40 mg by mouth at bedtime.  01/27/16   [provider]  B Complex-C-Zn-Folic Acid (DIALYVITE 656 WITH ZINC) 0.8 MG TABS Take 1 tablet by mouth daily. 01/22/21   [provider]  calcium acetate (PHOSLO) 667 MG capsule Take 1,334 mg by mouth 3 (three) times daily with meals. 01/08/19   [provider]  carvedilol (COREG) 25 MG tablet Take 25 mg by mouth 2 (two) times daily with a meal.    [provider]  guaiFENesin-dextromethorphan (ROBITUSSIN DM) 100-10 MG/5ML syrup Take 10 mLs by mouth every 4 (four) hours as needed for cough. 06/16/20   Mosetta Anis, MD  hydrALAZINE (APRESOLINE) 100 MG tablet Take 100 mg by mouth 3 (three) times daily.    [provider]  hydrocortisone (CORTEF) 5 MG tablet Take 5 mg by mouth  daily.     [provider]  isosorbide dinitrate (ISORDIL) 10 MG tablet Take 1 tablet (10 mg total) by mouth 3 (three) times daily. 11/16/20   Werner Lean, MD  Methoxy PEG-Epoetin Beta (MIRCERA IJ) Mircera 01/15/21 01/14/22  [provider]  pantoprazole (PROTONIX) 40 MG tablet Take 1 tablet by mouth daily. 12/25/20   [provider]  traMADol (ULTRAM) 50 MG tablet Take 1 tablet (50 mg total) by mouth every 6 (six) hours as needed. 11/23/20 11/23/21  Ralene Ok, MD    Allergies    Doxycycline, Codeine, Gabapentin, Lisinopril, Povidone-iodine, and Betadine [povidone iodine]  Review of Systems   Review of Systems  Constitutional: Negative for appetite change, diaphoresis, fatigue, fever and unexpected weight change.  HENT: Negative for mouth sores.   Eyes: Negative for visual disturbance.  Respiratory: Positive for shortness of breath. Negative for cough, chest tightness and wheezing.   Cardiovascular: Negative for chest pain.  Gastrointestinal: Negative for abdominal pain, constipation, diarrhea, nausea and vomiting.  Endocrine: Negative for polydipsia, polyphagia and polyuria.  Genitourinary: Negative for dysuria, frequency, hematuria and urgency.  Musculoskeletal: Negative for back pain and neck stiffness.  Skin: Negative for rash.  Allergic/Immunologic: Negative for immunocompromised state.  Neurological: Positive for dizziness and weakness ( generalized). Negative for syncope, light-headedness and headaches.  Hematological: Does not bruise/bleed easily.  Psychiatric/Behavioral: Negative for sleep disturbance. The patient is not nervous/anxious.     Physical Exam Updated Vital Signs BP (!) 150/98 (BP Location: Right Arm)   Pulse 70   Temp 98.3 F (36.8 C)   Resp 18   SpO2 98%   Physical Exam Vitals and nursing note reviewed.  Constitutional:      General: He is not in acute distress.    Appearance: He is not diaphoretic.  HENT:      Head: Normocephalic.     Nose: Nose normal.     Mouth/Throat:     Mouth: Mucous membranes are moist.  Eyes:     General: No scleral icterus.    Conjunctiva/sclera: Conjunctivae normal.  Cardiovascular:     Rate and Rhythm: Normal rate and regular rhythm.     Pulses: Normal pulses.          Radial pulses are 2+ on the right side and 2+ on the left side.  Pulmonary:     Effort: Pulmonary effort is normal. No tachypnea, accessory muscle usage, prolonged expiration, respiratory distress or retractions.     Breath sounds: Normal  breath sounds. No stridor.     Comments: Equal chest rise. No increased work of breathing. Abdominal:     General: There is no distension.     Palpations: Abdomen is soft.     Tenderness: There is no abdominal tenderness. There is no guarding or rebound.  Musculoskeletal:        General: Normal range of motion.     Cervical back: Normal range of motion.     Right lower leg: No edema.     Left lower leg: No edema.     Comments: Moves all extremities equally and without difficulty.  Skin:    General: Skin is warm and dry.     Capillary Refill: Capillary refill takes less than 2 seconds.  Neurological:     Mental Status: He is alert.     GCS: GCS eye subscore is 4. GCS verbal subscore is 5. GCS motor subscore is 6.     Comments: Speech is clear and goal oriented.  Psychiatric:        Mood and Affect: Mood normal.     ED Results / Procedures / Treatments   Labs (all labs ordered are listed, but only abnormal results are displayed) Labs Reviewed  COMPREHENSIVE METABOLIC PANEL - Abnormal; Notable for the following components:      Result Value   Sodium 132 (*)    Potassium 5.7 (*)    Chloride 96 (*)    BUN 46 (*)    Creatinine, Ser 10.80 (*)    Calcium 8.8 (*)    Total Protein 8.5 (*)    Albumin 3.0 (*)    GFR, Estimated 5 (*)    All other components within normal limits  CBC WITH DIFFERENTIAL/PLATELET - Abnormal; Notable for the following  components:   RBC 3.29 (*)    Hemoglobin 9.5 (*)    HCT 30.6 (*)    RDW 21.9 (*)    Platelets 145 (*)    All other components within normal limits  MAGNESIUM    EKG EKG Interpretation  Date/Time:  Sunday April 08 2021 18:20:30 EDT Ventricular Rate:  79 PR Interval:  274 QRS Duration: 92 QT Interval:  410 QTC Calculation: 470 R Axis:   -75 Text Interpretation: Sinus rhythm with 1st degree A-V block Left axis deviation Pulmonary disease pattern Minimal voltage criteria for LVH, may be normal variant ( Cornell product ) Nonspecific T wave abnormality Prolonged QT Abnormal ECG Confirmed by Quintella Reichert [phone]) on 04/08/2021 10:11:11 PM   Radiology No results found.  Procedures Procedures   Medications Ordered in ED Medications  sodium zirconium cyclosilicate (LOKELMA) packet 10 g (10 g Oral Given 04/08/21 2325)  hydrALAZINE (APRESOLINE) tablet 100 mg (100 mg Oral Given 04/09/21 0002)  carvedilol (COREG) tablet 25 mg (25 mg Oral Given 04/09/21 0003)  amLODipine (NORVASC) tablet 10 mg (10 mg Oral Given 04/09/21 0002)  isosorbide dinitrate (ISORDIL) tablet 10 mg (10 mg Oral Given 04/09/21 0002)    ED Course  I have reviewed the triage vital signs and the nursing notes.  Pertinent labs & imaging results that were available during my care of the patient were reviewed by me and considered in my medical decision making (see chart for details).  Clinical Course as of 04/09/21 5643  Nancy Fetter Apr 08, 2021  2228 Potassium(!): 5.7 Noted  [HM]  2228 BP(!): 150/98 HTN today - hx of same [HM]  2305 Discussed with Dr. Jonnie Finner who does not feel the patient requires admission or  urgent dialysis at this time.  Will give Lokelma and reassess. [HM]    Clinical Course User Index [HM] Markel Kurtenbach, Jarrett Soho, PA-C   MDM Rules/Calculators/A&P                           Pt presents with lightheadedness, fatigue and generalized weakness.  Pt reports this happens when his potassium is high.  Mild SOB.  No  other symptoms.  Labs show hyperkalemia at 5.7.  Mild anemia hemoglobin at 9.5 - baseline for patient.  Creatinine noted at 10.8 however patient is due for dialysis.  Patient with severe hypertension with orthostatic vital signs however they are taking his blood pressure in his right leg.  Patient reports no change in symptoms with standing or moving.  No dyspnea on exertion.  Chest x-ray shows mild vascular congestion.  I personally evaluated these images.  EKG somewhat concerning with prolonged QT and LVH.  Reviewing records back through 2019 patient has had these EKG changes before.  Sometimes they are associated with hyperkalemia and sometimes they are not.  Discussed patient with Dr. Burnett Sheng who does not feel patient requires admission or urgent dialysis.  Given Lokelma and home blood pressure medications as he has not taken any prior to arrival.  Will reassess.  The patient was discussed with and evaluated by Dr. Ralene Bathe who agrees with the treatment plan.  12:31 AM Patient reports he is feeling better.  Reports his blood pressure normally runs significantly higher than noted tonight.  Wishes for discharge home.  Reports he will go to dialysis first thing in the morning.  Discussed reasons return to the emergency department.  Patient states understanding and is in agreement with the plan.  BP (!) 174/102   Pulse 67   Temp 98.1 F (36.7 C) (Oral)   Resp (!) 21   SpO2 100%     Final Clinical Impression(s) / ED Diagnoses Final diagnoses:  Hyperkalemia  Dizziness  Hypertension, unspecified type    Rx / DC Orders ED Discharge Orders    None       Aqil Goetting, Gwenlyn Perking 04/09/21 0053    Quintella Reichert, MD 04/13/21 Benancio Deeds

## 2021-04-08 NOTE — ED Provider Notes (Signed)
Emergency Medicine Provider Triage Evaluation Note  Marvin Roberson , a 50 y.o. male  was evaluated in triage.  Pt complains of feeling dizzy. He states that he has been on dialysis for many years and he notes that this is how he feels when he has potassium issues. He reports recently he has been compliant with dialysis, has not missed any sessions.  His symptoms started about 3 to 4 hours ago. He denies any known COVID contacts..  Review of Systems  Positive: Body wide weakness, lightheadedness, potassium is a Negative: Fever, cough, shortness of breath  Physical Exam  BP (!) 162/107 (BP Location: Right Arm)   Pulse 81   Temp 98.3 F (36.8 C)   Resp 18   SpO2 98%  Gen:   Awake, no distress   Resp:  Normal effort  MSK:   Moves extremities without difficulty  Other:  Speech is not slurred.  Answers questions appropriately.  No obvious distress.  Medical Decision Making  Medically screening exam initiated at 6:38 PM.  Appropriate orders placed.  Marvin Roberson was informed that the remainder of the evaluation will be completed by another provider, this initial triage assessment does not replace that evaluation, and the importance of remaining in the ED until their evaluation is complete.     Lorin Glass, [place] 04/08/21 1839    Valarie Merino, MD 04/08/21 1944

## 2021-04-08 NOTE — ED Triage Notes (Signed)
C/o generalized weakness and feeling lightheaded x 3 hours.  Last dialysis on Friday.  Denies pain.

## 2021-04-08 NOTE — ED Notes (Signed)
Patient transported to X-ray 

## 2021-04-09 DIAGNOSIS — E875 Hyperkalemia: Secondary | ICD-10-CM | POA: Diagnosis not present

## 2021-04-09 NOTE — Discharge Instructions (Addendum)
1. Medications: usual home medications 2. Treatment: rest, drink plenty of fluids, Go to dialysis tomorrow morning 3. Follow Up: Please followup with your primary doctor in 1-2 days for discussion of your diagnoses and further evaluation after today's visit; Please return to the ER for worsening symptoms, passing out, difficulty breathing or other concerns

## 2021-04-12 ENCOUNTER — Ambulatory Visit: Payer: Medicare Other | Admitting: Podiatry

## 2021-04-15 ENCOUNTER — Encounter (HOSPITAL_COMMUNITY): Payer: Self-pay | Admitting: *Deleted

## 2021-04-15 ENCOUNTER — Emergency Department (HOSPITAL_COMMUNITY)
Admission: EM | Admit: 2021-04-15 | Discharge: 2021-04-16 | Disposition: A | Discharge status: Home / Self Care | Payer: Medicare Other | Attending: Emergency Medicine | Admitting: Emergency Medicine

## 2021-04-15 ENCOUNTER — Emergency Department (HOSPITAL_COMMUNITY): Payer: Medicare Other

## 2021-04-15 ENCOUNTER — Other Ambulatory Visit: Payer: Self-pay

## 2021-04-15 DIAGNOSIS — I132 Hypertensive heart and chronic kidney disease with heart failure and with stage 5 chronic kidney disease, or end stage renal disease: Secondary | ICD-10-CM | POA: Insufficient documentation

## 2021-04-15 DIAGNOSIS — F17210 Nicotine dependence, cigarettes, uncomplicated: Secondary | ICD-10-CM | POA: Insufficient documentation

## 2021-04-15 DIAGNOSIS — Z8616 Personal history of COVID-19: Secondary | ICD-10-CM | POA: Insufficient documentation

## 2021-04-15 DIAGNOSIS — Z992 Dependence on renal dialysis: Secondary | ICD-10-CM | POA: Insufficient documentation

## 2021-04-15 DIAGNOSIS — I509 Heart failure, unspecified: Secondary | ICD-10-CM | POA: Insufficient documentation

## 2021-04-15 DIAGNOSIS — N186 End stage renal disease: Secondary | ICD-10-CM | POA: Diagnosis not present

## 2021-04-15 DIAGNOSIS — Z79899 Other long term (current) drug therapy: Secondary | ICD-10-CM | POA: Diagnosis not present

## 2021-04-15 DIAGNOSIS — E875 Hyperkalemia: Secondary | ICD-10-CM | POA: Diagnosis not present

## 2021-04-15 DIAGNOSIS — R42 Dizziness and giddiness: Secondary | ICD-10-CM | POA: Diagnosis not present

## 2021-04-15 LAB — BASIC METABOLIC PANEL
Anion gap: 11 (ref 5–15)
BUN: 46 mg/dL — ABNORMAL HIGH (ref 6–20)
CO2: 28 mmol/L (ref 22–32)
Calcium: 8.4 mg/dL — ABNORMAL LOW (ref 8.9–10.3)
Chloride: 94 mmol/L — ABNORMAL LOW (ref 98–111)
Creatinine, Ser: 10.16 mg/dL — ABNORMAL HIGH (ref 0.61–1.24)
GFR, Estimated: 6 mL/min — ABNORMAL LOW (ref >60)
Glucose, Bld: 82 mg/dL (ref 70–99)
Potassium: 5.4 mmol/L — ABNORMAL HIGH (ref 3.5–5.1)
Sodium: 133 mmol/L — ABNORMAL LOW (ref 135–145)

## 2021-04-15 LAB — CBC
HCT: 27.9 % — ABNORMAL LOW (ref 39.0–52.0)
Hemoglobin: 8.9 g/dL — ABNORMAL LOW (ref 13.0–17.0)
MCH: 30 pg (ref 26.0–34.0)
MCHC: 31.9 g/dL (ref 30.0–36.0)
MCV: 93.9 fL (ref 80.0–100.0)
Platelets: 118 10*3/uL — ABNORMAL LOW (ref 150–400)
RBC: 2.97 MIL/uL — ABNORMAL LOW (ref 4.22–5.81)
RDW: 21.7 % — ABNORMAL HIGH (ref 11.5–15.5)
WBC: 5.4 10*3/uL (ref 4.0–10.5)
nRBC: 0 % (ref 0.0–0.2)

## 2021-04-15 LAB — TROPONIN I (HIGH SENSITIVITY): Troponin I (High Sensitivity): 32 ng/L — ABNORMAL HIGH (ref <18)

## 2021-04-15 NOTE — ED Triage Notes (Signed)
The pt is c.o dizziness  for one hour    no pain dialysis last on Friday dialysis catheter in chest

## 2021-04-16 DIAGNOSIS — E875 Hyperkalemia: Secondary | ICD-10-CM | POA: Diagnosis not present

## 2021-04-16 LAB — TROPONIN I (HIGH SENSITIVITY): Troponin I (High Sensitivity): 33 ng/L — ABNORMAL HIGH (ref <18)

## 2021-04-16 MED ORDER — ATORVASTATIN CALCIUM 40 MG PO TABS
40.0000 mg | ORAL_TABLET | Freq: Once | ORAL | Status: AC
  Administered 2021-04-16: 40 mg via ORAL
  Filled 2021-04-16: qty 1

## 2021-04-16 MED ORDER — HYDRALAZINE HCL 50 MG PO TABS
100.0000 mg | ORAL_TABLET | Freq: Three times a day (TID) | ORAL | Status: DC
  Administered 2021-04-16: 100 mg via ORAL
  Filled 2021-04-16 (×2): qty 2

## 2021-04-16 MED ORDER — SODIUM ZIRCONIUM CYCLOSILICATE 5 G PO PACK
5.0000 g | PACK | Freq: Once | ORAL | Status: AC
  Administered 2021-04-16: 5 g via ORAL
  Filled 2021-04-16: qty 1

## 2021-04-16 MED ORDER — AMLODIPINE BESYLATE 5 MG PO TABS
10.0000 mg | ORAL_TABLET | Freq: Every day | ORAL | Status: DC
  Administered 2021-04-16: 10 mg via ORAL
  Filled 2021-04-16: qty 2

## 2021-04-16 NOTE — ED Notes (Signed)
Pt discharged and wheeled out of the ED in a wheel chair.

## 2021-04-16 NOTE — ED Provider Notes (Signed)
[facility] EMERGENCY DEPARTMENT Provider Note   CSN: [id number] Arrival date & time: 04/15/21  1842     History Chief Complaint  Patient presents with   Dizziness    Marvin Roberson is a 50 y.o. male.  Patient presents to the emergency department with a chief complaint of dizziness.  He states that he is feeling similar to how he felt a couple of weeks ago when he was seen for hyperkalemia.  He states that he felt like he has potassium was getting high again.  His last dialysis was on Friday.  He is scheduled for dialysis this morning.  He denies having shortness of breath right now.  Denies having any pain.  States that he did not take his evening blood pressure medication because he was at the hospital.  The history is provided by the patient. No language interpreter was used.      Past Medical History:  Diagnosis Date   Adrenal insufficiency (HCC)    Allergy    Anemia    Anxiety    Depression    Dyspnea    "when I have too much fluid"   ESRD on dialysis [facility]) since 1990s   "M,W,F; [address]" (11/27/2016)   GERD (gastroesophageal reflux disease)    Heart failure with reduced ejection fraction (HCC)    High cholesterol    History of blood transfusion    Hypertension    Kidney failure    Renal insufficiency    Stroke ([place]) 2016   decreased vision in his left eye/notes 11/27/2016   Tricuspid regurgitation     Patient Active Problem List   Diagnosis Date Noted   Other fatigue 01/08/2021   Other fluid overload 12/19/2020   Preop cardiovascular exam 11/16/2020   HFrEF (heart failure with reduced ejection fraction) ([place]) 11/16/2020   Thoracic aortic aneurysm without rupture ([place]) 11/16/2020   Nonrheumatic tricuspid valve regurgitation 11/16/2020   Tobacco abuse 11/16/2020   Chills (without fever) 10/09/2020   Syncope and collapse 09/19/2020   Acute on chronic combined systolic and diastolic CHF (congestive heart failure) ([place]) 09/19/2020    Essential hypertension 09/19/2020   Allergy, unspecified, initial encounter 07/17/2020   Anaphylactic shock, unspecified, initial encounter 07/17/2020   COVID-19 06/19/2020   Pneumonia due to COVID-19 virus 06/13/2020   Hepatic cirrhosis ([place]) 03/09/2020   Encounter for other preprocedural examination 03/09/2020   Fracture of metacarpal bone 02/11/2020   Pain of left hand 02/10/2020   Hyperglycemia, unspecified 05/31/2019   Dysuria 05/24/2019   Chronic nonintractable headache 01/28/2019   Thrombocytopenia ([place]) 08/13/2018   ESRD on dialysis ([place])    ESRD (end stage renal disease) ([place]) 01/14/2018   Hyperkalemia, diminished renal excretion 10/20/2017   Dependence on renal dialysis ([place]) 10/08/2017   Post-operative complication 01/74/9449   Bacteremia 12/13/2016   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[place])    ESRD on hemodialysis ([place])    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[place]) 12/11/2016   Mild protein-calorie malnutrition ([place]) 12/06/2016   Hypocalcemia 11/30/2016   Adrenal insufficiency ([place])    Hypoglycemia due to insulin 11/27/2016   Hypertension 11/27/2016   History of CVA (cerebrovascular accident) 11/27/2016   Monoclonal gammopathy of unknown significance (MGUS) 11/27/2016   Anemia in chronic kidney disease 11/20/2016   Coagulation defect, unspecified ([place]) 67/59/1638   Complication of vascular dialysis catheter 11/20/2016   Iron deficiency anemia, unspecified 11/20/2016   Pruritus, unspecified 11/20/2016   Secondary hyperparathyroidism of renal origin ([place]) 11/20/2016   Shortness of breath 11/20/2016  Bradycardia 02/26/2016   Hypertensive cardiomyopathy ([place]) 01/22/2016   Hypertensive kidney disease with ESRD (end-stage renal disease) ([place]) 01/22/2016   Staphylococcus aureus bacteremia 01/22/2016   Hydrocele 12/01/2015   CAD (coronary artery disease) 07/24/2015   Injury of median nerve at left upper arm level 07/13/2015   Anticoagulation goal of INR 2 to 3 07/05/2014   Avascular  necrosis of femoral head ([place]) 07/05/2014   Intestinal occlusion ([place]) 07/05/2014   Weakness of both hips 07/05/2014   Anemia, chronic disease 03/18/2014   Hyponatremia 03/18/2014   Retroperitoneal lymphadenopathy 03/18/2014   Weight loss 03/03/2014   Sepsis ([place]) 03/30/2013   Migraine headache 10/11/2012   Pulmonary embolism ([place]) 10/06/2012   DVT of upper extremity (deep vein thrombosis) ([place]) 10/05/2012   Cellulitis of left arm 10/04/2012    Past Surgical History:  Procedure Laterality Date   ANGIOPLASTY Left 12/11/2016   Procedure: ANGIOPLASTY LEFT ARM & SUBCLAVIAN ARTERY;  Surgeon: Conrad [place], MD;  Location: [place];  Service: Vascular;  Laterality: Left;   arm surgery Left 2016   "for aneurysm"   AV FISTULA PLACEMENT     FALSE ANEURYSM REPAIR Left 12/11/2016   Procedure: RESECTION  BRACHIAL ARTERY;  Surgeon: Conrad [place], MD;  Location: [facility];  Service: Vascular;  Laterality: Left;   FALSE ANEURYSM REPAIR Left 01/14/2018   Procedure: REPAIR FALSE ANEURYSM LEFT BRACHIAL ARTERY WITH SAPHENOUS VEIN  LEFT ARTERIOVENOUS GRAFT;  Surgeon: Rosetta Posner, MD;  Location: [place];  Service: Vascular;  Laterality: Left;   INGUINAL HERNIA REPAIR Left 11/23/2020   Procedure: LAPAROSCOPIC LEFT INGUINAL HERNIA REPAIR WITH MESH;  Surgeon: Ralene Ok, MD;  Location: [place];  Service: General;  Laterality: Left;   INSERTION OF DIALYSIS CATHETER Right    chest   IR FLUORO GUIDE CV LINE RIGHT  11/17/2019   THROMBECTOMY BRACHIAL ARTERY  12/11/2016   Procedure: THROMBECTOMY BRACHIAL ARTERY AND ULNAR;  Surgeon: Conrad [place], MD;  Location: [facility] OR;  Service: Vascular;;   THYROIDECTOMY, PARTIAL     UPPER EXTREMITY ANGIOGRAM Left 12/11/2016   Procedure: LEFT ARM ANGIOGRAM;  Surgeon: Conrad Roff, MD;  Location: [facility] OR;  Service: Vascular;  Laterality: Left;   VEIN HARVEST Left 01/14/2018   Procedure: VEIN HARVEST LEFT SAPHENOUS VEIN;  Surgeon: Rosetta Posner, MD;  Location: [facility] OR;  Service: Vascular;  Laterality:  Left;   WOUND EXPLORATION Left 12/11/2016   Procedure: LEFT ARM BRACHIAL ARTERY WITH INTERPOSTIONAL GRAFT;  Surgeon: Conrad [place], MD;  Location: [facility] OR;  Service: Vascular;  Laterality: Left;       Family History  Problem Relation Age of Onset   Hypertension Other    Cancer Brother        in leg and lungs   Heart attack Mother    Adrenal disorder Neg Hx    Colon cancer Neg Hx    Rectal cancer Neg Hx    Stomach cancer Neg Hx     Social History   Tobacco Use   Smoking status: Every Day    Packs/day: 0.25    Years: 20.00    Pack years: 5.00    Types: Cigarettes   Smokeless tobacco: Never   Tobacco comments:    1 pk every 4 days.  Vaping Use   Vaping Use: Never used  Substance Use Topics   Alcohol use: Not Currently   Drug use: No    Home Medications Prior to Admission medications   Medication Sig Start  Date End Date Taking? Authorizing Provider  amLODipine (NORVASC) 10 MG tablet Take 10 mg by mouth at bedtime.  01/07/19   [provider]  atorvastatin (LIPITOR) 40 MG tablet Take 40 mg by mouth at bedtime.  01/27/16   [provider]  B Complex-C-Zn-Folic Acid (DIALYVITE 250 WITH ZINC) 0.8 MG TABS Take 1 tablet by mouth daily. 01/22/21   [provider]  calcium acetate (PHOSLO) 667 MG capsule Take 1,334 mg by mouth 3 (three) times daily with meals. 01/08/19   [provider]  carvedilol (COREG) 25 MG tablet Take 25 mg by mouth 2 (two) times daily with a meal.    [provider]  guaiFENesin-dextromethorphan (ROBITUSSIN DM) 100-10 MG/5ML syrup Take 10 mLs by mouth every 4 (four) hours as needed for cough. 06/16/20   Mosetta Anis, MD  hydrALAZINE (APRESOLINE) 100 MG tablet Take 100 mg by mouth 3 (three) times daily.    [provider]  hydrocortisone (CORTEF) 5 MG tablet Take 5 mg by mouth daily.     [provider]  isosorbide dinitrate (ISORDIL) 10 MG tablet Take 1 tablet (10 mg total) by mouth 3 (three) times daily.  11/16/20   Werner Lean, MD  Methoxy PEG-Epoetin Beta (MIRCERA IJ) Mircera 01/15/21 01/14/22  [provider]  pantoprazole (PROTONIX) 40 MG tablet Take 1 tablet by mouth daily. 12/25/20   [provider]  traMADol (ULTRAM) 50 MG tablet Take 1 tablet (50 mg total) by mouth every 6 (six) hours as needed. 11/23/20 11/23/21  Ralene Ok, MD    Allergies    Doxycycline, Codeine, Gabapentin, Lisinopril, Povidone-iodine, and Betadine [povidone iodine]  Review of Systems   Review of Systems  All other systems reviewed and are negative.  Physical Exam Updated Vital Signs BP (!) 189/105 (BP Location: Left Arm)   Pulse (!) 59   Temp 98.3 F (36.8 C) (Oral)   Resp 18   Ht 5\' 8"  (1.727 m)   Wt 59 kg   SpO2 100%   BMI 19.77 kg/m   Physical Exam Vitals and nursing note reviewed.  Constitutional:      Appearance: He is well-developed.  HENT:     Head: Normocephalic and atraumatic.  Eyes:     Conjunctiva/sclera: Conjunctivae normal.  Cardiovascular:     Rate and Rhythm: Normal rate and regular rhythm.     Heart sounds: No murmur heard. Pulmonary:     Effort: Pulmonary effort is normal. No respiratory distress.     Breath sounds: Normal breath sounds.  Abdominal:     Palpations: Abdomen is soft.     Tenderness: There is no abdominal tenderness.  Musculoskeletal:        General: Normal range of motion.     Cervical back: Neck supple.  Skin:    General: Skin is warm and dry.  Neurological:     Mental Status: He is alert and oriented to person, place, and time.  Psychiatric:        Mood and Affect: Mood normal.        Behavior: Behavior normal.    ED Results / Procedures / Treatments   Labs (all labs ordered are listed, but only abnormal results are displayed) Labs Reviewed  BASIC METABOLIC PANEL - Abnormal; Notable for the following components:      Result Value   Sodium 133 (*)    Potassium 5.4 (*)    Chloride 94 (*)    BUN 46 (*)  Creatinine, Ser 10.16 (*)    Calcium 8.4 (*)    GFR, Estimated 6 (*)    All other components within normal limits  CBC - Abnormal; Notable for the following components:   RBC 2.97 (*)    Hemoglobin 8.9 (*)    HCT 27.9 (*)    RDW 21.7 (*)    Platelets 118 (*)    All other components within normal limits  TROPONIN I (HIGH SENSITIVITY) - Abnormal; Notable for the following components:   Troponin I (High Sensitivity) 32 (*)    All other components within normal limits  TROPONIN I (HIGH SENSITIVITY)  TROPONIN I (HIGH SENSITIVITY)    EKG None  Radiology DG Chest 2 View  Result Date: 04/15/2021 CLINICAL DATA:  Dizziness and blurred vision. EXAM: CHEST - 2 VIEW COMPARISON:  04/08/2021 FINDINGS: Stable mild cardiac enlargement. The mediastinal and hilar contours are within normal limits. The right IJ dialysis catheter is stable. The lungs are clear of an acute process. No pulmonary edema or pleural effusions. No pulmonary infiltrates. IMPRESSION: No acute cardiopulmonary findings. Electronically Signed   By: Marijo Sanes M.D.   On: 04/15/2021 20:29    Procedures Procedures   Medications Ordered in ED Medications  amLODipine (NORVASC) tablet 10 mg (has no administration in time range)  atorvastatin (LIPITOR) tablet 40 mg (has no administration in time range)  hydrALAZINE (APRESOLINE) tablet 100 mg (has no administration in time range)    ED Course  I have reviewed the triage vital signs and the nursing notes.  Pertinent labs & imaging results that were available during my care of the patient were reviewed by me and considered in my medical decision making (see chart for details).    MDM Rules/Calculators/A&P                          Patient here with an episode of dizziness that lasted for about an hour and started prior to his arrival 9 hours ago.  He states that he was feeling like his potassium might be high.  He says that he felt similar to when he was seen in the emergency  department 2 weeks ago for the same.  He did complete dialysis on Friday and is scheduled for dialysis this morning.  His laboratory work-up was notable for mild hyperkalemia at 5.4.  He is noted to be hypertensive, but did not take his evening blood pressure medicines.  I will give him his evening dose.  Chest x-ray shows no pulmonary edema or pleural effusions, no cardiopulmonary findings are identified.    EKG shows no ischemic findings.   Troponins are flat at 32/33  I believe that patient is stable for discharge to home so that he can go to his dialysis. Final Clinical Impression(s) / ED Diagnoses Final diagnoses:  Hyperkalemia    Rx / DC Orders ED Discharge Orders     None        Montine Circle, PA-C 04/16/21 [place], MD 04/16/21 [id number]

## 2021-05-04 DIAGNOSIS — N186 End stage renal disease: Secondary | ICD-10-CM | POA: Diagnosis not present

## 2021-05-04 DIAGNOSIS — E875 Hyperkalemia: Secondary | ICD-10-CM | POA: Diagnosis not present

## 2021-05-04 DIAGNOSIS — D509 Iron deficiency anemia, unspecified: Secondary | ICD-10-CM | POA: Diagnosis not present

## 2021-05-04 DIAGNOSIS — N2581 Secondary hyperparathyroidism of renal origin: Secondary | ICD-10-CM | POA: Diagnosis not present

## 2021-05-04 DIAGNOSIS — Z992 Dependence on renal dialysis: Secondary | ICD-10-CM | POA: Diagnosis not present

## 2021-05-04 DIAGNOSIS — I129 Hypertensive chronic kidney disease with stage 1 through stage 4 chronic kidney disease, or unspecified chronic kidney disease: Secondary | ICD-10-CM | POA: Diagnosis not present

## 2021-05-04 DIAGNOSIS — D631 Anemia in chronic kidney disease: Secondary | ICD-10-CM | POA: Diagnosis not present

## 2021-05-25 ENCOUNTER — Ambulatory Visit: Payer: Medicare Other | Admitting: Podiatry

## 2021-05-30 DIAGNOSIS — R5383 Other fatigue: Secondary | ICD-10-CM | POA: Diagnosis not present

## 2021-05-30 DIAGNOSIS — E7800 Pure hypercholesterolemia, unspecified: Secondary | ICD-10-CM | POA: Diagnosis not present

## 2021-06-04 DIAGNOSIS — N2581 Secondary hyperparathyroidism of renal origin: Secondary | ICD-10-CM | POA: Diagnosis not present

## 2021-06-04 DIAGNOSIS — N186 End stage renal disease: Secondary | ICD-10-CM | POA: Diagnosis not present

## 2021-06-04 DIAGNOSIS — D509 Iron deficiency anemia, unspecified: Secondary | ICD-10-CM | POA: Diagnosis not present

## 2021-06-04 DIAGNOSIS — I129 Hypertensive chronic kidney disease with stage 1 through stage 4 chronic kidney disease, or unspecified chronic kidney disease: Secondary | ICD-10-CM | POA: Diagnosis not present

## 2021-06-04 DIAGNOSIS — Z992 Dependence on renal dialysis: Secondary | ICD-10-CM | POA: Diagnosis not present

## 2021-06-04 DIAGNOSIS — D631 Anemia in chronic kidney disease: Secondary | ICD-10-CM | POA: Diagnosis not present

## 2021-06-04 DIAGNOSIS — E875 Hyperkalemia: Secondary | ICD-10-CM | POA: Diagnosis not present

## 2021-07-02 DIAGNOSIS — M545 Low back pain, unspecified: Secondary | ICD-10-CM | POA: Insufficient documentation

## 2021-07-03 ENCOUNTER — Encounter (HOSPITAL_COMMUNITY): Payer: Self-pay | Admitting: Emergency Medicine

## 2021-07-03 ENCOUNTER — Other Ambulatory Visit: Payer: Self-pay

## 2021-07-03 ENCOUNTER — Ambulatory Visit (HOSPITAL_COMMUNITY)
Admission: EM | Admit: 2021-07-03 | Discharge: 2021-07-03 | Disposition: A | Discharge status: Home / Self Care | Payer: Medicare Other | Attending: Family Medicine | Admitting: Family Medicine

## 2021-07-03 DIAGNOSIS — M545 Low back pain, unspecified: Secondary | ICD-10-CM

## 2021-07-03 MED ORDER — CYCLOBENZAPRINE HCL 5 MG PO TABS: 5.0000 mg | ORAL_TABLET | Freq: Three times a day (TID) | ORAL | 0 refills | Status: DC | PRN

## 2021-07-03 MED ORDER — CYCLOBENZAPRINE HCL 5 MG PO TABS: 5.0000 mg | ORAL_TABLET | Freq: Three times a day (TID) | ORAL | 0 refills | Status: AC | PRN

## 2021-07-03 NOTE — Discharge Instructions (Addendum)
We will treat your low back pain with muscle relaxer and you can also use Voltaren gel or the patches that we talked about the you can get over-the-counter.  If you are having worsening of pain or lack of improvement, you can follow-up with orthopedics, Dr. Lorin Mercy is an orthopedic surgeon.  If you have signs or symptoms suggestive of infection including fever, inability to tolerate anything by mouth, changes in bowel or bladder function, numbness and tingling in your groin, significant weakness of your lower extremity, you should be seen in the emergency room right away.

## 2021-07-03 NOTE — ED Triage Notes (Signed)
Pt reports having lower back pain for about week. Reports pain in left hip area as well. Pain is worse with movement esp when bending over. Pt is dialysis patient and reports nephrologist referred him to be seen. Denies any urinary problems.

## 2021-07-03 NOTE — ED Provider Notes (Signed)
[place]    CSN: [id number] Arrival date & time: 07/03/21  1241      History   Chief Complaint Chief Complaint  Patient presents with   Back Pain    HPI Marvin Roberson is a 50 y.o. male.   Left sided low back pain Into posterior hip No radiation down leg No N/T No fevers Is ESRD on HD, has been compliant, last session was yesterday, no history of renal transplant Denies blood thinner Still urinates some, has not noticed a change in his urination pattern Otherwise feeling well Told his nephrologist about the pain and she recommended eval at urgent care for pain medication    Past Medical History:  Diagnosis Date   Adrenal insufficiency (HCC)    Allergy    Anemia    Anxiety    Depression    Dyspnea    "when I have too much fluid"   ESRD on dialysis ([place]) since 1990s   "M,W,F; [address]" (11/27/2016)   GERD (gastroesophageal reflux disease)    Heart failure with reduced ejection fraction ([place])    High cholesterol    History of blood transfusion    Hypertension    Kidney failure    Renal insufficiency    Stroke ([place]) 2016   decreased vision in his left eye/notes 11/27/2016   Tricuspid regurgitation     Patient Active Problem List   Diagnosis Date Noted   Other fatigue 01/08/2021   Other fluid overload 12/19/2020   Preop cardiovascular exam 11/16/2020   HFrEF (heart failure with reduced ejection fraction) ([place]) 11/16/2020   Thoracic aortic aneurysm without rupture ([place]) 11/16/2020   Nonrheumatic tricuspid valve regurgitation 11/16/2020   Tobacco abuse 11/16/2020   Chills (without fever) 10/09/2020   Syncope and collapse 09/19/2020   Acute on chronic combined systolic and diastolic CHF (congestive heart failure) ([place]) 09/19/2020   Essential hypertension 09/19/2020   Allergy, unspecified, initial encounter 07/17/2020   Anaphylactic shock, unspecified, initial encounter 07/17/2020   COVID-19 06/19/2020   Pneumonia due to COVID-19 virus  06/13/2020   Hepatic cirrhosis ([place]) 03/09/2020   Encounter for other preprocedural examination 03/09/2020   Fracture of metacarpal bone 02/11/2020   Pain of left hand 02/10/2020   Hyperglycemia, unspecified 05/31/2019   Dysuria 05/24/2019   Chronic nonintractable headache 01/28/2019   Thrombocytopenia ([place]) 08/13/2018   ESRD on dialysis ([place])    ESRD (end stage renal disease) ([place]) 01/14/2018   Hyperkalemia, diminished renal excretion 10/20/2017   Dependence on renal dialysis ([place]) 10/08/2017   Post-operative complication 40/98/1191   Bacteremia 12/13/2016   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[place])    ESRD on hemodialysis ([place])    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[place]) 12/11/2016   Mild protein-calorie malnutrition ([place]) 12/06/2016   Hypocalcemia 11/30/2016   Adrenal insufficiency (HCC)    Hypoglycemia due to insulin 11/27/2016   Hypertension 11/27/2016   History of CVA (cerebrovascular accident) 11/27/2016   Monoclonal gammopathy of unknown significance ([facility]) 11/27/2016   Anemia in chronic kidney disease 11/20/2016   Coagulation defect, unspecified ([place]) 47/82/9562   Complication of vascular dialysis catheter 11/20/2016   Iron deficiency anemia, unspecified 11/20/2016   Pruritus, unspecified 11/20/2016   Secondary hyperparathyroidism of renal origin ([place]) 11/20/2016   Shortness of breath 11/20/2016   Bradycardia 02/26/2016   Hypertensive cardiomyopathy ([place]) 01/22/2016   Hypertensive kidney disease with ESRD (end-stage renal disease) ([place]) 01/22/2016   Staphylococcus aureus bacteremia 01/22/2016   Hydrocele 12/01/2015   CAD (coronary artery disease) 07/24/2015   Injury  of median nerve at left upper arm level 07/13/2015   Anticoagulation goal of INR 2 to 3 07/05/2014   Avascular necrosis of femoral head ([place]) 07/05/2014   Intestinal occlusion (HCC) 07/05/2014   Weakness of both hips 07/05/2014   Anemia, chronic disease 03/18/2014   Hyponatremia 03/18/2014   Retroperitoneal  lymphadenopathy 03/18/2014   Weight loss 03/03/2014   Sepsis ([place]) 03/30/2013   Migraine headache 10/11/2012   Pulmonary embolism ([place]) 10/06/2012   DVT of upper extremity (deep vein thrombosis) ([place]) 10/05/2012   Cellulitis of left arm 10/04/2012    Past Surgical History:  Procedure Laterality Date   ANGIOPLASTY Left 12/11/2016   Procedure: ANGIOPLASTY LEFT ARM & SUBCLAVIAN ARTERY;  Surgeon: Conrad East Nicolaus, MD;  Location: [facility];  Service: Vascular;  Laterality: Left;   arm surgery Left 2016   "for aneurysm"   AV FISTULA PLACEMENT     FALSE ANEURYSM REPAIR Left 12/11/2016   Procedure: RESECTION  BRACHIAL ARTERY;  Surgeon: Conrad Sunrise, MD;  Location: [facility];  Service: Vascular;  Laterality: Left;   FALSE ANEURYSM REPAIR Left 01/14/2018   Procedure: REPAIR FALSE ANEURYSM LEFT BRACHIAL ARTERY WITH SAPHENOUS VEIN  LEFT ARTERIOVENOUS GRAFT;  Surgeon: Rosetta Posner, MD;  Location: [facility];  Service: Vascular;  Laterality: Left;   INGUINAL HERNIA REPAIR Left 11/23/2020   Procedure: LAPAROSCOPIC LEFT INGUINAL HERNIA REPAIR WITH MESH;  Surgeon: Ralene Ok, MD;  Location: [facility];  Service: General;  Laterality: Left;   INSERTION OF DIALYSIS CATHETER Right    chest   IR FLUORO GUIDE CV LINE RIGHT  11/17/2019   THROMBECTOMY BRACHIAL ARTERY  12/11/2016   Procedure: THROMBECTOMY BRACHIAL ARTERY AND ULNAR;  Surgeon: Conrad Burgoon, MD;  Location: [facility];  Service: Vascular;;   THYROIDECTOMY, PARTIAL     UPPER EXTREMITY ANGIOGRAM Left 12/11/2016   Procedure: LEFT ARM ANGIOGRAM;  Surgeon: Conrad Ivanhoe, MD;  Location: [facility] OR;  Service: Vascular;  Laterality: Left;   VEIN HARVEST Left 01/14/2018   Procedure: VEIN HARVEST LEFT SAPHENOUS VEIN;  Surgeon: Rosetta Posner, MD;  Location: [facility] OR;  Service: Vascular;  Laterality: Left;   WOUND EXPLORATION Left 12/11/2016   Procedure: LEFT ARM BRACHIAL ARTERY WITH INTERPOSTIONAL GRAFT;  Surgeon: Conrad Jayuya, MD;  Location: [facility];  Service: Vascular;  Laterality: Left;        Home Medications    Prior to Admission medications   Medication Sig Start Date End Date Taking? Authorizing Provider  cyclobenzaprine (FLEXERIL) 5 MG tablet Take 1 tablet (5 mg total) by mouth 3 (three) times daily as needed for muscle spasms. 07/03/21  Yes Korin Hartwell, Bernita Raisin, DO  amLODipine (NORVASC) 10 MG tablet Take 10 mg by mouth at bedtime.  01/07/19   [provider]  atorvastatin (LIPITOR) 40 MG tablet Take 40 mg by mouth at bedtime.  01/27/16   [provider]  calcium acetate (PHOSLO) 667 MG capsule Take 2,001 mg by mouth 3 (three) times daily with meals. 01/08/19   [provider]  carvedilol (COREG) 25 MG tablet Take 25 mg by mouth 2 (two) times daily with a meal.    [provider]  guaiFENesin-dextromethorphan (ROBITUSSIN DM) 100-10 MG/5ML syrup Take 10 mLs by mouth every 4 (four) hours as needed for cough. 06/16/20   Mosetta Anis, MD  hydrALAZINE (APRESOLINE) 100 MG tablet Take 100 mg by mouth 2 (two) times daily.    [provider]  hydrocortisone (CORTEF) 5 MG tablet Take  5 mg by mouth daily.     [provider]  isosorbide dinitrate (ISORDIL) 10 MG tablet Take 1 tablet (10 mg total) by mouth 3 (three) times daily. Patient taking differently: Take 10 mg by mouth 2 (two) times daily. 11/16/20   Werner Lean, MD  Methoxy PEG-Epoetin Beta (MIRCERA IJ) Mircera 01/15/21 01/14/22  [provider]  pantoprazole (PROTONIX) 40 MG tablet Take 1 tablet by mouth daily. 12/25/20   [provider]  traMADol (ULTRAM) 50 MG tablet Take 1 tablet (50 mg total) by mouth every 6 (six) hours as needed. Patient taking differently: Take 50 mg by mouth every 6 (six) hours as needed for moderate pain. 11/23/20 11/23/21  Ralene Ok, MD    Family History Family History  Problem Relation Age of Onset   Hypertension Other    Cancer Brother        in leg and lungs   Heart attack Mother    Adrenal disorder Neg Hx     Colon cancer Neg Hx    Rectal cancer Neg Hx    Stomach cancer Neg Hx     Social History Social History   Tobacco Use   Smoking status: Every Day    Packs/day: 0.25    Years: 20.00    Pack years: 5.00    Types: Cigarettes   Smokeless tobacco: Never   Tobacco comments:    1 pk every 4 days.  Vaping Use   Vaping Use: Never used  Substance Use Topics   Alcohol use: Not Currently   Drug use: No     Allergies   Doxycycline, Codeine, Gabapentin, Lisinopril, Povidone-iodine, and Betadine [povidone iodine]   Review of Systems Review of Systems  Constitutional:  Negative for activity change, appetite change, chills and fever.  HENT:  Negative for congestion.   Respiratory:  Negative for shortness of breath.   Cardiovascular:  Negative for chest pain.  Gastrointestinal:  Negative for abdominal pain, constipation and vomiting.  Genitourinary:  Negative for decreased urine volume, difficulty urinating and hematuria.  Musculoskeletal:  Positive for back pain.  Skin:  Negative for rash.  Neurological:  Negative for weakness and numbness.    Physical Exam Triage Vital Signs ED Triage Vitals  Enc Vitals Group     BP 07/03/21 1353 (!) 165/97     Pulse Rate 07/03/21 1353 72     Resp 07/03/21 1353 18     Temp 07/03/21 1353 99.3 F (37.4 C)     Temp Source 07/03/21 1353 Oral     SpO2 07/03/21 1353 96 %     Weight --      Height --      Head Circumference --      Peak Flow --      Pain Score 07/03/21 1349 10     Pain Loc --      Pain Edu? --      Excl. in [place]? --    No data found.  Updated Vital Signs BP (!) 165/97 (BP Location: Left Leg)   Pulse 72   Temp 99.3 F (37.4 C) (Oral)   Resp 18   SpO2 96%   Visual Acuity Right Eye Distance:   Left Eye Distance:   Bilateral Distance:    Right Eye Near:   Left Eye Near:    Bilateral Near:     Physical Exam Constitutional:      General: He is not in acute distress.    Appearance: Normal appearance.  He is not  ill-appearing or toxic-appearing.  HENT:     Head: Normocephalic and atraumatic.  Cardiovascular:     Rate and Rhythm: Normal rate and regular rhythm.  Pulmonary:     Effort: Pulmonary effort is normal.     Breath sounds: Normal breath sounds.  Abdominal:     Tenderness: There is no right CVA tenderness or left CVA tenderness.  Musculoskeletal:     Comments: Lumbar spine: - Inspection: no gross deformity or asymmetry, swelling or ecchymosis - Palpation: No TTP over the spinous processes, he has some TTP ober left paraspinal musculature.  No TTP SI joint or greater trochanter specifically - ROM: full active ROM of the lumbar spine in flexion and extension with pain at extremes of both - Strength: 5/5 strength of lower extremity in L4-S1 nerve root distributions b/l; normal gait - Neuro: sensation intact in the L4-S1 nerve root distribution b/l, 2+ L4 and S1 reflexes - Special testing: Negative straight leg raise  Hips: Good ROM, slight pain with extreme of ER on left    Skin:    General: Skin is warm and dry.  Neurological:     Mental Status: He is alert and oriented to person, place, and time.     Sensory: No sensory deficit.     Motor: No weakness.     UC Treatments / Results  Labs (all labs ordered are listed, but only abnormal results are displayed) Labs Reviewed - No data to display  EKG   Radiology No results found.  Procedures Procedures (including critical care time)  Medications Ordered in UC Medications - No data to display  Initial Impression / Assessment and Plan / UC Course  I have reviewed the triage vital signs and the nursing notes.  Pertinent labs & imaging results that were available during my care of the patient were reviewed by me and considered in my medical decision making (see chart for details).     Patient is a 50 year old male with history of ESRD on HD Monday, Wednesday, Friday, adrenal insufficiency, history of CVA, history of  pulmonary embolism not on anticoagulation, CHF, who presents with left-sided low back and posterior hip pain for 1 week.  He is not have any red flag symptoms.  Does not appear to be related to his kidneys on exam and he does not have any signs or symptoms suggestive of an infection at this time.  Examination is most consistent with musculoskeletal low back pain.  He has good range of motion of his hip, reassured him that it does not appear that he has an intra-articular hip pathology.  We will treat him with Flexeril 5 mg 3 times daily as needed, limit use to 15 tablets given ESRD.  He can also use topical Salonpas patches or lidocaine patches.  We will have him follow-up with his PCP for further management.  He does request an orthopedic referral, therefore referral placed.  He was discharged home in stable condition.   Final Clinical Impressions(s) / UC Diagnoses   Final diagnoses:  Acute left-sided low back pain without sciatica     Discharge Instructions      We will treat your low back pain with muscle relaxer and you can also use Voltaren gel or the patches that we talked about the you can get over-the-counter.  If you are having worsening of pain or lack of improvement, you can follow-up with orthopedics, Dr. Lorin Mercy is an orthopedic surgeon.  If you have signs or  symptoms suggestive of infection including fever, inability to tolerate anything by mouth, changes in bowel or bladder function, numbness and tingling in your groin, significant weakness of your lower extremity, you should be seen in the emergency room right away.     ED Prescriptions     Medication Sig Dispense Auth. Provider   cyclobenzaprine (FLEXERIL) 5 MG tablet  (Status: Discontinued) Take 1 tablet (5 mg total) by mouth 3 (three) times daily as needed for muscle spasms. 15 tablet Salim Forero, Bernita Raisin, DO   cyclobenzaprine (FLEXERIL) 5 MG tablet Take 1 tablet (5 mg total) by mouth 3 (three) times daily as needed for muscle  spasms. 15 tablet Emile Ringgenberg, Bernita Raisin, DO      PDMP not reviewed this encounter.   [place], Bernita Raisin, DO 07/03/21 1504

## 2021-07-05 DIAGNOSIS — I129 Hypertensive chronic kidney disease with stage 1 through stage 4 chronic kidney disease, or unspecified chronic kidney disease: Secondary | ICD-10-CM | POA: Diagnosis not present

## 2021-07-05 DIAGNOSIS — Z992 Dependence on renal dialysis: Secondary | ICD-10-CM | POA: Diagnosis not present

## 2021-07-05 DIAGNOSIS — N186 End stage renal disease: Secondary | ICD-10-CM | POA: Diagnosis not present

## 2021-07-06 DIAGNOSIS — E875 Hyperkalemia: Secondary | ICD-10-CM | POA: Diagnosis not present

## 2021-07-06 DIAGNOSIS — N186 End stage renal disease: Secondary | ICD-10-CM | POA: Diagnosis not present

## 2021-07-06 DIAGNOSIS — Z992 Dependence on renal dialysis: Secondary | ICD-10-CM | POA: Diagnosis not present

## 2021-07-06 DIAGNOSIS — D631 Anemia in chronic kidney disease: Secondary | ICD-10-CM | POA: Diagnosis not present

## 2021-07-06 DIAGNOSIS — N2581 Secondary hyperparathyroidism of renal origin: Secondary | ICD-10-CM | POA: Diagnosis not present

## 2021-07-06 DIAGNOSIS — D509 Iron deficiency anemia, unspecified: Secondary | ICD-10-CM | POA: Diagnosis not present

## 2021-07-18 ENCOUNTER — Other Ambulatory Visit: Payer: Self-pay | Admitting: Nephrology

## 2021-07-18 DIAGNOSIS — I6781 Acute cerebrovascular insufficiency: Secondary | ICD-10-CM

## 2021-07-18 DIAGNOSIS — N186 End stage renal disease: Secondary | ICD-10-CM

## 2021-07-19 DIAGNOSIS — M545 Low back pain, unspecified: Secondary | ICD-10-CM | POA: Diagnosis not present

## 2021-07-19 DIAGNOSIS — M25552 Pain in left hip: Secondary | ICD-10-CM | POA: Diagnosis not present

## 2021-07-21 ENCOUNTER — Emergency Department (HOSPITAL_COMMUNITY)
Admission: EM | Admit: 2021-07-21 | Discharge: 2021-07-21 | Disposition: A | Discharge status: Home / Self Care | Payer: Medicare Other | Attending: Emergency Medicine | Admitting: Emergency Medicine

## 2021-07-21 ENCOUNTER — Other Ambulatory Visit: Payer: Self-pay

## 2021-07-21 ENCOUNTER — Encounter (HOSPITAL_COMMUNITY): Payer: Self-pay | Admitting: Emergency Medicine

## 2021-07-21 ENCOUNTER — Emergency Department (HOSPITAL_COMMUNITY): Payer: Medicare Other

## 2021-07-21 DIAGNOSIS — M79604 Pain in right leg: Secondary | ICD-10-CM

## 2021-07-21 DIAGNOSIS — F17210 Nicotine dependence, cigarettes, uncomplicated: Secondary | ICD-10-CM | POA: Diagnosis not present

## 2021-07-21 DIAGNOSIS — Z8616 Personal history of COVID-19: Secondary | ICD-10-CM | POA: Insufficient documentation

## 2021-07-21 DIAGNOSIS — I5043 Acute on chronic combined systolic (congestive) and diastolic (congestive) heart failure: Secondary | ICD-10-CM | POA: Insufficient documentation

## 2021-07-21 DIAGNOSIS — Z992 Dependence on renal dialysis: Secondary | ICD-10-CM | POA: Diagnosis not present

## 2021-07-21 DIAGNOSIS — I2510 Atherosclerotic heart disease of native coronary artery without angina pectoris: Secondary | ICD-10-CM | POA: Insufficient documentation

## 2021-07-21 DIAGNOSIS — Z79899 Other long term (current) drug therapy: Secondary | ICD-10-CM | POA: Diagnosis not present

## 2021-07-21 DIAGNOSIS — M25551 Pain in right hip: Secondary | ICD-10-CM | POA: Diagnosis not present

## 2021-07-21 DIAGNOSIS — N186 End stage renal disease: Secondary | ICD-10-CM | POA: Diagnosis not present

## 2021-07-21 DIAGNOSIS — I132 Hypertensive heart and chronic kidney disease with heart failure and with stage 5 chronic kidney disease, or end stage renal disease: Secondary | ICD-10-CM | POA: Diagnosis not present

## 2021-07-21 DIAGNOSIS — M8700 Idiopathic aseptic necrosis of unspecified bone: Secondary | ICD-10-CM | POA: Diagnosis not present

## 2021-07-21 LAB — BASIC METABOLIC PANEL
Anion gap: 13 (ref 5–15)
BUN: 37 mg/dL — ABNORMAL HIGH (ref 6–20)
CO2: 28 mmol/L (ref 22–32)
Calcium: 8.2 mg/dL — ABNORMAL LOW (ref 8.9–10.3)
Chloride: 93 mmol/L — ABNORMAL LOW (ref 98–111)
Creatinine, Ser: 10.88 mg/dL — ABNORMAL HIGH (ref 0.61–1.24)
GFR, Estimated: 5 mL/min — ABNORMAL LOW (ref >60)
Glucose, Bld: 62 mg/dL — ABNORMAL LOW (ref 70–99)
Potassium: 4.3 mmol/L (ref 3.5–5.1)
Sodium: 134 mmol/L — ABNORMAL LOW (ref 135–145)

## 2021-07-21 LAB — CBC
HCT: 25.2 % — ABNORMAL LOW (ref 39.0–52.0)
Hemoglobin: 7.9 g/dL — ABNORMAL LOW (ref 13.0–17.0)
MCH: 29.9 pg (ref 26.0–34.0)
MCHC: 31.3 g/dL (ref 30.0–36.0)
MCV: 95.5 fL (ref 80.0–100.0)
Platelets: UNDETERMINED 10*3/uL (ref 150–400)
RBC: 2.64 MIL/uL — ABNORMAL LOW (ref 4.22–5.81)
RDW: 17.9 % — ABNORMAL HIGH (ref 11.5–15.5)
WBC: 5.1 10*3/uL (ref 4.0–10.5)
nRBC: 0 % (ref 0.0–0.2)

## 2021-07-21 MED ORDER — HYDROCODONE-ACETAMINOPHEN 5-325 MG PO TABS
1.0000 | ORAL_TABLET | Freq: Once | ORAL | Status: AC
  Administered 2021-07-21: 1 via ORAL
  Filled 2021-07-21: qty 1

## 2021-07-21 MED ORDER — METHOCARBAMOL 500 MG PO TABS
1000.0000 mg | ORAL_TABLET | Freq: Once | ORAL | Status: AC
  Administered 2021-07-21: 1000 mg via ORAL
  Filled 2021-07-21: qty 2

## 2021-07-21 MED ORDER — ONDANSETRON 4 MG PO TBDP
4.0000 mg | ORAL_TABLET | Freq: Once | ORAL | Status: AC
  Administered 2021-07-21: 4 mg via ORAL
  Filled 2021-07-21 (×2): qty 1

## 2021-07-21 MED ORDER — HYDROCODONE-ACETAMINOPHEN 5-325 MG PO TABS: 2.0000 | ORAL_TABLET | ORAL | 0 refills | Status: DC | PRN

## 2021-07-21 MED ORDER — METHOCARBAMOL 500 MG PO TABS: 1000.0000 mg | ORAL_TABLET | Freq: Two times a day (BID) | ORAL | 0 refills | Status: AC

## 2021-07-21 MED ORDER — LIDOCAINE 5 % EX PTCH: 1.0000 | MEDICATED_PATCH | Freq: Every day | CUTANEOUS | 0 refills | Status: AC | PRN

## 2021-07-21 NOTE — ED Notes (Signed)
Patient transported to X-ray 

## 2021-07-21 NOTE — ED Provider Notes (Addendum)
[facility] EMERGENCY DEPARTMENT Provider Note   CSN: [id number] Arrival date & time: 07/21/21  1759     History Chief Complaint  Patient presents with  . Leg Pain    Marvin Roberson is a 50 y.o. male.  50 year old male with history of ESRD on hemodialysis Monday Wednesday Friday.  Last hemodialysis was Friday.  The ER secondary to ongoing hip pain.  Bilateral, right greater than left.  Patient was by orthopedics recently was told that he needed a hip replacement.  Patient reports pain is been ongoing over the past few days, worse with ambulation or direct palpation.  Tylenol x1 yesterday which did not significantly improve his discomfort.  No further oral medications for his pain.  No falls or acute traumatic injury to either hip.  Ambulates with a cane at baseline.  Does report his pain has been ongoing for greater than 6 months.  Fevers, chills, IV drug use, recent injections to spine or hip joint.  No change in bowel function.  Only produces minimal urine, unchanged.   The history is provided by the patient. No language interpreter was used.  Leg Pain Location:  Hip Time since incident:  6 months Injury: no   Hip location:  L hip and R hip Pain details:    Quality:  Aching and throbbing   Onset quality:  Gradual   Duration:  6 months   Progression:  Worsening Chronicity:  Chronic Associated symptoms: no fever       Past Medical History:  Diagnosis Date  . Adrenal insufficiency ([place])   . Allergy   . Anemia   . Anxiety   . Depression   . Dyspnea    "when I have too much fluid"  . ESRD on dialysis [facility]) since 1990s   "M,W,F; [address]" (11/27/2016)  . GERD (gastroesophageal reflux disease)   . Heart failure with reduced ejection fraction ([place])   . High cholesterol   . History of blood transfusion   . Hypertension   . Kidney failure   . Renal insufficiency   . Stroke ([place]) 2016   decreased vision in his left eye/notes 11/27/2016  . Tricuspid  regurgitation     Patient Active Problem List   Diagnosis Date Noted  . Other fatigue 01/08/2021  . Other fluid overload 12/19/2020  . Preop cardiovascular exam 11/16/2020  . HFrEF (heart failure with reduced ejection fraction) ([place]) 11/16/2020  . Thoracic aortic aneurysm without rupture ([place]) 11/16/2020  . Nonrheumatic tricuspid valve regurgitation 11/16/2020  . Tobacco abuse 11/16/2020  . Chills (without fever) 10/09/2020  . Syncope and collapse 09/19/2020  . Acute on chronic combined systolic and diastolic CHF (congestive heart failure) ([place]) 09/19/2020  . Essential hypertension 09/19/2020  . Allergy, unspecified, initial encounter 07/17/2020  . Anaphylactic shock, unspecified, initial encounter 07/17/2020  . COVID-19 06/19/2020  . Pneumonia due to COVID-19 virus 06/13/2020  . Hepatic cirrhosis ([place]) 03/09/2020  . Encounter for other preprocedural examination 03/09/2020  . Fracture of metacarpal bone 02/11/2020  . Pain of left hand 02/10/2020  . Hyperglycemia, unspecified 05/31/2019  . Dysuria 05/24/2019  . Chronic nonintractable headache 01/28/2019  . Thrombocytopenia ([place]) 08/13/2018  . ESRD on dialysis ([place])   . ESRD (end stage renal disease) ([place]) 01/14/2018  . Hyperkalemia, diminished renal excretion 10/20/2017  . Dependence on renal dialysis ([place]) 10/08/2017  . Post-operative complication 38/08/1750  . Bacteremia 12/13/2016  . 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 ESRD on hemodialysis ([place])   . Pseudoaneurysm  of brachial artery ([place]) 12/11/2016  . Mild protein-calorie malnutrition ([place]) 12/06/2016  . Hypocalcemia 11/30/2016  . Adrenal insufficiency ([place])   . Hypoglycemia due to insulin 11/27/2016  . Hypertension 11/27/2016  . History of CVA (cerebrovascular accident) 11/27/2016  . Monoclonal gammopathy of unknown significance (MGUS) 11/27/2016  . Anemia in chronic kidney disease 11/20/2016  . Coagulation defect, unspecified ([place]) 11/20/2016  . Complication of vascular  dialysis catheter 11/20/2016  . Iron deficiency anemia, unspecified 11/20/2016  . Pruritus, unspecified 11/20/2016  . Secondary hyperparathyroidism of renal origin ([place]) 11/20/2016  . Shortness of breath 11/20/2016  . Bradycardia 02/26/2016  . Hypertensive cardiomyopathy ([place]) 01/22/2016  . Hypertensive kidney disease with ESRD (end-stage renal disease) ([place]) 01/22/2016  . Staphylococcus aureus bacteremia 01/22/2016  . Hydrocele 12/01/2015  . CAD (coronary artery disease) 07/24/2015  . 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 Anticoagulation goal of INR 2 to 3 07/05/2014  . Avascular necrosis of femoral head ([place]) 07/05/2014  . Intestinal occlusion ([place]) 07/05/2014  . Weakness of both hips 07/05/2014  . Anemia, chronic disease 03/18/2014  . Hyponatremia 03/18/2014  . Retroperitoneal lymphadenopathy 03/18/2014  . Weight loss 03/03/2014  . Sepsis ([place]) 03/30/2013  . Migraine headache 10/11/2012  . Pulmonary embolism ([place]) 10/06/2012  . DVT of upper extremity (deep vein thrombosis) ([place]) 10/05/2012  . Cellulitis of left arm 10/04/2012    Past Surgical History:  Procedure Laterality Date  . ANGIOPLASTY Left 12/11/2016   Procedure: ANGIOPLASTY LEFT ARM & SUBCLAVIAN ARTERY;  Surgeon: Conrad Donald, MD;  Location: [facility];  Service: Vascular;  Laterality: Left;  . arm surgery Left 2016   "for aneurysm"  . AV FISTULA PLACEMENT    . FALSE ANEURYSM REPAIR Left 12/11/2016   Procedure: RESECTION  BRACHIAL ARTERY;  Surgeon: Conrad Thorp, MD;  Location: [facility];  Service: Vascular;  Laterality: Left;  . FALSE ANEURYSM REPAIR Left 01/14/2018   Procedure: REPAIR FALSE ANEURYSM LEFT BRACHIAL ARTERY WITH SAPHENOUS VEIN  LEFT ARTERIOVENOUS GRAFT;  Surgeon: Rosetta Posner, MD;  Location: [facility];  Service: Vascular;  Laterality: Left;  . INGUINAL HERNIA REPAIR Left 11/23/2020   Procedure: LAPAROSCOPIC LEFT INGUINAL HERNIA REPAIR WITH MESH;  Surgeon: Ralene Ok, MD;  Location: [facility];   Service: General;  Laterality: Left;  . INSERTION OF DIALYSIS CATHETER Right    chest  . IR FLUORO GUIDE CV LINE RIGHT  11/17/2019  . THROMBECTOMY BRACHIAL ARTERY  12/11/2016   Procedure: THROMBECTOMY BRACHIAL ARTERY AND ULNAR;  Surgeon: Conrad Vergennes, MD;  Location: [facility];  Service: Vascular;;  . THYROIDECTOMY, PARTIAL    . UPPER EXTREMITY ANGIOGRAM Left 12/11/2016   Procedure: LEFT ARM ANGIOGRAM;  Surgeon: Conrad Kennard, MD;  Location: [facility];  Service: Vascular;  Laterality: Left;  Marland Kitchen VEIN HARVEST Left 01/14/2018   Procedure: VEIN HARVEST LEFT SAPHENOUS VEIN;  Surgeon: Rosetta Posner, MD;  Location: [facility];  Service: Vascular;  Laterality: Left;  . WOUND EXPLORATION Left 12/11/2016   Procedure: LEFT ARM BRACHIAL ARTERY WITH INTERPOSTIONAL GRAFT;  Surgeon: Conrad Quinn, MD;  Location: [facility] OR;  Service: Vascular;  Laterality: Left;       Family History  Problem Relation Age of Onset  . Hypertension Other   . Cancer Brother        in leg and lungs  . Heart attack Mother   . Adrenal disorder Neg Hx   . Colon cancer Neg Hx   . Rectal  cancer Neg Hx   . Stomach cancer Neg Hx     Social History   Tobacco Use  . Smoking status: Every Day    Packs/day: 0.25    Years: 20.00    Pack years: 5.00    Types: Cigarettes  . Smokeless tobacco: Never  . Tobacco comments:    1 pk every 4 days.  Vaping Use  . Vaping Use: Never used  Substance Use Topics  . Alcohol use: Not Currently  . Drug use: No    Home Medications Prior to Admission medications   Medication Sig Start Date End Date Taking? Authorizing Provider  HYDROcodone-acetaminophen (NORCO/VICODIN) 5-325 MG tablet Take 2 tablets by mouth every 4 (four) hours as needed. Dont take with tramadol 07/21/21  Yes Wynona Dove A, DO  lidocaine (LIDODERM) 5 % Place 1 patch onto the skin daily as needed. Remove & Discard patch within 12 hours or as directed by MD 07/21/21  Yes Jeanell Sparrow, DO  methocarbamol (ROBAXIN) 500 MG tablet Take 2 tablets  (1,000 mg total) by mouth 2 (two) times daily. 07/21/21  Yes Wynona Dove A, DO  amLODipine (NORVASC) 10 MG tablet Take 10 mg by mouth at bedtime.  01/07/19   [provider]  atorvastatin (LIPITOR) 40 MG tablet Take 40 mg by mouth at bedtime.  01/27/16   [provider]  calcium acetate (PHOSLO) 667 MG capsule Take 2,001 mg by mouth 3 (three) times daily with meals. 01/08/19   [provider]  carvedilol (COREG) 25 MG tablet Take 25 mg by mouth 2 (two) times daily with a meal.    [provider]  cyclobenzaprine (FLEXERIL) 5 MG tablet Take 1 tablet (5 mg total) by mouth 3 (three) times daily as needed for muscle spasms. 07/03/21   Meccariello, Bernita Raisin, DO  guaiFENesin-dextromethorphan (ROBITUSSIN DM) 100-10 MG/5ML syrup Take 10 mLs by mouth every 4 (four) hours as needed for cough. 06/16/20   Mosetta Anis, MD  hydrALAZINE (APRESOLINE) 100 MG tablet Take 100 mg by mouth 2 (two) times daily.    [provider]  hydrocortisone (CORTEF) 5 MG tablet Take 5 mg by mouth daily.     [provider]  isosorbide dinitrate (ISORDIL) 10 MG tablet Take 1 tablet (10 mg total) by mouth 3 (three) times daily. Patient taking differently: Take 10 mg by mouth 2 (two) times daily. 11/16/20   Werner Lean, MD  Methoxy PEG-Epoetin Beta (MIRCERA IJ) Mircera 01/15/21 01/14/22  [provider]  pantoprazole (PROTONIX) 40 MG tablet Take 1 tablet by mouth daily. 12/25/20   [provider]  traMADol (ULTRAM) 50 MG tablet Take 1 tablet (50 mg total) by mouth every 6 (six) hours as needed. Patient taking differently: Take 50 mg by mouth every 6 (six) hours as needed for moderate pain. 11/23/20 11/23/21  Ralene Ok, MD    Allergies    Doxycycline, Codeine, Gabapentin, Lisinopril, Povidone-iodine, and Betadine [povidone iodine]  Review of Systems   Review of Systems  Constitutional:  Negative for chills and fever.  HENT:  Negative for facial  swelling and trouble swallowing.   Eyes:  Negative for photophobia and visual disturbance.  Respiratory:  Negative for cough and shortness of breath.   Cardiovascular:  Negative for chest pain and palpitations.  Gastrointestinal:  Negative for abdominal pain, nausea and vomiting.  Endocrine: Negative for polydipsia and polyuria.  Genitourinary:  Negative for difficulty urinating and hematuria.  Musculoskeletal:  Positive for arthralgias and gait  problem. Negative for joint swelling.  Skin:  Negative for pallor and rash.  Neurological:  Negative for syncope and headaches.  Psychiatric/Behavioral:  Negative for agitation and confusion.    Physical Exam Updated Vital Signs BP (!) 172/82 (BP Location: Right Arm)   Pulse 80   Temp 98.3 F (36.8 C) (Oral)   Resp 16   SpO2 99%   Physical Exam Vitals and nursing note reviewed.  Constitutional:      General: He is not in acute distress.    Appearance: He is well-developed.  HENT:     Head: Normocephalic and atraumatic.     Right Ear: External ear normal.     Left Ear: External ear normal.     Mouth/Throat:     Mouth: Mucous membranes are moist.  Eyes:     General: No scleral icterus. Cardiovascular:     Rate and Rhythm: Normal rate and regular rhythm.     Pulses: Normal pulses.     Heart sounds: Normal heart sounds.  Pulmonary:     Effort: Pulmonary effort is normal. No respiratory distress.     Breath sounds: Normal breath sounds.  Chest:    Abdominal:     General: Abdomen is flat.     Palpations: Abdomen is soft.     Tenderness: There is no abdominal tenderness.  Musculoskeletal:        General: Normal range of motion.     Cervical back: Normal range of motion.     Right lower leg: No edema.     Left lower leg: No edema.     Comments: Lower extremities are neurovascular intact bilaterally.  Pain to right side acetabular region on direct palpation.  Skin:    General: Skin is warm and dry.     Capillary Refill:  Capillary refill takes less than 2 seconds.  Neurological:     Mental Status: He is alert and oriented to person, place, and time.  Psychiatric:        Mood and Affect: Mood normal.        Behavior: Behavior normal.    ED Results / Procedures / Treatments   Labs (all labs ordered are listed, but only abnormal results are displayed) Labs Reviewed  BASIC METABOLIC PANEL - Abnormal; Notable for the following components:      Result Value   Sodium 134 (*)    Chloride 93 (*)    Glucose, Bld 62 (*)    BUN 37 (*)    Creatinine, Ser 10.88 (*)    Calcium 8.2 (*)    GFR, Estimated 5 (*)    All other components within normal limits  CBC - Abnormal; Notable for the following components:   RBC 2.64 (*)    Hemoglobin 7.9 (*)    HCT 25.2 (*)    RDW 17.9 (*)    All other components within normal limits    EKG None  Radiology DG Hip Unilat W or Wo Pelvis 2-3 Views Right  Result Date: 07/21/2021 CLINICAL DATA:  Right hip pain.  No injury. EXAM: DG HIP (WITH OR WITHOUT PELVIS) 2-3V RIGHT COMPARISON:  Abdomen 12/06/2020 FINDINGS: Mild valgus orientation to the right hip. There is evidence of bilateral avascular necrosis with lucency and sclerosis in the superior femoral heads, greater on the left. No evidence of acute fracture or dislocation. IMPRESSION: Changes likely indicating bilateral avascular necrosis, greater on the left. Electronically Signed   By: Lucienne Capers M.D.   On: 07/21/2021 20:25  Procedures Procedures   Medications Ordered in ED Medications  HYDROcodone-acetaminophen (NORCO/VICODIN) 5-325 MG per tablet 1 tablet (1 tablet Oral Given 07/21/21 1947)  ondansetron (ZOFRAN-ODT) disintegrating tablet 4 mg (4 mg Oral Given 07/21/21 1948)  methocarbamol (ROBAXIN) tablet 1,000 mg (1,000 mg Oral Given 07/21/21 1947)  HYDROcodone-acetaminophen (NORCO/VICODIN) 5-325 MG per tablet 1 tablet (1 tablet Oral Given 07/21/21 2037)    ED Course  I have reviewed the triage vital signs  and the nursing notes.  Pertinent labs & imaging results that were available during my care of the patient were reviewed by me and considered in my medical decision making (see chart for details).    MDM Rules/Calculators/A&P                           This patient complains of hip pain; this involves an extensive number of treatment Options and is a complaint that carries with it a high risk of complications and Morbidity.  Vital signs reviewed.  Lower extremities neurovascular intact symmetric bilateral.  Serious etiologies considered.   Patient with chronic pain to his bilateral hips, right greater than left.  Has followed orthopedics as an outpatient reports that he is need for hip replacement bilaterally.  XR of hip on the right does demonstrate b/l avascular necrosis. No acute fracture.  Patient given analgesics, he is ambulatory.  Given patient with bilateral avascular necrosis nonweightbearing status is difficult.  This is chronic avascular necrosis.  Advised patient to use a cane or walker as tolerated.  Follow-up with orthopedics for ongoing evaluation.  Discussed multimodal pain control with the patient. Strict return precautions.   Patient with mildly reduced hemoglobin from baseline.  He is ESRD on hemodialysis.  Patient may require iron infusion in the future.  No black stool or blood per rectum.  No frank bleeding reported.  Advised patient follow-up with his nephrologist regarding low hemoglobin.  No abdominal pain. Also f/u with PCP regarding this.  The patient improved significantly and was discharged in stable condition. Detailed discussions were had with the patient regarding current findings, and need for close f/u with PCP or on call doctor. The patient has been instructed to return immediately if the symptoms worsen in any way for re-evaluation. Patient verbalized understanding and is in agreement with current care plan. All questions answered prior to  discharge.   Final Clinical Impression(s) / ED Diagnoses Final diagnoses:  Avascular necrosis ([place])  Right leg pain    Rx / DC Orders ED Discharge Orders          Ordered    methocarbamol (ROBAXIN) 500 MG tablet  2 times daily        07/21/21 2256    HYDROcodone-acetaminophen (NORCO/VICODIN) 5-325 MG tablet  Every 4 hours PRN        07/21/21 2256    lidocaine (LIDODERM) 5 %  Daily PRN        07/21/21 2256             Jeanell Sparrow, DO 07/22/21 0048    Jeanell Sparrow, DO 07/22/21 0050

## 2021-07-21 NOTE — ED Triage Notes (Signed)
Pt c/o right leg pain, states he saw an orthopedic who told him he needs a hip replacement. Also concerned that his potassium is low, denies n/v/d, dialysis MWF.

## 2021-07-21 NOTE — ED Notes (Signed)
E-signature pad unavailable at time of pt discharge. This RN discussed discharge materials with pt and answered all pt questions. Pt stated understanding of discharge material. ? ?

## 2021-07-21 NOTE — Discharge Instructions (Addendum)
Avoid weight bearing to the right hip

## 2021-07-24 ENCOUNTER — Emergency Department (HOSPITAL_COMMUNITY): Payer: Medicare Other

## 2021-07-24 ENCOUNTER — Observation Stay (HOSPITAL_COMMUNITY): Payer: Medicare Other

## 2021-07-24 ENCOUNTER — Inpatient Hospital Stay (HOSPITAL_COMMUNITY)
Admission: EM | Admit: 2021-07-24 | Discharge: 2021-07-26 | DRG: 917 | Disposition: A | Discharge status: Home / Self Care | Payer: Medicare Other | Attending: Internal Medicine | Admitting: Internal Medicine

## 2021-07-24 ENCOUNTER — Other Ambulatory Visit: Payer: Self-pay

## 2021-07-24 DIAGNOSIS — Z79899 Other long term (current) drug therapy: Secondary | ICD-10-CM | POA: Diagnosis not present

## 2021-07-24 DIAGNOSIS — R748 Abnormal levels of other serum enzymes: Secondary | ICD-10-CM | POA: Diagnosis not present

## 2021-07-24 DIAGNOSIS — D6959 Other secondary thrombocytopenia: Secondary | ICD-10-CM | POA: Diagnosis present

## 2021-07-24 DIAGNOSIS — M87051 Idiopathic aseptic necrosis of right femur: Secondary | ICD-10-CM | POA: Diagnosis not present

## 2021-07-24 DIAGNOSIS — R569 Unspecified convulsions: Secondary | ICD-10-CM | POA: Diagnosis not present

## 2021-07-24 DIAGNOSIS — N2581 Secondary hyperparathyroidism of renal origin: Secondary | ICD-10-CM | POA: Diagnosis present

## 2021-07-24 DIAGNOSIS — G319 Degenerative disease of nervous system, unspecified: Secondary | ICD-10-CM | POA: Diagnosis not present

## 2021-07-24 DIAGNOSIS — Z888 Allergy status to other drugs, medicaments and biological substances status: Secondary | ICD-10-CM

## 2021-07-24 DIAGNOSIS — M87851 Other osteonecrosis, right femur: Secondary | ICD-10-CM | POA: Diagnosis present

## 2021-07-24 DIAGNOSIS — I69998 Other sequelae following unspecified cerebrovascular disease: Secondary | ICD-10-CM | POA: Diagnosis not present

## 2021-07-24 DIAGNOSIS — Z885 Allergy status to narcotic agent status: Secondary | ICD-10-CM | POA: Diagnosis not present

## 2021-07-24 DIAGNOSIS — Z8249 Family history of ischemic heart disease and other diseases of the circulatory system: Secondary | ICD-10-CM

## 2021-07-24 DIAGNOSIS — I132 Hypertensive heart and chronic kidney disease with heart failure and with stage 5 chronic kidney disease, or end stage renal disease: Secondary | ICD-10-CM | POA: Diagnosis present

## 2021-07-24 DIAGNOSIS — E162 Hypoglycemia, unspecified: Secondary | ICD-10-CM | POA: Diagnosis present

## 2021-07-24 DIAGNOSIS — R0902 Hypoxemia: Secondary | ICD-10-CM | POA: Diagnosis not present

## 2021-07-24 DIAGNOSIS — M87052 Idiopathic aseptic necrosis of left femur: Secondary | ICD-10-CM | POA: Diagnosis not present

## 2021-07-24 DIAGNOSIS — E161 Other hypoglycemia: Secondary | ICD-10-CM | POA: Diagnosis not present

## 2021-07-24 DIAGNOSIS — D631 Anemia in chronic kidney disease: Secondary | ICD-10-CM | POA: Diagnosis present

## 2021-07-24 DIAGNOSIS — Z20822 Contact with and (suspected) exposure to covid-19: Secondary | ICD-10-CM | POA: Diagnosis present

## 2021-07-24 DIAGNOSIS — E7800 Pure hypercholesterolemia, unspecified: Secondary | ICD-10-CM | POA: Diagnosis present

## 2021-07-24 DIAGNOSIS — I10 Essential (primary) hypertension: Secondary | ICD-10-CM | POA: Diagnosis not present

## 2021-07-24 DIAGNOSIS — Z7401 Bed confinement status: Secondary | ICD-10-CM

## 2021-07-24 DIAGNOSIS — G9340 Encephalopathy, unspecified: Secondary | ICD-10-CM | POA: Diagnosis not present

## 2021-07-24 DIAGNOSIS — T402X1A Poisoning by other opioids, accidental (unintentional), initial encounter: Secondary | ICD-10-CM | POA: Diagnosis present

## 2021-07-24 DIAGNOSIS — N186 End stage renal disease: Secondary | ICD-10-CM | POA: Diagnosis present

## 2021-07-24 DIAGNOSIS — E271 Primary adrenocortical insufficiency: Secondary | ICD-10-CM | POA: Diagnosis present

## 2021-07-24 DIAGNOSIS — H538 Other visual disturbances: Secondary | ICD-10-CM | POA: Diagnosis present

## 2021-07-24 DIAGNOSIS — M25552 Pain in left hip: Secondary | ICD-10-CM | POA: Diagnosis not present

## 2021-07-24 DIAGNOSIS — M87852 Other osteonecrosis, left femur: Secondary | ICD-10-CM | POA: Diagnosis present

## 2021-07-24 DIAGNOSIS — R404 Transient alteration of awareness: Secondary | ICD-10-CM | POA: Diagnosis not present

## 2021-07-24 DIAGNOSIS — R4182 Altered mental status, unspecified: Secondary | ICD-10-CM | POA: Diagnosis present

## 2021-07-24 DIAGNOSIS — I120 Hypertensive chronic kidney disease with stage 5 chronic kidney disease or end stage renal disease: Secondary | ICD-10-CM | POA: Diagnosis not present

## 2021-07-24 DIAGNOSIS — Z992 Dependence on renal dialysis: Secondary | ICD-10-CM | POA: Diagnosis not present

## 2021-07-24 DIAGNOSIS — G9389 Other specified disorders of brain: Secondary | ICD-10-CM | POA: Diagnosis not present

## 2021-07-24 DIAGNOSIS — M8700 Idiopathic aseptic necrosis of unspecified bone: Secondary | ICD-10-CM

## 2021-07-24 DIAGNOSIS — I5022 Chronic systolic (congestive) heart failure: Secondary | ICD-10-CM | POA: Diagnosis present

## 2021-07-24 DIAGNOSIS — E11649 Type 2 diabetes mellitus with hypoglycemia without coma: Secondary | ICD-10-CM | POA: Diagnosis not present

## 2021-07-24 DIAGNOSIS — Z881 Allergy status to other antibiotic agents status: Secondary | ICD-10-CM

## 2021-07-24 DIAGNOSIS — R4020 Unspecified coma: Secondary | ICD-10-CM | POA: Diagnosis not present

## 2021-07-24 DIAGNOSIS — E875 Hyperkalemia: Secondary | ICD-10-CM | POA: Diagnosis present

## 2021-07-24 DIAGNOSIS — K7460 Unspecified cirrhosis of liver: Secondary | ICD-10-CM | POA: Diagnosis present

## 2021-07-24 DIAGNOSIS — K219 Gastro-esophageal reflux disease without esophagitis: Secondary | ICD-10-CM | POA: Diagnosis present

## 2021-07-24 DIAGNOSIS — Z91128 Patient's intentional underdosing of medication regimen for other reason: Secondary | ICD-10-CM

## 2021-07-24 DIAGNOSIS — R102 Pelvic and perineal pain: Secondary | ICD-10-CM | POA: Diagnosis not present

## 2021-07-24 DIAGNOSIS — F17210 Nicotine dependence, cigarettes, uncomplicated: Secondary | ICD-10-CM | POA: Diagnosis present

## 2021-07-24 LAB — URINALYSIS, MICROSCOPIC (REFLEX): Squamous Epithelial / HPF: NONE SEEN (ref 0–5)

## 2021-07-24 LAB — URINALYSIS, ROUTINE W REFLEX MICROSCOPIC
Bilirubin Urine: NEGATIVE
Glucose, UA: 250 mg/dL — AB
Ketones, ur: 15 mg/dL — AB
Leukocytes,Ua: NEGATIVE
Nitrite: NEGATIVE
Protein, ur: 100 mg/dL — AB
Specific Gravity, Urine: 1.015 (ref 1.005–1.030)
pH: 8 (ref 5.0–8.0)

## 2021-07-24 LAB — BASIC METABOLIC PANEL
Anion gap: 17 — ABNORMAL HIGH (ref 5–15)
BUN: 64 mg/dL — ABNORMAL HIGH (ref 6–20)
CO2: 22 mmol/L (ref 22–32)
Calcium: 7.9 mg/dL — ABNORMAL LOW (ref 8.9–10.3)
Chloride: 90 mmol/L — ABNORMAL LOW (ref 98–111)
Creatinine, Ser: 15.44 mg/dL — ABNORMAL HIGH (ref 0.61–1.24)
GFR, Estimated: 3 mL/min — ABNORMAL LOW (ref >60)
Glucose, Bld: 187 mg/dL — ABNORMAL HIGH (ref 70–99)
Potassium: 4.8 mmol/L (ref 3.5–5.1)
Sodium: 129 mmol/L — ABNORMAL LOW (ref 135–145)

## 2021-07-24 LAB — CBC WITH DIFFERENTIAL/PLATELET
Abs Immature Granulocytes: 0.04 10*3/uL (ref 0.00–0.07)
Basophils Absolute: 0.1 10*3/uL (ref 0.0–0.1)
Basophils Relative: 1 %
Eosinophils Absolute: 0.7 10*3/uL — ABNORMAL HIGH (ref 0.0–0.5)
Eosinophils Relative: 10 %
HCT: 26.3 % — ABNORMAL LOW (ref 39.0–52.0)
Hemoglobin: 8.5 g/dL — ABNORMAL LOW (ref 13.0–17.0)
Immature Granulocytes: 1 %
Lymphocytes Relative: 9 %
Lymphs Abs: 0.6 10*3/uL — ABNORMAL LOW (ref 0.7–4.0)
MCH: 30.4 pg (ref 26.0–34.0)
MCHC: 32.3 g/dL (ref 30.0–36.0)
MCV: 93.9 fL (ref 80.0–100.0)
Monocytes Absolute: 0.6 10*3/uL (ref 0.1–1.0)
Monocytes Relative: 9 %
Neutro Abs: 5 10*3/uL (ref 1.7–7.7)
Neutrophils Relative %: 70 %
Platelets: 104 10*3/uL — ABNORMAL LOW (ref 150–400)
RBC: 2.8 MIL/uL — ABNORMAL LOW (ref 4.22–5.81)
RDW: 17.2 % — ABNORMAL HIGH (ref 11.5–15.5)
WBC: 7 10*3/uL (ref 4.0–10.5)
nRBC: 0 % (ref 0.0–0.2)

## 2021-07-24 LAB — CBG MONITORING, ED
Glucose-Capillary: 128 mg/dL — ABNORMAL HIGH (ref 70–99)
Glucose-Capillary: 86 mg/dL (ref 70–99)
Glucose-Capillary: 93 mg/dL (ref 70–99)
Glucose-Capillary: 156 mg/dL — ABNORMAL HIGH (ref 70–99)

## 2021-07-24 LAB — HEPATIC FUNCTION PANEL
ALT: 10 U/L (ref 0–44)
AST: 34 U/L (ref 15–41)
Albumin: 2.5 g/dL — ABNORMAL LOW (ref 3.5–5.0)
Alkaline Phosphatase: 203 U/L — ABNORMAL HIGH (ref 38–126)
Bilirubin, Direct: 0.2 mg/dL (ref 0.0–0.2)
Indirect Bilirubin: 1.2 mg/dL — ABNORMAL HIGH (ref 0.3–0.9)
Total Bilirubin: 1.4 mg/dL — ABNORMAL HIGH (ref 0.3–1.2)
Total Protein: 8 g/dL (ref 6.5–8.1)

## 2021-07-24 LAB — MAGNESIUM: Magnesium: 2.1 mg/dL (ref 1.7–2.4)

## 2021-07-24 MED ORDER — SODIUM CHLORIDE 0.9 % IV SOLN: 250.0000 mL | INTRAVENOUS | Status: DC | PRN

## 2021-07-24 MED ORDER — ACETAMINOPHEN 325 MG PO TABS: 650.0000 mg | ORAL_TABLET | Freq: Four times a day (QID) | ORAL | Status: DC | PRN

## 2021-07-24 MED ORDER — SODIUM CHLORIDE 0.9% FLUSH: 3.0000 mL | INTRAVENOUS | Status: DC | PRN

## 2021-07-24 MED ORDER — HYDROMORPHONE HCL 1 MG/ML IJ SOLN
0.5000 mg | Freq: Once | INTRAMUSCULAR | Status: AC
  Administered 2021-07-24: 0.5 mg via INTRAVENOUS
  Filled 2021-07-24: qty 1

## 2021-07-24 MED ORDER — ATORVASTATIN CALCIUM 40 MG PO TABS
40.0000 mg | ORAL_TABLET | Freq: Every day | ORAL | Status: DC
  Administered 2021-07-25 – 2021-07-26 (×2): 40 mg via ORAL
  Filled 2021-07-24 (×2): qty 1

## 2021-07-24 MED ORDER — ISOSORBIDE DINITRATE 10 MG PO TABS
10.0000 mg | ORAL_TABLET | Freq: Two times a day (BID) | ORAL | Status: DC
  Filled 2021-07-24: qty 1

## 2021-07-24 MED ORDER — SENNOSIDES-DOCUSATE SODIUM 8.6-50 MG PO TABS
1.0000 | ORAL_TABLET | Freq: Every evening | ORAL | Status: DC | PRN
  Administered 2021-07-26: 1 via ORAL
  Filled 2021-07-24: qty 1

## 2021-07-24 MED ORDER — ACETAMINOPHEN 650 MG RE SUPP: 650.0000 mg | Freq: Four times a day (QID) | RECTAL | Status: DC | PRN

## 2021-07-24 MED ORDER — METHYLPREDNISOLONE SODIUM SUCC 125 MG IJ SOLR
125.0000 mg | Freq: Once | INTRAMUSCULAR | Status: AC
  Administered 2021-07-24: 125 mg via INTRAVENOUS
  Filled 2021-07-24: qty 2

## 2021-07-24 MED ORDER — HYDROMORPHONE HCL 1 MG/ML IJ SOLN: 1.0000 mg | INTRAMUSCULAR | Status: DC | PRN

## 2021-07-24 MED ORDER — CHLORHEXIDINE GLUCONATE CLOTH 2 % EX PADS
6.0000 | MEDICATED_PAD | Freq: Every day | CUTANEOUS | Status: DC
  Administered 2021-07-25 – 2021-07-26 (×2): 6 via TOPICAL

## 2021-07-24 MED ORDER — HEPARIN SODIUM (PORCINE) 5000 UNIT/ML IJ SOLN
5000.0000 [IU] | Freq: Three times a day (TID) | INTRAMUSCULAR | Status: DC
  Filled 2021-07-24 (×3): qty 1

## 2021-07-24 MED ORDER — SODIUM CHLORIDE 0.9% FLUSH
3.0000 mL | Freq: Two times a day (BID) | INTRAVENOUS | Status: DC
  Administered 2021-07-24 – 2021-07-26 (×5): 3 mL via INTRAVENOUS

## 2021-07-24 MED ORDER — LORAZEPAM 1 MG PO TABS
0.5000 mg | ORAL_TABLET | Freq: Once | ORAL | Status: AC | PRN
  Administered 2021-07-24: 0.5 mg via ORAL
  Filled 2021-07-24: qty 1

## 2021-07-24 MED ORDER — AMLODIPINE BESYLATE 5 MG PO TABS
10.0000 mg | ORAL_TABLET | Freq: Once | ORAL | Status: DC
  Filled 2021-07-24: qty 2

## 2021-07-24 MED ORDER — PANTOPRAZOLE SODIUM 40 MG PO TBEC
40.0000 mg | DELAYED_RELEASE_TABLET | Freq: Every day | ORAL | Status: DC
  Administered 2021-07-25: 40 mg via ORAL
  Filled 2021-07-24 (×2): qty 1

## 2021-07-24 MED ORDER — HYDROMORPHONE HCL 2 MG PO TABS: 1.0000 mg | ORAL_TABLET | ORAL | Status: DC

## 2021-07-24 NOTE — ED Notes (Signed)
Messaged Dr Langston Masker re: Marvin Roberson increasing blood pressures; Marvin Roberson states that is baseline for him with hx of dialysis but has been steadily increasing throughout his stay. Awaiting response from EDP.   Blood sugars appear to have stabilized at this time. Marvin Roberson sitting in bed eating sandwich. Continues to report pain in his R hip 10/10.

## 2021-07-24 NOTE — ED Provider Notes (Signed)
[facility] EMERGENCY DEPARTMENT Provider Note   CSN: [id number] Arrival date & time: 07/24/21  1109     History CC: Hypoglycemia   Marvin Roberson is a 50 y.o. male with history of end-stage renal disease on dialysis Monday Wednesday Friday, reported adrenal insufficiency on Cortef daily, avascular necrosis of the hips, presenting to emergency department with concern for unresponsiveness.  EMS reports they were called on the scene by the girlfriend because the patient was not waking up and behaving normally this morning.  He was last seen normal in the evening.  They reported they arrived on scene the patient was confused, initial blood sugar of 27.  They gave the patient 25 g of D10, followed another 20 g of D10, and his last blood sugar on repeat was 178.  Patient's mental status significantly improved after the blood sugars.  Here in the ED the patient is complaining only of lower back and bilateral hip pains.  Per medical record review, he does appear to be diagnosed with avascular necrosis, and he states that he was told by the orthopedist that his hips need to be replaced.  He reports his appointment was at [facility], but that "they said they weren't gonna do my hips because I'm moving to [place] in November."  His girlfriend reported to EMS the patient was essentially bedbound due to pain in his hips.  He tells me he missed 1 dialysis session yesterday.  He has not missed any other dialysis.  He is not on oxygen at home.  He also reports that he has been compliant with his Cortef, but only missed 1 day of this medication.  He is not on insulin.  He denies prior history of hypoglycemic episodes.  HPI     Past Medical History:  Diagnosis Date   Adrenal insufficiency (HCC)    Allergy    Anemia    Anxiety    Depression    Dyspnea    "when I have too much fluid"   ESRD on dialysis [facility]) since 1990s   "M,W,F; [address]" (11/27/2016)   GERD  (gastroesophageal reflux disease)    Heart failure with reduced ejection fraction (HCC)    High cholesterol    History of blood transfusion    Hypertension    Kidney failure    Renal insufficiency    Stroke ([place]) 2016   decreased vision in his left eye/notes 11/27/2016   Tricuspid regurgitation     Patient Active Problem List   Diagnosis Date Noted   Adrenal insufficiency (Addison's disease) ([place]) 07/24/2021   Other fatigue 01/08/2021   Other fluid overload 12/19/2020   Preop cardiovascular exam 11/16/2020   HFrEF (heart failure with reduced ejection fraction) ([place]) 11/16/2020   Thoracic aortic aneurysm without rupture ([place]) 11/16/2020   Nonrheumatic tricuspid valve regurgitation 11/16/2020   Tobacco abuse 11/16/2020   Chills (without fever) 10/09/2020   Syncope and collapse 09/19/2020   Acute on chronic combined systolic and diastolic CHF (congestive heart failure) ([place]) 09/19/2020   Essential hypertension 09/19/2020   Allergy, unspecified, initial encounter 07/17/2020   Anaphylactic shock, unspecified, initial encounter 07/17/2020   COVID-19 06/19/2020   Pneumonia due to COVID-19 virus 06/13/2020   Hepatic cirrhosis ([place]) 03/09/2020   Encounter for other preprocedural examination 03/09/2020   Fracture of metacarpal bone 02/11/2020   Pain of left hand 02/10/2020   Hyperglycemia, unspecified 05/31/2019   Dysuria 05/24/2019   Chronic nonintractable headache 01/28/2019   Thrombocytopenia ([place]) 08/13/2018  ESRD on dialysis [facility])    ESRD (end stage renal disease) ([place]) 01/14/2018   Hyperkalemia, diminished renal excretion 10/20/2017   Dependence on renal dialysis ([place]) 10/08/2017   Post-operative complication 95/62/1308   Bacteremia 12/13/2016   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[place])    ESRD on hemodialysis ([place])    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[place]) 12/11/2016   Mild protein-calorie malnutrition ([place]) 12/06/2016   Hypocalcemia 11/30/2016   Adrenal insufficiency (HCC)     Hypoglycemia due to insulin 11/27/2016   Hypertension 11/27/2016   History of CVA (cerebrovascular accident) 11/27/2016   Monoclonal gammopathy of unknown significance (MGUS) 11/27/2016   Anemia in chronic kidney disease 11/20/2016   Coagulation defect, unspecified ([place]) 65/78/4696   Complication of vascular dialysis catheter 11/20/2016   Iron deficiency anemia, unspecified 11/20/2016   Pruritus, unspecified 11/20/2016   Secondary hyperparathyroidism of renal origin ([place]) 11/20/2016   Shortness of breath 11/20/2016   Bradycardia 02/26/2016   Hypertensive cardiomyopathy ([place]) 01/22/2016   Hypertensive kidney disease with ESRD (end-stage renal disease) ([place]) 01/22/2016   Staphylococcus aureus bacteremia 01/22/2016   Hydrocele 12/01/2015   CAD (coronary artery disease) 07/24/2015   Injury of median nerve at left upper arm level 07/13/2015   Anticoagulation goal of INR 2 to 3 07/05/2014   Avascular necrosis of femoral head ([place]) 07/05/2014   Intestinal occlusion ([place]) 07/05/2014   Weakness of both hips 07/05/2014   Anemia, chronic disease 03/18/2014   Hyponatremia 03/18/2014   Retroperitoneal lymphadenopathy 03/18/2014   Weight loss 03/03/2014   Sepsis ([place]) 03/30/2013   Migraine headache 10/11/2012   Pulmonary embolism ([place]) 10/06/2012   DVT of upper extremity (deep vein thrombosis) ([place]) 10/05/2012   Cellulitis of left arm 10/04/2012    Past Surgical History:  Procedure Laterality Date   ANGIOPLASTY Left 12/11/2016   Procedure: ANGIOPLASTY LEFT ARM & SUBCLAVIAN ARTERY;  Surgeon: Conrad Bush, MD;  Location: [place];  Service: Vascular;  Laterality: Left;   arm surgery Left 2016   "for aneurysm"   AV FISTULA PLACEMENT     FALSE ANEURYSM REPAIR Left 12/11/2016   Procedure: RESECTION  BRACHIAL ARTERY;  Surgeon: Conrad Thunderbolt, MD;  Location: [place];  Service: Vascular;  Laterality: Left;   FALSE ANEURYSM REPAIR Left 01/14/2018   Procedure: REPAIR FALSE ANEURYSM LEFT BRACHIAL ARTERY WITH  SAPHENOUS VEIN  LEFT ARTERIOVENOUS GRAFT;  Surgeon: Rosetta Posner, MD;  Location: [place];  Service: Vascular;  Laterality: Left;   INGUINAL HERNIA REPAIR Left 11/23/2020   Procedure: LAPAROSCOPIC LEFT INGUINAL HERNIA REPAIR WITH MESH;  Surgeon: Ralene Ok, MD;  Location: [place];  Service: General;  Laterality: Left;   INSERTION OF DIALYSIS CATHETER Right    chest   IR FLUORO GUIDE CV LINE RIGHT  11/17/2019   THROMBECTOMY BRACHIAL ARTERY  12/11/2016   Procedure: THROMBECTOMY BRACHIAL ARTERY AND ULNAR;  Surgeon: Conrad Interlachen, MD;  Location: [facility] OR;  Service: Vascular;;   THYROIDECTOMY, PARTIAL     UPPER EXTREMITY ANGIOGRAM Left 12/11/2016   Procedure: LEFT ARM ANGIOGRAM;  Surgeon: Conrad [place], MD;  Location: [facility] OR;  Service: Vascular;  Laterality: Left;   VEIN HARVEST Left 01/14/2018   Procedure: VEIN HARVEST LEFT SAPHENOUS VEIN;  Surgeon: Rosetta Posner, MD;  Location: [facility] OR;  Service: Vascular;  Laterality: Left;   WOUND EXPLORATION Left 12/11/2016   Procedure: LEFT ARM BRACHIAL ARTERY WITH INTERPOSTIONAL GRAFT;  Surgeon: Conrad What Cheer, MD;  Location: [place];  Service: Vascular;  Laterality: Left;  Family History  Problem Relation Age of Onset   Hypertension Other    Cancer Brother        in leg and lungs   Heart attack Mother    Adrenal disorder Neg Hx    Colon cancer Neg Hx    Rectal cancer Neg Hx    Stomach cancer Neg Hx     Social History   Tobacco Use   Smoking status: Every Day    Packs/day: 0.25    Years: 20.00    Pack years: 5.00    Types: Cigarettes   Smokeless tobacco: Never   Tobacco comments:    1 pk every 4 days.  Vaping Use   Vaping Use: Never used  Substance Use Topics   Alcohol use: Not Currently   Drug use: No    Home Medications Prior to Admission medications   Medication Sig Start Date End Date Taking? Authorizing Provider  atorvastatin (LIPITOR) 40 MG tablet Take 40 mg by mouth at bedtime.  01/27/16  Yes [provider]  cyclobenzaprine  (FLEXERIL) 5 MG tablet Take 1 tablet (5 mg total) by mouth 3 (three) times daily as needed for muscle spasms. 07/03/21  Yes Meccariello, Bernita Raisin, DO  HYDROcodone-acetaminophen (NORCO/VICODIN) 5-325 MG tablet Take 2 tablets by mouth every 4 (four) hours as needed. Dont take with tramadol Patient taking differently: No sig reported 07/21/21  Yes Wynona Dove A, DO  amLODipine (NORVASC) 10 MG tablet Take 10 mg by mouth at bedtime.  01/07/19   [provider]  calcium acetate (PHOSLO) 667 MG capsule Take 1,334 mg by mouth 3 (three) times daily with meals. 01/08/19   [provider]  carvedilol (COREG) 25 MG tablet Take 25 mg by mouth 2 (two) times daily with a meal.    [provider]  hydrALAZINE (APRESOLINE) 100 MG tablet Take 100 mg by mouth 2 (two) times daily.    [provider]  hydrocortisone (CORTEF) 5 MG tablet Take 5 mg by mouth daily.     [provider]  isosorbide dinitrate (ISORDIL) 10 MG tablet Take 1 tablet (10 mg total) by mouth 3 (three) times daily. Patient taking differently: Take 10 mg by mouth 2 (two) times daily. 11/16/20   Chandrasekhar, Mahesh A, MD  lidocaine (LIDODERM) 5 % Place 1 patch onto the skin daily as needed. Remove & Discard patch within 12 hours or as directed by MD 07/21/21   Jeanell Sparrow, DO  methocarbamol (ROBAXIN) 500 MG tablet Take 2 tablets (1,000 mg total) by mouth 2 (two) times daily. 07/21/21   Jeanell Sparrow, DO  Methoxy PEG-Epoetin Beta (MIRCERA IJ) Mircera 01/15/21 01/14/22  [provider]  pantoprazole (PROTONIX) 40 MG tablet Take 40 mg by mouth daily. 12/25/20   [provider]    Allergies    Doxycycline, Codeine, Gabapentin, Lisinopril, Povidone-iodine, and Betadine [povidone iodine]  Review of Systems   Review of Systems  Constitutional:  Negative for chills and fever.  Eyes:  Negative for pain and visual disturbance.  Respiratory:  Negative for cough and shortness of breath.    Cardiovascular:  Negative for chest pain and palpitations.  Gastrointestinal:  Negative for abdominal pain and vomiting.  Genitourinary:  Negative for dysuria and hematuria.  Musculoskeletal:  Positive for arthralgias and myalgias.  Skin:  Negative for color change and rash.  Neurological:  Negative for syncope and headaches.  All other systems reviewed and are negative.  Physical Exam Updated Vital Signs BP Marland Kitchen)  199/110   Pulse 77   Temp (!) 97.5 F (36.4 C)   Resp 18   SpO2 97%   Physical Exam Constitutional:      Comments: Thin, chronically ill appearing  HENT:     Head: Normocephalic and atraumatic.  Eyes:     Conjunctiva/sclera: Conjunctivae normal.     Pupils: Pupils are equal, round, and reactive to light.  Cardiovascular:     Rate and Rhythm: Normal rate and regular rhythm.     Comments: Chest wall port right sided Pulmonary:     Effort: Pulmonary effort is normal. No respiratory distress.  Abdominal:     General: There is no distension.     Tenderness: There is no abdominal tenderness.  Musculoskeletal:     Comments: Pain and tenderness with movement of the hips  Skin:    General: Skin is warm and dry.  Neurological:     General: No focal deficit present.     Mental Status: He is alert and oriented to person, place, and time. Mental status is at baseline.  Psychiatric:        Mood and Affect: Mood normal.        Behavior: Behavior normal.    ED Results / Procedures / Treatments   Labs (all labs ordered are listed, but only abnormal results are displayed) Labs Reviewed  BASIC METABOLIC PANEL - Abnormal; Notable for the following components:      Result Value   Sodium 129 (*)    Chloride 90 (*)    Glucose, Bld 187 (*)    BUN 64 (*)    Creatinine, Ser 15.44 (*)    Calcium 7.9 (*)    GFR, Estimated 3 (*)    Anion gap 17 (*)    All other components within normal limits  CBC WITH DIFFERENTIAL/PLATELET - Abnormal; Notable for the following components:    RBC 2.80 (*)    Hemoglobin 8.5 (*)    HCT 26.3 (*)    RDW 17.2 (*)    Platelets 104 (*)    Lymphs Abs 0.6 (*)    Eosinophils Absolute 0.7 (*)    All other components within normal limits  HEPATIC FUNCTION PANEL - Abnormal; Notable for the following components:   Albumin 2.5 (*)    Alkaline Phosphatase 203 (*)    Total Bilirubin 1.4 (*)    Indirect Bilirubin 1.2 (*)    All other components within normal limits  CBG MONITORING, ED - Abnormal; Notable for the following components:   Glucose-Capillary 128 (*)    All other components within normal limits  CBG MONITORING, ED - Abnormal; Notable for the following components:   Glucose-Capillary 156 (*)    All other components within normal limits  MAGNESIUM  URINALYSIS, ROUTINE W REFLEX MICROSCOPIC  CBG MONITORING, ED  CBG MONITORING, ED    EKG None  Radiology DG Pelvis Portable  Result Date: 07/24/2021 CLINICAL DATA:  Avascular necrosis, BILATERAL hip pain EXAM: PORTABLE PELVIS 1-2 VIEWS COMPARISON:  Portable exam 1301 hours compared to CT abdomen and pelvis 01/27/2020 FINDINGS: Patient rotated to the RIGHT. Osseous mineralization normal. Avascular necrosis changes seen at the femoral heads bilaterally. Mild height loss/irregularity at the cranial margin of the LEFT femoral head is seen. Minimal flattening at the cranial margin of the RIGHT femoral head is a chronic defect as well. No new fracture, dislocation or bone destruction. IMPRESSION: Avascular necrosis changes of the femoral heads bilaterally, more severe on LEFT. Electronically Signed  By: Lavonia Dana M.D.   On: 07/24/2021 13:32   DG Chest Portable 1 View  Result Date: 07/24/2021 CLINICAL DATA:  BILATERAL hip pains, unresponsive, hypoglycemia, history avascular necrosis EXAM: PORTABLE CHEST 1 VIEW COMPARISON:  Portable exam 1258 hours compared to 04/15/2021 FINDINGS: Dual lumen RIGHT jugular central venous catheter with tip projecting over cavoatrial junction. Normal heart  size, mediastinal contours, and pulmonary vascularity. Lungs clear. No pulmonary infiltrate, pleural effusion, or pneumothorax. Surgical clips LEFT infraclavicular with vascular stent at the LEFT axilla. Bones demineralized. IMPRESSION: No acute abnormalities. Electronically Signed   By: Lavonia Dana M.D.   On: 07/24/2021 13:31    Procedures Procedures   Medications Ordered in ED Medications  sodium chloride flush (NS) 0.9 % injection 3 mL (3 mLs Intravenous Given 07/24/21 1216)  sodium chloride flush (NS) 0.9 % injection 3 mL (has no administration in time range)  0.9 %  sodium chloride infusion (has no administration in time range)  heparin injection 5,000 Units (has no administration in time range)  acetaminophen (TYLENOL) tablet 650 mg (has no administration in time range)    Or  acetaminophen (TYLENOL) suppository 650 mg (has no administration in time range)  senna-docusate (Senokot-S) tablet 1 tablet (has no administration in time range)  atorvastatin (LIPITOR) tablet 40 mg (has no administration in time range)  pantoprazole (PROTONIX) EC tablet 40 mg (has no administration in time range)  methylPREDNISolone sodium succinate (SOLU-MEDROL) 125 mg/2 mL injection 125 mg (125 mg Intravenous Given 07/24/21 1214)  HYDROmorphone (DILAUDID) injection 0.5 mg (0.5 mg Intravenous Given 07/24/21 1214)    ED Course  I have reviewed the triage vital signs and the nursing notes.  Pertinent labs & imaging results that were available during my care of the patient were reviewed by me and considered in my medical decision making (see chart for details).  Patient is here with an episode of hypoglycemia at home, with mental status significantly improved after being given sugar by the paramedics.  He is awake and alert on arrival.  He is complaining of significant continued pain in his hips.  No recent trauma.  However with his chronic steroid use, I will obtain an x-ray to evaluate for new pelvic  fracture.  I suspect he may be having hypoglycemic episode related to his adrenal insufficiency, particularly if he misses Cortef.  We will give him IV solumedrol here.  Labs reviewed.  WBC wnl.  Glucose stable on repeat check.  K 4.8.  DG chest reviewed with no sig volume overload.  No emergent indication for dialysis at this time.  Dg hip reviewed - no visible occult fx, but significant DDD and likely avascular necrosis.  IV dilaudid given, but patient frustrated with pain meds, stating, " They don't fix the pain, they just knock me out."  I explained that the underlying cause of his pain is likely his bone disease, and this is difficult to treat without a major operation, which needs to happen in consultation with an orthopedic surgeon.  For now we will continue to manage his pain as best we can while addressing the acute medical hospitalization for hypoglycemia.  Clinical Course as of 07/24/21 1657  Tue Jul 24, 2021  1529 Admitted to IM service [MT]    Clinical Course User Index [MT] Tawona Filsinger, Carola Rhine, MD    Final Clinical Impression(s) / ED Diagnoses Final diagnoses:  Hypoglycemia  Avascular necrosis ([place])    Rx / DC Orders ED Discharge Orders  None        Wyvonnia Dusky, MD 07/24/21 [phone]

## 2021-07-24 NOTE — ED Notes (Signed)
Pt to go for MRI. RN asked phlebotomy to obtain labs and cultures when pt returns from MRI.

## 2021-07-24 NOTE — ED Notes (Addendum)
Received call from MRI staff reporting pt being difficult in being cooperative for exam. Pt wanted to help himself to the machine, but then was not able to. When staff helped pt complained. Pt was also requesting something for his eyes, when given he requested it being taken away in the middle of the exam. Pt overall uncooperative and states there has to be another way to get this done. Pt will be transported back from MRI, per staff. Will update attending provider

## 2021-07-24 NOTE — ED Notes (Signed)
Received call from attending-Hoffman. Updated on POC and MRI status. Not to concerned with getting it done tonight, will reassess pt for being more willing in the morning. Will also update residents for pt, per [place].

## 2021-07-24 NOTE — ED Notes (Signed)
Pt stating he needs medication to go to MRI, family medicine team ordered PO ativan.

## 2021-07-24 NOTE — Progress Notes (Signed)
Patient is at MRI will attempt EEG at a later time.

## 2021-07-24 NOTE — ED Notes (Signed)
Pt very agitated, yelling at staff and cursing. Then intermittently falling asleep, not answering questions appropriately/accurately. Gets agitated with staff when asked benign questions (tell me about your pain, etc.)

## 2021-07-24 NOTE — Progress Notes (Signed)
EEG complete - results pending 

## 2021-07-24 NOTE — ED Notes (Signed)
RN pulled meds to address BP, family medicine team at bedside, state they are d/c'ing those meds for now and pursuing CT/MRI of head. RN did not administer meds per order.

## 2021-07-24 NOTE — ED Triage Notes (Signed)
PT BIB EMS due to hypoglycemia.Pts girlfriend called 911 bc pt was unresponsive. When EMS arrived pt was still unresponsive and BS was 27.  EMS gave 25g of D10 and BS was 63 and another 20 G of D10 and BS was 178. Pt is axox4 on arrival.

## 2021-07-24 NOTE — H&P (Addendum)
Date: 07/25/2021               Patient Name:  Marvin Roberson MRN: [id number]  DOB: Jan 01, 1971 Age / Sex: 50 y.o., male   PCP: Sonia Side., FNP         Medical Service: Internal Medicine Teaching Service         Attending Physician: Dr. Aldine Contes, MD    First Contact: Dr. Merrily Brittle Pager: [phone]  Second Contact: Dr. Sanjuan Dame Pager: [phone]       After Hours (After 5p/  First Contact Pager: [phone]  weekends / holidays): Second Contact Pager: [phone]   Chief Complaint: Hypoglycemia   History of Present Illness:  Marvin Roberson is a 50 y.o. male, with pertinent PMHx of ESRD on HD MWF, bilateral avascular necrosis, adrenal insufficiency, HFrEF on coreg 25mg  BID, cirrhosis, hypertension on Norvasc, hyperlipidemia on Lipitor, past CVA, presented today after being found unresponsive in bed, with his eyes open and soaked in his urine.   On 9/17, patient wanted to the ED for hip pain, and was discharged with Robaxin, 10 pills of Vicodin, and Lidoderm.  On encounter, patient was alert and oriented x4, however was unable to provide history, and had scanning speech. Per cousin, on Wednesday 9/14 after HD, patient complained of worsening bilateral hip pain, causing him to limp.  However he was still able to ambulate independently. On Friday, patient was still able to ambulate to and from HD.  However hours later, patient endorsed that he was not able to walk anymore due to pain. Since then patient has been bed ridden, unable to perform ADLs until Monday. That Monday he missed HD, reported due to pain.  Patient also had decreased appetite for the past week.  On Monday night, patient was alert and oriented.  However on Tuesday, patient was found at 9 AM unresponsive in bed covered in urine.   During the interview with friend at bedside, patients was mildly sedated, had scanning speech, and was fixated on the pain on his hip.  He reported that he just passed out, and that he does  not remember what happened.  Denied prodromal symptoms, such as nausea, vomiting, shortness of breath, chest pain, palpitations, dizziness, fever, chills.  However friend at bedside stated that patient had been complaining of dizziness and blurry vision for the past few days.  Currently patient lives with his cousin Marvin Roberson [phone]), who provided the history above, and her partner.  She reported that he has his stuff and majority of his medicines packed away, in preparation for a moved to [place].  Reported that he has not taken his medicines since about Friday, except for the pain meds that he recently received from the ED.  In the ED, blood glucose was 27 at arrival.  He received D10, resulting blood glucose 178, and became alert and oriented x4.  Meds:  Current Meds  Medication Sig   amLODipine (NORVASC) 10 MG tablet Take 10 mg by mouth at bedtime.    atorvastatin (LIPITOR) 40 MG tablet Take 40 mg by mouth at bedtime.    B Complex-C-Folic Acid (DIALYVITE PO) Take 1 tablet by mouth daily.   calcium acetate (PHOSLO) 667 MG capsule Take 1,334 mg by mouth daily after breakfast.   carvedilol (COREG) 25 MG tablet Take 25 mg by mouth 2 (two) times daily with a meal.   cyclobenzaprine (FLEXERIL) 5 MG tablet Take 1 tablet (5 mg total) by mouth 3 (three) times  daily as needed for muscle spasms.   hydrALAZINE (APRESOLINE) 100 MG tablet Take 100 mg by mouth 2 (two) times daily.   HYDROcodone-acetaminophen (NORCO/VICODIN) 5-325 MG tablet Take 2 tablets by mouth every 4 (four) hours as needed. Dont take with tramadol (Patient taking differently: Take 2 tablets by mouth every 4 (four) hours as needed for moderate pain.)   hydrocortisone (CORTEF) 5 MG tablet Take 5 mg by mouth daily.    isosorbide dinitrate (ISORDIL) 10 MG tablet Take 1 tablet (10 mg total) by mouth 3 (three) times daily. (Patient taking differently: Take 10 mg by mouth 2 (two) times daily.)   lidocaine (LIDODERM) 5 % Place 1 patch onto  the skin daily as needed. Remove & Discard patch within 12 hours or as directed by MD (Patient taking differently: Place 1 patch onto the skin daily as needed (pain). Remove & Discard patch within 12 hours or as directed by MD)   Methoxy PEG-Epoetin Beta (MIRCERA IJ) Mircera   pantoprazole (PROTONIX) 40 MG tablet Take 40 mg by mouth daily.     Allergies: Allergies as of 07/24/2021 - Review Complete 07/24/2021  Allergen Reaction Noted   Doxycycline Shortness Of Breath 07/10/2015   Codeine Nausea And Vomiting 10/04/2012   Gabapentin Marvin (See Comments) 11/25/2016   Lisinopril Marvin (See Comments) and Swelling 10/04/2012   Povidone-iodine Itching 10/04/2012   Betadine [povidone iodine] Rash 11/25/2016   Past Medical History:  Diagnosis Date   Adrenal insufficiency (HCC)    Allergy    Anemia    Anxiety    Depression    Dyspnea    "when I have too much fluid"   ESRD on dialysis ([place]) since 1990s   "M,W,F; [address]" (11/27/2016)   GERD (gastroesophageal reflux disease)    Heart failure with reduced ejection fraction (HCC)    High cholesterol    History of blood transfusion    Hypertension    Kidney failure    Renal insufficiency    Stroke ([place]) 2016   decreased vision in his left eye/notes 11/27/2016   Tricuspid regurgitation     Family History:  Patient reported that his father had a similar hip problem, and was not able to provide any further information Reported that his mother died of a heart attack last year, no Marvin information provided.  Social History:  He admitted to smoking tobacco regularly for at least 5 years.  Denied EtOH and Marvin substances.  Review of Systems: A complete ROS was negative except as per HPI.   Physical Exam: Blood pressure (!) 158/89, pulse 65, temperature 97.7 F (36.5 C), temperature source Oral, resp. rate 12, height 5\' 7"  (1.702 m), weight 59 kg, SpO2 96 %.  Physical Exam Vitals and nursing note reviewed.  Constitutional:       Appearance: He is not ill-appearing.     Comments: Younger looking than stated age  HENT:     Head: Normocephalic and atraumatic.  Eyes:     Extraocular Movements: Extraocular movements intact.  Cardiovascular:     Rate and Rhythm: Normal rate and regular rhythm.  Pulmonary:     Effort: Pulmonary effort is normal.     Breath sounds: Normal breath sounds.  Abdominal:     General: Abdomen is flat.     Palpations: Abdomen is soft.  Musculoskeletal:     Cervical back: Normal range of motion.  Neurological:     Mental Status: He is alert and oriented to person, place, and time.  Comments: Decreased sensation on right forehead Scanning speech present Slower speech  No upper extremity weakness or Marvin focal deficits Lower extremity exam limited due to pain.  Psychiatric:     Comments: Affect and mood dysphoric Speech circumferential, slowed Perseverates on hip pain Poor judgment and insight     DG Pelvis Portable  Result Date: 07/24/2021 CLINICAL DATA:  Avascular necrosis, BILATERAL hip pain EXAM: PORTABLE PELVIS 1-2 VIEWS COMPARISON:  Portable exam 1301 hours compared to CT abdomen and pelvis 01/27/2020 FINDINGS: Patient rotated to the RIGHT. Osseous mineralization normal. Avascular necrosis changes seen at the femoral heads bilaterally. Mild height loss/irregularity at the cranial margin of the LEFT femoral head is seen. Minimal flattening at the cranial margin of the RIGHT femoral head is a chronic defect as well. No new fracture, dislocation or bone destruction. IMPRESSION: Avascular necrosis changes of the femoral heads bilaterally, more severe on LEFT. Electronically Signed   By: Lavonia Dana M.D.   On: 07/24/2021 13:32   DG Chest Portable 1 View  Result Date: 07/24/2021 CLINICAL DATA:  BILATERAL hip pains, unresponsive, hypoglycemia, history avascular necrosis EXAM: PORTABLE CHEST 1 VIEW COMPARISON:  Portable exam 1258 hours compared to 04/15/2021 FINDINGS: Dual lumen RIGHT  jugular central venous catheter with tip projecting over cavoatrial junction. Normal heart size, mediastinal contours, and pulmonary vascularity. Lungs clear. No pulmonary infiltrate, pleural effusion, or pneumothorax. Surgical clips LEFT infraclavicular with vascular stent at the LEFT axilla. Bones demineralized. IMPRESSION: No acute abnormalities. Electronically Signed   By: Lavonia Dana M.D.   On: 07/24/2021 13:31    Assessment & Plan by Problem: Principal Problem:   Altered mental status Active Problems:   Adrenal insufficiency (Addison's disease) ([place])  Marvin Roberson is a 50 y.o. male, with pertinent PMHx of ESRD on HD MWF, bilateral avascular necrosis, adrenal insufficiency, HFrEF on coreg 25mg  BID, cirrhosis, hypertension on Norvasc, hyperlipidemia on Lipitor, past CVA, presented today after being found unresponsive in bed, with his eyes open and soaked in his urine.   #Acute Encephalopathy From history above, concern for possible stroke/TIA vs. provoked seizure vs. adrenal insufficiency vs. accidental overdose. Patient has reported history of CVA from 10 years ago and endorsed medical noncompliance.  Has not taken home meds for past 4 to 5 days, also missed his dialysis on Monday.  He is afebrile, vital signs was remarkable for systolic 315-400 and diastolic 867-619. Admitting blood glucose 27, sodium 129, potassium 4.8, no leukocytosis. Chest x-ray unremarkable.  On interview patient was alert and oriented, however had scanning speech, was not able to answer questions directly, and decreased sensation over the right forehead. No Marvin neuro deficits noted. Was not able to assess lower extremity due to pain.  Neurology consulted, appreciate recommendations.   - CBC, BMP, LFTs, Magnesium - UA - Acetaminophen and salicylate level - UDS - TSH - Head MRI - EEG - Neurology consulted, appreciate recommendations  #ESRD on HD MWF Missed HD on Monday (9/19), last HD on Friday (9/16).  Able to  produce urine. Nephrology consulted, appreciate recommendations  #Normocytic anemia Hemoglobin 8.5, likely from yesterday. - Iron panel - Ferritin - Trend CBC  #Bilateral hip avascular necrosis Reported having an orthopedic appointment today, however was not able to attend due to AMS.  #Cirrhosis Reported history of cirrhosis.  Platelets 104. - INR - CBC  Dispo: Admit patient to Inpatient with expected length of stay greater than 2 midnights.  Signed: Merrily Brittle, DO 07/25/2021, 6:04 AM  Pager: [phone] After 5pm on  weekdays and 1pm on weekends: On Call pager: [phone]

## 2021-07-24 NOTE — ED Notes (Signed)
Pt drowsy, had hypoglycemic episode this AM, cousin at bedside reports he has not been eating/no appetite. Pt also has hx of chronic leg/hip pain for which he is supposed to have orthopedic surgery in the future. Pt refused CXR and other imaging initially, continues to refuse at this time, yelled at nursing staff "why you gotta do the same xray they just did on Saturday!" Continues to refuse. RN gave IV meds and repositioned.

## 2021-07-24 NOTE — ED Notes (Signed)
Went in to round on pt. EEG technician at bedside preparing for EEG on pt. Pt's family member at bedside as well. Will return after EEG to reassess/round on pt.

## 2021-07-25 ENCOUNTER — Encounter (HOSPITAL_COMMUNITY): Payer: Self-pay | Admitting: Internal Medicine

## 2021-07-25 ENCOUNTER — Inpatient Hospital Stay (HOSPITAL_COMMUNITY): Payer: Medicare Other

## 2021-07-25 DIAGNOSIS — R4182 Altered mental status, unspecified: Secondary | ICD-10-CM

## 2021-07-25 DIAGNOSIS — I5022 Chronic systolic (congestive) heart failure: Secondary | ICD-10-CM | POA: Diagnosis present

## 2021-07-25 DIAGNOSIS — I69998 Other sequelae following unspecified cerebrovascular disease: Secondary | ICD-10-CM | POA: Diagnosis not present

## 2021-07-25 DIAGNOSIS — E162 Hypoglycemia, unspecified: Secondary | ICD-10-CM | POA: Diagnosis present

## 2021-07-25 DIAGNOSIS — Z885 Allergy status to narcotic agent status: Secondary | ICD-10-CM | POA: Diagnosis not present

## 2021-07-25 DIAGNOSIS — Z888 Allergy status to other drugs, medicaments and biological substances status: Secondary | ICD-10-CM | POA: Diagnosis not present

## 2021-07-25 DIAGNOSIS — R569 Unspecified convulsions: Secondary | ICD-10-CM

## 2021-07-25 DIAGNOSIS — Z992 Dependence on renal dialysis: Secondary | ICD-10-CM | POA: Diagnosis not present

## 2021-07-25 DIAGNOSIS — H538 Other visual disturbances: Secondary | ICD-10-CM | POA: Diagnosis present

## 2021-07-25 DIAGNOSIS — M87852 Other osteonecrosis, left femur: Secondary | ICD-10-CM | POA: Diagnosis present

## 2021-07-25 DIAGNOSIS — Z7401 Bed confinement status: Secondary | ICD-10-CM | POA: Diagnosis not present

## 2021-07-25 DIAGNOSIS — T402X1A Poisoning by other opioids, accidental (unintentional), initial encounter: Secondary | ICD-10-CM | POA: Diagnosis present

## 2021-07-25 DIAGNOSIS — N2581 Secondary hyperparathyroidism of renal origin: Secondary | ICD-10-CM | POA: Diagnosis present

## 2021-07-25 DIAGNOSIS — D6959 Other secondary thrombocytopenia: Secondary | ICD-10-CM | POA: Diagnosis present

## 2021-07-25 DIAGNOSIS — Z20822 Contact with and (suspected) exposure to covid-19: Secondary | ICD-10-CM | POA: Diagnosis present

## 2021-07-25 DIAGNOSIS — N186 End stage renal disease: Secondary | ICD-10-CM | POA: Diagnosis present

## 2021-07-25 DIAGNOSIS — E271 Primary adrenocortical insufficiency: Secondary | ICD-10-CM | POA: Diagnosis present

## 2021-07-25 DIAGNOSIS — M87851 Other osteonecrosis, right femur: Secondary | ICD-10-CM | POA: Diagnosis present

## 2021-07-25 DIAGNOSIS — Z881 Allergy status to other antibiotic agents status: Secondary | ICD-10-CM | POA: Diagnosis not present

## 2021-07-25 DIAGNOSIS — K219 Gastro-esophageal reflux disease without esophagitis: Secondary | ICD-10-CM | POA: Diagnosis present

## 2021-07-25 DIAGNOSIS — E7800 Pure hypercholesterolemia, unspecified: Secondary | ICD-10-CM | POA: Diagnosis present

## 2021-07-25 DIAGNOSIS — D631 Anemia in chronic kidney disease: Secondary | ICD-10-CM | POA: Diagnosis present

## 2021-07-25 DIAGNOSIS — K7460 Unspecified cirrhosis of liver: Secondary | ICD-10-CM | POA: Diagnosis present

## 2021-07-25 DIAGNOSIS — Z79899 Other long term (current) drug therapy: Secondary | ICD-10-CM | POA: Diagnosis not present

## 2021-07-25 DIAGNOSIS — Z91128 Patient's intentional underdosing of medication regimen for other reason: Secondary | ICD-10-CM | POA: Diagnosis not present

## 2021-07-25 DIAGNOSIS — I132 Hypertensive heart and chronic kidney disease with heart failure and with stage 5 chronic kidney disease, or end stage renal disease: Secondary | ICD-10-CM | POA: Diagnosis present

## 2021-07-25 LAB — LIPID PANEL
Cholesterol: 148 mg/dL (ref 0–200)
HDL: 32 mg/dL — ABNORMAL LOW (ref >40)
LDL Cholesterol: 93 mg/dL (ref 0–99)
Total CHOL/HDL Ratio: 4.6 RATIO
Triglycerides: 117 mg/dL (ref <150)
VLDL: 23 mg/dL (ref 0–40)

## 2021-07-25 LAB — CBC
HCT: 25.7 % — ABNORMAL LOW (ref 39.0–52.0)
Hemoglobin: 8.3 g/dL — ABNORMAL LOW (ref 13.0–17.0)
MCH: 30.3 pg (ref 26.0–34.0)
MCHC: 32.3 g/dL (ref 30.0–36.0)
MCV: 93.8 fL (ref 80.0–100.0)
Platelets: 113 10*3/uL — ABNORMAL LOW (ref 150–400)
RBC: 2.74 MIL/uL — ABNORMAL LOW (ref 4.22–5.81)
RDW: 17.3 % — ABNORMAL HIGH (ref 11.5–15.5)
WBC: 3.7 10*3/uL — ABNORMAL LOW (ref 4.0–10.5)
nRBC: 0 % (ref 0.0–0.2)

## 2021-07-25 LAB — COMPREHENSIVE METABOLIC PANEL
ALT: 8 U/L (ref 0–44)
AST: 22 U/L (ref 15–41)
Albumin: 2.5 g/dL — ABNORMAL LOW (ref 3.5–5.0)
Alkaline Phosphatase: 211 U/L — ABNORMAL HIGH (ref 38–126)
Anion gap: 16 — ABNORMAL HIGH (ref 5–15)
BUN: 76 mg/dL — ABNORMAL HIGH (ref 6–20)
CO2: 23 mmol/L (ref 22–32)
Calcium: 8.1 mg/dL — ABNORMAL LOW (ref 8.9–10.3)
Chloride: 90 mmol/L — ABNORMAL LOW (ref 98–111)
Creatinine, Ser: 16.27 mg/dL — ABNORMAL HIGH (ref 0.61–1.24)
GFR, Estimated: 3 mL/min — ABNORMAL LOW (ref >60)
Glucose, Bld: 183 mg/dL — ABNORMAL HIGH (ref 70–99)
Potassium: 5.9 mmol/L — ABNORMAL HIGH (ref 3.5–5.1)
Sodium: 129 mmol/L — ABNORMAL LOW (ref 135–145)
Total Bilirubin: 1.2 mg/dL (ref 0.3–1.2)
Total Protein: 8.6 g/dL — ABNORMAL HIGH (ref 6.5–8.1)

## 2021-07-25 LAB — HIV ANTIBODY (ROUTINE TESTING W REFLEX): HIV Screen 4th Generation wRfx: NONREACTIVE

## 2021-07-25 LAB — CBG MONITORING, ED: Glucose-Capillary: 167 mg/dL — ABNORMAL HIGH (ref 70–99)

## 2021-07-25 LAB — HEPATITIS B SURFACE ANTIGEN: Hepatitis B Surface Ag: NONREACTIVE

## 2021-07-25 LAB — GLUCOSE, CAPILLARY: Glucose-Capillary: 163 mg/dL — ABNORMAL HIGH (ref 70–99)

## 2021-07-25 LAB — ACETAMINOPHEN LEVEL: Acetaminophen (Tylenol), Serum: <10 ug/mL — ABNORMAL LOW (ref 10–30)

## 2021-07-25 LAB — PROTIME-INR
INR: 1.2 (ref 0.8–1.2)
Prothrombin Time: 14.8 seconds (ref 11.4–15.2)

## 2021-07-25 LAB — RESP PANEL BY RT-PCR (FLU A&B, COVID) ARPGX2
Influenza A by PCR: NEGATIVE
Influenza B by PCR: NEGATIVE
SARS Coronavirus 2 by RT PCR: NEGATIVE

## 2021-07-25 LAB — TSH: TSH: 0.845 u[IU]/mL (ref 0.350–4.500)

## 2021-07-25 LAB — SALICYLATE LEVEL: Salicylate Lvl: <7 mg/dL — ABNORMAL LOW (ref 7.0–30.0)

## 2021-07-25 LAB — IRON AND TIBC: Iron: 79 ug/dL (ref 45–182)

## 2021-07-25 LAB — FERRITIN: Ferritin: 5356 ng/mL — ABNORMAL HIGH (ref 24–336)

## 2021-07-25 LAB — HEPATITIS B SURFACE ANTIBODY,QUALITATIVE: Hep B S Ab: REACTIVE — AB

## 2021-07-25 MED ORDER — LIDOCAINE-PRILOCAINE 2.5-2.5 % EX CREA: 1.0000 "application " | TOPICAL_CREAM | CUTANEOUS | Status: DC | PRN

## 2021-07-25 MED ORDER — DARBEPOETIN ALFA 200 MCG/0.4ML IJ SOSY
200.0000 ug | PREFILLED_SYRINGE | INTRAMUSCULAR | Status: DC
  Administered 2021-07-25: 200 ug via INTRAVENOUS

## 2021-07-25 MED ORDER — LIDOCAINE HCL (PF) 1 % IJ SOLN: 5.0000 mL | INTRAMUSCULAR | Status: DC | PRN

## 2021-07-25 MED ORDER — SODIUM CHLORIDE 0.9 % IV SOLN: 100.0000 mL | INTRAVENOUS | Status: DC | PRN

## 2021-07-25 MED ORDER — PENTAFLUOROPROP-TETRAFLUOROETH EX AERO: 1.0000 "application " | INHALATION_SPRAY | CUTANEOUS | Status: DC | PRN

## 2021-07-25 MED ORDER — HYDROMORPHONE HCL 2 MG PO TABS
1.0000 mg | ORAL_TABLET | ORAL | Status: DC | PRN
  Administered 2021-07-25 – 2021-07-26 (×2): 1 mg via ORAL
  Filled 2021-07-25 (×2): qty 1

## 2021-07-25 MED ORDER — AMLODIPINE BESYLATE 10 MG PO TABS
10.0000 mg | ORAL_TABLET | Freq: Every day | ORAL | Status: DC
  Administered 2021-07-25: 10 mg via ORAL
  Filled 2021-07-25: qty 1

## 2021-07-25 MED ORDER — HEPARIN SODIUM (PORCINE) 1000 UNIT/ML DIALYSIS
1000.0000 [IU] | INTRAMUSCULAR | Status: DC | PRN
  Filled 2021-07-25 (×2): qty 1

## 2021-07-25 MED ORDER — ALTEPLASE 2 MG IJ SOLR: 2.0000 mg | Freq: Once | INTRAMUSCULAR | Status: DC | PRN

## 2021-07-25 MED ORDER — HYDROMORPHONE HCL 2 MG PO TABS
1.0000 mg | ORAL_TABLET | Freq: Once | ORAL | Status: AC
  Administered 2021-07-25: 1 mg via ORAL
  Filled 2021-07-25: qty 1

## 2021-07-25 MED ORDER — DARBEPOETIN ALFA 200 MCG/0.4ML IJ SOSY
PREFILLED_SYRINGE | INTRAMUSCULAR | Status: AC
  Filled 2021-07-25: qty 0.4

## 2021-07-25 MED ORDER — HYDRALAZINE HCL 50 MG PO TABS
100.0000 mg | ORAL_TABLET | Freq: Three times a day (TID) | ORAL | Status: DC
  Administered 2021-07-25 – 2021-07-26 (×3): 100 mg via ORAL
  Filled 2021-07-25 (×3): qty 2

## 2021-07-25 NOTE — Consult Note (Addendum)
[facility] Renal Consultation Note    Indication for Consultation:  Management of ESRD/hemodialysis; anemia, hypertension/volume and secondary hyperparathyroidism  KYH:CWCBJ, [name]., FNP  HPI: Emerson Schreifels is a 50 y.o. male. ESRD on HD MWF at [facility].  Past medical history significant for adrenal insufficiency, GERD, b/l avascular necrosis, HFrEF, cirrhosis, HTN, HLD and Hx CVA.  Of note seen in ED on 9/17 for hip pain and was discharged on Robaxin, vicodin and lidoderm.   Patient seen and examined at bedside.  Part of history obtained through chart review as patient continues to be altered.  Brought to ED yesterday after being found unresponsive in bed at home covered in urine.  Reports he has been very forgetful lately and having trouble getting his thoughts together.  Reports it reminds him of his previous stroke where "it took 3 months for me to wake up."  States he has been missing dialysis some lately due to pain.  He has not been taking any of his medication, except for pain medication he received from ED, due to other medications being in storage in preparation for a move that has now been delayed until November 1.  Admits to HA on and off for a few days, back pain and b/l hip pain.  Per chart review his friend he has been bedridden due to pain.   Pertinent findings during work up include hypertension, K 5.9, BUN 76, SCr 16.27, ferritin 5.356, CXR with no acute findings, pelvic xray with avascular necrosis changes of the femoral heads bilaterally L>R.  Patient admitted for further evaluation and management.   Past Medical History:  Diagnosis Date   Adrenal insufficiency (HCC)    Allergy    Anemia    Anxiety    Depression    Dyspnea    "when I have too much fluid"   ESRD on dialysis [facility]) since 1990s   "M,W,F; [address]" (11/27/2016)   GERD (gastroesophageal reflux disease)    Heart failure with reduced ejection fraction (HCC)    High cholesterol    History  of blood transfusion    Hypertension    Kidney failure    Renal insufficiency    Stroke ([place]) 2016   decreased vision in his left eye/notes 11/27/2016   Tricuspid regurgitation    Past Surgical History:  Procedure Laterality Date   ANGIOPLASTY Left 12/11/2016   Procedure: ANGIOPLASTY LEFT ARM & SUBCLAVIAN ARTERY;  Surgeon: Conrad Chili, MD;  Location: [place];  Service: Vascular;  Laterality: Left;   arm surgery Left 2016   "for aneurysm"   AV FISTULA PLACEMENT     FALSE ANEURYSM REPAIR Left 12/11/2016   Procedure: RESECTION  BRACHIAL ARTERY;  Surgeon: Conrad Eastlake, MD;  Location: [place];  Service: Vascular;  Laterality: Left;   FALSE ANEURYSM REPAIR Left 01/14/2018   Procedure: REPAIR FALSE ANEURYSM LEFT BRACHIAL ARTERY WITH SAPHENOUS VEIN  LEFT ARTERIOVENOUS GRAFT;  Surgeon: Rosetta Posner, MD;  Location: [place];  Service: Vascular;  Laterality: Left;   INGUINAL HERNIA REPAIR Left 11/23/2020   Procedure: LAPAROSCOPIC LEFT INGUINAL HERNIA REPAIR WITH MESH;  Surgeon: Ralene Ok, MD;  Location: [place];  Service: General;  Laterality: Left;   INSERTION OF DIALYSIS CATHETER Right    chest   IR FLUORO GUIDE CV LINE RIGHT  11/17/2019   THROMBECTOMY BRACHIAL ARTERY  12/11/2016   Procedure: THROMBECTOMY BRACHIAL ARTERY AND ULNAR;  Surgeon: Conrad San Miguel, MD;  Location: [place];  Service: Vascular;;  THYROIDECTOMY, PARTIAL     UPPER EXTREMITY ANGIOGRAM Left 12/11/2016   Procedure: LEFT ARM ANGIOGRAM;  Surgeon: Conrad Gibsonburg, MD;  Location: [facility] OR;  Service: Vascular;  Laterality: Left;   VEIN HARVEST Left 01/14/2018   Procedure: VEIN HARVEST LEFT SAPHENOUS VEIN;  Surgeon: Rosetta Posner, MD;  Location: [facility] OR;  Service: Vascular;  Laterality: Left;   WOUND EXPLORATION Left 12/11/2016   Procedure: LEFT ARM BRACHIAL ARTERY WITH INTERPOSTIONAL GRAFT;  Surgeon: Conrad [place], MD;  Location: [place];  Service: Vascular;  Laterality: Left;   Family History  Problem Relation Age of Onset   Hypertension Other    Cancer  Brother        in leg and lungs   Heart attack Mother    Adrenal disorder Neg Hx    Colon cancer Neg Hx    Rectal cancer Neg Hx    Stomach cancer Neg Hx    Social History:  reports that he has been smoking cigarettes. He has a 5.00 pack-year smoking history. He has never used smokeless tobacco. He reports that he does not currently use alcohol. He reports that he does not use drugs. Allergies  Allergen Reactions   Doxycycline Shortness Of Breath   Codeine Nausea And Vomiting   Gabapentin Other (See Comments)    Sleepiness, altered mental state   Lisinopril Other (See Comments) and Swelling    angioedema   Povidone-Iodine Itching   Betadine [Povidone Iodine] Rash   Prior to Admission medications   Medication Sig Start Date End Date Taking? Authorizing Provider  amLODipine (NORVASC) 10 MG tablet Take 10 mg by mouth at bedtime.  01/07/19  Yes [provider]  atorvastatin (LIPITOR) 40 MG tablet Take 40 mg by mouth at bedtime.  01/27/16  Yes [provider]  B Complex-C-Folic Acid (DIALYVITE PO) Take 1 tablet by mouth daily.   Yes [provider]  calcium acetate (PHOSLO) 667 MG capsule Take 1,334 mg by mouth daily after breakfast. 01/08/19  Yes [provider]  carvedilol (COREG) 25 MG tablet Take 25 mg by mouth 2 (two) times daily with a meal.   Yes [provider]  cyclobenzaprine (FLEXERIL) 5 MG tablet Take 1 tablet (5 mg total) by mouth 3 (three) times daily as needed for muscle spasms. 07/03/21  Yes Meccariello, Bernita Raisin, DO  hydrALAZINE (APRESOLINE) 100 MG tablet Take 100 mg by mouth 2 (two) times daily.   Yes [provider]  HYDROcodone-acetaminophen (NORCO/VICODIN) 5-325 MG tablet Take 2 tablets by mouth every 4 (four) hours as needed. Dont take with tramadol Patient taking differently: Take 2 tablets by mouth every 4 (four) hours as needed for moderate pain. 07/21/21  Yes Jeanell Sparrow, DO  hydrocortisone (CORTEF) 5 MG tablet Take  5 mg by mouth daily.    Yes [provider]  isosorbide dinitrate (ISORDIL) 10 MG tablet Take 1 tablet (10 mg total) by mouth 3 (three) times daily. Patient taking differently: Take 10 mg by mouth 2 (two) times daily. 11/16/20  Yes Chandrasekhar, Mahesh A, MD  lidocaine (LIDODERM) 5 % Place 1 patch onto the skin daily as needed. Remove & Discard patch within 12 hours or as directed by MD Patient taking differently: Place 1 patch onto the skin daily as needed (pain). Remove & Discard patch within 12 hours or as directed by MD 07/21/21  Yes Jeanell Sparrow, DO  Methoxy PEG-Epoetin Beta (MIRCERA IJ) Mircera 01/15/21 01/14/22 Yes [provider]  pantoprazole (  PROTONIX) 40 MG tablet Take 40 mg by mouth daily. 12/25/20  Yes [provider]  methocarbamol (ROBAXIN) 500 MG tablet Take 2 tablets (1,000 mg total) by mouth 2 (two) times daily. Patient not taking: Reported on 07/24/2021 07/21/21   Jeanell Sparrow, DO   Current Facility-Administered Medications  Medication Dose Route Frequency Provider Last Rate Last Admin   0.9 %  sodium chloride infusion  250 mL Intravenous PRN Langston Masker, Carola Rhine, MD       acetaminophen (TYLENOL) tablet 650 mg  650 mg Oral Q6H PRN Sanjuan Dame, MD       Or   acetaminophen (TYLENOL) suppository 650 mg  650 mg Rectal Q6H PRN Sanjuan Dame, MD       atorvastatin (LIPITOR) tablet 40 mg  40 mg Oral Daily Sanjuan Dame, MD   40 mg at 07/25/21 1235   Chlorhexidine Gluconate Cloth 2 % PADS 6 each  6 each Topical Q0600 Reesa Chew, MD   6 each at 07/25/21 1216   Darbepoetin Alfa (ARANESP) 200 MCG/0.4ML injection            Darbepoetin Alfa (ARANESP) injection 200 mcg  200 mcg Intravenous Q Wed-HD Glen Blatchley, Ria Comment, PA   200 mcg at 07/25/21 1141   heparin injection 5,000 Units  5,000 Units Subcutaneous Q8H Sanjuan Dame, MD       pantoprazole (PROTONIX) EC tablet 40 mg  40 mg Oral Daily Sanjuan Dame, MD   40 mg at 07/25/21 1235    senna-docusate (Senokot-S) tablet 1 tablet  1 tablet Oral QHS PRN Sanjuan Dame, MD       sodium chloride flush (NS) 0.9 % injection 3 mL  3 mL Intravenous Q12H Wyvonnia Dusky, MD   3 mL at 07/25/21 1237   sodium chloride flush (NS) 0.9 % injection 3 mL  3 mL Intravenous PRN Wyvonnia Dusky, MD       Labs: Basic Metabolic Panel: Recent Labs  Lab 07/21/21 2047 07/24/21 1225 07/25/21 0542  NA 134* 129* 129*  K 4.3 4.8 5.9*  CL 93* 90* 90*  CO2 28 22 23   GLUCOSE 62* 187* 183*  BUN 37* 64* 76*  CREATININE 10.88* 15.44* 16.27*  CALCIUM 8.2* 7.9* 8.1*   Liver Function Tests: Recent Labs  Lab 07/24/21 1225 07/25/21 0542  AST 34 22  ALT 10 8  ALKPHOS 203* 211*  BILITOT 1.4* 1.2  PROT 8.0 8.6*  ALBUMIN 2.5* 2.5*   CBC: Recent Labs  Lab 07/21/21 2047 07/24/21 1225 07/25/21 0542  WBC 5.1 7.0 3.7*  NEUTROABS  --  5.0  --   HGB 7.9* 8.5* 8.3*  HCT 25.2* 26.3* 25.7*  MCV 95.5 93.9 93.8  PLT PLATELET CLUMPS NOTED ON SMEAR, UNABLE TO ESTIMATE 104* 113*   CBG: Recent Labs  Lab 07/24/21 1254 07/24/21 1329 07/24/21 1539 07/25/21 0631 07/25/21 1135  GLUCAP 86 93 156* 167* 163*   Iron Studies:  Recent Labs    07/25/21 0542  IRON 79  TIBC NOT CALCULATED  FERRITIN 5,356*   Studies/Results: DG Pelvis Portable  Result Date: 07/24/2021 CLINICAL DATA:  Avascular necrosis, BILATERAL hip pain EXAM: PORTABLE PELVIS 1-2 VIEWS COMPARISON:  Portable exam 1301 hours compared to CT abdomen and pelvis 01/27/2020 FINDINGS: Patient rotated to the RIGHT. Osseous mineralization normal. Avascular necrosis changes seen at the femoral heads bilaterally. Mild height loss/irregularity at the cranial margin of the LEFT femoral head is seen. Minimal flattening at the cranial margin of the RIGHT femoral head  is a chronic defect as well. No new fracture, dislocation or bone destruction. IMPRESSION: Avascular necrosis changes of the femoral heads bilaterally, more severe on LEFT.  Electronically Signed   By: Lavonia Dana M.D.   On: 07/24/2021 13:32   DG Chest Portable 1 View  Result Date: 07/24/2021 CLINICAL DATA:  BILATERAL hip pains, unresponsive, hypoglycemia, history avascular necrosis EXAM: PORTABLE CHEST 1 VIEW COMPARISON:  Portable exam 1258 hours compared to 04/15/2021 FINDINGS: Dual lumen RIGHT jugular central venous catheter with tip projecting over cavoatrial junction. Normal heart size, mediastinal contours, and pulmonary vascularity. Lungs clear. No pulmonary infiltrate, pleural effusion, or pneumothorax. Surgical clips LEFT infraclavicular with vascular stent at the LEFT axilla. Bones demineralized. IMPRESSION: No acute abnormalities. Electronically Signed   By: Lavonia Dana M.D.   On: 07/24/2021 13:31   EEG adult  Result Date: 07/25/2021 Lora Havens, MD     07/25/2021  8:54 AM Patient Name: Abrar Bilton MRN: [id number] Epilepsy Attending: Lora Havens Referring Physician/Provider: Anibal Henderson, NP Date: 07/24/2021 Duration: 26.34 mins Patient history: 50 y.o. male, with pertinent PMHx of ESRD on HD MWF, bilateral avascular necrosis, adrenal insufficiency, HFrEF on coreg 25mg  BID, cirrhosis, hypertension on Norvasc, hyperlipidemia on Lipitor, past CVA, presented today after being found unresponsive in bed, with his eyes open and soaked in his urine. EEG to evaluate for seizure. Level of alertness: Awake, AEDs during EEG study: None Technical aspects: This EEG study was done with scalp electrodes positioned according to the 10-20 International system of electrode placement. Electrical activity was acquired at a sampling rate of 500Hz  and reviewed with a high frequency filter of 70Hz  and a low frequency filter of 1Hz . EEG data were recorded continuously and digitally stored. Description: No posterior dominant rhythm was seen. EEG showed continuous generalized polymorphic predominantly 6  to 8 Hz theta-alpha activity admixed with intermittent 2-3Hz  delta slowing, at  times with triphasic morphology Physiologic photic driving was not seen during photic stimulation. Hyperventilation was not performed.   ABNORMALITY - Continuous slow, generalized IMPRESSION: This study is suggestive of mild to moderate diffuse encephalopathy. No seizures or definite epileptiform discharges were seen throughout the recording. Priyanka O Yadav    ROS: All others negative except those listed in HPI.  Physical Exam: Vitals:   07/25/21 1030 07/25/21 1100 07/25/21 1123 07/25/21 1218  BP: (!) 155/84 (!) 179/78 (!) 177/95 (!) 196/146  Pulse: 83 85 77 86  Resp:   16 14  Temp:   97.9 F (36.6 C) 98.9 F (37.2 C)  TempSrc:   Oral   SpO2:   97% 98%  Weight:      Height:         General: WDWN male NAD Head: NCAT sclera not icteric MMM Neck: Supple. No lymphadenopathy Lungs: CTA bilaterally. No wheeze, rales or rhonchi. Breathing is unlabored. Heart: RRR. No murmur, rubs or gallops.  Abdomen: soft, nontender, +BS, no guarding, no rebound tenderness Lower extremities:no edema, ischemic changes, or open wounds  Neuro: AAOx3. Not at baseline, speech slowed. Moves all extremities spontaneously. Psych:  Responds to questions appropriately with a normal affect. Dialysis Access:TDC  Dialysis Orders:  MWF - [place]  3.5hrs, BFR 350, DFR 500,  EDW 58kg, 2K/ 2Ca  Access: TDC  Heparin 2000 + 2000 intermittent Mircera 225 mcg q2wks - last 253mcg on 6/27 Hectorol 72mcg IV qHD     Assessment/Plan:  Acute Encephalopathy - etiology unclear. Hx stroke, MRI ordered.  Hx adrenal insufficiency.  EEG with no seizures.  Ongoing work up per PMD.   ESRD -  on HD MWF. Missed and shortened treatments recently.  HD completed today.  Plan for next HD on 9/23.   Hypertension/volume  - BP elevated, has not taken home meds in several days: on amlodipine 10mg  qd, carvedilol 25mg  BID and hydralazine 100mg  TID - does not look like he has picked up carvedilol lately, will start the other 2 for now.  Does not  appear volume overloaded.  Likely has had weight loss due to decreased appetite.  UF as tolerated.   Anemia of CKD - Hgb 8.3. No iron d/t high ferritin.  Aranesp 26mcg given with HD today.   Secondary Hyperparathyroidism -  Ca in goal. Will check phos.  Continue binders and VDRA.  Nutrition - Renal diet w/Fluid restrictions.  B/l hip avascular necrosis - needs ortho appt Cirrhosis Adrenal insufficiency   Jen Mow, PA-C [facility] 07/25/2021, 1:18 PM

## 2021-07-25 NOTE — Progress Notes (Signed)
Subjective:  HD 0   24Hr Events: Patient refused MRI despite 0.5mg  ativan  Patient was seen this AM at HD. He continues to endorse bilateral hip pain. Reported that on Monday night, he took 2 vicodins for his pain before bed and then being in the ED. Did remember feeling confused and speech scanning yesterday. He refused MRI again today due to fear of small spaces, but is amendable to head CT.  He denied CP, SOB, new abdominal pain, dysuria, frequency, urgency, suprapubic tenderness.   Objective:  Vital signs in last 24 hours: Vitals:   07/25/21 1123 07/25/21 1218 07/25/21 1512 07/25/21 1619  BP: (!) 177/95 (!) 196/146 (!) 165/115 (!) 153/100  Pulse: 77 86  73  Resp: 16 14  14   Temp: 97.9 F (36.6 C) 98.9 F (37.2 C)  98.5 F (36.9 C)  TempSrc: Oral   Oral  SpO2: 97% 98%  99%  Weight:      Height:       Supplemental O2: Room Air SpO2: 99 % Physical Exam Vitals and nursing note reviewed.  Constitutional:      Appearance: Normal appearance.  HENT:     Head: Normocephalic and atraumatic.  Eyes:     Extraocular Movements: Extraocular movements intact.  Cardiovascular:     Rate and Rhythm: Normal rate and regular rhythm.     Heart sounds: Normal heart sounds.  Pulmonary:     Effort: Pulmonary effort is normal.     Breath sounds: Normal breath sounds.  Abdominal:     General: Abdomen is flat. Bowel sounds are normal.     Palpations: Abdomen is soft.     Tenderness: There is no abdominal tenderness. There is no guarding.  Musculoskeletal:     Cervical back: Normal range of motion.  Skin:    General: Skin is warm and dry.  Neurological:     Mental Status: He is alert and oriented to person, place, and time.     Comments: Speech is clear, coherent, goal directed. No more scanning speech or slowed speech.   Assessment/Plan: Principal Problem:   Altered mental status Active Problems:   Adrenal insufficiency (Addison's disease) ([place])  Marvin Roberson is a 50 y.o. male,  with pertinent PMHx of ESRD on HD MWF, bilateral avascular necrosis, adrenal insufficiency, HFrEF on coreg 25mg  BID, cirrhosis, hypertension on Norvasc, hyperlipidemia on Lipitor, past CVA, presented today after being found unresponsive in bed, with his eyes open and soaked in his urine. HD 0  #Acute Encephalopathy Patient mental status has returned to baseline, with no events overnight. He is no longer confused and has no speech difficulties.  Currently considering provoked seizure due to hypoglycemia vs. oversedation from Vicodin due to decreased hepatic metabolism. Infectious etiology is unlikely because he has remained afebrile, with no leukocytosis and blood culture shows no growth after 12 hours. Was concerned for accidental overdose, however, salicylate and acetaminophen level were <7. ketones, and protein. However he denies any urinary symptoms.  No evidence of metabolic etiology found in labs. TSH within normal limits. Blood glucose of 157. UA was positive for glucose, hemoglobin.  EEG is suggestive of mild to moderate diffuse encephalopathy, no seizures or definite epileptiform discharges were seen throughout the recording. Patient refused head MRI, we will obtain head CT.  Neurology consulted, appreciate recommendations.   - Follow-up UDS - Head CT - EEG   #ESRD on HD MWF #Normocytic anemia Patient was tolerating HD well. Able to produce urine. Hemoglobin 8.3.  Likely 2/2 to ESRD. Iron levels wnl. Ferritin 5,355, likely due to inflammation of hips.  Nephrology consulted, appreciate recommendations. -Trend CBC -HD per nephrology   #Bilateral hip avascular necrosis Patient reported bilateral hip pain, that is consistent with finding on hip x-ray.  Likely secondary to chronic steroid use for adrenal insufficiency.  Pain has become nearly unbearable, where patient is unable to ambulate.  -PT/OT -Dilaudid 1 mg p.o. as needed   #Cirrhosis Reported history of cirrhosis with resulting  thrombocytopenia. Platelets 113, INR 1.2. HepB surface antibody is reactive and HepB antigen is non-reactive, no active infection. Indirect bilirubin 1.2, direct bilirubin 0.2. HIV antibody nonreactive. No further workup necessary. Will avoid hepatotoxic drugs.   Diet: Heart Healthy IVF: None,None VTE: Heparin Code: Full Code  PT/OT recs: Pending, none.  Prior to Admission Living Arrangement: Home Anticipated Discharge Location: SNF Dispo: Anticipated discharge to Skilled nursing facility in 1-2 days.   Merrily Brittle, DO 07/25/2021, 5:36 PM Pager: [phone] After 5pm on weekdays and 1pm on weekends: On Call pager [phone]

## 2021-07-25 NOTE — Progress Notes (Signed)
OT Cancellation Note  Patient Details Name: Tillman Kazmierski MRN: [id number] DOB: [birth year]   Cancelled Treatment:    Reason Eval/Treat Not Completed: Patient declined, had HD earlier, and just getting ready to eat.  Will continue efforts as appropriate.    Gavinn Collard D Adetokunbo Mccadden 07/25/2021, 3:25 PM

## 2021-07-25 NOTE — ED Notes (Signed)
Placed Breakfast order 

## 2021-07-25 NOTE — Plan of Care (Signed)
Pt received to unit from dialysis during am shift, reports chronic pain to right knee, prn pain medication ordered, limited movement of rle, cane in use at bedside, pt denies recent falls, oliguria noted, dialysis mwf, Right HD cath, call bell within reach, bed in lowest and locked position, sbar to be provided to on coming nurse.   Problem: Education: Goal: Knowledge of General Education information will improve Description: Including pain rating scale, medication(s)/side effects and non-pharmacologic comfort measures Outcome: Progressing   Problem: Health Behavior/Discharge Planning: Goal: Ability to manage health-related needs will improve 07/25/2021 1834 by Adela Lank, RN Outcome: Progressing 07/25/2021 1425 by Adela Lank, RN Outcome: Progressing   Problem: Clinical Measurements: Goal: Ability to maintain clinical measurements within normal limits will improve 07/25/2021 1834 by Adela Lank, RN Outcome: Progressing 07/25/2021 1425 by Adela Lank, RN Outcome: Progressing Goal: Will remain free from infection 07/25/2021 1834 by Adela Lank, RN Outcome: Progressing 07/25/2021 1425 by Adela Lank, RN Outcome: Progressing Goal: Diagnostic test results will improve Outcome: Progressing Goal: Respiratory complications will improve Outcome: Progressing Goal: Cardiovascular complication will be avoided Outcome: Progressing   Problem: Activity: Goal: Risk for activity intolerance will decrease 07/25/2021 1834 by Adela Lank, RN Outcome: Progressing 07/25/2021 1425 by Adela Lank, RN Outcome: Progressing   Problem: Nutrition: Goal: Adequate nutrition will be maintained 07/25/2021 1834 by Adela Lank, RN Outcome: Progressing 07/25/2021 1425 by Adela Lank, RN Outcome: Progressing   Problem: Coping: Goal: Level of anxiety will decrease Outcome: Progressing   Problem: Elimination: Goal: Will not experience complications  related to bowel motility Outcome: Progressing Goal: Will not experience complications related to urinary retention Outcome: Progressing   Problem: Pain Managment: Goal: General experience of comfort will improve 07/25/2021 1834 by Adela Lank, RN Outcome: Progressing 07/25/2021 1425 by Adela Lank, RN Outcome: Progressing   Problem: Safety: Goal: Ability to remain free from injury will improve Outcome: Progressing   Problem: Skin Integrity: Goal: Risk for impaired skin integrity will decrease 07/25/2021 1834 by Adela Lank, RN Outcome: Progressing 07/25/2021 1425 by Adela Lank, RN Outcome: Progressing

## 2021-07-25 NOTE — Progress Notes (Signed)
PT Cancellation Note  Patient Details Name: Mathias Bogacki MRN: [id number] DOB: 06/16/1971   Cancelled Treatment:    Reason Eval/Treat Not Completed: Other (comment).  Dismissed PT from seeing him, stating did PT not know he had just arrived to his bed.  Will make another attempt at another time.   Ramond Dial 07/25/2021, 1:09 PM  Mee Hives, PT MS Acute Rehab Dept. Number: [place]

## 2021-07-25 NOTE — Procedures (Signed)
   I was present at this dialysis session, have reviewed the session itself and made  appropriate changes Kelly Splinter MD [place] pager [phone]   07/25/2021, 3:35 PM '

## 2021-07-25 NOTE — Progress Notes (Signed)
Date: 07/25/2021  Patient name: Marvin Roberson  Medical record number: [id number]  Date of birth: February 28, 1971   I have seen and evaluated Marvin Roberson and discussed their care with the Residency Team.  In brief, patient is a 50 year old male with a past medical history of ESRD on hemodialysis, bilateral avascular necrosis of the femoral heads, adrenal insufficiency, heart failure with mildly reduced ejection fraction of 45 to 50%, hypertension, hyperlipidemia, CVA, questionable history of cirrhosis who presented to the ED after being found unresponsive in his bed.  Patient states that he has been having worsening bilateral hip pain and was diagnosed with bilateral avascular necrosis of the femoral heads.  He was seen in the ED for worsening pain on September 17 and was discharged with Robaxin, Vicodin and Lidoderm patch.  Patient states that since his discharge he has had persistent hip pain which is limited his ability to ambulate and he missed his hemodialysis on Monday secondary to the pain.  Patient was at his baseline Monday night but the next morning he was found unresponsive in his bed, soaked with urine.  When he first presented to the ED patient did not remember what it happened and states he had just passed out.  He had difficulty with his speech at that time.  No chest pain, shortness of breath, palpitations, no lightheadedness, no focal weakness, no tingling or numbness, no nausea or vomiting, no abdominal pain, no diarrhea, no fevers or chills.  He was found to be severely hypoglycemic with a blood glucose of 27 on arrival.  Today, patient is at his baseline mental status and still complains of persistent bilateral hip pain.  PMHx, Fam Hx, and/or Soc Hx : As per resident admit note  Vitals:   07/25/21 1123 07/25/21 1218  BP: (!) 177/95 (!) 196/146  Pulse: 77 86  Resp: 16 14  Temp: 97.9 F (36.6 C) 98.9 F (37.2 C)  SpO2: 97% 98%   General: Awake, alert, oriented x3, NAD CVS: Regular rate  and rhythm, normal heart sounds Lungs: CTA bilaterally Abdomen: Soft, nontender, nondistended, normoactive bowel sounds Extremities: No edema noted, nontender to palpation Psych: Normal mood and affect HEENT: Normocephalic, atraumatic Skin: Warm and dry Neuro: Speech is normal and is oriented x3 currently  Assessment and Plan: I have seen and evaluated the patient as outlined above. I agree with the formulated Assessment and Plan as detailed in the residents' note, with the following changes:   1.  Acute encephalopathy: -Patient presented to ED after being found unresponsive in his bed and covered in urine in the setting of a history of worsening bilateral hip pain for which he was prescribed hydrocodone.  Etiology behind the patient's encephalopathy remains uncertain at this time.  It is possible that this is secondary to being on hydrocodone with a history of liver disease and may have resulted in an inadvertent overdose.  Given that patient did have urinary incontinence and was unresponsive when he was found it is possible that this episode was secondary to a seizure.  Patient was noted to have severe hypoglycemia which likely contributed to his encephalopathy and may have induced a hypoglycemic seizure. -Blood sugars are now stable in the 100s.  We will continue to monitor closely -EEG with no evidence of seizure activity -Patient unable to tolerate an MRI.  Would consider repeat CT head in a.m. -No evidence of an underlying infection at this time -Patient's Tylenol and salicylate level were within normal limits -We will follow up UDS -  Neurology consulted.  We will follow up recommendations -No further work-up at this time  2.  Hyperkalemia secondary to ESRD: -Patient has a history of ESRD on hemodialysis and missed his Monday hemodialysis session.  Patient's potassium is elevated to 5.9 today.  He is receiving hemodialysis today and I suspect this will correct with his hemodialysis -No  further work-up at this time -Recheck potassium level in a.m.  3.  Bilateral avascular necrosis of femoral heads: -Patient with persistent pain secondary to bilateral avascular necrosis of femoral heads.  He was started on hydrocodone for this which may have contributed to his encephalopathy. -We will obtain PT/OT evaluation -Patient is scheduled to have an orthopedic appointment today but will attempt to get Ortho to consult while in hospital -We will continue with pain control for now with Dilaudid as needed.  No further work-up at this time  Aldine Contes, MD 9/21/20221:41 PM

## 2021-07-25 NOTE — Procedures (Signed)
Patient Name: Dora Clauss  MRN: [id number]  Epilepsy Attending: Lora Havens  Referring Physician/Provider: Anibal Henderson, NP Date: 07/24/2021 Duration: 26.34 mins  Patient history: 50 y.o. male, with pertinent PMHx of ESRD on HD MWF, bilateral avascular necrosis, adrenal insufficiency, HFrEF on coreg 25mg  BID, cirrhosis, hypertension on Norvasc, hyperlipidemia on Lipitor, past CVA, presented today after being found unresponsive in bed, with his eyes open and soaked in his urine. EEG to evaluate for seizure.  Level of alertness: Awake,  AEDs during EEG study: None  Technical aspects: This EEG study was done with scalp electrodes positioned according to the 10-20 International system of electrode placement. Electrical activity was acquired at a sampling rate of 500Hz  and reviewed with a high frequency filter of 70Hz  and a low frequency filter of 1Hz . EEG data were recorded continuously and digitally stored.   Description: No posterior dominant rhythm was seen. EEG showed continuous generalized polymorphic predominantly 6  to 8 Hz theta-alpha activity admixed with intermittent 2-3Hz  delta slowing, at times with triphasic morphology Physiologic photic driving was not seen during photic stimulation. Hyperventilation was not performed.     ABNORMALITY - Continuous slow, generalized  IMPRESSION: This study is suggestive of mild to moderate diffuse encephalopathy.  No seizures or definite epileptiform discharges were seen throughout the recording.  Aundrey Elahi Barbra Sarks

## 2021-07-25 NOTE — ED Notes (Signed)
Pt uncooperative with receiving 2nd blood draw for blood cultures. Will attempt again with 5AM labs.

## 2021-07-25 NOTE — Progress Notes (Signed)
PT Cancellation Note  Patient Details Name: Marvin Roberson MRN: [id number] DOB: 25-Nov-1970   Cancelled Treatment:    Reason Eval/Treat Not Completed: Patient at procedure or test/unavailable.  Gone to HD, retry at another time.   Ramond Dial 07/25/2021, 10:14 AM Mee Hives, PT MS Acute Rehab Dept. Number: [place] [facility] [place]

## 2021-07-26 ENCOUNTER — Inpatient Hospital Stay (HOSPITAL_COMMUNITY): Payer: Medicare Other

## 2021-07-26 DIAGNOSIS — R4182 Altered mental status, unspecified: Secondary | ICD-10-CM | POA: Diagnosis not present

## 2021-07-26 LAB — COMPREHENSIVE METABOLIC PANEL
ALT: 8 U/L (ref 0–44)
AST: 21 U/L (ref 15–41)
Albumin: 2.5 g/dL — ABNORMAL LOW (ref 3.5–5.0)
Alkaline Phosphatase: 171 U/L — ABNORMAL HIGH (ref 38–126)
Anion gap: 11 (ref 5–15)
BUN: 43 mg/dL — ABNORMAL HIGH (ref 6–20)
CO2: 26 mmol/L (ref 22–32)
Calcium: 8.1 mg/dL — ABNORMAL LOW (ref 8.9–10.3)
Chloride: 95 mmol/L — ABNORMAL LOW (ref 98–111)
Creatinine, Ser: 9.22 mg/dL — ABNORMAL HIGH (ref 0.61–1.24)
GFR, Estimated: 6 mL/min — ABNORMAL LOW (ref >60)
Glucose, Bld: 138 mg/dL — ABNORMAL HIGH (ref 70–99)
Potassium: 4.5 mmol/L (ref 3.5–5.1)
Sodium: 132 mmol/L — ABNORMAL LOW (ref 135–145)
Total Bilirubin: 0.7 mg/dL (ref 0.3–1.2)
Total Protein: 8.1 g/dL (ref 6.5–8.1)

## 2021-07-26 LAB — CBC WITH DIFFERENTIAL/PLATELET
Abs Immature Granulocytes: 0.03 10*3/uL (ref 0.00–0.07)
Basophils Absolute: 0 10*3/uL (ref 0.0–0.1)
Basophils Relative: 0 %
Eosinophils Absolute: 0 10*3/uL (ref 0.0–0.5)
Eosinophils Relative: 0 %
HCT: 22.9 % — ABNORMAL LOW (ref 39.0–52.0)
Hemoglobin: 7.5 g/dL — ABNORMAL LOW (ref 13.0–17.0)
Immature Granulocytes: 1 %
Lymphocytes Relative: 6 %
Lymphs Abs: 0.3 10*3/uL — ABNORMAL LOW (ref 0.7–4.0)
MCH: 30.5 pg (ref 26.0–34.0)
MCHC: 32.8 g/dL (ref 30.0–36.0)
MCV: 93.1 fL (ref 80.0–100.0)
Monocytes Absolute: 0.4 10*3/uL (ref 0.1–1.0)
Monocytes Relative: 8 %
Neutro Abs: 4.1 10*3/uL (ref 1.7–7.7)
Neutrophils Relative %: 85 %
Platelets: 133 10*3/uL — ABNORMAL LOW (ref 150–400)
RBC: 2.46 MIL/uL — ABNORMAL LOW (ref 4.22–5.81)
RDW: 17.4 % — ABNORMAL HIGH (ref 11.5–15.5)
WBC: 4.7 10*3/uL (ref 4.0–10.5)
nRBC: 0 % (ref 0.0–0.2)

## 2021-07-26 LAB — HEPATITIS B SURFACE ANTIBODY, QUANTITATIVE: Hep B S AB Quant (Post): 277.5 m[IU]/mL (ref >9.9)

## 2021-07-26 MED ORDER — HYDRALAZINE HCL 100 MG PO TABS: 100.0000 mg | ORAL_TABLET | Freq: Three times a day (TID) | ORAL | 0 refills | Status: AC

## 2021-07-26 MED ORDER — CARVEDILOL 6.25 MG PO TABS: 6.2500 mg | ORAL_TABLET | Freq: Two times a day (BID) | ORAL | Status: DC

## 2021-07-26 MED ORDER — THIAMINE HCL 100 MG PO TABS: 100.0000 mg | ORAL_TABLET | Freq: Every day | ORAL | Status: DC

## 2021-07-26 MED ORDER — HYDROMORPHONE HCL 2 MG PO TABS: 1.0000 mg | ORAL_TABLET | Freq: Four times a day (QID) | ORAL | 0 refills | Status: AC | PRN

## 2021-07-26 MED ORDER — CHLORHEXIDINE GLUCONATE CLOTH 2 % EX PADS: 6.0000 | MEDICATED_PAD | Freq: Every day | CUTANEOUS | Status: DC

## 2021-07-26 MED ORDER — LORAZEPAM 2 MG/ML IJ SOLN: 1.0000 mg | INTRAMUSCULAR | Status: DC | PRN

## 2021-07-26 MED ORDER — ADULT MULTIVITAMIN W/MINERALS CH
1.0000 | ORAL_TABLET | Freq: Every day | ORAL | Status: DC
  Administered 2021-07-26: 1 via ORAL
  Filled 2021-07-26: qty 1

## 2021-07-26 MED ORDER — THIAMINE HCL 100 MG/ML IJ SOLN
100.0000 mg | Freq: Once | INTRAMUSCULAR | Status: AC
  Administered 2021-07-26: 100 mg via INTRAMUSCULAR
  Filled 2021-07-26: qty 2

## 2021-07-26 MED ORDER — LORAZEPAM 1 MG PO TABS: 1.0000 mg | ORAL_TABLET | ORAL | Status: DC | PRN

## 2021-07-26 MED ORDER — LORAZEPAM 1 MG PO TABS: 1.0000 mg | ORAL_TABLET | Freq: Every day | ORAL | Status: DC

## 2021-07-26 MED ORDER — HYDROCORTISONE 5 MG PO TABS
5.0000 mg | ORAL_TABLET | Freq: Every day | ORAL | Status: DC
  Administered 2021-07-26: 5 mg via ORAL
  Filled 2021-07-26: qty 1

## 2021-07-26 MED ORDER — HYDROXYZINE HCL 25 MG PO TABS: 25.0000 mg | ORAL_TABLET | Freq: Four times a day (QID) | ORAL | Status: DC | PRN

## 2021-07-26 MED ORDER — CARVEDILOL 6.25 MG PO TABS: 6.2500 mg | ORAL_TABLET | Freq: Two times a day (BID) | ORAL | 0 refills | Status: AC

## 2021-07-26 MED ORDER — LORAZEPAM 1 MG PO TABS: 1.0000 mg | ORAL_TABLET | Freq: Two times a day (BID) | ORAL | Status: DC

## 2021-07-26 MED ORDER — LORAZEPAM 1 MG PO TABS: 1.0000 mg | ORAL_TABLET | Freq: Four times a day (QID) | ORAL | Status: DC

## 2021-07-26 MED ORDER — FOLIC ACID 1 MG PO TABS
1.0000 mg | ORAL_TABLET | Freq: Every day | ORAL | Status: DC
  Administered 2021-07-26: 1 mg via ORAL
  Filled 2021-07-26: qty 1

## 2021-07-26 MED ORDER — LORAZEPAM 1 MG PO TABS: 1.0000 mg | ORAL_TABLET | Freq: Three times a day (TID) | ORAL | Status: DC

## 2021-07-26 MED ORDER — SENNOSIDES-DOCUSATE SODIUM 8.6-50 MG PO TABS: 1.0000 | ORAL_TABLET | Freq: Every evening | ORAL | 0 refills | Status: AC | PRN

## 2021-07-26 NOTE — Progress Notes (Signed)
PT Cancellation Note  Patient Details Name: Marvin Roberson MRN: [id number] DOB: [birth year]   Cancelled Treatment:    Reason Eval/Treat Not Completed: Other (comment) Pt adamantly declined PT.  Stated he walked to door and back with OT earlier.  Discussed PT vs OT and role of PT, but pt continued to decline PT evaluation today - does not want therapy to check back later today.  Will f/u at later date if pt agrees. Abran Richard, PT Acute Rehab Services Pager [phone] [facility] [place] 07/26/2021, 2:52 PM

## 2021-07-26 NOTE — Discharge Planning (Cosign Needed)
Name: Marvin Roberson MRN: [id number] DOB: 08-Dec-1970 50 y.o. PCP: Sonia Side., FNP  Date of Admission: 07/24/2021 11:09 AM Date of Discharge: 07/26/2021  3:45 PM Attending Physician: No att. providers found  Discharge Diagnosis: ESRD on HD MWF Normocytic anemia of ESRD Bilateral hip avascular necrosis  Cirrhosis  Secondary hyperparathyroidism Adrenal insufficiency on hydrocortisone 5 mg daily Hypertension on Norvasc 10 mg Hyperlipidemia on Lipitor 40 mg Remote history of HFrEF on Coreg 6.25 mg daily (45-50% 10/12/20) Remote history of CVA (with decreased left vision 03/2015)  Discharge Medications: Allergies as of 07/26/2021       Reactions   Doxycycline Shortness Of Breath   Codeine Nausea And Vomiting   Gabapentin Other (See Comments)   Sleepiness, altered mental state   Lisinopril Other (See Comments), Swelling   angioedema   Povidone-iodine Itching   Betadine [povidone Iodine] Rash        Medication List     STOP taking these medications    HYDROcodone-acetaminophen 5-325 MG tablet Commonly known as: NORCO/VICODIN       TAKE these medications    amLODipine 10 MG tablet Commonly known as: NORVASC Take 10 mg by mouth at bedtime.   atorvastatin 40 MG tablet Commonly known as: LIPITOR Take 40 mg by mouth at bedtime.   calcium acetate 667 MG capsule Commonly known as: PHOSLO Take 1,334 mg by mouth daily after breakfast.   carvedilol 6.25 MG tablet Commonly known as: COREG Take 1 tablet (6.25 mg total) by mouth 2 (two) times daily with a meal. What changed:  medication strength how much to take   cyclobenzaprine 5 MG tablet Commonly known as: FLEXERIL Take 1 tablet (5 mg total) by mouth 3 (three) times daily as needed for muscle spasms.   DIALYVITE PO Take 1 tablet by mouth daily.   hydrALAZINE 100 MG tablet Commonly known as: APRESOLINE Take 1 tablet (100 mg total) by mouth every 8 (eight) hours. What changed: when to take this    hydrocortisone 5 MG tablet Commonly known as: CORTEF Take 5 mg by mouth daily.   HYDROmorphone 2 MG tablet Commonly known as: DILAUDID Take 0.5 tablets (1 mg total) by mouth every 6 (six) hours as needed for up to 3 days for severe pain or moderate pain.   isosorbide dinitrate 10 MG tablet Commonly known as: ISORDIL Take 1 tablet (10 mg total) by mouth 3 (three) times daily. What changed: when to take this   lidocaine 5 % Commonly known as: Lidoderm Place 1 patch onto the skin daily as needed. Remove & Discard patch within 12 hours or as directed by MD What changed: reasons to take this   methocarbamol 500 MG tablet Commonly known as: ROBAXIN Take 2 tablets (1,000 mg total) by mouth 2 (two) times daily.   MIRCERA IJ Mircera   pantoprazole 40 MG tablet Commonly known as: PROTONIX Take 40 mg by mouth daily.   senna-docusate 8.6-50 MG tablet Commonly known as: Senokot-S Take 1 tablet by mouth at bedtime as needed for up to 10 days for mild constipation or moderate constipation.        Disposition and follow-up:   [name] was discharged from [facility] in Stable condition.  At the hospital follow up visit please address:  ESRD on HD MWF.  Normocytic anemia of ESRD, asymptomatic.  Hemoglobin 7.5 Secondary hyperparathyroidism.  Serum calcium level at goal, please check phos Bilateral hip avascular necrosis.  He requires surgery.  Consulted orthopedic  surgery during hospitalization, however they said that immediate surgery was not indicated, and that he needs to continue with his outpatient appointment. Cirrhosis.  Has resulting thrombocytopenia, platelets at 133, AST/ALT 21/8. However no ascites or any other symptoms. Adrenal insufficiency on hydrocortisone 5 mg daily.  Patient reported that he has been taking steroids for years, however reported that he has not been taking it within the last week because it was in storage (reported he is moving to  [facility] November).  Per prior Endocrinology note, there was interest in repeating an ACTH stimulation test, however patient never arrived to that follow-up appointment.  Please consider ACTH stimulation test. Hypertension on Norvasc 10 mg.  Home med was held initially during hospitalization, for suspected stroke.  Last BP 163/94 Hyperlipidemia on Lipitor 40 mg.  LDL 93, triglycerides 117, HDL 32.   Remote history of HFrEF on Coreg 6.25 mg daily (45-50% 10/12/20).  Patient asymptomatic. Remote history of CVA (with decreased left vision 03/2015) Labs / imaging needed at time of follow-up: CBC, CMP, ACTH stimulation test Pending labs/ test needing follow-up: None  Follow-up Appointments:  Follow-up Information     Sonia Side., FNP Follow up in 1 week(s).   Specialty: Family Medicine Why: Within 1 week, to talk about your heart, blood pressure, liver, and adrenal glands. Contact information: [place] [address] [address], [facility]. Go in 1 day(s).   Why: Monday, Wednesday, Friday Contact information: [place] [address] [phone]         Janalee Dane, [place] .   Specialty: Nephrology Contact information: [place] [address] [phone]         Avanell Shackleton III, MD. Go in 1 week(s).   Why: Please go to your orthopedic surgeon for your hip pain. Contact information: [address] [place] [address] [phone]         Renato Shin, MD. Go in 1 week(s).   Specialty: Endocrinology Why: Please make an appointment and visit your endocrinologist for your adrenal glands. Contact information: [address] [place] [address] [phone]                   Hospital Course by problem list: Marvin Roberson is a 50 y.o. male, with pertinent PMHx of ESRD on HD MWF, bilateral avascular necrosis, adrenal insufficiency, HFrEF on coreg  25mg  BID, cirrhosis, hypertension on Norvasc, hyperlipidemia on Lipitor, past CVA, presented 07/26/2021 after being found unresponsive in bed, with his eyes open and soaked in his urine.   On 9/17, patient wanted to the ED for hip pain, and was discharged with Robaxin, 10 pills of Vicodin, and Lidoderm.  In the ED, blood glucose was 27 at arrival. He received D10, resulting blood glucose 178, and became alert and oriented x4.  During hospitalization, neurology and nephrology were consulted. Patient refused head MRI. Head CT and EEG were negative for stroke and ongoing seizure activity.  PT/OT worked with patient.  Called orthopedic surgery, and they stated that no immediate surgery is indicated.  That it is best for patient to keep his appointment with his outpatient surgeon.  #Acute Encephalopathy Neurology was consulted. On encounter, patient was alert and oriented x4, however was unable to provide history, and had scanning speech.  From presentation, concern for possible stroke/TIA vs. provoked seizure vs. adrenal insufficiency vs. accidental overdose.  Of note, he  reported that he has been off his home medicines for about a week, except for the pain medicine from the ED, prior to admission. Patient mental status returned to baseline the next day after HD, with no events overnight. Currently considering provoked seizure due to hypoglycemia vs. oversedation from Vicodin due to decreased hepatic metabolism. Infectious etiology is unlikely because he has remained afebrile, with no leukocytosis and blood culture shows no growth after 12 hours. Was concerned for accidental overdose, however, salicylate and acetaminophen level were <7. ketones, and protein. However he denies any urinary symptoms.  No evidence of metabolic etiology found in labs. TSH within normal limits. Blood glucose of 157. UA was positive for glucose, hemoglobin. EEG is suggestive of mild to moderate diffuse encephalopathy, no seizures or  definite epileptiform discharges were seen throughout the recording. Patient refused head MRI, we will obtain head CT.   #ESRD on HD MWF #Normocytic anemia Nephrology was consulted. Patient was tolerating HD well. Able to produce urine. Hemoglobin 7.5, likely 2/2 to ESRD. Iron levels wnl. Ferritin 5,355, likely due to inflammation of hips.  -HD per nephrology   #Bilateral hip avascular necrosis Discussed with orthopedic, they stated that immediate surgery was not indicated, and that patient is better served to be seen at his outpatient surgeon. Patient reported bilateral hip pain, that is consistent with finding on hip x-ray. Likely secondary to chronic steroid use for adrenal insufficiency. Pain has become nearly unbearable, where patient is unable to ambulate.  -PT/OT -Dilaudid 1 mg p.o. as needed   #Cirrhosis Reported history of cirrhosis with resulting thrombocytopenia. Platelets 133, INR 1.2. HepB surface antibody is reactive and HepB antigen is non-reactive, no active infection. Indirect bilirubin 1.2, direct bilirubin 0.2. HIV antibody nonreactive. Right upper quadrant ultrasound was consistent with chronic liver cirrhosis, no lesions. Gallbladder and biliary system normal.  No further workup necessary. Will avoid hepatotoxic drugs.  Discharge Exam:   BP (!) 163/94 (BP Location: Left Leg)   Pulse 66   Temp 97.7 F (36.5 C) (Oral)   Resp 12   Ht 5\' 8"  (1.727 m)   Wt 59 kg   SpO2 93%   BMI 19.78 kg/m   Physical Exam Vitals and nursing note reviewed.  Constitutional:      General: He is not in acute distress.    Appearance: Normal appearance. He is not diaphoretic.  HENT:     Head: Normocephalic and atraumatic.  Eyes:     Extraocular Movements: Extraocular movements intact.  Cardiovascular:     Rate and Rhythm: Normal rate and regular rhythm.     Pulses: Normal pulses.     Heart sounds: Normal heart sounds.  Pulmonary:     Effort: Pulmonary effort is normal.     Breath  sounds: Normal breath sounds.  Abdominal:     General: Abdomen is flat. Bowel sounds are normal.     Palpations: Abdomen is soft.  Skin:    General: Skin is warm and dry.  Neurological:     Mental Status: He is alert and oriented to person, place, and time.  Psychiatric:     Comments: Dysphoric due to hip pain, however is pleasant and cooperative     Pertinent Labs, Studies, and Procedures:   CT HEAD WO CONTRAST (5MM)  Result Date: 07/26/2021 CLINICAL DATA:  History of stroke.  Follow-up. EXAM: CT HEAD WITHOUT CONTRAST TECHNIQUE: Contiguous axial images were obtained from the base of the skull through the vertex without intravenous contrast. COMPARISON:  None  FINDINGS: Brain: No evidence of acute infarction, hemorrhage, hydrocephalus, extra-axial collection or mass lesion/mass effect. There is encephalomalacia within the left frontal lobe with surrounding volume loss compatible with remote infarct. Prominence of the sulci and ventricles compatible with age advanced brain atrophy. The cerebral and cerebellar hemispheres are otherwise unremarkable. Vascular: No hyperdense vessel or unexpected calcification. Skull: Normal. Negative for fracture or focal lesion. Sinuses/Orbits: No acute finding. Other: None IMPRESSION: 1. No acute intracranial abnormalities. 2. Chronic left frontal lobe infarct. 3. Age advanced brain atrophy. Electronically Signed   By: Kerby Moors M.D.   On: 07/26/2021 11:27   DG Pelvis Portable  Result Date: 07/24/2021 CLINICAL DATA:  Avascular necrosis, BILATERAL hip pain EXAM: PORTABLE PELVIS 1-2 VIEWS COMPARISON:  Portable exam 1301 hours compared to CT abdomen and pelvis 01/27/2020 FINDINGS: Patient rotated to the RIGHT. Osseous mineralization normal. Avascular necrosis changes seen at the femoral heads bilaterally. Mild height loss/irregularity at the cranial margin of the LEFT femoral head is seen. Minimal flattening at the cranial margin of the RIGHT femoral head is a  chronic defect as well. No new fracture, dislocation or bone destruction. IMPRESSION: Avascular necrosis changes of the femoral heads bilaterally, more severe on LEFT. Electronically Signed   By: Lavonia Dana M.D.   On: 07/24/2021 13:32   DG Chest Portable 1 View  Result Date: 07/24/2021 CLINICAL DATA:  BILATERAL hip pains, unresponsive, hypoglycemia, history avascular necrosis EXAM: PORTABLE CHEST 1 VIEW COMPARISON:  Portable exam 1258 hours compared to 04/15/2021 FINDINGS: Dual lumen RIGHT jugular central venous catheter with tip projecting over cavoatrial junction. Normal heart size, mediastinal contours, and pulmonary vascularity. Lungs clear. No pulmonary infiltrate, pleural effusion, or pneumothorax. Surgical clips LEFT infraclavicular with vascular stent at the LEFT axilla. Bones demineralized. IMPRESSION: No acute abnormalities. Electronically Signed   By: Lavonia Dana M.D.   On: 07/24/2021 13:31   EEG adult  Result Date: 07/25/2021 Lora Havens, MD     07/25/2021  8:54 AM Patient Name: Marvin Roberson MRN: [id number] Epilepsy Attending: Lora Havens Referring Physician/Provider: Anibal Henderson, NP Date: 07/24/2021 Duration: 26.34 mins Patient history: 50 y.o. male, with pertinent PMHx of ESRD on HD MWF, bilateral avascular necrosis, adrenal insufficiency, HFrEF on coreg 25mg  BID, cirrhosis, hypertension on Norvasc, hyperlipidemia on Lipitor, past CVA, presented today after being found unresponsive in bed, with his eyes open and soaked in his urine. EEG to evaluate for seizure. Level of alertness: Awake, AEDs during EEG study: None Technical aspects: This EEG study was done with scalp electrodes positioned according to the 10-20 International system of electrode placement. Electrical activity was acquired at a sampling rate of 500Hz  and reviewed with a high frequency filter of 70Hz  and a low frequency filter of 1Hz . EEG data were recorded continuously and digitally stored. Description: No posterior  dominant rhythm was seen. EEG showed continuous generalized polymorphic predominantly 6  to 8 Hz theta-alpha activity admixed with intermittent 2-3Hz  delta slowing, at times with triphasic morphology Physiologic photic driving was not seen during photic stimulation. Hyperventilation was not performed.   ABNORMALITY - Continuous slow, generalized IMPRESSION: This study is suggestive of mild to moderate diffuse encephalopathy. No seizures or definite epileptiform discharges were seen throughout the recording. Lora Havens   DG Hip Unilat W or Wo Pelvis 2-3 Views Right  Result Date: 07/21/2021 CLINICAL DATA:  Right hip pain.  No injury. EXAM: DG HIP (WITH OR WITHOUT PELVIS) 2-3V RIGHT COMPARISON:  Abdomen 12/06/2020 FINDINGS: Mild valgus orientation to the right  hip. There is evidence of bilateral avascular necrosis with lucency and sclerosis in the superior femoral heads, greater on the left. No evidence of acute fracture or dislocation. IMPRESSION: Changes likely indicating bilateral avascular necrosis, greater on the left. Electronically Signed   By: Lucienne Capers M.D.   On: 07/21/2021 20:25   US Abdomen Limited RUQ (LIVER/GB)  Result Date: 07/25/2021 CLINICAL DATA:  Elevated ALP level. EXAM: ULTRASOUND ABDOMEN LIMITED RIGHT UPPER QUADRANT COMPARISON:  CT 01/27/2020 FINDINGS: Gallbladder: Physiologically distended. No gallstones or wall thickening visualized. No sonographic Murphy sign noted by sonographer. Common bile duct: Diameter: 5 mm, normal. Liver: Heterogeneous hepatic echogenicity. There is mild capsular nodularity. No discrete focal lesion. Portal vein is patent on color Doppler imaging with normal direction of blood flow towards the liver. Other: No right upper quadrant ascites. Incidental right renal cyst, many of the additional cysts on CT not well seen. Increased right renal parenchymal echogenicity. IMPRESSION: 1. Cirrhotic hepatic morphology without evidence of focal lesion. 2. Normal  sonographic appearance of the gallbladder and biliary tree. Electronically Signed   By: Keith Rake M.D.   On: 07/25/2021 18:53     Discharge Instructions:   Discharge Instructions      Dear Marvin Roberson, I am so glad you are feeling better and can be discharged today! You were admitted because of a possible seizure due to low blood sugaror over sedation because of the pain medicines you are on.   Imagings/tests of your head and brain shows that there is no stroke.  Please see the following instructions: For your: Kidneys Continue going to hemodialysis, please do not miss any sessions. Continue following up with your kidney doctor, to make sure that your kidneys will not get worse. Please avoid pain medicines labeled as NSAIDs, such as Advil, ibuprofen, Aleve.  Please check the back of the medicines packaging to ensure if there are NSAIDs in the product.  However NSAIDs used on top of the skin such as Voltaren gel is safe because they do not go to the kidneys. Be careful and use lower amounts, when you are using muscle relaxers such as Flexeril and Robaxin, as they can make you too sleepy. Liver Please continue to avoid drinking any forms of alcohol and any illicit drugs. Please follow up with a doctor, to get liver lab work done 1-2 times a year to make sure that they do not worsen. Please be careful or avoid opiate pain medicines such as Norco, Vicodin, and others as because your liver is not 100%, and may cause buildup in your system.  Making you too sleepy. However, you will receive Dilaudid for pain, which is the least likely to harm her liver. Adrenal glands (makes steroids) Please continue taking your hydrocortisone 5 mg every day until you get retested, and the specialist doctors as otherwise. DO NOT STOP taking hydrocortisone 5 mg without the supervision of a doctor. Please follow up with your doctor, to get this retested to see if you still need to be on daily hydrocortisone.  Because it is most likely that the hydrocortisone that you have been taking for years is the cause of your hip pain. Blood pressure and heart Please continue hydralazine 100 mg, Norvasc 10 mg, Coreg 6.25 mg, and Lipitor 40 mg every day (or night depending on her preference). Please follow-up with your doctor at least once a year to make sure that there is no issues so that you can decrease your risk of heart attack and stroke.  Hip pain Please follow up with your orthopedic surgeon for hip surgery. You have been provided with Dilaudid, that you can take as needed for pain until you are able to see your surgeon.  It was a pleasure meeting you, Marvin Roberson.  I wish you and your family the best, hope you stay happy and healthy, and have safe travels!     Signed: Merrily Brittle, DO 07/26/2021, 4:16 PM   Pager: [phone]

## 2021-07-26 NOTE — Progress Notes (Addendum)
[facility] Progress Note   Subjective:   Patient seen and examined at bedside.  Admits to fatigue, not sleeping well here.  Mentation improved closer to baseline today.  Denies CP, SOB, n/v/d, and abdominal pain.  Admits to occasional palpitations.  States hip pain better this AM, able to walk around the room with PT earlier.   Objective Vitals:   07/25/21 2337 07/26/21 0343 07/26/21 0813 07/26/21 1207  BP: (!) 162/88 (!) 160/90 (!) 164/93 (!) 163/94  Pulse: 68 69 67 66  Resp: 17 13 20 12   Temp: 98.4 F (36.9 C) 97.6 F (36.4 C) 97.9 F (36.6 C) 97.7 F (36.5 C)  TempSrc: Oral Oral Oral Oral  SpO2: 96% 97% 100% 93%  Weight:      Height:       Physical Exam General:well appearing male in NAD, AAOx3, speech clear Heart:RRR, no mrg Lungs:CTAB, nml WOB on RA Abdomen:soft, NTND Extremities:no LE edema Dialysis Access: [facility]   [facility] Weights   07/24/21 2233 07/25/21 1218  Weight: 59 kg 59 kg    Intake/Output Summary (Last 24 hours) at 07/26/2021 1219 Last data filed at 07/25/2021 1300 Gross per 24 hour  Intake 240 ml  Output --  Net 240 ml    Additional Objective Labs: Basic Metabolic Panel: Recent Labs  Lab 07/24/21 1225 07/25/21 0542 07/26/21 0158  NA 129* 129* 132*  K 4.8 5.9* 4.5  CL 90* 90* 95*  CO2 22 23 26   GLUCOSE 187* 183* 138*  BUN 64* 76* 43*  CREATININE 15.44* 16.27* 9.22*  CALCIUM 7.9* 8.1* 8.1*   Liver Function Tests: Recent Labs  Lab 07/24/21 1225 07/25/21 0542 07/26/21 0158  AST 34 22 21  ALT 10 8 8   ALKPHOS 203* 211* 171*  BILITOT 1.4* 1.2 0.7  PROT 8.0 8.6* 8.1  ALBUMIN 2.5* 2.5* 2.5*   CBC: Recent Labs  Lab 07/21/21 2047 07/24/21 1225 07/25/21 0542 07/26/21 0158  WBC 5.1 7.0 3.7* 4.7  NEUTROABS  --  5.0  --  4.1  HGB 7.9* 8.5* 8.3* 7.5*  HCT 25.2* 26.3* 25.7* 22.9*  MCV 95.5 93.9 93.8 93.1  PLT PLATELET CLUMPS NOTED ON SMEAR, UNABLE TO ESTIMATE 104* 113* 133*   Blood Culture    Component Value Date/Time    SDES BLOOD RIGHT FOREARM 07/24/2021 2311   SPECREQUEST  07/24/2021 2311    BOTTLES DRAWN AEROBIC AND ANAEROBIC Blood Culture adequate volume   CULT  07/24/2021 2311    NO GROWTH 1 DAY Performed at [facility], [place] [address] [place], [place] [address]    REPTSTATUS PENDING 07/24/2021 2311    CBG: Recent Labs  Lab 07/24/21 1254 07/24/21 1329 07/24/21 1539 07/25/21 0631 07/25/21 1135  GLUCAP 86 93 156* 167* 163*   Iron Studies:  Recent Labs    07/25/21 0542  IRON 79  TIBC NOT CALCULATED  FERRITIN 5,356*   Lab Results  Component Value Date   INR 1.2 07/25/2021   INR 1.1 (H) 03/09/2020   INR 1.22 11/27/2016   Studies/Results: CT HEAD WO CONTRAST (5MM)  Result Date: 07/26/2021 CLINICAL DATA:  History of stroke.  Follow-up. EXAM: CT HEAD WITHOUT CONTRAST TECHNIQUE: Contiguous axial images were obtained from the base of the skull through the vertex without intravenous contrast. COMPARISON:  None FINDINGS: Brain: No evidence of acute infarction, hemorrhage, hydrocephalus, extra-axial collection or mass lesion/mass effect. There is encephalomalacia within the left frontal lobe with surrounding volume loss compatible with remote infarct. Prominence of  the sulci and ventricles compatible with age advanced brain atrophy. The cerebral and cerebellar hemispheres are otherwise unremarkable. Vascular: No hyperdense vessel or unexpected calcification. Skull: Normal. Negative for fracture or focal lesion. Sinuses/Orbits: No acute finding. Other: None IMPRESSION: 1. No acute intracranial abnormalities. 2. Chronic left frontal lobe infarct. 3. Age advanced brain atrophy. Electronically Signed   By: Kerby Moors M.D.   On: 07/26/2021 11:27   DG Pelvis Portable  Result Date: 07/24/2021 CLINICAL DATA:  Avascular necrosis, BILATERAL hip pain EXAM: PORTABLE PELVIS 1-2 VIEWS COMPARISON:  Portable exam 1301 hours compared to CT abdomen and pelvis 01/27/2020 FINDINGS: Patient rotated to the  RIGHT. Osseous mineralization normal. Avascular necrosis changes seen at the femoral heads bilaterally. Mild height loss/irregularity at the cranial margin of the LEFT femoral head is seen. Minimal flattening at the cranial margin of the RIGHT femoral head is a chronic defect as well. No new fracture, dislocation or bone destruction. IMPRESSION: Avascular necrosis changes of the femoral heads bilaterally, more severe on LEFT. Electronically Signed   By: Lavonia Dana M.D.   On: 07/24/2021 13:32   DG Chest Portable 1 View  Result Date: 07/24/2021 CLINICAL DATA:  BILATERAL hip pains, unresponsive, hypoglycemia, history avascular necrosis EXAM: PORTABLE CHEST 1 VIEW COMPARISON:  Portable exam 1258 hours compared to 04/15/2021 FINDINGS: Dual lumen RIGHT jugular central venous catheter with tip projecting over cavoatrial junction. Normal heart size, mediastinal contours, and pulmonary vascularity. Lungs clear. No pulmonary infiltrate, pleural effusion, or pneumothorax. Surgical clips LEFT infraclavicular with vascular stent at the LEFT axilla. Bones demineralized. IMPRESSION: No acute abnormalities. Electronically Signed   By: Lavonia Dana M.D.   On: 07/24/2021 13:31   EEG adult  Result Date: 07/25/2021 Lora Havens, MD     07/25/2021  8:54 AM Patient Name: Marvin Roberson MRN: [id number] Epilepsy Attending: Lora Havens Referring Physician/Provider: Anibal Henderson, NP Date: 07/24/2021 Duration: 26.34 mins Patient history: 50 y.o. male, with pertinent PMHx of ESRD on HD MWF, bilateral avascular necrosis, adrenal insufficiency, HFrEF on coreg 25mg  BID, cirrhosis, hypertension on Norvasc, hyperlipidemia on Lipitor, past CVA, presented today after being found unresponsive in bed, with his eyes open and soaked in his urine. EEG to evaluate for seizure. Level of alertness: Awake, AEDs during EEG study: None Technical aspects: This EEG study was done with scalp electrodes positioned according to the 10-20 International  system of electrode placement. Electrical activity was acquired at a sampling rate of 500Hz  and reviewed with a high frequency filter of 70Hz  and a low frequency filter of 1Hz . EEG data were recorded continuously and digitally stored. Description: No posterior dominant rhythm was seen. EEG showed continuous generalized polymorphic predominantly 6  to 8 Hz theta-alpha activity admixed with intermittent 2-3Hz  delta slowing, at times with triphasic morphology Physiologic photic driving was not seen during photic stimulation. Hyperventilation was not performed.   ABNORMALITY - Continuous slow, generalized IMPRESSION: This study is suggestive of mild to moderate diffuse encephalopathy. No seizures or definite epileptiform discharges were seen throughout the recording. Priyanka Barbra Sarks   US Abdomen Limited RUQ (LIVER/GB)  Result Date: 07/25/2021 CLINICAL DATA:  Elevated ALP level. EXAM: ULTRASOUND ABDOMEN LIMITED RIGHT UPPER QUADRANT COMPARISON:  CT 01/27/2020 FINDINGS: Gallbladder: Physiologically distended. No gallstones or wall thickening visualized. No sonographic Murphy sign noted by sonographer. Common bile duct: Diameter: 5 mm, normal. Liver: Heterogeneous hepatic echogenicity. There is mild capsular nodularity. No discrete focal lesion. Portal vein is patent on color Doppler imaging with normal direction of blood  flow towards the liver. Other: No right upper quadrant ascites. Incidental right renal cyst, many of the additional cysts on CT not well seen. Increased right renal parenchymal echogenicity. IMPRESSION: 1. Cirrhotic hepatic morphology without evidence of focal lesion. 2. Normal sonographic appearance of the gallbladder and biliary tree. Electronically Signed   By: Keith Rake M.D.   On: 07/25/2021 18:53    Medications:  sodium chloride      amLODipine  10 mg Oral QHS   atorvastatin  40 mg Oral Daily   Chlorhexidine Gluconate Cloth  6 each Topical Q0600   darbepoetin (ARANESP) injection -  DIALYSIS  200 mcg Intravenous Q Wed-HD   folic acid  1 mg Oral Daily   heparin  5,000 Units Subcutaneous Q8H   hydrALAZINE  100 mg Oral Q8H   hydrocortisone  5 mg Oral Daily   multivitamin with minerals  1 tablet Oral Daily   pantoprazole  40 mg Oral Daily   sodium chloride flush  3 mL Intravenous Q12H   thiamine  100 mg Intramuscular Once   [START ON 07/27/2021] thiamine  100 mg Oral Daily    Dialysis Orders: MWF [facility]  3.5hrs, BFR 350, DFR 500,  EDW 58kg, 2K/ 2Ca   Access: TDC  Heparin 2000 + 2000 intermittent Mircera 225 mcg q2wks - last 242mcg on 6/27 Hectorol 78mcg IV qHD       Assessment/Plan:  Acute Encephalopathy - Improving. Etiology unclear. Hx stroke, CT with no acute abnormalities, +old infarct and age advanced atrophy.  Hx adrenal insufficiency.  EEG with no seizures.  per PMD.   ESRD -  on HD MWF. Missed and shortened treatments recently d/t pain.  HD completed yesterday.  Plan for next HD tomorrow.   Hypertension/volume  - Missed several days of home meds.  on amlodipine 10mg  qd, carvedilol 25mg  BID and hydralazine 100mg  TID - does not look like he has picked up carvedilol in several months. Started amlodipine and hydralazine yesterday, will add carvedilol 6.25mg  BID today as BP remains elevated.  Does not appear volume overloaded.  Likely has had weight loss due to decreased appetite.  UF as tolerated.   Anemia of CKD - Hgb drop 7.5. No iron d/t high ferritin.  Aranesp 240mcg given with HD yesterday.   Secondary Hyperparathyroidism -  Ca in goal. Will check phos.  Continue binders and VDRA.  Nutrition - Renal diet w/Fluid restrictions.  B/l hip avascular necrosis - needs ortho appt Cirrhosis Adrenal insufficiency  Jen Mow, PA-C [place] 07/26/2021,12:19 PM  LOS: 1 day

## 2021-07-26 NOTE — Evaluation (Signed)
Occupational Therapy Evaluation Patient Details Name: Marvin Roberson MRN: [id number] DOB: Oct 11, 1971 Today's Date: 07/26/2021   History of Present Illness patient is a 50 year old male with a past medical history of ESRD on hemodialysis, bilateral avascular necrosis of the femoral heads, adrenal insufficiency, heart failure with mildly reduced ejection fraction of 45 to 50%, hypertension, hyperlipidemia, CVA, questionable history of cirrhosis who presented to the ED after being found unresponsive in his bed. He was found to be severely hypoglycemic with an altered mental status.   Clinical Impression   Marvin Roberson is a 50 year old man who presents with decreased ROM and strength as well as pain in bilateral lower extremities secondary to avascular necrosis. On evaluation patient demonstrates functional strength of upper extremities as well as ability to perform bed transfers and ambulate in room with min guard. Overall for ADLs patient is mostly min guard or set up for ADLs except for LB dressing and bathing due to pain with attempting to reach lower legs and feet. Patient will benefit from skilled OT services while in hospital to improve deficits and learn compensatory strategies as needed in order to return to PLOF. Patient would benefit from AE education and therapist also recommend shower chair for home.         Recommendations for follow up therapy are one component of a multi-disciplinary discharge planning process, led by the attending physician.  Recommendations may be updated based on patient status, additional functional criteria and insurance authorization.   Follow Up Recommendations  No OT follow up    Equipment Recommendations  Tub/shower seat;Other (comment) (AE)    Recommendations for Other Services       Precautions / Restrictions Precautions Precautions: Fall Restrictions Weight Bearing Restrictions: No      Mobility Bed Mobility Overal bed mobility: Modified  Independent             General bed mobility comments: increased time but no physical assistance    Transfers Overall transfer level: Needs assistance Equipment used: Straight cane Transfers: Sit to/from Stand Sit to Stand: Min guard         General transfer comment: min guard to ambulate in room slowly with cane. Refuses to try walker    Balance Overall balance assessment: Mild deficits observed, not formally tested                                         ADL either performed or assessed with clinical judgement   ADL Overall ADL's : Needs assistance/impaired Eating/Feeding: Independent   Grooming: Set up;Sitting   Upper Body Bathing: Set up;Sitting   Lower Body Bathing: Minimal assistance;Sit to/from stand   Upper Body Dressing : Set up;Sitting   Lower Body Dressing: Minimal assistance;Sit to/from stand Lower Body Dressing Details (indicate cue type and reason): diffiuclty reaching feet and using figure four method due to hip pain. Toilet Transfer: Electronics engineer and Hygiene: Min guard;Sit to/from stand   Tub/ Banker: Min guard   Functional mobility during ADLs: Min guard       Vision Patient Visual Report: No change from baseline       Perception     Praxis      Pertinent Vitals/Pain Pain Assessment: Faces Faces Pain Scale: Hurts little more Pain Location: hips Pain Descriptors / Indicators: Aching;Sore;Grimacing Pain Intervention(s): Limited activity within patient's tolerance  Hand Dominance     Extremity/Trunk Assessment Upper Extremity Assessment Upper Extremity Assessment: Overall WFL for tasks assessed   Lower Extremity Assessment Lower Extremity Assessment: Defer to PT evaluation   Cervical / Trunk Assessment Cervical / Trunk Assessment: Normal   Communication Communication Communication: No difficulties   Cognition Arousal/Alertness:  Awake/alert Behavior During Therapy: WFL for tasks assessed/performed Overall Cognitive Status: Within Functional Limits for tasks assessed                                     General Comments       Exercises     Shoulder Instructions      Home Living Family/patient expects to be discharged to:: Private residence Living Arrangements: Other relatives (cousin) Available Help at Discharge: Family;Friend(s);Available PRN/intermittently Type of Home: House Home Access: Level entry           Bathroom Shower/Tub: Teacher, early years/pre: Standard     Home Equipment: Cane - single point          Prior Functioning/Environment Level of Independence: Independent with assistive device(s)                 OT Problem List: Decreased range of motion;Decreased strength;Decreased activity tolerance;Impaired balance (sitting and/or standing);Pain;Decreased knowledge of use of DME or AE      OT Treatment/Interventions: Self-care/ADL training;DME and/or AE instruction;Therapeutic activities;Balance training;Patient/family education    OT Goals(Current goals can be found in the care plan section) Acute Rehab OT Goals Patient Stated Goal: to be independen OT Goal Formulation: With patient Time For Goal Achievement: 08/09/21 Potential to Achieve Goals: Good  OT Frequency: Min 2X/week   Barriers to D/C:            Co-evaluation              AM-PAC OT "6 Clicks" Daily Activity     Outcome Measure Help from another person eating meals?: None Help from another person taking care of personal grooming?: A Little Help from another person toileting, which includes using toliet, bedpan, or urinal?: A Little Help from another person bathing (including washing, rinsing, drying)?: A Little Help from another person to put on and taking off regular upper body clothing?: A Little Help from another person to put on and taking off regular lower body  clothing?: A Little 6 Click Score: 19   End of Session Equipment Utilized During Treatment: Other (comment) (personal cane) Nurse Communication: Other (comment) (okay to see)  Activity Tolerance: Patient limited by pain Patient left: in bed;with call bell/phone within reach  OT Visit Diagnosis: Pain                Time: 1002-1017 OT Time Calculation (min): 15 min Charges:  OT General Charges $OT Visit: 1 Visit OT Evaluation $OT Eval Low Complexity: 1 Low  Analiyah Lechuga, OTR/L [place]  Office [phone] Pager: [place] 07/26/2021, 10:28 AM

## 2021-07-26 NOTE — Discharge Instructions (Addendum)
Dear Marvin Roberson, I am so glad you are feeling better and can be discharged today! You were admitted because of a possible seizure due to low blood sugaror over sedation because of the pain medicines you are on.   Imagings/tests of your head and brain shows that there is no stroke.  Please see the following instructions: For your: Kidneys Continue going to hemodialysis, please do not miss any sessions. Continue following up with your kidney doctor, to make sure that your kidneys will not get worse. Please avoid pain medicines labeled as NSAIDs, such as Advil, ibuprofen, Aleve.  Please check the back of the medicines packaging to ensure if there are NSAIDs in the product.  However NSAIDs used on top of the skin such as Voltaren gel is safe because they do not go to the kidneys. Be careful and use lower amounts, when you are using muscle relaxers such as Flexeril and Robaxin, as they can make you too sleepy. Liver Please continue to avoid drinking any forms of alcohol and any illicit drugs. Please follow up with a doctor, to get liver lab work done 1-2 times a year to make sure that they do not worsen. Please be careful or avoid opiate pain medicines such as Norco, Vicodin, and others as because your liver is not 100%, and may cause buildup in your system.  Making you too sleepy. However, you will receive Dilaudid for pain, which is the least likely to harm her liver. Adrenal glands (makes steroids) Please continue taking your hydrocortisone 5 mg every day until you get retested, and the specialist doctors as otherwise. DO NOT STOP taking hydrocortisone 5 mg without the supervision of a doctor. Please follow up with your doctor, to get this retested to see if you still need to be on daily hydrocortisone. Because it is most likely that the hydrocortisone that you have been taking for years is the cause of your hip pain. Blood pressure and heart Please continue hydralazine 100 mg, Norvasc 10 mg,  Coreg 6.25 mg, and Lipitor 40 mg every day (or night depending on her preference). Please follow-up with your doctor at least once a year to make sure that there is no issues so that you can decrease your risk of heart attack and stroke. Hip pain Please follow up with your orthopedic surgeon for hip surgery. You have been provided with Dilaudid, that you can take as needed for pain until you are able to see your surgeon.  It was a pleasure meeting you, Marvin Roberson.  I wish you and your family the best, hope you stay happy and healthy, and have safe travels!

## 2021-07-26 NOTE — Discharge Summary (Addendum)
Name: Marvin Roberson MRN: [id number] DOB: Feb 06, 1971 51 y.o. PCP: Marvin Roberson., FNP  Date of Admission: 07/24/2021 11:09 AM Date of Discharge: 07/26/2021  3:45 PM Attending Physician: No att. providers found  Discharge Diagnosis: ESRD on HD MWF Normocytic anemia of ESRD Bilateral hip avascular necrosis  Cirrhosis  Secondary hyperparathyroidism Adrenal insufficiency on hydrocortisone 5 mg daily Hypertension on Norvasc 10 mg Hyperlipidemia on Lipitor 40 mg Remote history of HFrEF on Coreg 6.25 mg daily (45-50% 10/12/20) Remote history of CVA (with decreased left vision 03/2015)  Discharge Medications: Allergies as of 07/26/2021       Reactions   Doxycycline Shortness Of Breath   Codeine Nausea And Vomiting   Gabapentin Other (See Comments)   Sleepiness, altered mental state   Lisinopril Other (See Comments), Swelling   angioedema   Povidone-iodine Itching   Betadine [povidone Iodine] Rash        Medication List     STOP taking these medications    HYDROcodone-acetaminophen 5-325 MG tablet Commonly known as: NORCO/VICODIN       TAKE these medications    amLODipine 10 MG tablet Commonly known as: NORVASC Take 10 mg by mouth at bedtime.   atorvastatin 40 MG tablet Commonly known as: LIPITOR Take 40 mg by mouth at bedtime.   calcium acetate 667 MG capsule Commonly known as: PHOSLO Take 1,334 mg by mouth daily after breakfast.   carvedilol 6.25 MG tablet Commonly known as: COREG Take 1 tablet (6.25 mg total) by mouth 2 (two) times daily with a meal. What changed:  medication strength how much to take   cyclobenzaprine 5 MG tablet Commonly known as: FLEXERIL Take 1 tablet (5 mg total) by mouth 3 (three) times daily as needed for muscle spasms.   DIALYVITE PO Take 1 tablet by mouth daily.   hydrALAZINE 100 MG tablet Commonly known as: APRESOLINE Take 1 tablet (100 mg total) by mouth every 8 (eight) hours. What changed: when to take this    hydrocortisone 5 MG tablet Commonly known as: CORTEF Take 5 mg by mouth daily.   HYDROmorphone 2 MG tablet Commonly known as: DILAUDID Take 0.5 tablets (1 mg total) by mouth every 6 (six) hours as needed for up to 3 days for severe pain or moderate pain.   isosorbide dinitrate 10 MG tablet Commonly known as: ISORDIL Take 1 tablet (10 mg total) by mouth 3 (three) times daily. What changed: when to take this   lidocaine 5 % Commonly known as: Lidoderm Place 1 patch onto the skin daily as needed. Remove & Discard patch within 12 hours or as directed by MD What changed: reasons to take this   methocarbamol 500 MG tablet Commonly known as: ROBAXIN Take 2 tablets (1,000 mg total) by mouth 2 (two) times daily.   MIRCERA IJ Mircera   pantoprazole 40 MG tablet Commonly known as: PROTONIX Take 40 mg by mouth daily.   senna-docusate 8.6-50 MG tablet Commonly known as: Senokot-S Take 1 tablet by mouth at bedtime as needed for up to 10 days for mild constipation or moderate constipation.        Disposition and follow-up:   Mr.Marvin Roberson was discharged from [facility] in Stable condition.  At the hospital follow up visit please address:  ESRD on HD MWF.  Normocytic anemia of ESRD, asymptomatic.  Hemoglobin 7.5 Secondary hyperparathyroidism.  Serum calcium level at goal, please check phos Bilateral hip avascular necrosis.  He requires surgery.  Consulted orthopedic  surgery during hospitalization, however they said that immediate surgery was not indicated, and that he needs to continue with his outpatient appointment. Cirrhosis.  Has resulting thrombocytopenia, platelets at 133, AST/ALT 21/8. However no ascites or any other symptoms. Adrenal insufficiency on hydrocortisone 5 mg daily.  Patient reported that he has been taking steroids for years, however reported that he has not been taking it within the last week because it was in storage (reported he is moving to  [facility] November).  Per prior Endocrinology note, there was interest in repeating an ACTH stimulation test, however patient never arrived to that follow-up appointment.  Please consider ACTH stimulation test. Hypertension on Norvasc 10 mg.  Home med was held initially during hospitalization, for suspected stroke.  Last BP 163/94 Hyperlipidemia on Lipitor 40 mg.  LDL 93, triglycerides 117, HDL 32.   Remote history of HFrEF on Coreg 6.25 mg daily (45-50% 10/12/20).  Patient asymptomatic. Remote history of CVA (with decreased left vision 03/2015) Labs / imaging needed at time of follow-up: CBC, CMP, ACTH stimulation test Pending labs/ test needing follow-up: None  Follow-up Appointments:  Follow-up Information     Marvin Roberson., FNP Follow up in 1 week(s).   Specialty: Family Medicine Why: Within 1 week, to talk about your heart, blood pressure, liver, and adrenal glands. Contact information: [place] [address] [place], [facility]. Go in 1 day(s).   Why: Monday, Wednesday, Friday Contact information: [place] [address] [phone]         Marvin Roberson, [place] .   Specialty: Nephrology Contact information: [place] [address] [phone]         Marvin Shackleton III, MD. Go in 1 week(s).   Why: Please go to your orthopedic surgeon for your hip pain. Contact information: [address] [place] [address] [phone]         Marvin Shin, MD. Go in 1 week(s).   Specialty: Endocrinology Why: Please make an appointment and visit your endocrinologist for your adrenal glands. Contact information: [address] [place] [address] [phone]                   Hospital Course by problem list: Marvin Roberson is a 50 y.o. male, with pertinent PMHx of ESRD on HD MWF, bilateral avascular necrosis, adrenal insufficiency, HFrEF on coreg  25mg  BID, cirrhosis, hypertension on Norvasc, hyperlipidemia on Lipitor, past CVA, presented 07/26/2021 after being found unresponsive in bed, with his eyes open and soaked in his urine.   On 9/17, patient wanted to the ED for hip pain, and was discharged with Robaxin, 10 pills of Vicodin, and Lidoderm.  In the ED, blood glucose was 27 at arrival. He received D10, resulting blood glucose 178, and became alert and oriented x4.  During hospitalization, neurology and nephrology were consulted. Patient refused head MRI. Head CT and EEG were negative for stroke and ongoing seizure activity.  PT/OT worked with patient.  Called orthopedic surgery, and they stated that no immediate surgery is indicated.  That it is best for patient to keep his appointment with his outpatient surgeon.  #Acute Encephalopathy Neurology was consulted. On encounter, patient was alert and oriented x4, however was unable to provide history, and had scanning speech.  From presentation, concern for possible stroke/TIA vs. provoked seizure vs. adrenal insufficiency vs. accidental overdose.  Of note, he  reported that he has been off his home medicines for about a week, except for the pain medicine from the ED, prior to admission. Patient mental status returned to baseline the next day after HD, with no events overnight. Currently considering provoked seizure due to hypoglycemia vs. oversedation from Vicodin due to decreased hepatic metabolism. Infectious etiology is unlikely because he has remained afebrile, with no leukocytosis and blood culture shows no growth after 12 hours. Was concerned for accidental overdose, however, salicylate and acetaminophen level were <7. ketones, and protein. However he denies any urinary symptoms.  No evidence of metabolic etiology found in labs. TSH within normal limits. Blood glucose of 157. UA was positive for glucose, hemoglobin. EEG is suggestive of mild to moderate diffuse encephalopathy, no seizures or  definite epileptiform discharges were seen throughout the recording. Patient refused head MRI,. Repeat head CT with no acute CVA   #ESRD on HD MWF #Normocytic anemia Nephrology was consulted. Patient was tolerating HD well. Able to produce urine. Hemoglobin 7.5, likely 2/2 to ESRD. Iron levels wnl. Ferritin 5,355, likely due to inflammation of hips.  -HD per nephrology   #Bilateral hip avascular necrosis Discussed with orthopedic, they stated that immediate surgery was not indicated, and that patient is better served to be seen at his outpatient surgeon. Patient reported bilateral hip pain, that is consistent with finding on hip x-ray. Likely secondary to chronic steroid use for adrenal insufficiency. Pain has become nearly unbearable, where patient is unable to ambulate.  -PT/OT -Dilaudid 1 mg p.o. as needed   #Cirrhosis Reported history of cirrhosis with resulting thrombocytopenia. Platelets 133, INR 1.2. HepB surface antibody is reactive and HepB antigen is non-reactive, no active infection. Indirect bilirubin 1.2, direct bilirubin 0.2. HIV antibody nonreactive. Right upper quadrant ultrasound was consistent with chronic liver cirrhosis, no lesions. Gallbladder and biliary system normal.  No further workup necessary. Will avoid hepatotoxic drugs.  Discharge Exam:   BP (!) 163/94 (BP Location: Left Leg)   Pulse 66   Temp 97.7 F (36.5 C) (Oral)   Resp 12   Ht 5\' 8"  (1.727 m)   Wt 59 kg   SpO2 93%   BMI 19.78 kg/m   Physical Exam Vitals and nursing note reviewed.  Constitutional:      General: He is not in acute distress.    Appearance: Normal appearance. He is not diaphoretic.  HENT:     Head: Normocephalic and atraumatic.  Eyes:     Extraocular Movements: Extraocular movements intact.  Cardiovascular:     Rate and Rhythm: Normal rate and regular rhythm.     Pulses: Normal pulses.     Heart sounds: Normal heart sounds.  Pulmonary:     Effort: Pulmonary effort is normal.      Breath sounds: Normal breath sounds.  Abdominal:     General: Abdomen is flat. Bowel sounds are normal.     Palpations: Abdomen is soft.  Skin:    General: Skin is warm and dry.  Neurological:     Mental Status: He is alert and oriented to person, place, and time.  Psychiatric:     Comments: Dysphoric due to hip pain, however is pleasant and cooperative     Pertinent Labs, Studies, and Procedures:   CT HEAD WO CONTRAST (5MM)  Result Date: 07/26/2021 CLINICAL DATA:  History of stroke.  Follow-up. EXAM: CT HEAD WITHOUT CONTRAST TECHNIQUE: Contiguous axial images were obtained from the base of the skull through the vertex without intravenous contrast. COMPARISON:  None FINDINGS: Brain: No evidence of acute infarction, hemorrhage, hydrocephalus, extra-axial collection or mass lesion/mass effect. There is encephalomalacia within the left frontal lobe with surrounding volume loss compatible with remote infarct. Prominence of the sulci and ventricles compatible with age advanced brain atrophy. The cerebral and cerebellar hemispheres are otherwise unremarkable. Vascular: No hyperdense vessel or unexpected calcification. Skull: Normal. Negative for fracture or focal lesion. Sinuses/Orbits: No acute finding. Other: None IMPRESSION: 1. No acute intracranial abnormalities. 2. Chronic left frontal lobe infarct. 3. Age advanced brain atrophy. Electronically Signed   By: Kerby Moors M.D.   On: 07/26/2021 11:27   DG Pelvis Portable  Result Date: 07/24/2021 CLINICAL DATA:  Avascular necrosis, BILATERAL hip pain EXAM: PORTABLE PELVIS 1-2 VIEWS COMPARISON:  Portable exam 1301 hours compared to CT abdomen and pelvis 01/27/2020 FINDINGS: Patient rotated to the RIGHT. Osseous mineralization normal. Avascular necrosis changes seen at the femoral heads bilaterally. Mild height loss/irregularity at the cranial margin of the LEFT femoral head is seen. Minimal flattening at the cranial margin of the RIGHT femoral head  is a chronic defect as well. No new fracture, dislocation or bone destruction. IMPRESSION: Avascular necrosis changes of the femoral heads bilaterally, more severe on LEFT. Electronically Signed   By: Lavonia Dana M.D.   On: 07/24/2021 13:32   DG Chest Portable 1 View  Result Date: 07/24/2021 CLINICAL DATA:  BILATERAL hip pains, unresponsive, hypoglycemia, history avascular necrosis EXAM: PORTABLE CHEST 1 VIEW COMPARISON:  Portable exam 1258 hours compared to 04/15/2021 FINDINGS: Dual lumen RIGHT jugular central venous catheter with tip projecting over cavoatrial junction. Normal heart size, mediastinal contours, and pulmonary vascularity. Lungs clear. No pulmonary infiltrate, pleural effusion, or pneumothorax. Surgical clips LEFT infraclavicular with vascular stent at the LEFT axilla. Bones demineralized. IMPRESSION: No acute abnormalities. Electronically Signed   By: Lavonia Dana M.D.   On: 07/24/2021 13:31   EEG adult  Result Date: 07/25/2021 Lora Havens, MD     07/25/2021  8:54 AM Patient Name: Marvin Roberson MRN: [id number] Epilepsy Attending: Lora Havens Referring Physician/Provider: Anibal Henderson, NP Date: 07/24/2021 Duration: 26.34 mins Patient history: 50 y.o. male, with pertinent PMHx of ESRD on HD MWF, bilateral avascular necrosis, adrenal insufficiency, HFrEF on coreg 25mg  BID, cirrhosis, hypertension on Norvasc, hyperlipidemia on Lipitor, past CVA, presented today after being found unresponsive in bed, with his eyes open and soaked in his urine. EEG to evaluate for seizure. Level of alertness: Awake, AEDs during EEG study: None Technical aspects: This EEG study was done with scalp electrodes positioned according to the 10-20 International system of electrode placement. Electrical activity was acquired at a sampling rate of 500Hz  and reviewed with a high frequency filter of 70Hz  and a low frequency filter of 1Hz . EEG data were recorded continuously and digitally stored. Description: No  posterior dominant rhythm was seen. EEG showed continuous generalized polymorphic predominantly 6  to 8 Hz theta-alpha activity admixed with intermittent 2-3Hz  delta slowing, at times with triphasic morphology Physiologic photic driving was not seen during photic stimulation. Hyperventilation was not performed.   ABNORMALITY - Continuous slow, generalized IMPRESSION: This study is suggestive of mild to moderate diffuse encephalopathy. No seizures or definite epileptiform discharges were seen throughout the recording. Lora Havens   DG Hip Unilat W or Wo Pelvis 2-3 Views Right  Result Date: 07/21/2021 CLINICAL DATA:  Right hip pain.  No injury. EXAM: DG HIP (WITH OR WITHOUT PELVIS) 2-3V RIGHT COMPARISON:  Abdomen 12/06/2020 FINDINGS: Mild valgus orientation to the  right hip. There is evidence of bilateral avascular necrosis with lucency and sclerosis in the superior femoral heads, greater on the left. No evidence of acute fracture or dislocation. IMPRESSION: Changes likely indicating bilateral avascular necrosis, greater on the left. Electronically Signed   By: Lucienne Capers M.D.   On: 07/21/2021 20:25   US Abdomen Limited RUQ (LIVER/GB)  Result Date: 07/25/2021 CLINICAL DATA:  Elevated ALP level. EXAM: ULTRASOUND ABDOMEN LIMITED RIGHT UPPER QUADRANT COMPARISON:  CT 01/27/2020 FINDINGS: Gallbladder: Physiologically distended. No gallstones or wall thickening visualized. No sonographic Murphy sign noted by sonographer. Common bile duct: Diameter: 5 mm, normal. Liver: Heterogeneous hepatic echogenicity. There is mild capsular nodularity. No discrete focal lesion. Portal vein is patent on color Doppler imaging with normal direction of blood flow towards the liver. Other: No right upper quadrant ascites. Incidental right renal cyst, many of the additional cysts on CT not well seen. Increased right renal parenchymal echogenicity. IMPRESSION: 1. Cirrhotic hepatic morphology without evidence of focal lesion.  2. Normal sonographic appearance of the gallbladder and biliary tree. Electronically Signed   By: Keith Rake M.D.   On: 07/25/2021 18:53     Discharge Instructions:   Discharge Instructions      Dear Mr. Hagmann, I am so glad you are feeling better and can be discharged today! You were admitted because of a possible seizure due to low blood sugaror over sedation because of the pain medicines you are on.   Imagings/tests of your head and brain shows that there is no stroke.  Please see the following instructions: For your: Kidneys Continue going to hemodialysis, please do not miss any sessions. Continue following up with your kidney doctor, to make sure that your kidneys will not get worse. Please avoid pain medicines labeled as NSAIDs, such as Advil, ibuprofen, Aleve.  Please check the back of the medicines packaging to ensure if there are NSAIDs in the product.  However NSAIDs used on top of the skin such as Voltaren gel is safe because they do not go to the kidneys. Be careful and use lower amounts, when you are using muscle relaxers such as Flexeril and Robaxin, as they can make you too sleepy. Liver Please continue to avoid drinking any forms of alcohol and any illicit drugs. Please follow up with a doctor, to get liver lab work done 1-2 times a year to make sure that they do not worsen. Please be careful or avoid opiate pain medicines such as Norco, Vicodin, and others as because your liver is not 100%, and may cause buildup in your system.  Making you too sleepy. However, you will receive Dilaudid for pain, which is the least likely to harm her liver. Adrenal glands (makes steroids) Please continue taking your hydrocortisone 5 mg every day until you get retested, and the specialist doctors as otherwise. DO NOT STOP taking hydrocortisone 5 mg without the supervision of a doctor. Please follow up with your doctor, to get this retested to see if you still need to be on daily  hydrocortisone. Because it is most likely that the hydrocortisone that you have been taking for years is the cause of your hip pain. Blood pressure and heart Please continue hydralazine 100 mg, Norvasc 10 mg, Coreg 6.25 mg, and Lipitor 40 mg every day (or night depending on her preference). Please follow-up with your doctor at least once a year to make sure that there is no issues so that you can decrease your risk of heart attack and  stroke. Hip pain Please follow up with your orthopedic surgeon for hip surgery. You have been provided with Dilaudid, that you can take as needed for pain until you are able to see your surgeon.  It was a pleasure meeting you, Mr. Harvill.  I wish you and your family the best, hope you stay happy and healthy, and have safe travels!     Signed: Merrily Brittle, DO 07/26/2021, 4:16 PM   Pager: [phone]

## 2021-07-26 NOTE — Progress Notes (Signed)
Discussed DC instructions with patient. He verbalized understanding. Pt given print out prescription for dilaudid. All belongings sent with patient. Pt left unit in wheelchair

## 2021-07-27 ENCOUNTER — Telehealth: Payer: Self-pay | Admitting: Nurse Practitioner

## 2021-07-27 NOTE — Telephone Encounter (Signed)
Transition of care contact from inpatient facility  Date of discharge: 07/26/21 Date of contact: 07/27/21 Method: Phone Spoke to: Patient  Patient contacted to discuss transition of care from recent inpatient hospitalization. Patient was admitted to [facility] from 09/20-09/22/22 with discharge diagnosis of Normocytic anemia of ESRD, Bilateral hip avascular necrosis   Medication changes were reviewed.  Patient will follow up with his/her outpatient HD unit on: 07/28/2021

## 2021-07-30 DIAGNOSIS — N186 End stage renal disease: Secondary | ICD-10-CM | POA: Diagnosis not present

## 2021-07-30 DIAGNOSIS — Z992 Dependence on renal dialysis: Secondary | ICD-10-CM | POA: Diagnosis not present

## 2021-07-30 DIAGNOSIS — G9340 Encephalopathy, unspecified: Secondary | ICD-10-CM | POA: Diagnosis not present

## 2021-07-30 LAB — CULTURE, BLOOD (ROUTINE X 2)
Culture: NO GROWTH
Special Requests: ADEQUATE

## 2021-08-01 ENCOUNTER — Telehealth: Payer: Self-pay

## 2021-08-01 NOTE — Telephone Encounter (Signed)
   Name: Marvin Roberson  DOB: [birth year]  MRN: [id number]  Primary Cardiologist: None  Chart reviewed as part of pre-operative protocol coverage. Given recent hospitalizations and overdue follow-up appointments,  Marvin Roberson will require a follow-up visit in order to better assess preoperative cardiovascular risk.  Pre-op covering staff: - Please schedule appointment and call patient to inform them. Please add "pre-op clearance" to the appointment notes so provider is aware. - Please contact requesting surgeon's office via preferred method (i.e, phone, fax) to inform them of need for appointment prior to surgery.   Abigail Butts, PA-C  08/01/2021, 12:18 PM

## 2021-08-01 NOTE — Telephone Encounter (Signed)
I s/w the pt and advised he needed an appt for pre op clearance. I have scheduled the pt with Richardson Dopp, [facility] 08/28/21 @ 2:15. Pt has been placed on wait list as well for possible sooner. I will send notes to PA for appt. Will send FYI to surgeon's office.

## 2021-08-01 NOTE — Telephone Encounter (Signed)
   [facility] Pre-operative Risk Assessment    Patient Name: Marvin Roberson  DOB: [birth year] MRN: [id number]  HEARTCARE STAFF:  - IMPORTANT!!!!!! Under Visit Info/Reason for Call, type in Other and utilize the format Clearance MM/DD/YY or Clearance TBD. Do not use dashes or single digits. - Please review there is not already an duplicate clearance open for this procedure. - If request is for dental extraction, please clarify the # of teeth to be extracted. - If the patient is currently at the dentist's office, call Pre-Op Callback Staff (MA/nurse) to input urgent request.  - If the patient is not currently in the dentist office, please route to the Pre-Op pool.  Request for surgical clearance:  What type of surgery is being performed? LT TOTAL HIP REPLACEMENT  When is this surgery scheduled? TBD  What type of clearance is required (medical clearance vs. Pharmacy clearance to hold med vs. Both)? MEDICAL  Are there any medications that need to be held prior to surgery and how long? NONE  Practice name and name of physician performing surgery? [facility] [place]; DR. Charlies Constable   What is the office phone number? [phone]   7.   What is the office fax number? [phone]  8.   Anesthesia type (None, local, MAC, general) ? NONE LISTED   Jacinta Shoe 08/01/2021, 9:26 AM  _________________________________________________________________   (provider comments below)

## 2021-08-04 DIAGNOSIS — N186 End stage renal disease: Secondary | ICD-10-CM | POA: Diagnosis not present

## 2021-08-04 DIAGNOSIS — I129 Hypertensive chronic kidney disease with stage 1 through stage 4 chronic kidney disease, or unspecified chronic kidney disease: Secondary | ICD-10-CM | POA: Diagnosis not present

## 2021-08-04 DIAGNOSIS — Z992 Dependence on renal dialysis: Secondary | ICD-10-CM | POA: Diagnosis not present

## 2021-08-06 DIAGNOSIS — D509 Iron deficiency anemia, unspecified: Secondary | ICD-10-CM | POA: Diagnosis not present

## 2021-08-06 DIAGNOSIS — N2581 Secondary hyperparathyroidism of renal origin: Secondary | ICD-10-CM | POA: Diagnosis not present

## 2021-08-06 DIAGNOSIS — D631 Anemia in chronic kidney disease: Secondary | ICD-10-CM | POA: Diagnosis not present

## 2021-08-06 DIAGNOSIS — E875 Hyperkalemia: Secondary | ICD-10-CM | POA: Diagnosis not present

## 2021-08-06 DIAGNOSIS — N186 End stage renal disease: Secondary | ICD-10-CM | POA: Diagnosis not present

## 2021-08-06 DIAGNOSIS — Z992 Dependence on renal dialysis: Secondary | ICD-10-CM | POA: Diagnosis not present

## 2021-08-07 ENCOUNTER — Other Ambulatory Visit: Payer: Medicare Other

## 2021-08-07 ENCOUNTER — Telehealth: Payer: Self-pay

## 2021-08-07 DIAGNOSIS — E785 Hyperlipidemia, unspecified: Secondary | ICD-10-CM | POA: Diagnosis not present

## 2021-08-07 DIAGNOSIS — K7460 Unspecified cirrhosis of liver: Secondary | ICD-10-CM | POA: Diagnosis not present

## 2021-08-07 DIAGNOSIS — R2681 Unsteadiness on feet: Secondary | ICD-10-CM | POA: Diagnosis not present

## 2021-08-07 DIAGNOSIS — N186 End stage renal disease: Secondary | ICD-10-CM | POA: Diagnosis not present

## 2021-08-07 DIAGNOSIS — M87059 Idiopathic aseptic necrosis of unspecified femur: Secondary | ICD-10-CM | POA: Diagnosis not present

## 2021-08-07 DIAGNOSIS — Z79899 Other long term (current) drug therapy: Secondary | ICD-10-CM | POA: Diagnosis not present

## 2021-08-07 DIAGNOSIS — E43 Unspecified severe protein-calorie malnutrition: Secondary | ICD-10-CM | POA: Diagnosis not present

## 2021-08-07 DIAGNOSIS — Z0000 Encounter for general adult medical examination without abnormal findings: Secondary | ICD-10-CM | POA: Diagnosis not present

## 2021-08-07 DIAGNOSIS — Z7189 Other specified counseling: Secondary | ICD-10-CM | POA: Diagnosis not present

## 2021-08-07 DIAGNOSIS — D631 Anemia in chronic kidney disease: Secondary | ICD-10-CM | POA: Diagnosis not present

## 2021-08-07 DIAGNOSIS — I132 Hypertensive heart and chronic kidney disease with heart failure and with stage 5 chronic kidney disease, or end stage renal disease: Secondary | ICD-10-CM | POA: Diagnosis not present

## 2021-08-07 DIAGNOSIS — E2740 Unspecified adrenocortical insufficiency: Secondary | ICD-10-CM | POA: Diagnosis not present

## 2021-08-07 NOTE — Telephone Encounter (Signed)
Please contact pt to schedule a F/U appt before Loanne Drilling fills out papers from [facility] for surgery clearance

## 2021-08-08 NOTE — Telephone Encounter (Signed)
Patient is scheduled for appointment on 08/14/21

## 2021-08-14 ENCOUNTER — Other Ambulatory Visit: Payer: Self-pay

## 2021-08-14 ENCOUNTER — Ambulatory Visit (INDEPENDENT_AMBULATORY_CARE_PROVIDER_SITE_OTHER): Payer: Medicare Other | Admitting: Endocrinology

## 2021-08-14 VITALS — BP 140/84 | HR 77 | Ht 68.0 in | Wt 128.8 lb

## 2021-08-14 DIAGNOSIS — E2740 Unspecified adrenocortical insufficiency: Secondary | ICD-10-CM | POA: Diagnosis not present

## 2021-08-14 NOTE — Progress Notes (Signed)
Subjective:    Patient ID: Marvin Roberson, male    DOB: 12/31/1970, 50 y.o.   MRN: [id number]  HPI Pt returns for f/u of Addison's Disease (dx'ed approx 2010, when he lived in [place]; he has been on cortef since dx, but he takes just 5 mg qd; he receives HD for ESRD (due to HTN).  Pt did not show up for ACTH stim test.  He had CVA 1 month ago.  He says the next time he can do ACTH stim test is 08/21/21. Past Medical History:  Diagnosis Date   Adrenal insufficiency (HCC)    Allergy    Anemia    Anxiety    Depression    Dyspnea    "when I have too much fluid"   ESRD on dialysis [facility]) since 1990s   "M,W,F; [address]" (11/27/2016)   GERD (gastroesophageal reflux disease)    Heart failure with reduced ejection fraction (HCC)    High cholesterol    History of blood transfusion    Hypertension    Kidney failure    Renal insufficiency    Stroke ([place]) 2016   decreased vision in his left eye/notes 11/27/2016   Tricuspid regurgitation     Past Surgical History:  Procedure Laterality Date   ANGIOPLASTY Left 12/11/2016   Procedure: ANGIOPLASTY LEFT ARM & SUBCLAVIAN ARTERY;  Surgeon: Conrad New Falcon, MD;  Location: [place];  Service: Vascular;  Laterality: Left;   arm surgery Left 2016   "for aneurysm"   AV FISTULA PLACEMENT     FALSE ANEURYSM REPAIR Left 12/11/2016   Procedure: RESECTION  BRACHIAL ARTERY;  Surgeon: Conrad Troy Grove, MD;  Location: [place];  Service: Vascular;  Laterality: Left;   FALSE ANEURYSM REPAIR Left 01/14/2018   Procedure: REPAIR FALSE ANEURYSM LEFT BRACHIAL ARTERY WITH SAPHENOUS VEIN  LEFT ARTERIOVENOUS GRAFT;  Surgeon: Rosetta Posner, MD;  Location: [facility];  Service: Vascular;  Laterality: Left;   INGUINAL HERNIA REPAIR Left 11/23/2020   Procedure: LAPAROSCOPIC LEFT INGUINAL HERNIA REPAIR WITH MESH;  Surgeon: Ralene Ok, MD;  Location: [place] [facility];  Service: General;  Laterality: Left;   INSERTION OF DIALYSIS CATHETER Right    chest   IR FLUORO GUIDE CV LINE RIGHT  11/17/2019    THROMBECTOMY BRACHIAL ARTERY  12/11/2016   Procedure: THROMBECTOMY BRACHIAL ARTERY AND ULNAR;  Surgeon: Conrad Tolstoy, MD;  Location: [facility] OR;  Service: Vascular;;   THYROIDECTOMY, PARTIAL     UPPER EXTREMITY ANGIOGRAM Left 12/11/2016   Procedure: LEFT ARM ANGIOGRAM;  Surgeon: Conrad Inman, MD;  Location: [facility];  Service: Vascular;  Laterality: Left;   VEIN HARVEST Left 01/14/2018   Procedure: VEIN HARVEST LEFT SAPHENOUS VEIN;  Surgeon: Rosetta Posner, MD;  Location: [facility] OR;  Service: Vascular;  Laterality: Left;   WOUND EXPLORATION Left 12/11/2016   Procedure: LEFT ARM BRACHIAL ARTERY WITH INTERPOSTIONAL GRAFT;  Surgeon: Conrad Honeyville, MD;  Location[facility];  Service: Vascular;  Laterality: Left;    Social History   Socioeconomic History   Marital status: Single    Spouse name: Not on file   Number of children: Not on file   Years of education: Not on file   Highest education level: Not on file  Occupational History   Not on file  Tobacco Use   Smoking status: Every Day    Packs/day: 0.25    Years: 20.00    Pack years: 5.00    Types: Cigarettes   Smokeless tobacco:  Never   Tobacco comments:    1 pk every 4 days.  Vaping Use   Vaping Use: Never used  Substance and Sexual Activity   Alcohol use: Not Currently   Drug use: No   Sexual activity: Yes  Other Topics Concern   Not on file  Social History Narrative   Not on file   Social Determinants of Health   Financial Resource Strain: Not on file  Food Insecurity: Not on file  Transportation Needs: Not on file  Physical Activity: Not on file  Stress: Not on file  Social Connections: Not on file  Intimate Partner Violence: Not on file    Current Outpatient Medications on File Prior to Visit  Medication Sig Dispense Refill   amLODipine (NORVASC) 10 MG tablet Take 10 mg by mouth at bedtime.      atorvastatin (LIPITOR) 40 MG tablet Take 40 mg by mouth at bedtime.      B Complex-C-Folic Acid (DIALYVITE PO) Take 1 tablet by mouth  daily.     calcium acetate (PHOSLO) 667 MG capsule Take 1,334 mg by mouth daily after breakfast.     carvedilol (COREG) 6.25 MG tablet Take 1 tablet (6.25 mg total) by mouth 2 (two) times daily with a meal. 60 tablet 0   cyclobenzaprine (FLEXERIL) 5 MG tablet Take 1 tablet (5 mg total) by mouth 3 (three) times daily as needed for muscle spasms. 15 tablet 0   hydrALAZINE (APRESOLINE) 100 MG tablet Take 1 tablet (100 mg total) by mouth every 8 (eight) hours. 90 tablet 0   hydrocortisone (CORTEF) 5 MG tablet Take 5 mg by mouth daily.      isosorbide dinitrate (ISORDIL) 10 MG tablet Take 1 tablet (10 mg total) by mouth 3 (three) times daily. (Patient taking differently: Take 10 mg by mouth 2 (two) times daily.) 90 tablet 6   lidocaine (LIDODERM) 5 % Place 1 patch onto the skin daily as needed. Remove & Discard patch within 12 hours or as directed by MD (Patient taking differently: Place 1 patch onto the skin daily as needed (pain). Remove & Discard patch within 12 hours or as directed by MD) 30 patch 0   methocarbamol (ROBAXIN) 500 MG tablet Take 2 tablets (1,000 mg total) by mouth 2 (two) times daily. 20 tablet 0   Methoxy PEG-Epoetin Beta (MIRCERA IJ) Mircera     pantoprazole (PROTONIX) 40 MG tablet Take 40 mg by mouth daily.     No current facility-administered medications on file prior to visit.    Allergies  Allergen Reactions   Doxycycline Shortness Of Breath   Codeine Nausea And Vomiting   Gabapentin Other (See Comments)    Sleepiness, altered mental state   Lisinopril Other (See Comments) and Swelling    angioedema   Povidone-Iodine Itching   Betadine [Povidone Iodine] Rash    Family History  Problem Relation Age of Onset   Hypertension Other    Cancer Brother        in leg and lungs   Heart attack Mother    Adrenal disorder Neg Hx    Colon cancer Neg Hx    Rectal cancer Neg Hx    Stomach cancer Neg Hx     BP 140/84 (BP Location: Right Arm, Patient Position: Sitting, Cuff  Size: Normal)   Pulse 77   Ht 5\' 8"  (1.727 m)   Wt 128 lb 12.8 oz (58.4 kg)   SpO2 97%   BMI 19.58 kg/m  Review of Systems     Objective:   Physical Exam VITAL SIGNS:  See vs page GENERAL: no distress EXT: no leg edema.  GAIT: steady with a cane.        Assessment & Plan:  Addison's Dz, by hx.  On a very low dosage of HC, so dx is uncertain HTN: uncontrolled.  In this setting, we should d/c HC if possible.  Patient Instructions  Please continue the same hydrocortisone.   Next Tuesday, 08/21/21, do not take it that morning.  please come to the lab, to do "before and after" blood tests (also called "ACTH stimulation" test).   Then take the pill when you get home.  We'll let you know about the results.

## 2021-08-14 NOTE — Patient Instructions (Addendum)
Please continue the same hydrocortisone.   Next Tuesday, 08/21/21, do not take it that morning.  please come to the lab, to do "before and after" blood tests (also called "ACTH stimulation" test).   Then take the pill when you get home.  We'll let you know about the results.

## 2021-08-17 DIAGNOSIS — R300 Dysuria: Secondary | ICD-10-CM | POA: Diagnosis not present

## 2021-08-20 ENCOUNTER — Other Ambulatory Visit: Payer: Self-pay

## 2021-08-20 MED ORDER — ISOSORBIDE DINITRATE 10 MG PO TABS: 10.0000 mg | ORAL_TABLET | Freq: Three times a day (TID) | ORAL | 3 refills | Status: AC

## 2021-08-21 ENCOUNTER — Ambulatory Visit: Payer: Medicare Other

## 2021-08-21 ENCOUNTER — Other Ambulatory Visit: Payer: Medicare Other

## 2021-08-27 DIAGNOSIS — R070 Pain in throat: Secondary | ICD-10-CM | POA: Diagnosis not present

## 2021-08-28 ENCOUNTER — Other Ambulatory Visit: Payer: Medicare Other

## 2021-08-28 ENCOUNTER — Ambulatory Visit: Payer: Medicare Other | Admitting: Physician Assistant

## 2021-08-28 ENCOUNTER — Encounter: Payer: Self-pay | Admitting: Physician Assistant

## 2021-08-28 ENCOUNTER — Ambulatory Visit: Payer: Medicare Other

## 2021-08-28 NOTE — Progress Notes (Deleted)
Cardiology Office Note:    Date:  08/28/2021   ID:  Marvin Roberson, DOB Jun 11, 1971, MRN [id number]  PCP:  Sonia Side., FNP   [facility] Providers Cardiologist:  None { Click to update primary MD,subspecialty MD or APP then REFRESH:1}  *** Referring MD: Sonia Side., FNP   Chief Complaint:  No chief complaint on file. {Click here for Visit Info    :1}   Patient Profile:   Marvin Roberson is a 50 y.o. male with:  HFmrEF (heart failure with mildly reduced ejection fraction)  Echocardiogram 9/15: EF 15-20 Echocardiogram 10/15: EF 30-35 Echocardiogram 12/21: EF 45-50 Hypertension Hyperlipidemia Prior DVT/pulmonary embolism History of COVID-pneumonia ESRD Anemia of chronic disease  S/p CVA in 2016 Staph bacteremia in 2013 (negative TEE) Thoracic aortic aneurysm Tricuspid regurgitation Moderate by echo 12/21 NSVT Adrenal insufficiency/Addison's Disease Dx in [place] in 2010 - on chronic hydrocortisone  Depression GERD +Cigs  Avascular necrosis  Hepatic cirrhosis  History of Present Illness: [name] was last seen by Dr. Gasper Sells in 1/22.  A f/u Chest/Aorta CTA was ordered but has been canceled by the pt.  He had f/u with Dr. Gasper Sells in 3/22, but this was canceled.  He was admitted in 9/22 with acute encephalopathy.  He was evaluated by neurology.  He refused MRI.  Serial CT scans were negative for acute CVA.  It was felt that his presenting symptoms were likely related to provoked seizure from hypoglycemia versus oversedation.  He returns for surgical clearance.  He needs a left total hip replacement with Dr. Zachery Dakins.  ***  ASSESSMENT & PLAN:   No problem-specific Assessment & Plan notes found for this encounter.      {Are you ordering a CV Procedure (e.g. stress test, cath, DCCV, TEE, etc)?   Press F2        :[id number]}   Dispo:  No follow-ups on file.     Prior CV studies: Cardiac event monitor 11/2020 Sinus rhythm, average heart rate 98 3 runs of  NSVT (longest 9 beats) PACs, PVCs No atrial fibrillation or high-grade heart  Echocardiogram 10/12/2020 GLS -14.9 with apical sparing, speckled pattern-suggest possible need for amyloid work-up; EF 45-50, moderate LVH, normal RVSF, moderate LAE, posterior pericardial effusion, mild MR, moderate TR, mild AV sclerosis without stenosis, aortic root 42 mm {Select studies to display:[id number]}    Past Medical History:  Diagnosis Date   Adrenal insufficiency (HCC)    Allergy    Anemia    Anxiety    Depression    Dyspnea    "when I have too much fluid"   ESRD on dialysis ([place]) since 1990s   "M,W,F; [address]" (11/27/2016)   GERD (gastroesophageal reflux disease)    Heart failure with reduced ejection fraction ([place])    HFmrEF (heart failure with mildly reduced EF) 11/16/2020   HFmrEF (heart failure with mildly reduced ejection fraction)  Echocardiogram 9/15: EF 15-20 Echocardiogram 10/15: EF 30-35 Echocardiogram 12/21: EF 45-50   High cholesterol    History of blood transfusion    Hypertension    Kidney failure    Renal insufficiency    Stroke ([place]) 2016   decreased vision in his left eye/notes 11/27/2016   Tricuspid regurgitation    Current Medications: No outpatient medications have been marked as taking for the 08/28/21 encounter (Appointment) with Richardson Dopp T, PA-C.    Allergies:   Doxycycline, Codeine, Gabapentin, Lisinopril, Povidone-iodine, and Betadine [povidone iodine]   Social History   Tobacco Use  Smoking status: Every Day    Packs/day: 0.25    Years: 20.00    Pack years: 5.00    Types: Cigarettes   Smokeless tobacco: Never   Tobacco comments:    1 pk every 4 days.  Vaping Use   Vaping Use: Never used  Substance Use Topics   Alcohol use: Not Currently   Drug use: No    Family Hx: [name] family history includes Cancer in his brother; Heart attack in his mother; Hypertension in an other family member. There is no history of Adrenal disorder, Colon  cancer, Rectal cancer, or Stomach cancer.  ROS   EKGs/Labs/Other Test Reviewed:    EKG:  EKG is *** ordered today.  The ekg ordered today demonstrates ***  Recent Labs: 07/24/2021: Magnesium 2.1 07/25/2021: TSH 0.845 07/26/2021: ALT 8; BUN 43; Creatinine, Ser 9.22; Hemoglobin 7.5; Platelets 133; Potassium 4.5; Sodium 132   Recent Lipid Panel Lab Results  Component Value Date/Time   CHOL 148 07/25/2021 05:42 AM   TRIG 117 07/25/2021 05:42 AM   HDL 32 (L) 07/25/2021 05:42 AM   LDLCALC 93 07/25/2021 05:42 AM     Risk Assessment/Calculations:   {Does this patient have ATRIAL FIBRILLATION?[phone]}      Physical Exam:    VS:  There were no vitals taken for this visit.    Wt Readings from Last 3 Encounters:  08/14/21 128 lb 12.8 oz (58.4 kg)  07/25/21 130 lb 1.1 oz (59 kg)  04/15/21 130 lb (59 kg)    Physical Exam ***     Medication Adjustments/Labs and Tests Ordered: Current medicines are reviewed at length with the patient today.  Concerns regarding medicines are outlined above.  Tests Ordered: No orders of the defined types were placed in this encounter.  Medication Changes: No orders of the defined types were placed in this encounter.  Signed, Richardson Dopp, PA-C  08/28/2021 8:11 AM    [facility] [place], [place], [place]  [address] Phone: [phone]; Fax: [phone]

## 2021-08-28 NOTE — Telephone Encounter (Signed)
Pt was a no show today. Richardson Dopp, PA-C    08/28/2021 4:41 PM

## 2021-09-04 DIAGNOSIS — N186 End stage renal disease: Secondary | ICD-10-CM | POA: Diagnosis not present

## 2021-09-04 DIAGNOSIS — Z992 Dependence on renal dialysis: Secondary | ICD-10-CM | POA: Diagnosis not present

## 2021-09-05 DIAGNOSIS — N2581 Secondary hyperparathyroidism of renal origin: Secondary | ICD-10-CM | POA: Diagnosis not present

## 2021-09-05 DIAGNOSIS — N186 End stage renal disease: Secondary | ICD-10-CM | POA: Diagnosis not present

## 2021-09-05 DIAGNOSIS — D631 Anemia in chronic kidney disease: Secondary | ICD-10-CM | POA: Diagnosis not present

## 2021-09-05 DIAGNOSIS — Z992 Dependence on renal dialysis: Secondary | ICD-10-CM | POA: Diagnosis not present

## 2021-10-04 DIAGNOSIS — N186 End stage renal disease: Secondary | ICD-10-CM | POA: Diagnosis not present

## 2021-10-04 DIAGNOSIS — Z992 Dependence on renal dialysis: Secondary | ICD-10-CM | POA: Diagnosis not present

## 2021-10-05 DIAGNOSIS — N2581 Secondary hyperparathyroidism of renal origin: Secondary | ICD-10-CM | POA: Diagnosis not present

## 2021-10-05 DIAGNOSIS — N186 End stage renal disease: Secondary | ICD-10-CM | POA: Diagnosis not present

## 2021-10-05 DIAGNOSIS — D631 Anemia in chronic kidney disease: Secondary | ICD-10-CM | POA: Diagnosis not present

## 2021-10-05 DIAGNOSIS — Z992 Dependence on renal dialysis: Secondary | ICD-10-CM | POA: Diagnosis not present

## 2021-11-04 DIAGNOSIS — N186 End stage renal disease: Secondary | ICD-10-CM | POA: Diagnosis not present

## 2021-11-04 DIAGNOSIS — Z992 Dependence on renal dialysis: Secondary | ICD-10-CM | POA: Diagnosis not present

## 2021-11-05 DIAGNOSIS — Z992 Dependence on renal dialysis: Secondary | ICD-10-CM | POA: Diagnosis not present

## 2021-11-05 DIAGNOSIS — N186 End stage renal disease: Secondary | ICD-10-CM | POA: Diagnosis not present

## 2021-11-05 DIAGNOSIS — D631 Anemia in chronic kidney disease: Secondary | ICD-10-CM | POA: Diagnosis not present

## 2021-11-05 DIAGNOSIS — N2581 Secondary hyperparathyroidism of renal origin: Secondary | ICD-10-CM | POA: Diagnosis not present

## 2021-11-09 DIAGNOSIS — D631 Anemia in chronic kidney disease: Secondary | ICD-10-CM | POA: Diagnosis not present

## 2021-11-09 DIAGNOSIS — Z992 Dependence on renal dialysis: Secondary | ICD-10-CM | POA: Diagnosis not present

## 2021-11-09 DIAGNOSIS — N2581 Secondary hyperparathyroidism of renal origin: Secondary | ICD-10-CM | POA: Diagnosis not present

## 2021-11-09 DIAGNOSIS — N186 End stage renal disease: Secondary | ICD-10-CM | POA: Diagnosis not present

## 2021-11-12 DIAGNOSIS — N2581 Secondary hyperparathyroidism of renal origin: Secondary | ICD-10-CM | POA: Diagnosis not present

## 2021-11-12 DIAGNOSIS — D631 Anemia in chronic kidney disease: Secondary | ICD-10-CM | POA: Diagnosis not present

## 2021-11-12 DIAGNOSIS — Z992 Dependence on renal dialysis: Secondary | ICD-10-CM | POA: Diagnosis not present

## 2021-11-12 DIAGNOSIS — N186 End stage renal disease: Secondary | ICD-10-CM | POA: Diagnosis not present

## 2021-11-16 DIAGNOSIS — D631 Anemia in chronic kidney disease: Secondary | ICD-10-CM | POA: Diagnosis not present

## 2021-11-16 DIAGNOSIS — N186 End stage renal disease: Secondary | ICD-10-CM | POA: Diagnosis not present

## 2021-11-16 DIAGNOSIS — N2581 Secondary hyperparathyroidism of renal origin: Secondary | ICD-10-CM | POA: Diagnosis not present

## 2021-11-16 DIAGNOSIS — Z992 Dependence on renal dialysis: Secondary | ICD-10-CM | POA: Diagnosis not present

## 2021-11-17 DIAGNOSIS — R590 Localized enlarged lymph nodes: Secondary | ICD-10-CM | POA: Diagnosis not present

## 2021-11-17 DIAGNOSIS — E875 Hyperkalemia: Secondary | ICD-10-CM | POA: Diagnosis not present

## 2021-11-17 DIAGNOSIS — N135 Crossing vessel and stricture of ureter without hydronephrosis: Secondary | ICD-10-CM | POA: Diagnosis present

## 2021-11-17 DIAGNOSIS — D649 Anemia, unspecified: Secondary | ICD-10-CM | POA: Diagnosis not present

## 2021-11-17 DIAGNOSIS — N189 Chronic kidney disease, unspecified: Secondary | ICD-10-CM | POA: Diagnosis not present

## 2021-11-17 DIAGNOSIS — I868 Varicose veins of other specified sites: Secondary | ICD-10-CM | POA: Diagnosis not present

## 2021-11-17 DIAGNOSIS — E162 Hypoglycemia, unspecified: Secondary | ICD-10-CM | POA: Diagnosis present

## 2021-11-17 DIAGNOSIS — R9431 Abnormal electrocardiogram [ECG] [EKG]: Secondary | ICD-10-CM | POA: Diagnosis not present

## 2021-11-17 DIAGNOSIS — I953 Hypotension of hemodialysis: Secondary | ICD-10-CM | POA: Diagnosis present

## 2021-11-17 DIAGNOSIS — J9811 Atelectasis: Secondary | ICD-10-CM | POA: Diagnosis not present

## 2021-11-17 DIAGNOSIS — F419 Anxiety disorder, unspecified: Secondary | ICD-10-CM | POA: Diagnosis present

## 2021-11-17 DIAGNOSIS — N280 Ischemia and infarction of kidney: Secondary | ICD-10-CM | POA: Diagnosis present

## 2021-11-17 DIAGNOSIS — F17210 Nicotine dependence, cigarettes, uncomplicated: Secondary | ICD-10-CM | POA: Diagnosis present

## 2021-11-17 DIAGNOSIS — R42 Dizziness and giddiness: Secondary | ICD-10-CM | POA: Diagnosis not present

## 2021-11-17 DIAGNOSIS — D6959 Other secondary thrombocytopenia: Secondary | ICD-10-CM | POA: Diagnosis present

## 2021-11-17 DIAGNOSIS — R112 Nausea with vomiting, unspecified: Secondary | ICD-10-CM | POA: Diagnosis not present

## 2021-11-17 DIAGNOSIS — Z992 Dependence on renal dialysis: Secondary | ICD-10-CM | POA: Diagnosis not present

## 2021-11-17 DIAGNOSIS — N186 End stage renal disease: Secondary | ICD-10-CM | POA: Diagnosis present

## 2021-11-17 DIAGNOSIS — I10 Essential (primary) hypertension: Secondary | ICD-10-CM | POA: Diagnosis not present

## 2021-11-17 DIAGNOSIS — N2581 Secondary hyperparathyroidism of renal origin: Secondary | ICD-10-CM | POA: Diagnosis not present

## 2021-11-17 DIAGNOSIS — G43909 Migraine, unspecified, not intractable, without status migrainosus: Secondary | ICD-10-CM | POA: Diagnosis present

## 2021-11-17 DIAGNOSIS — R079 Chest pain, unspecified: Secondary | ICD-10-CM | POA: Diagnosis not present

## 2021-11-17 DIAGNOSIS — R710 Precipitous drop in hematocrit: Secondary | ICD-10-CM | POA: Diagnosis not present

## 2021-11-17 DIAGNOSIS — K7030 Alcoholic cirrhosis of liver without ascites: Secondary | ICD-10-CM | POA: Diagnosis not present

## 2021-11-17 DIAGNOSIS — I864 Gastric varices: Secondary | ICD-10-CM | POA: Diagnosis present

## 2021-11-17 DIAGNOSIS — I6789 Other cerebrovascular disease: Secondary | ICD-10-CM | POA: Diagnosis not present

## 2021-11-17 DIAGNOSIS — K7460 Unspecified cirrhosis of liver: Secondary | ICD-10-CM | POA: Diagnosis not present

## 2021-11-17 DIAGNOSIS — F32A Depression, unspecified: Secondary | ICD-10-CM | POA: Diagnosis present

## 2021-11-17 DIAGNOSIS — D689 Coagulation defect, unspecified: Secondary | ICD-10-CM | POA: Diagnosis present

## 2021-11-17 DIAGNOSIS — C8593 Non-Hodgkin lymphoma, unspecified, intra-abdominal lymph nodes: Secondary | ICD-10-CM | POA: Diagnosis present

## 2021-11-17 DIAGNOSIS — E271 Primary adrenocortical insufficiency: Secondary | ICD-10-CM | POA: Diagnosis present

## 2021-11-17 DIAGNOSIS — I132 Hypertensive heart and chronic kidney disease with heart failure and with stage 5 chronic kidney disease, or end stage renal disease: Secondary | ICD-10-CM | POA: Diagnosis present

## 2021-11-17 DIAGNOSIS — I5020 Unspecified systolic (congestive) heart failure: Secondary | ICD-10-CM | POA: Diagnosis present

## 2021-11-17 DIAGNOSIS — K920 Hematemesis: Secondary | ICD-10-CM | POA: Diagnosis present

## 2021-11-17 DIAGNOSIS — D472 Monoclonal gammopathy: Secondary | ICD-10-CM | POA: Diagnosis present

## 2021-11-17 DIAGNOSIS — T82598A Other mechanical complication of other cardiac and vascular devices and implants, initial encounter: Secondary | ICD-10-CM | POA: Diagnosis present

## 2021-11-17 DIAGNOSIS — D631 Anemia in chronic kidney disease: Secondary | ICD-10-CM | POA: Diagnosis present

## 2021-11-17 DIAGNOSIS — E7800 Pure hypercholesterolemia, unspecified: Secondary | ICD-10-CM | POA: Diagnosis present

## 2021-11-17 DIAGNOSIS — I517 Cardiomegaly: Secondary | ICD-10-CM | POA: Diagnosis not present

## 2021-11-17 DIAGNOSIS — I071 Rheumatic tricuspid insufficiency: Secondary | ICD-10-CM | POA: Diagnosis present

## 2021-11-18 DIAGNOSIS — G43909 Migraine, unspecified, not intractable, without status migrainosus: Secondary | ICD-10-CM | POA: Diagnosis present

## 2021-11-18 DIAGNOSIS — I953 Hypotension of hemodialysis: Secondary | ICD-10-CM | POA: Diagnosis present

## 2021-11-18 DIAGNOSIS — D472 Monoclonal gammopathy: Secondary | ICD-10-CM | POA: Diagnosis present

## 2021-11-18 DIAGNOSIS — I132 Hypertensive heart and chronic kidney disease with heart failure and with stage 5 chronic kidney disease, or end stage renal disease: Secondary | ICD-10-CM | POA: Diagnosis present

## 2021-11-18 DIAGNOSIS — E875 Hyperkalemia: Secondary | ICD-10-CM | POA: Diagnosis not present

## 2021-11-18 DIAGNOSIS — E7800 Pure hypercholesterolemia, unspecified: Secondary | ICD-10-CM | POA: Diagnosis present

## 2021-11-18 DIAGNOSIS — I6789 Other cerebrovascular disease: Secondary | ICD-10-CM | POA: Diagnosis not present

## 2021-11-18 DIAGNOSIS — R710 Precipitous drop in hematocrit: Secondary | ICD-10-CM | POA: Diagnosis not present

## 2021-11-18 DIAGNOSIS — R42 Dizziness and giddiness: Secondary | ICD-10-CM | POA: Diagnosis not present

## 2021-11-18 DIAGNOSIS — I5020 Unspecified systolic (congestive) heart failure: Secondary | ICD-10-CM | POA: Diagnosis present

## 2021-11-18 DIAGNOSIS — N189 Chronic kidney disease, unspecified: Secondary | ICD-10-CM | POA: Diagnosis not present

## 2021-11-18 DIAGNOSIS — F419 Anxiety disorder, unspecified: Secondary | ICD-10-CM | POA: Diagnosis present

## 2021-11-18 DIAGNOSIS — D631 Anemia in chronic kidney disease: Secondary | ICD-10-CM | POA: Diagnosis present

## 2021-11-18 DIAGNOSIS — D649 Anemia, unspecified: Secondary | ICD-10-CM | POA: Diagnosis not present

## 2021-11-18 DIAGNOSIS — K7030 Alcoholic cirrhosis of liver without ascites: Secondary | ICD-10-CM | POA: Diagnosis not present

## 2021-11-18 DIAGNOSIS — I517 Cardiomegaly: Secondary | ICD-10-CM | POA: Diagnosis not present

## 2021-11-18 DIAGNOSIS — T82598A Other mechanical complication of other cardiac and vascular devices and implants, initial encounter: Secondary | ICD-10-CM | POA: Diagnosis present

## 2021-11-18 DIAGNOSIS — K7460 Unspecified cirrhosis of liver: Secondary | ICD-10-CM | POA: Diagnosis not present

## 2021-11-18 DIAGNOSIS — D689 Coagulation defect, unspecified: Secondary | ICD-10-CM | POA: Diagnosis present

## 2021-11-18 DIAGNOSIS — N2581 Secondary hyperparathyroidism of renal origin: Secondary | ICD-10-CM | POA: Diagnosis present

## 2021-11-18 DIAGNOSIS — E162 Hypoglycemia, unspecified: Secondary | ICD-10-CM | POA: Diagnosis present

## 2021-11-18 DIAGNOSIS — F17210 Nicotine dependence, cigarettes, uncomplicated: Secondary | ICD-10-CM | POA: Diagnosis present

## 2021-11-18 DIAGNOSIS — R9431 Abnormal electrocardiogram [ECG] [EKG]: Secondary | ICD-10-CM | POA: Diagnosis not present

## 2021-11-18 DIAGNOSIS — J9811 Atelectasis: Secondary | ICD-10-CM | POA: Diagnosis not present

## 2021-11-18 DIAGNOSIS — C8593 Non-Hodgkin lymphoma, unspecified, intra-abdominal lymph nodes: Secondary | ICD-10-CM | POA: Diagnosis present

## 2021-11-18 DIAGNOSIS — E271 Primary adrenocortical insufficiency: Secondary | ICD-10-CM | POA: Diagnosis present

## 2021-11-18 DIAGNOSIS — K920 Hematemesis: Secondary | ICD-10-CM | POA: Diagnosis present

## 2021-11-18 DIAGNOSIS — R590 Localized enlarged lymph nodes: Secondary | ICD-10-CM | POA: Diagnosis not present

## 2021-11-18 DIAGNOSIS — N135 Crossing vessel and stricture of ureter without hydronephrosis: Secondary | ICD-10-CM | POA: Diagnosis present

## 2021-11-18 DIAGNOSIS — F32A Depression, unspecified: Secondary | ICD-10-CM | POA: Diagnosis present

## 2021-11-18 DIAGNOSIS — I10 Essential (primary) hypertension: Secondary | ICD-10-CM | POA: Diagnosis not present

## 2021-11-18 DIAGNOSIS — D6959 Other secondary thrombocytopenia: Secondary | ICD-10-CM | POA: Diagnosis present

## 2021-11-18 DIAGNOSIS — I864 Gastric varices: Secondary | ICD-10-CM | POA: Diagnosis present

## 2021-11-18 DIAGNOSIS — I071 Rheumatic tricuspid insufficiency: Secondary | ICD-10-CM | POA: Diagnosis present

## 2021-11-18 DIAGNOSIS — N280 Ischemia and infarction of kidney: Secondary | ICD-10-CM | POA: Diagnosis present

## 2021-11-18 DIAGNOSIS — I868 Varicose veins of other specified sites: Secondary | ICD-10-CM | POA: Diagnosis not present

## 2021-11-18 DIAGNOSIS — N186 End stage renal disease: Secondary | ICD-10-CM | POA: Diagnosis present

## 2021-11-18 DIAGNOSIS — Z992 Dependence on renal dialysis: Secondary | ICD-10-CM | POA: Diagnosis not present

## 2021-11-19 DIAGNOSIS — R590 Localized enlarged lymph nodes: Secondary | ICD-10-CM | POA: Diagnosis not present

## 2021-11-19 DIAGNOSIS — N135 Crossing vessel and stricture of ureter without hydronephrosis: Secondary | ICD-10-CM | POA: Diagnosis not present

## 2021-11-20 DIAGNOSIS — R590 Localized enlarged lymph nodes: Secondary | ICD-10-CM | POA: Diagnosis not present

## 2021-11-20 DIAGNOSIS — N135 Crossing vessel and stricture of ureter without hydronephrosis: Secondary | ICD-10-CM | POA: Diagnosis not present

## 2021-11-22 DIAGNOSIS — K7030 Alcoholic cirrhosis of liver without ascites: Secondary | ICD-10-CM | POA: Diagnosis not present

## 2021-11-22 DIAGNOSIS — N186 End stage renal disease: Secondary | ICD-10-CM | POA: Diagnosis not present

## 2021-11-22 DIAGNOSIS — I868 Varicose veins of other specified sites: Secondary | ICD-10-CM | POA: Diagnosis not present

## 2021-11-22 DIAGNOSIS — K920 Hematemesis: Secondary | ICD-10-CM | POA: Diagnosis not present

## 2021-11-24 DIAGNOSIS — N135 Crossing vessel and stricture of ureter without hydronephrosis: Secondary | ICD-10-CM | POA: Diagnosis not present

## 2021-11-24 DIAGNOSIS — N189 Chronic kidney disease, unspecified: Secondary | ICD-10-CM | POA: Diagnosis not present

## 2021-11-24 DIAGNOSIS — J9811 Atelectasis: Secondary | ICD-10-CM | POA: Diagnosis not present

## 2021-11-24 DIAGNOSIS — K920 Hematemesis: Secondary | ICD-10-CM | POA: Diagnosis not present

## 2021-11-25 DIAGNOSIS — N189 Chronic kidney disease, unspecified: Secondary | ICD-10-CM | POA: Diagnosis not present

## 2021-11-25 DIAGNOSIS — K920 Hematemesis: Secondary | ICD-10-CM | POA: Diagnosis not present

## 2021-11-25 DIAGNOSIS — N135 Crossing vessel and stricture of ureter without hydronephrosis: Secondary | ICD-10-CM | POA: Diagnosis not present

## 2021-11-25 DIAGNOSIS — J9811 Atelectasis: Secondary | ICD-10-CM | POA: Diagnosis not present

## 2021-11-26 DIAGNOSIS — N186 End stage renal disease: Secondary | ICD-10-CM | POA: Diagnosis not present

## 2021-11-26 DIAGNOSIS — Z992 Dependence on renal dialysis: Secondary | ICD-10-CM | POA: Diagnosis not present

## 2021-11-26 DIAGNOSIS — E875 Hyperkalemia: Secondary | ICD-10-CM | POA: Diagnosis not present

## 2021-11-26 DIAGNOSIS — R9431 Abnormal electrocardiogram [ECG] [EKG]: Secondary | ICD-10-CM | POA: Diagnosis not present

## 2021-11-26 DIAGNOSIS — N135 Crossing vessel and stricture of ureter without hydronephrosis: Secondary | ICD-10-CM | POA: Diagnosis not present

## 2021-11-26 DIAGNOSIS — J9811 Atelectasis: Secondary | ICD-10-CM | POA: Diagnosis not present

## 2021-11-26 DIAGNOSIS — K920 Hematemesis: Secondary | ICD-10-CM | POA: Diagnosis not present

## 2021-11-26 DIAGNOSIS — N189 Chronic kidney disease, unspecified: Secondary | ICD-10-CM | POA: Diagnosis not present

## 2021-11-26 DIAGNOSIS — D649 Anemia, unspecified: Secondary | ICD-10-CM | POA: Diagnosis not present

## 2021-11-26 DIAGNOSIS — I517 Cardiomegaly: Secondary | ICD-10-CM | POA: Diagnosis not present

## 2021-11-27 DIAGNOSIS — J9811 Atelectasis: Secondary | ICD-10-CM | POA: Diagnosis not present

## 2021-11-27 DIAGNOSIS — N189 Chronic kidney disease, unspecified: Secondary | ICD-10-CM | POA: Diagnosis not present

## 2021-11-27 DIAGNOSIS — I6789 Other cerebrovascular disease: Secondary | ICD-10-CM | POA: Diagnosis not present

## 2021-11-27 DIAGNOSIS — N135 Crossing vessel and stricture of ureter without hydronephrosis: Secondary | ICD-10-CM | POA: Diagnosis not present

## 2021-11-27 DIAGNOSIS — K920 Hematemesis: Secondary | ICD-10-CM | POA: Diagnosis not present

## 2021-11-28 DIAGNOSIS — Z992 Dependence on renal dialysis: Secondary | ICD-10-CM | POA: Diagnosis not present

## 2021-11-28 DIAGNOSIS — N186 End stage renal disease: Secondary | ICD-10-CM | POA: Diagnosis not present

## 2021-11-28 DIAGNOSIS — I868 Varicose veins of other specified sites: Secondary | ICD-10-CM | POA: Diagnosis not present

## 2021-11-28 DIAGNOSIS — I10 Essential (primary) hypertension: Secondary | ICD-10-CM | POA: Diagnosis not present

## 2021-11-28 DIAGNOSIS — N2581 Secondary hyperparathyroidism of renal origin: Secondary | ICD-10-CM | POA: Diagnosis not present

## 2021-11-28 DIAGNOSIS — K7460 Unspecified cirrhosis of liver: Secondary | ICD-10-CM | POA: Diagnosis not present

## 2021-11-28 DIAGNOSIS — D649 Anemia, unspecified: Secondary | ICD-10-CM | POA: Diagnosis not present

## 2021-11-29 DIAGNOSIS — N186 End stage renal disease: Secondary | ICD-10-CM | POA: Diagnosis not present

## 2021-11-29 DIAGNOSIS — I868 Varicose veins of other specified sites: Secondary | ICD-10-CM | POA: Diagnosis not present

## 2021-11-29 DIAGNOSIS — I10 Essential (primary) hypertension: Secondary | ICD-10-CM | POA: Diagnosis not present

## 2021-11-29 DIAGNOSIS — K7460 Unspecified cirrhosis of liver: Secondary | ICD-10-CM | POA: Diagnosis not present

## 2021-11-30 DIAGNOSIS — N186 End stage renal disease: Secondary | ICD-10-CM | POA: Diagnosis not present

## 2021-11-30 DIAGNOSIS — Z992 Dependence on renal dialysis: Secondary | ICD-10-CM | POA: Diagnosis not present

## 2021-11-30 DIAGNOSIS — D631 Anemia in chronic kidney disease: Secondary | ICD-10-CM | POA: Diagnosis not present

## 2021-11-30 DIAGNOSIS — N2581 Secondary hyperparathyroidism of renal origin: Secondary | ICD-10-CM | POA: Diagnosis not present

## 2021-12-03 DIAGNOSIS — D631 Anemia in chronic kidney disease: Secondary | ICD-10-CM | POA: Diagnosis not present

## 2021-12-03 DIAGNOSIS — N186 End stage renal disease: Secondary | ICD-10-CM | POA: Diagnosis not present

## 2021-12-03 DIAGNOSIS — Z992 Dependence on renal dialysis: Secondary | ICD-10-CM | POA: Diagnosis not present

## 2021-12-03 DIAGNOSIS — N2581 Secondary hyperparathyroidism of renal origin: Secondary | ICD-10-CM | POA: Diagnosis not present

## 2021-12-04 DIAGNOSIS — K7460 Unspecified cirrhosis of liver: Secondary | ICD-10-CM | POA: Diagnosis not present

## 2021-12-04 DIAGNOSIS — N186 End stage renal disease: Secondary | ICD-10-CM | POA: Diagnosis not present

## 2021-12-04 DIAGNOSIS — N135 Crossing vessel and stricture of ureter without hydronephrosis: Secondary | ICD-10-CM | POA: Diagnosis not present

## 2021-12-04 DIAGNOSIS — Z992 Dependence on renal dialysis: Secondary | ICD-10-CM | POA: Diagnosis not present

## 2021-12-04 DIAGNOSIS — Z131 Encounter for screening for diabetes mellitus: Secondary | ICD-10-CM | POA: Diagnosis not present

## 2021-12-04 DIAGNOSIS — K920 Hematemesis: Secondary | ICD-10-CM | POA: Diagnosis not present

## 2021-12-04 DIAGNOSIS — E271 Primary adrenocortical insufficiency: Secondary | ICD-10-CM | POA: Diagnosis not present

## 2021-12-04 DIAGNOSIS — Z113 Encounter for screening for infections with a predominantly sexual mode of transmission: Secondary | ICD-10-CM | POA: Diagnosis not present

## 2021-12-05 DIAGNOSIS — Z992 Dependence on renal dialysis: Secondary | ICD-10-CM | POA: Diagnosis not present

## 2021-12-05 DIAGNOSIS — D509 Iron deficiency anemia, unspecified: Secondary | ICD-10-CM | POA: Diagnosis not present

## 2021-12-05 DIAGNOSIS — N186 End stage renal disease: Secondary | ICD-10-CM | POA: Diagnosis not present

## 2021-12-05 DIAGNOSIS — D631 Anemia in chronic kidney disease: Secondary | ICD-10-CM | POA: Diagnosis not present

## 2021-12-05 DIAGNOSIS — N2581 Secondary hyperparathyroidism of renal origin: Secondary | ICD-10-CM | POA: Diagnosis not present

## 2021-12-07 DIAGNOSIS — N2581 Secondary hyperparathyroidism of renal origin: Secondary | ICD-10-CM | POA: Diagnosis not present

## 2021-12-07 DIAGNOSIS — N186 End stage renal disease: Secondary | ICD-10-CM | POA: Diagnosis not present

## 2021-12-07 DIAGNOSIS — D509 Iron deficiency anemia, unspecified: Secondary | ICD-10-CM | POA: Diagnosis not present

## 2021-12-07 DIAGNOSIS — D631 Anemia in chronic kidney disease: Secondary | ICD-10-CM | POA: Diagnosis not present

## 2021-12-07 DIAGNOSIS — Z992 Dependence on renal dialysis: Secondary | ICD-10-CM | POA: Diagnosis not present

## 2021-12-10 DIAGNOSIS — N2581 Secondary hyperparathyroidism of renal origin: Secondary | ICD-10-CM | POA: Diagnosis not present

## 2021-12-10 DIAGNOSIS — Z992 Dependence on renal dialysis: Secondary | ICD-10-CM | POA: Diagnosis not present

## 2021-12-10 DIAGNOSIS — D509 Iron deficiency anemia, unspecified: Secondary | ICD-10-CM | POA: Diagnosis not present

## 2021-12-10 DIAGNOSIS — N186 End stage renal disease: Secondary | ICD-10-CM | POA: Diagnosis not present

## 2021-12-10 DIAGNOSIS — D631 Anemia in chronic kidney disease: Secondary | ICD-10-CM | POA: Diagnosis not present

## 2021-12-12 DIAGNOSIS — D509 Iron deficiency anemia, unspecified: Secondary | ICD-10-CM | POA: Diagnosis not present

## 2021-12-12 DIAGNOSIS — Z992 Dependence on renal dialysis: Secondary | ICD-10-CM | POA: Diagnosis not present

## 2021-12-12 DIAGNOSIS — D631 Anemia in chronic kidney disease: Secondary | ICD-10-CM | POA: Diagnosis not present

## 2021-12-12 DIAGNOSIS — N2581 Secondary hyperparathyroidism of renal origin: Secondary | ICD-10-CM | POA: Diagnosis not present

## 2021-12-12 DIAGNOSIS — N186 End stage renal disease: Secondary | ICD-10-CM | POA: Diagnosis not present

## 2021-12-13 DIAGNOSIS — Z992 Dependence on renal dialysis: Secondary | ICD-10-CM | POA: Diagnosis not present

## 2021-12-13 DIAGNOSIS — N186 End stage renal disease: Secondary | ICD-10-CM | POA: Diagnosis not present

## 2021-12-13 DIAGNOSIS — F17200 Nicotine dependence, unspecified, uncomplicated: Secondary | ICD-10-CM | POA: Diagnosis not present

## 2021-12-13 DIAGNOSIS — Z5982 Transportation insecurity: Secondary | ICD-10-CM | POA: Diagnosis not present

## 2021-12-13 DIAGNOSIS — R262 Difficulty in walking, not elsewhere classified: Secondary | ICD-10-CM | POA: Diagnosis not present

## 2021-12-13 DIAGNOSIS — Z9981 Dependence on supplemental oxygen: Secondary | ICD-10-CM | POA: Diagnosis not present

## 2021-12-13 DIAGNOSIS — R590 Localized enlarged lymph nodes: Secondary | ICD-10-CM | POA: Diagnosis not present

## 2021-12-13 DIAGNOSIS — Z0001 Encounter for general adult medical examination with abnormal findings: Secondary | ICD-10-CM | POA: Diagnosis not present

## 2021-12-13 DIAGNOSIS — R5383 Other fatigue: Secondary | ICD-10-CM | POA: Diagnosis not present

## 2021-12-13 DIAGNOSIS — Z9181 History of falling: Secondary | ICD-10-CM | POA: Diagnosis not present

## 2021-12-13 DIAGNOSIS — K7460 Unspecified cirrhosis of liver: Secondary | ICD-10-CM | POA: Diagnosis not present

## 2021-12-13 DIAGNOSIS — Z742 Need for assistance at home and no other household member able to render care: Secondary | ICD-10-CM | POA: Diagnosis not present

## 2021-12-13 DIAGNOSIS — R2689 Other abnormalities of gait and mobility: Secondary | ICD-10-CM | POA: Diagnosis not present

## 2021-12-13 DIAGNOSIS — Z9989 Dependence on other enabling machines and devices: Secondary | ICD-10-CM | POA: Diagnosis not present

## 2021-12-13 DIAGNOSIS — I10 Essential (primary) hypertension: Secondary | ICD-10-CM | POA: Diagnosis not present

## 2021-12-14 DIAGNOSIS — N186 End stage renal disease: Secondary | ICD-10-CM | POA: Diagnosis not present

## 2021-12-14 DIAGNOSIS — N2581 Secondary hyperparathyroidism of renal origin: Secondary | ICD-10-CM | POA: Diagnosis not present

## 2021-12-14 DIAGNOSIS — D631 Anemia in chronic kidney disease: Secondary | ICD-10-CM | POA: Diagnosis not present

## 2021-12-14 DIAGNOSIS — Z992 Dependence on renal dialysis: Secondary | ICD-10-CM | POA: Diagnosis not present

## 2021-12-14 DIAGNOSIS — D509 Iron deficiency anemia, unspecified: Secondary | ICD-10-CM | POA: Diagnosis not present

## 2021-12-17 DIAGNOSIS — D631 Anemia in chronic kidney disease: Secondary | ICD-10-CM | POA: Diagnosis not present

## 2021-12-17 DIAGNOSIS — N186 End stage renal disease: Secondary | ICD-10-CM | POA: Diagnosis not present

## 2021-12-17 DIAGNOSIS — D509 Iron deficiency anemia, unspecified: Secondary | ICD-10-CM | POA: Diagnosis not present

## 2021-12-17 DIAGNOSIS — N2581 Secondary hyperparathyroidism of renal origin: Secondary | ICD-10-CM | POA: Diagnosis not present

## 2021-12-17 DIAGNOSIS — Z992 Dependence on renal dialysis: Secondary | ICD-10-CM | POA: Diagnosis not present

## 2021-12-20 DIAGNOSIS — R590 Localized enlarged lymph nodes: Secondary | ICD-10-CM | POA: Diagnosis not present

## 2021-12-21 DIAGNOSIS — Z992 Dependence on renal dialysis: Secondary | ICD-10-CM | POA: Diagnosis not present

## 2021-12-21 DIAGNOSIS — N2581 Secondary hyperparathyroidism of renal origin: Secondary | ICD-10-CM | POA: Diagnosis not present

## 2021-12-21 DIAGNOSIS — D631 Anemia in chronic kidney disease: Secondary | ICD-10-CM | POA: Diagnosis not present

## 2021-12-21 DIAGNOSIS — D509 Iron deficiency anemia, unspecified: Secondary | ICD-10-CM | POA: Diagnosis not present

## 2021-12-21 DIAGNOSIS — N186 End stage renal disease: Secondary | ICD-10-CM | POA: Diagnosis not present

## 2021-12-24 DIAGNOSIS — Z992 Dependence on renal dialysis: Secondary | ICD-10-CM | POA: Diagnosis not present

## 2021-12-24 DIAGNOSIS — D631 Anemia in chronic kidney disease: Secondary | ICD-10-CM | POA: Diagnosis not present

## 2021-12-24 DIAGNOSIS — N2581 Secondary hyperparathyroidism of renal origin: Secondary | ICD-10-CM | POA: Diagnosis not present

## 2021-12-24 DIAGNOSIS — D509 Iron deficiency anemia, unspecified: Secondary | ICD-10-CM | POA: Diagnosis not present

## 2021-12-24 DIAGNOSIS — N186 End stage renal disease: Secondary | ICD-10-CM | POA: Diagnosis not present

## 2021-12-25 DIAGNOSIS — Z748 Other problems related to care provider dependency: Secondary | ICD-10-CM | POA: Diagnosis not present

## 2021-12-25 DIAGNOSIS — Z5982 Transportation insecurity: Secondary | ICD-10-CM | POA: Diagnosis not present

## 2021-12-25 DIAGNOSIS — N186 End stage renal disease: Secondary | ICD-10-CM | POA: Diagnosis not present

## 2021-12-25 DIAGNOSIS — R590 Localized enlarged lymph nodes: Secondary | ICD-10-CM | POA: Diagnosis not present

## 2021-12-26 DIAGNOSIS — N186 End stage renal disease: Secondary | ICD-10-CM | POA: Diagnosis not present

## 2021-12-26 DIAGNOSIS — N2581 Secondary hyperparathyroidism of renal origin: Secondary | ICD-10-CM | POA: Diagnosis not present

## 2021-12-26 DIAGNOSIS — D509 Iron deficiency anemia, unspecified: Secondary | ICD-10-CM | POA: Diagnosis not present

## 2021-12-26 DIAGNOSIS — Z992 Dependence on renal dialysis: Secondary | ICD-10-CM | POA: Diagnosis not present

## 2021-12-26 DIAGNOSIS — D631 Anemia in chronic kidney disease: Secondary | ICD-10-CM | POA: Diagnosis not present

## 2021-12-28 DIAGNOSIS — D631 Anemia in chronic kidney disease: Secondary | ICD-10-CM | POA: Diagnosis not present

## 2021-12-28 DIAGNOSIS — D509 Iron deficiency anemia, unspecified: Secondary | ICD-10-CM | POA: Diagnosis not present

## 2021-12-28 DIAGNOSIS — N186 End stage renal disease: Secondary | ICD-10-CM | POA: Diagnosis not present

## 2021-12-28 DIAGNOSIS — N2581 Secondary hyperparathyroidism of renal origin: Secondary | ICD-10-CM | POA: Diagnosis not present

## 2021-12-28 DIAGNOSIS — Z992 Dependence on renal dialysis: Secondary | ICD-10-CM | POA: Diagnosis not present

## 2021-12-31 DIAGNOSIS — D631 Anemia in chronic kidney disease: Secondary | ICD-10-CM | POA: Diagnosis not present

## 2021-12-31 DIAGNOSIS — D509 Iron deficiency anemia, unspecified: Secondary | ICD-10-CM | POA: Diagnosis not present

## 2021-12-31 DIAGNOSIS — N2581 Secondary hyperparathyroidism of renal origin: Secondary | ICD-10-CM | POA: Diagnosis not present

## 2021-12-31 DIAGNOSIS — Z992 Dependence on renal dialysis: Secondary | ICD-10-CM | POA: Diagnosis not present

## 2021-12-31 DIAGNOSIS — N186 End stage renal disease: Secondary | ICD-10-CM | POA: Diagnosis not present
